# Patient Record
Sex: Male | Born: 1950
Health system: Southern US, Community
[De-identification: ages and names within clinical notes are randomized; demographics above are authoritative.]

## PROBLEM LIST (undated history)

## (undated) DIAGNOSIS — I10 Essential (primary) hypertension: Secondary | ICD-10-CM

## (undated) DIAGNOSIS — I712 Thoracic aortic aneurysm, without rupture, unspecified: Secondary | ICD-10-CM

## (undated) DIAGNOSIS — I38 Endocarditis, valve unspecified: Secondary | ICD-10-CM

## (undated) DIAGNOSIS — R509 Fever, unspecified: Secondary | ICD-10-CM

## (undated) DIAGNOSIS — F32A Depression, unspecified: Secondary | ICD-10-CM

## (undated) DIAGNOSIS — F419 Anxiety disorder, unspecified: Secondary | ICD-10-CM

## (undated) DIAGNOSIS — R0600 Dyspnea, unspecified: Secondary | ICD-10-CM

## (undated) DIAGNOSIS — I219 Acute myocardial infarction, unspecified: Secondary | ICD-10-CM

## (undated) DIAGNOSIS — G4730 Sleep apnea, unspecified: Secondary | ICD-10-CM

## (undated) DIAGNOSIS — I2510 Atherosclerotic heart disease of native coronary artery without angina pectoris: Secondary | ICD-10-CM

## (undated) DIAGNOSIS — N289 Disorder of kidney and ureter, unspecified: Secondary | ICD-10-CM

## (undated) DIAGNOSIS — E119 Type 2 diabetes mellitus without complications: Secondary | ICD-10-CM

## (undated) DIAGNOSIS — M1990 Unspecified osteoarthritis, unspecified site: Secondary | ICD-10-CM

## (undated) DIAGNOSIS — I6529 Occlusion and stenosis of unspecified carotid artery: Secondary | ICD-10-CM

## (undated) HISTORY — DX: Endocarditis, valve unspecified: I38

## (undated) HISTORY — PX: CARDIAC CATHETERIZATION: SHX172

## (undated) HISTORY — PX: NECK SURGERY: SHX720

## (undated) HISTORY — DX: Fever, unspecified: R50.9

## (undated) HISTORY — PX: APPENDECTOMY: SHX54

## (undated) HISTORY — PX: CORONARY ANGIOPLASTY: SHX604

## (undated) HISTORY — PX: ABDOMINAL AORTIC ANEURYSM REPAIR: SUR1152

## (undated) HISTORY — PX: SMALL INTESTINE SURGERY: SHX150

---

## 2002-10-03 ENCOUNTER — Encounter: Payer: Self-pay | Admitting: Neurosurgery

## 2002-10-05 ENCOUNTER — Encounter: Payer: Self-pay | Admitting: Neurosurgery

## 2002-10-05 ENCOUNTER — Inpatient Hospital Stay (HOSPITAL_COMMUNITY): Admission: RE | Admit: 2002-10-05 | Discharge: 2002-10-06 | Payer: Self-pay | Admitting: Neurosurgery

## 2009-06-20 ENCOUNTER — Ambulatory Visit (HOSPITAL_COMMUNITY): Admission: RE | Admit: 2009-06-20 | Discharge: 2009-06-21 | Payer: Self-pay | Admitting: Neurosurgery

## 2011-03-01 LAB — BASIC METABOLIC PANEL
BUN: 41 mg/dL — ABNORMAL HIGH (ref 6–23)
BUN: 42 mg/dL — ABNORMAL HIGH (ref 6–23)
CO2: 26 mEq/L (ref 19–32)
Calcium: 9.5 mg/dL (ref 8.4–10.5)
Chloride: 102 mEq/L (ref 96–112)
Chloride: 106 mEq/L (ref 96–112)
Creatinine, Ser: 2.77 mg/dL — ABNORMAL HIGH (ref 0.4–1.5)
GFR calc Af Amer: 29 mL/min — ABNORMAL LOW (ref >60)
GFR calc non Af Amer: 24 mL/min — ABNORMAL LOW (ref >60)
GFR calc non Af Amer: 24 mL/min — ABNORMAL LOW (ref >60)
Glucose, Bld: 116 mg/dL — ABNORMAL HIGH (ref 70–99)
Glucose, Bld: 309 mg/dL — ABNORMAL HIGH (ref 70–99)
Potassium: 5 mEq/L (ref 3.5–5.1)
Potassium: 5.3 mEq/L — ABNORMAL HIGH (ref 3.5–5.1)
Sodium: 136 mEq/L (ref 135–145)
Sodium: 137 mEq/L (ref 135–145)

## 2011-03-01 LAB — GLUCOSE, CAPILLARY
Glucose-Capillary: 125 mg/dL — ABNORMAL HIGH (ref 70–99)
Glucose-Capillary: 155 mg/dL — ABNORMAL HIGH (ref 70–99)
Glucose-Capillary: 162 mg/dL — ABNORMAL HIGH (ref 70–99)
Glucose-Capillary: 177 mg/dL — ABNORMAL HIGH (ref 70–99)

## 2011-03-01 LAB — CBC
HCT: 42.7 % (ref 39.0–52.0)
Hemoglobin: 14.6 g/dL (ref 13.0–17.0)
MCHC: 34.3 g/dL (ref 30.0–36.0)
MCV: 89.3 fL (ref 78.0–100.0)
Platelets: 202 10*3/uL (ref 150–400)
RBC: 4.78 MIL/uL (ref 4.22–5.81)
RDW: 13 % (ref 11.5–15.5)
WBC: 9.2 10*3/uL (ref 4.0–10.5)

## 2011-03-01 LAB — PROTIME-INR: INR: 0.9 (ref 0.00–1.49)

## 2011-04-07 NOTE — Op Note (Signed)
NAME:  John [name], John [name]             ACCOUNT NO.:  [id number]   MEDICAL RECORD NO.:  [id number]          PATIENT TYPE:  OIB   LOCATION:  3536                         FACILITY:  [facility]   PHYSICIAN:  Otilio Connors, M.D.  DATE OF BIRTH:  [birth year]   DATE OF PROCEDURE:  06/20/2009  DATE OF DISCHARGE:                               OPERATIVE REPORT   PREOPERATIVE DIAGNOSIS:  Herniated nucleus pulposus right 3-4 with large  extruded fragments going cephalad in the canal up under the upper disk.   POSTOPERATIVE DIAGNOSIS:  Herniated nucleus pulposus right 3-4 with  large extruded fragments going cephalad in the canal up under the upper  disk.   PROCEDURES:  Decompressive laminectomy decompressing the L3 and L4 roots  on the right (two levels), diskectomy, microdissection with microscope.   SURGEON:  Otilio Connors, MD   ASSISTANT:  Ophelia Charter, MD   ANESTHESIA:  General endotracheal tube anesthesia.   ESTIMATED BLOOD LOSS:  Minimal.   BLOOD GIVEN:  None.   DRAINS:  None.   COMPLICATIONS:  None.   INDICATIONS FOR PROCEDURE:  The patient is a 60 year old gentleman who  presents with severe back and leg pain, numbness, and weakness with  weakness in the right quadriceps and sensation diminished in the 4  distribution and potentially in the 3, with a lot of pain in 3  distribution.  An MRI was done showing large extruded fragments of disk  herniation from 3-4 levels going cephalad into the foramen and up under  the nerve root all the way up to the pedicle, and the patient brought in  for decompressive laminectomy and diskectomy.   PROCEDURE IN DETAIL:  The patient was brought into the operating room  and general anesthesia was induced.  The patient was placed in prone  position on the Wilson frame with all pressure points padded.  The  patient was prepped and draped in sterile fashion.  Site of incision  injected with 10 mL of 1% lidocaine with epinephrine.  A needle was  placed in the interspace.  X-ray was obtained showing the needle was  pointing at 3-4 interspace and at the 3 spinous process.  An incision  then made centered where the needle was, and incision then taken down  the fascia.  Hemostasis was obtained with Bovie cauterization.  The  fascia was incised and subperiosteal dissection was done over the L3 and  L4 spinous processes and lamina out to the facets.  A self-retaining  retractor was placed and markers were placed in the interspace.  Another  x-ray was obtained ensuring this was at the 3-4 interspace.  I started a  semihemilaminectomy with a high-speed drill and then the Kerrison  punches removing the lamina of 3 and taking it a little higher than the  usual up the lamina.  There was a __________ extending at disk  herniation.  Medial facetectomy was performed and foraminotomy over the  4 roots.  We then explored the dura and the epidural space, found the  disk.  Got hemostasis with bipolar cauterization, Gelfoam, thrombin, and  explored the area.  There was a hole into the annulus out laterally and  we entered that to remove some disk pieces and fragments out laterally  in the disk space, but this was not what we were seeing in the MRI.  We  continued to explore.  I could feel the upper nerve root and it felt a  little tight.  We could put the marker in the interspace.  We took  another x-ray and this did confirm we were at the 3-4 interspace.  We  extended our laminectomy more cephalad which was over the nerve root 3  and as we did this, we could see there was a nerve root was under  compression here.  We were able to feel underneath it with nerve hook  and to pull out a couple of large fragments of disk that were up against  the pedicle and up under the 3 roots causing some significant  compression.  Once we did that, we got significant decompression of 3  nerve roots.  A couple of other fragments that we were able to fish out  and get  the pressure off.  We then continued exploring the foramen and  following the 3 nerve root out and working our way down toward the disk  space removing further fragments.  When we were finished, we had good  decompressive laminectomy decompressing the 3 and 4 roots at the  diskectomy removing the free fragments and the disk at 3-4 area and a  good decompression of 3 and 4 nerve roots on the right side.  Hemostasis  was obtained with Gelfoam, thrombin, and bipolar cauterization.  Gelfoam  was irrigated out.  We had good hemostasis, and after we irrigated with  antibiotic solution, retractors were removed.  Fascia closed with 0  Vicryl interrupted sutures, subcutaneous tissue closed with 0, 2-0, and  3-0 Vicryl interrupted sutures.  Skin closed with benzoin and Steri-  Strips.  A dressing was placed.  The patient was placed back into supine  position, awoken from anesthesia, and transferred to recovery room in  stable condition.           ______________________________  Otilio Connors, M.D.     JRH/MEDQ  D:  06/20/2009  T:  06/21/2009  Job:  [id number]

## 2011-04-10 NOTE — Op Note (Signed)
NAME:  John [name], John [name]                         ACCOUNT NO.:  [id number]   MEDICAL RECORD NO.:  [id number]                   PATIENT TYPE:  INP   LOCATION:  NA                                   FACILITY:  [facility]   PHYSICIAN:  Otilio Connors, M.D.               DATE OF BIRTH:  [birth year]   DATE OF PROCEDURE:  10/05/2002  DATE OF DISCHARGE:                                 OPERATIVE REPORT   PREOPERATIVE DIAGNOSES:  Herniated nucleus pulposus C5-6, left-sided  radiculopathy.   POSTOPERATIVE DIAGNOSES:  Herniated nucleus pulposus C5-6, left-sided  radiculopathy.   OPERATION PERFORMED:  Anterior cervical decompression and diskectomy and  fusion C6-7 with BAK interbody cage and autograft same incision.   SURGEON:  Otilio Connors, M.D.   ASSISTANT:  Ophelia Charter, M.D.   ANESTHESIA:  General endotracheal.   ESTIMATED BLOOD LOSS:  Minimal.   BLOOD REPLACED:  None.   DRAINS:  None.   COMPLICATIONS:  None.   INDICATIONS FOR PROCEDURE:  The patient is a 60 year old gentleman who has  been having neck and left arm pain and numbness.  He can bring these  symptoms easily by tilting his head back and then to the left.  Some of the  severity of the pain has been helped a little bit with steroid dose-pack,  antiinflammatory medications but symptoms persist.  MRI was done showing  large left sided disk herniation at C6-7 with some multiple mild spondylosis  at these levels. He does have some slight weakness in the left triceps and  finger extension which has worsened over some time with decreased sensation  left C7 distribution.  Patient brought in for diskectomy and decompression  and fusion.   DESCRIPTION OF PROCEDURE:  The patient was brought to the operating room and  general anesthesia induced.  Patient was placed in halter traction with 10  pounds and prepped and draped in sterile fashion.  Site of incision was  injected with 10 cc of 1% lidocaine with epinephrine.   Incision was made  from the midline to the anterior border of the sternocleidomastoid muscle on  the left side of the neck.  Incision carried down to the platysma.  Hemostasis obtained with Bovie cauterization.  The platysma was incised with  the Bovie and blunt dissection was taken through the anterior cervical  fascia to the anterior cervical spine.  Disk space was found.  Needle was  placed in the disk space and fluoroscopy was used and it was hard to see  where the  needle was; but it appeared to be going in at the 6-7 level.  We  placed a second needle in the 5-6 level which we could see with fluoroscopic  imaging and coming down the first needle was the 5-6 and the next needle was  one level down and was at 6-7.  We removed the 5-6 needle.  We incised the  6-  7 disk space and then removed that needle and did a partial diskectomy with  pituitary rongeurs.  We then mobilized the longus colli muscle on each side  with the Bovie and placed self-retaining retractor system.  Then placed  distraction pins into the C6 and 7 and distracted the 6-7 interspace.  We  then completed the diskectomy using pituitary rongeurs, curets, and 1 and 2  mm Kerrison punches removing a free fragment of disk that was midline out to  the left foramen and then even fairly far out.  We removed the posterior  longitudinal ligament and did foraminotomies bilaterally and again made sure  pressure was off the bilateral roots especially on the left side and then we  had good decompression.  We used a high speed drill to remove the  cartilaginous end plates.  We then measured the disk space.  It was 9 mm so  we put the 9 mm drill guide down this space for a 12 mm cage and this was  drilled.  We tapped that into place.  We made sure we had good bone on each  side and then we used the reamer and then reamed the interspace checking  depth down to 17 mm.  All the bone from the reaming was saved and placed  into a 12 mm BAK  cage for autograft fusion.  We then tapped the interspace  and made sure we had good grooves in the bones on both sides and good lip on  each side which we did and then we screwed the BAK cage into position.  We  removed the drill guide and then put the cage in its final position about 1  mm countersunk. The wound was irrigated with antibiotic solution, and we  removed the distraction pins and the retractor up to the distraction and the  traction was removed prior to tapping and placing of the cage.  The cage was  firmly in place.  Hemostasis was obtained with Gelfoam thrombin and bipolar  cauterization.  Wound was irrigated with antibiotic solution.  We used  fluoroscopic imaging to confirm that the cage was in the 6-7 level.  It was  hard to see because of the shoulders but in an oblique view we were able  tamp out and then show that.  We had good hemostasis and the platysma was  then closed with 3-0 Vicryl interrupted suture.  The subcutaneous tissues  were closed with the same and skin closed with Benzoin and Steri-Strips.  Dressing was placed. A collar placed over the neck.  The patient was then  awakened from anesthesia and transferred to recovery room in stable  condition.                                               Otilio Connors, M.D.    JRH/MEDQ  D:  10/05/2002  T:  10/05/2002  Job:  [id number]

## 2017-08-19 ENCOUNTER — Other Ambulatory Visit: Payer: Self-pay | Admitting: Neurosurgery

## 2017-08-19 DIAGNOSIS — Z981 Arthrodesis status: Secondary | ICD-10-CM

## 2017-09-06 ENCOUNTER — Encounter (HOSPITAL_COMMUNITY): Payer: Self-pay

## 2017-09-06 ENCOUNTER — Inpatient Hospital Stay (HOSPITAL_COMMUNITY)
Admission: EM | Admit: 2017-09-06 | Discharge: 2017-09-14 | DRG: 289 | Disposition: A | Discharge status: Home Health | Payer: 59 | Attending: Internal Medicine | Admitting: Internal Medicine

## 2017-09-06 ENCOUNTER — Emergency Department (HOSPITAL_COMMUNITY): Payer: 59

## 2017-09-06 DIAGNOSIS — Z79899 Other long term (current) drug therapy: Secondary | ICD-10-CM

## 2017-09-06 DIAGNOSIS — N186 End stage renal disease: Secondary | ICD-10-CM | POA: Diagnosis present

## 2017-09-06 DIAGNOSIS — K5900 Constipation, unspecified: Secondary | ICD-10-CM | POA: Diagnosis present

## 2017-09-06 DIAGNOSIS — R7881 Bacteremia: Secondary | ICD-10-CM | POA: Diagnosis present

## 2017-09-06 DIAGNOSIS — E1129 Type 2 diabetes mellitus with other diabetic kidney complication: Secondary | ICD-10-CM | POA: Diagnosis present

## 2017-09-06 DIAGNOSIS — M5126 Other intervertebral disc displacement, lumbar region: Secondary | ICD-10-CM | POA: Diagnosis present

## 2017-09-06 DIAGNOSIS — M545 Low back pain, unspecified: Secondary | ICD-10-CM | POA: Diagnosis present

## 2017-09-06 DIAGNOSIS — I330 Acute and subacute infective endocarditis: Secondary | ICD-10-CM | POA: Diagnosis not present

## 2017-09-06 DIAGNOSIS — E1122 Type 2 diabetes mellitus with diabetic chronic kidney disease: Secondary | ICD-10-CM | POA: Diagnosis present

## 2017-09-06 DIAGNOSIS — M549 Dorsalgia, unspecified: Secondary | ICD-10-CM | POA: Diagnosis not present

## 2017-09-06 DIAGNOSIS — I5189 Other ill-defined heart diseases: Secondary | ICD-10-CM | POA: Diagnosis present

## 2017-09-06 DIAGNOSIS — E1165 Type 2 diabetes mellitus with hyperglycemia: Secondary | ICD-10-CM | POA: Diagnosis present

## 2017-09-06 DIAGNOSIS — Z23 Encounter for immunization: Secondary | ICD-10-CM

## 2017-09-06 DIAGNOSIS — I714 Abdominal aortic aneurysm, without rupture: Secondary | ICD-10-CM | POA: Diagnosis present

## 2017-09-06 DIAGNOSIS — N179 Acute kidney failure, unspecified: Secondary | ICD-10-CM | POA: Diagnosis present

## 2017-09-06 DIAGNOSIS — M109 Gout, unspecified: Secondary | ICD-10-CM | POA: Diagnosis present

## 2017-09-06 DIAGNOSIS — R000 Tachycardia, unspecified: Secondary | ICD-10-CM | POA: Diagnosis present

## 2017-09-06 DIAGNOSIS — I700 Atherosclerosis of aorta: Secondary | ICD-10-CM | POA: Diagnosis present

## 2017-09-06 DIAGNOSIS — R509 Fever, unspecified: Secondary | ICD-10-CM

## 2017-09-06 DIAGNOSIS — I252 Old myocardial infarction: Secondary | ICD-10-CM

## 2017-09-06 DIAGNOSIS — N184 Chronic kidney disease, stage 4 (severe): Secondary | ICD-10-CM | POA: Diagnosis present

## 2017-09-06 DIAGNOSIS — Z981 Arthrodesis status: Secondary | ICD-10-CM

## 2017-09-06 DIAGNOSIS — E785 Hyperlipidemia, unspecified: Secondary | ICD-10-CM | POA: Diagnosis present

## 2017-09-06 DIAGNOSIS — B9561 Methicillin susceptible Staphylococcus aureus infection as the cause of diseases classified elsewhere: Secondary | ICD-10-CM | POA: Clinically undetermined

## 2017-09-06 DIAGNOSIS — M5442 Lumbago with sciatica, left side: Secondary | ICD-10-CM

## 2017-09-06 DIAGNOSIS — Z880 Allergy status to penicillin: Secondary | ICD-10-CM

## 2017-09-06 DIAGNOSIS — M4690 Unspecified inflammatory spondylopathy, site unspecified: Secondary | ICD-10-CM | POA: Diagnosis present

## 2017-09-06 DIAGNOSIS — M7138 Other bursal cyst, other site: Secondary | ICD-10-CM | POA: Diagnosis present

## 2017-09-06 DIAGNOSIS — I10 Essential (primary) hypertension: Secondary | ICD-10-CM | POA: Diagnosis present

## 2017-09-06 DIAGNOSIS — Z7984 Long term (current) use of oral hypoglycemic drugs: Secondary | ICD-10-CM

## 2017-09-06 DIAGNOSIS — I2510 Atherosclerotic heart disease of native coronary artery without angina pectoris: Secondary | ICD-10-CM | POA: Diagnosis present

## 2017-09-06 DIAGNOSIS — Z87891 Personal history of nicotine dependence: Secondary | ICD-10-CM

## 2017-09-06 DIAGNOSIS — I080 Rheumatic disorders of both mitral and aortic valves: Secondary | ICD-10-CM | POA: Diagnosis present

## 2017-09-06 DIAGNOSIS — Z955 Presence of coronary angioplasty implant and graft: Secondary | ICD-10-CM

## 2017-09-06 DIAGNOSIS — I129 Hypertensive chronic kidney disease with stage 1 through stage 4 chronic kidney disease, or unspecified chronic kidney disease: Secondary | ICD-10-CM | POA: Diagnosis present

## 2017-09-06 DIAGNOSIS — N189 Chronic kidney disease, unspecified: Secondary | ICD-10-CM

## 2017-09-06 HISTORY — DX: Disorder of kidney and ureter, unspecified: N28.9

## 2017-09-06 HISTORY — DX: Type 2 diabetes mellitus without complications: E11.9

## 2017-09-06 HISTORY — DX: Essential (primary) hypertension: I10

## 2017-09-06 HISTORY — DX: Acute myocardial infarction, unspecified: I21.9

## 2017-09-06 LAB — COMPREHENSIVE METABOLIC PANEL
ALBUMIN: 3.5 g/dL (ref 3.5–5.0)
ALT: 20 U/L (ref 17–63)
ANION GAP: 12 (ref 5–15)
AST: 15 U/L (ref 15–41)
Alkaline Phosphatase: 80 U/L (ref 38–126)
BILIRUBIN TOTAL: 0.6 mg/dL (ref 0.3–1.2)
BUN: 48 mg/dL — AB (ref 6–20)
CHLORIDE: 98 mmol/L — AB (ref 101–111)
CO2: 21 mmol/L — ABNORMAL LOW (ref 22–32)
Calcium: 9.2 mg/dL (ref 8.9–10.3)
Creatinine, Ser: 4.02 mg/dL — ABNORMAL HIGH (ref 0.61–1.24)
GFR calc Af Amer: 17 mL/min — ABNORMAL LOW (ref >60)
GFR, EST NON AFRICAN AMERICAN: 15 mL/min — AB (ref >60)
GLUCOSE: 193 mg/dL — AB (ref 65–99)
POTASSIUM: 4.2 mmol/L (ref 3.5–5.1)
Sodium: 131 mmol/L — ABNORMAL LOW (ref 135–145)
TOTAL PROTEIN: 7.3 g/dL (ref 6.5–8.1)

## 2017-09-06 LAB — CBC WITH DIFFERENTIAL/PLATELET
BASOS ABS: 0 10*3/uL (ref 0.0–0.1)
BASOS PCT: 0 %
Eosinophils Absolute: 0 10*3/uL (ref 0.0–0.7)
Eosinophils Relative: 0 %
HEMATOCRIT: 38.7 % — AB (ref 39.0–52.0)
Hemoglobin: 13.2 g/dL (ref 13.0–17.0)
LYMPHS PCT: 10 %
Lymphs Abs: 1.2 10*3/uL (ref 0.7–4.0)
MCH: 30.5 pg (ref 26.0–34.0)
MCHC: 34.1 g/dL (ref 30.0–36.0)
MCV: 89.4 fL (ref 78.0–100.0)
MONO ABS: 1.3 10*3/uL — AB (ref 0.1–1.0)
Monocytes Relative: 12 %
NEUTROS ABS: 8.8 10*3/uL — AB (ref 1.7–7.7)
NEUTROS PCT: 78 %
Platelets: 179 10*3/uL (ref 150–400)
RBC: 4.33 MIL/uL (ref 4.22–5.81)
RDW: 13.3 % (ref 11.5–15.5)
WBC: 11.3 10*3/uL — ABNORMAL HIGH (ref 4.0–10.5)

## 2017-09-06 LAB — URINALYSIS, ROUTINE W REFLEX MICROSCOPIC
Bacteria, UA: NONE SEEN
Bilirubin Urine: NEGATIVE
HGB URINE DIPSTICK: NEGATIVE
KETONES UR: NEGATIVE mg/dL
LEUKOCYTES UA: NEGATIVE
NITRITE: NEGATIVE
PROTEIN: 100 mg/dL — AB
Specific Gravity, Urine: 1.01 (ref 1.005–1.030)
pH: 6 (ref 5.0–8.0)

## 2017-09-06 LAB — C-REACTIVE PROTEIN: CRP: 18.7 mg/dL — ABNORMAL HIGH (ref <1.0)

## 2017-09-06 LAB — PROTIME-INR
INR: 1.02
Prothrombin Time: 13.3 seconds (ref 11.4–15.2)

## 2017-09-06 LAB — I-STAT CG4 LACTIC ACID, ED: LACTIC ACID, VENOUS: 1.02 mmol/L (ref 0.5–1.9)

## 2017-09-06 LAB — SEDIMENTATION RATE: SED RATE: 53 mm/h — AB (ref 0–16)

## 2017-09-06 MED ORDER — HYDROMORPHONE HCL 1 MG/ML IJ SOLN
0.5000 mg | Freq: Once | INTRAMUSCULAR | Status: AC
  Administered 2017-09-06: 0.5 mg via INTRAVENOUS
  Filled 2017-09-06: qty 1

## 2017-09-06 MED ORDER — ACETAMINOPHEN 325 MG PO TABS
650.0000 mg | ORAL_TABLET | Freq: Once | ORAL | Status: AC
  Administered 2017-09-06: 650 mg via ORAL
  Filled 2017-09-06: qty 2

## 2017-09-06 MED ORDER — ACETAMINOPHEN 325 MG PO TABS: 650.0000 mg | ORAL_TABLET | Freq: Once | ORAL | Status: DC | PRN

## 2017-09-06 MED ORDER — ONDANSETRON HCL 4 MG/2ML IJ SOLN
4.0000 mg | Freq: Once | INTRAMUSCULAR | Status: AC
  Administered 2017-09-06: 4 mg via INTRAVENOUS
  Filled 2017-09-06: qty 2

## 2017-09-06 MED ORDER — OXYCODONE-ACETAMINOPHEN 5-325 MG PO TABS
2.0000 | ORAL_TABLET | ORAL | Status: DC | PRN
  Administered 2017-09-06: 2 via ORAL

## 2017-09-06 MED ORDER — OXYCODONE-ACETAMINOPHEN 5-325 MG PO TABS
ORAL_TABLET | ORAL | Status: AC
  Filled 2017-09-06: qty 2

## 2017-09-06 NOTE — ED Triage Notes (Addendum)
Pt reports left sided lower back pain that radiates in left leg, denies any numbness tingling or loss of bowel of bladder. Pt has has surgery on back and upper neck. Lower back surgery was last sept.  Pt also reports renal failure.

## 2017-09-06 NOTE — ED Notes (Signed)
ED Provider at bedside. 

## 2017-09-06 NOTE — ED Notes (Signed)
Pt ambulated to the bathroom with 1x assist
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
## 2017-09-06 NOTE — ED Notes (Signed)
Patient transported to MRI. Pt states he woke up sweating after taking tylenol with fever breaking.

## 2017-09-06 NOTE — ED Notes (Signed)
edp made aware of temp.
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
## 2017-09-07 ENCOUNTER — Encounter (HOSPITAL_COMMUNITY): Payer: Self-pay | Admitting: Internal Medicine

## 2017-09-07 ENCOUNTER — Observation Stay (HOSPITAL_COMMUNITY): Payer: 59

## 2017-09-07 DIAGNOSIS — E1129 Type 2 diabetes mellitus with other diabetic kidney complication: Secondary | ICD-10-CM

## 2017-09-07 DIAGNOSIS — E785 Hyperlipidemia, unspecified: Secondary | ICD-10-CM | POA: Diagnosis present

## 2017-09-07 DIAGNOSIS — R509 Fever, unspecified: Secondary | ICD-10-CM | POA: Diagnosis not present

## 2017-09-07 DIAGNOSIS — E1121 Type 2 diabetes mellitus with diabetic nephropathy: Secondary | ICD-10-CM | POA: Diagnosis not present

## 2017-09-07 DIAGNOSIS — N189 Chronic kidney disease, unspecified: Secondary | ICD-10-CM

## 2017-09-07 DIAGNOSIS — N179 Acute kidney failure, unspecified: Secondary | ICD-10-CM | POA: Diagnosis present

## 2017-09-07 DIAGNOSIS — M549 Dorsalgia, unspecified: Secondary | ICD-10-CM

## 2017-09-07 DIAGNOSIS — N186 End stage renal disease: Secondary | ICD-10-CM | POA: Diagnosis present

## 2017-09-07 DIAGNOSIS — M545 Low back pain, unspecified: Secondary | ICD-10-CM | POA: Diagnosis present

## 2017-09-07 DIAGNOSIS — I10 Essential (primary) hypertension: Secondary | ICD-10-CM

## 2017-09-07 HISTORY — DX: Low back pain, unspecified: M54.50

## 2017-09-07 HISTORY — DX: End stage renal disease: N18.6

## 2017-09-07 HISTORY — DX: Type 2 diabetes mellitus with other diabetic kidney complication: E11.29

## 2017-09-07 HISTORY — DX: Dorsalgia, unspecified: M54.9

## 2017-09-07 HISTORY — DX: Essential (primary) hypertension: I10

## 2017-09-07 HISTORY — DX: Hyperlipidemia, unspecified: E78.5

## 2017-09-07 LAB — HIV ANTIBODY (ROUTINE TESTING W REFLEX): HIV Screen 4th Generation wRfx: NONREACTIVE

## 2017-09-07 LAB — URIC ACID: Uric Acid, Serum: 10.6 mg/dL — ABNORMAL HIGH (ref 4.4–7.6)

## 2017-09-07 LAB — BASIC METABOLIC PANEL
ANION GAP: 11 (ref 5–15)
BUN: 54 mg/dL — ABNORMAL HIGH (ref 6–20)
CO2: 21 mmol/L — AB (ref 22–32)
Calcium: 9 mg/dL (ref 8.9–10.3)
Chloride: 101 mmol/L (ref 101–111)
Creatinine, Ser: 4.15 mg/dL — ABNORMAL HIGH (ref 0.61–1.24)
GFR calc Af Amer: 16 mL/min — ABNORMAL LOW (ref >60)
GFR, EST NON AFRICAN AMERICAN: 14 mL/min — AB (ref >60)
GLUCOSE: 212 mg/dL — AB (ref 65–99)
POTASSIUM: 4.2 mmol/L (ref 3.5–5.1)
Sodium: 133 mmol/L — ABNORMAL LOW (ref 135–145)

## 2017-09-07 LAB — CK: CK TOTAL: 51 U/L (ref 49–397)

## 2017-09-07 LAB — INFLUENZA PANEL BY PCR (TYPE A & B)
Influenza A By PCR: NEGATIVE
Influenza B By PCR: NEGATIVE

## 2017-09-07 LAB — CBC
HEMATOCRIT: 36.6 % — AB (ref 39.0–52.0)
HEMOGLOBIN: 12 g/dL — AB (ref 13.0–17.0)
MCH: 29.3 pg (ref 26.0–34.0)
MCHC: 32.8 g/dL (ref 30.0–36.0)
MCV: 89.3 fL (ref 78.0–100.0)
Platelets: 162 10*3/uL (ref 150–400)
RBC: 4.1 MIL/uL — ABNORMAL LOW (ref 4.22–5.81)
RDW: 13.6 % (ref 11.5–15.5)
WBC: 8.5 10*3/uL (ref 4.0–10.5)

## 2017-09-07 MED ORDER — SODIUM CHLORIDE 0.9 % IV SOLN: INTRAVENOUS | Status: DC

## 2017-09-07 MED ORDER — ACETAMINOPHEN 650 MG RE SUPP: 650.0000 mg | Freq: Four times a day (QID) | RECTAL | Status: DC | PRN

## 2017-09-07 MED ORDER — LINAGLIPTIN 5 MG PO TABS
5.0000 mg | ORAL_TABLET | Freq: Every day | ORAL | Status: DC
  Administered 2017-09-07 – 2017-09-09 (×3): 5 mg via ORAL
  Filled 2017-09-07 (×4): qty 1

## 2017-09-07 MED ORDER — COLCHICINE 0.6 MG PO TABS
0.6000 mg | ORAL_TABLET | Freq: Every day | ORAL | Status: DC
  Administered 2017-09-07 – 2017-09-08 (×2): 0.6 mg via ORAL
  Filled 2017-09-07 (×3): qty 1

## 2017-09-07 MED ORDER — MORPHINE SULFATE (PF) 4 MG/ML IV SOLN
2.0000 mg | INTRAVENOUS | Status: DC | PRN
  Administered 2017-09-07 – 2017-09-12 (×5): 2 mg via INTRAVENOUS
  Filled 2017-09-07 (×5): qty 1

## 2017-09-07 MED ORDER — AMLODIPINE BESYLATE 5 MG PO TABS
5.0000 mg | ORAL_TABLET | Freq: Every day | ORAL | Status: DC
  Administered 2017-09-07 – 2017-09-14 (×7): 5 mg via ORAL
  Filled 2017-09-07 (×7): qty 1

## 2017-09-07 MED ORDER — ACETAMINOPHEN 325 MG PO TABS
650.0000 mg | ORAL_TABLET | Freq: Four times a day (QID) | ORAL | Status: DC | PRN
  Administered 2017-09-07: 650 mg via ORAL
  Filled 2017-09-07 (×2): qty 2

## 2017-09-07 MED ORDER — METOPROLOL SUCCINATE ER 50 MG PO TB24
50.0000 mg | ORAL_TABLET | Freq: Every day | ORAL | Status: DC
  Administered 2017-09-07 – 2017-09-14 (×7): 50 mg via ORAL
  Filled 2017-09-07: qty 1
  Filled 2017-09-07: qty 2
  Filled 2017-09-07 (×5): qty 1

## 2017-09-07 MED ORDER — INFLUENZA VAC SPLIT QUAD 0.5 ML IM SUSY
0.5000 mL | PREFILLED_SYRINGE | INTRAMUSCULAR | Status: AC
  Administered 2017-09-08: 0.5 mL via INTRAMUSCULAR
  Filled 2017-09-07: qty 0.5

## 2017-09-07 MED ORDER — ONDANSETRON HCL 4 MG PO TABS
4.0000 mg | ORAL_TABLET | Freq: Four times a day (QID) | ORAL | Status: DC | PRN
  Administered 2017-09-07: 4 mg via ORAL
  Filled 2017-09-07: qty 1

## 2017-09-07 MED ORDER — DULAGLUTIDE 1.5 MG/0.5ML ~~LOC~~ SOAJ: 1.5000 mg | SUBCUTANEOUS | Status: DC

## 2017-09-07 MED ORDER — TAMSULOSIN HCL 0.4 MG PO CAPS
0.4000 mg | ORAL_CAPSULE | Freq: Every day | ORAL | Status: DC
  Administered 2017-09-07 – 2017-09-13 (×7): 0.4 mg via ORAL
  Filled 2017-09-07 (×8): qty 1

## 2017-09-07 MED ORDER — ONDANSETRON HCL 4 MG/2ML IJ SOLN: 4.0000 mg | Freq: Four times a day (QID) | INTRAMUSCULAR | Status: DC | PRN

## 2017-09-07 MED ORDER — GABAPENTIN 300 MG PO CAPS
300.0000 mg | ORAL_CAPSULE | Freq: Three times a day (TID) | ORAL | Status: DC
  Administered 2017-09-07 – 2017-09-13 (×16): 300 mg via ORAL
  Filled 2017-09-07 (×18): qty 1

## 2017-09-07 MED ORDER — OXYCODONE-ACETAMINOPHEN 5-325 MG PO TABS
1.0000 | ORAL_TABLET | Freq: Four times a day (QID) | ORAL | Status: DC | PRN
  Administered 2017-09-07 – 2017-09-14 (×12): 1 via ORAL
  Filled 2017-09-07 (×13): qty 1

## 2017-09-07 MED ORDER — CALCITRIOL 0.25 MCG PO CAPS
0.5000 ug | ORAL_CAPSULE | Freq: Every day | ORAL | Status: DC
  Administered 2017-09-07 – 2017-09-14 (×7): 0.5 ug via ORAL
  Filled 2017-09-07 (×5): qty 2
  Filled 2017-09-07 (×2): qty 1
  Filled 2017-09-07: qty 2

## 2017-09-07 MED ORDER — ASPIRIN EC 81 MG PO TBEC
81.0000 mg | DELAYED_RELEASE_TABLET | Freq: Every day | ORAL | Status: DC
  Administered 2017-09-07 – 2017-09-14 (×7): 81 mg via ORAL
  Filled 2017-09-07 (×7): qty 1

## 2017-09-07 MED ORDER — SODIUM CHLORIDE 0.9 % IV SOLN
INTRAVENOUS | Status: AC
  Administered 2017-09-07: 14:00:00 via INTRAVENOUS

## 2017-09-07 MED ORDER — SIMVASTATIN 40 MG PO TABS
40.0000 mg | ORAL_TABLET | Freq: Every day | ORAL | Status: DC
  Administered 2017-09-07 – 2017-09-14 (×7): 40 mg via ORAL
  Filled 2017-09-07 (×7): qty 1

## 2017-09-07 MED ORDER — MORPHINE SULFATE (PF) 4 MG/ML IV SOLN
1.0000 mg | INTRAVENOUS | Status: DC | PRN
  Administered 2017-09-07 (×2): 1 mg via INTRAVENOUS
  Filled 2017-09-07 (×2): qty 1

## 2017-09-07 NOTE — ED Notes (Signed)
Pt assisted w/lying down on bed - blankets given as requested.

## 2017-09-07 NOTE — ED Notes (Addendum)
Pt sat up in bed to use urinal. Pt noted to be moaning during any movement. When asked location of pain, states "It's in my hip, I guess." Pt ate few bites of breakfast - states is unable to eat anymore d/t no appetite and food did not taste good. Offered to order another tray for pt, declined. Pt continues to moan and grimace. Pt aware of delay w/med-surg bed placement.

## 2017-09-07 NOTE — ED Notes (Signed)
Attempted to call report. Secretary advised RN will call back. 

## 2017-09-07 NOTE — Consult Note (Signed)
[name]         Reason for Consult: back pain   Referring Physician: Daleen Bo  Principal Problem:   Low back pain Active Problems:   Acute on chronic kidney failure (HCC)   DM (diabetes mellitus), type 2 with renal complications (HCC)   Essential hypertension   HLD (hyperlipidemia)   Back pain    HPI: John [name] is a 66 y.o. male  with history of CAD s/p pci, hypertension, diabetes mellitus type 2, chronic kidney disease cr 4.1-followed by Dr Florene Glen, also hx of cervical spine fusion and lumbar fusion roughly 8 years ago.  admitted on 10/15 with worsening low back pain x 5 days  After attempting to carry heavy glass table and strained his back.he also has started to develop fevers the following morning which has not subsided. And notices pain radiating down left leg. . No recent trauma or falls. No sick contacts. In the ED, his labs showed wbc of 11, and temp of 102F. He underwent mri of lumbar spine which showed disc extrusion at L4-L5, and edema at the paraspinal region. Interestingly he has history of gout, uric acid elevated at 10.6. Due to worsening back pain, he presented to the ED for evaluation.  Past Medical History:  Diagnosis Date  . Diabetes mellitus without complication ([place])   . Hypertension   . MI (myocardial infarction) ([place])   . Renal disorder     Allergies:  Allergies  Allergen Reactions  . Penicillins Rash    Has patient had a PCN reaction causing immediate rash, facial/tongue/throat swelling, SOB or lightheadedness with hypotension: Yes Has patient had a PCN reaction causing severe rash involving mucus membranes or skin necrosis: No Has patient had a PCN reaction that required hospitalization: No Has patient had a PCN reaction occurring within the last 10 years: No If all of the above answers are "NO", then may proceed with Cephalosporin use.    MEDICATIONS: . amLODipine  5 mg Oral Daily  . aspirin EC  81 mg Oral Daily    . calcitRIOL  0.5 mcg Oral Daily  . colchicine  0.6 mg Oral Daily  . [START ON 09/12/2017] Dulaglutide  1.5 mg Subcutaneous Q Sun  . gabapentin  300 mg Oral TID  . linagliptin  5 mg Oral Daily  . metoprolol succinate  50 mg Oral Daily  . simvastatin  40 mg Oral Daily  . tamsulosin  0.4 mg Oral QAC supper    Social History  Substance Use Topics  . Smoking status: Former Research scientist (life sciences)  . Smokeless tobacco: Never Used  . Alcohol use Yes     Comment: Occasionally.    Family History  Problem Relation Age of Onset  . Cancer Maternal Grandmother     Review of Systems  Constitutional: Negative for fever, chills, diaphoresis, activity change, appetite change, fatigue and unexpected weight change.  HENT: Negative for congestion, sore throat, rhinorrhea, sneezing, trouble swallowing and sinus pressure.  Eyes: Negative for photophobia and visual disturbance.  Respiratory: Negative for cough, chest tightness, shortness of breath, wheezing and stridor.  Cardiovascular: Negative for chest pain, palpitations and leg swelling.  Gastrointestinal: Negative for nausea, vomiting, abdominal pain, diarrhea, constipation, blood in stool, abdominal distention and anal bleeding.  Genitourinary: Negative for dysuria, hematuria, flank pain and difficulty urinating.  Musculoskeletal: + low back pain Skin: Negative for color change, pallor, rash and wound.  Neurological: Negative for dizziness, tremors, weakness and light-headedness.  Hematological: Negative for  adenopathy. Does not bruise/bleed easily.  Psychiatric/Behavioral: Negative for behavioral problems, confusion, sleep disturbance, dysphoric mood, decreased concentration and agitation.      OBJECTIVE: Temp:  [98.9 F (37.2 C)-102.6 F (39.2 C)] 98.9 F (37.2 C) (10/16 1106) Pulse Rate:  [80-105] 80 (10/16 1106) Resp:  [12-20] 16 (10/16 1106) BP: (125-162)/(75-97) 134/82 (10/16 1106) SpO2:  [93 %-99 %] 99 % (10/16 1106) Physical Exam   Constitutional: He is oriented to person, place, and time. He appears well-developed and well-nourished. No distress.  HENT:  Mouth/Throat: Oropharynx is clear and moist. No oropharyngeal exudate.  Cardiovascular: Normal rate, regular rhythm and normal heart sounds. Exam reveals no gallop and no friction rub.  No murmur heard.  Pulmonary/Chest: Effort normal and breath sounds normal. No respiratory distress. He has no wheezes.  Abdominal: Soft. Bowel sounds are normal. He exhibits no distension. There is no tenderness.  Back: lumbar pain Lymphadenopathy:  He has no cervical adenopathy.  Neurological: He is alert and oriented to person, place, and time.  Skin: Skin is warm and dry. No rash noted. No erythema.  Psychiatric: He has a normal mood and affect. His behavior is normal.     LABS: Results for orders placed or performed during the hospital encounter of 09/06/17 (from the past 48 hour(s))  Comprehensive metabolic panel     Status: Abnormal   Collection Time: 09/06/17 12:45 PM  Result Value Ref Range   Sodium 131 (L) 135 - 145 mmol/L   Potassium 4.2 3.5 - 5.1 mmol/L   Chloride 98 (L) 101 - 111 mmol/L   CO2 21 (L) 22 - 32 mmol/L   Glucose, Bld 193 (H) 65 - 99 mg/dL   BUN 48 (H) 6 - 20 mg/dL   Creatinine, Ser 4.02 (H) 0.61 - 1.24 mg/dL   Calcium 9.2 8.9 - 10.3 mg/dL   Total Protein 7.3 6.5 - 8.1 g/dL   Albumin 3.5 3.5 - 5.0 g/dL   AST 15 15 - 41 U/L   ALT 20 17 - 63 U/L   Alkaline Phosphatase 80 38 - 126 U/L   Total Bilirubin 0.6 0.3 - 1.2 mg/dL   GFR calc non Af Amer 15 (L) >60 mL/min   GFR calc Af Amer 17 (L) >60 mL/min    Comment: (NOTE) The eGFR has been calculated using the CKD EPI equation. This calculation has not been validated in all clinical situations. eGFR's persistently <60 mL/min signify possible Chronic Kidney Disease.    Anion gap 12 5 - 15  CBC with Differential     Status: Abnormal   Collection Time: 09/06/17 12:45 PM  Result Value Ref Range   WBC  11.3 (H) 4.0 - 10.5 K/uL   RBC 4.33 4.22 - 5.81 MIL/uL   Hemoglobin 13.2 13.0 - 17.0 g/dL   HCT 38.7 (L) 39.0 - 52.0 %   MCV 89.4 78.0 - 100.0 fL   MCH 30.5 26.0 - 34.0 pg   MCHC 34.1 30.0 - 36.0 g/dL   RDW 13.3 11.5 - 15.5 %   Platelets 179 150 - 400 K/uL   Neutrophils Relative % 78 %   Neutro Abs 8.8 (H) 1.7 - 7.7 K/uL   Lymphocytes Relative 10 %   Lymphs Abs 1.2 0.7 - 4.0 K/uL   Monocytes Relative 12 %   Monocytes Absolute 1.3 (H) 0.1 - 1.0 K/uL   Eosinophils Relative 0 %   Eosinophils Absolute 0.0 0.0 - 0.7 K/uL   Basophils Relative 0 %   Basophils  Absolute 0.0 0.0 - 0.1 K/uL  Protime-INR     Status: None   Collection Time: 09/06/17 12:45 PM  Result Value Ref Range   Prothrombin Time 13.3 11.4 - 15.2 seconds   INR 1.02   Urinalysis, Routine w reflex microscopic     Status: Abnormal   Collection Time: 09/06/17 12:46 PM  Result Value Ref Range   Color, Urine STRAW (A) YELLOW   APPearance CLEAR CLEAR   Specific Gravity, Urine 1.010 1.005 - 1.030   pH 6.0 5.0 - 8.0   Glucose, UA >=500 (A) NEGATIVE mg/dL   Hgb urine dipstick NEGATIVE NEGATIVE   Bilirubin Urine NEGATIVE NEGATIVE   Ketones, ur NEGATIVE NEGATIVE mg/dL   Protein, ur 100 (A) NEGATIVE mg/dL   Nitrite NEGATIVE NEGATIVE   Leukocytes, UA NEGATIVE NEGATIVE   RBC / HPF 0-5 0 - 5 RBC/hpf   WBC, UA 0-5 0 - 5 WBC/hpf   Bacteria, UA NONE SEEN NONE SEEN   Squamous Epithelial / LPF 0-5 (A) NONE SEEN  Sedimentation rate     Status: Abnormal   Collection Time: 09/06/17  6:33 PM  Result Value Ref Range   Sed Rate 53 (H) 0 - 16 mm/hr  C-reactive protein     Status: Abnormal   Collection Time: 09/06/17  6:33 PM  Result Value Ref Range   CRP 18.7 (H) <1.0 mg/dL  I-Stat CG4 Lactic Acid, ED     Status: None   Collection Time: 09/06/17  6:47 PM  Result Value Ref Range   Lactic Acid, Venous 1.02 0.5 - 1.9 mmol/L  Influenza panel by PCR (type A & B)     Status: None   Collection Time: 09/07/17 12:18 AM  Result Value Ref  Range   Influenza A By PCR NEGATIVE NEGATIVE   Influenza B By PCR NEGATIVE NEGATIVE    Comment: (NOTE) The Xpert Xpress Flu assay is intended as an aid in the diagnosis of  influenza and should not be used as a sole basis for treatment.  This  assay is FDA approved for nasopharyngeal swab specimens only. Nasal  washings and aspirates are unacceptable for Xpert Xpress Flu testing.   Basic metabolic panel     Status: Abnormal   Collection Time: 09/07/17  6:29 AM  Result Value Ref Range   Sodium 133 (L) 135 - 145 mmol/L   Potassium 4.2 3.5 - 5.1 mmol/L   Chloride 101 101 - 111 mmol/L   CO2 21 (L) 22 - 32 mmol/L   Glucose, Bld 212 (H) 65 - 99 mg/dL   BUN 54 (H) 6 - 20 mg/dL   Creatinine, Ser 4.15 (H) 0.61 - 1.24 mg/dL   Calcium 9.0 8.9 - 10.3 mg/dL   GFR calc non Af Amer 14 (L) >60 mL/min   GFR calc Af Amer 16 (L) >60 mL/min    Comment: (NOTE) The eGFR has been calculated using the CKD EPI equation. This calculation has not been validated in all clinical situations. eGFR's persistently <60 mL/min signify possible Chronic Kidney Disease.    Anion gap 11 5 - 15  CBC     Status: Abnormal   Collection Time: 09/07/17  6:29 AM  Result Value Ref Range   WBC 8.5 4.0 - 10.5 K/uL   RBC 4.10 (L) 4.22 - 5.81 MIL/uL   Hemoglobin 12.0 (L) 13.0 - 17.0 g/dL   HCT 36.6 (L) 39.0 - 52.0 %   MCV 89.3 78.0 - 100.0 fL   MCH 29.3 26.0 -  34.0 pg   MCHC 32.8 30.0 - 36.0 g/dL   RDW 13.6 11.5 - 15.5 %   Platelets 162 150 - 400 K/uL  CK     Status: None   Collection Time: 09/07/17  6:29 AM  Result Value Ref Range   Total CK 51 49 - 397 U/L  Uric acid     Status: Abnormal   Collection Time: 09/07/17  6:29 AM  Result Value Ref Range   Uric Acid, Serum 10.6 (H) 4.4 - 7.6 mg/dL    MICRO: pending IMAGING: Dg Chest 2 View  Result Date: 09/06/2017 CLINICAL DATA:  66 y/o  M; fever and lower back pain. EXAM: CHEST  2 VIEW COMPARISON:  06/18/2009 chest radiograph FINDINGS: Stable normal cardiac  silhouette given projection and technique. Aortic atherosclerosis with calcification. Clear lungs. No pleural effusion or pneumothorax. Multilevel discogenic degenerative changes of the thoracic spine. IMPRESSION: No active cardiopulmonary disease.  Aortic atherosclerosis. Electronically Signed   By: Kristine Garbe M.D.   On: 09/06/2017 20:23   Mr Lumbar Spine Wo Contrast  Addendum Date: 09/06/2017   ADDENDUM REPORT: 09/06/2017 23:50 ADDENDUM: In addition to the initially described findings, there is a tiny left subarticular disc extrusion with superior migration at L4-5 (series 2, image 9 on sagittal sequence, series 5, image 22 on axial sequence). Superior migration extends approximately 8 mm superior to the parent L4-5 interspace. This could also potentially affect the descending left L4 nerve root. Additionally, soft tissue edema noted within the lower posterior paraspinous musculature, slightly worse on the right, most likely reactive in nature due to the bilateral L4-5 facet degeneration. A component of superimposed muscular strain/injury could also be present. Electronically Signed   By: Jeannine Boga M.D.   On: 09/06/2017 23:50   Result Date: 09/06/2017 CLINICAL DATA:  Initial evaluation for left-sided lower back pain radiating into left leg. EXAM: MRI LUMBAR SPINE WITHOUT CONTRAST TECHNIQUE: Multiplanar, multisequence MR imaging of the lumbar spine was performed. No intravenous contrast was administered. COMPARISON:  Prior radiographs from 08/04/2016 as well as previous MRI from 05/29/2009. FINDINGS: Segmentation: Normal segmentation. Lowest well-formed disc labeled the L5-S1 level. Alignment: Trace 3 mm anterolisthesis of L4 on L5. Vertebral bodies otherwise normally aligned with preservation of the normal lumbar lordosis. Vertebrae: Vertebral body heights are maintained. No evidence for acute or chronic fracture. Chronic endplate Schmorl's nodes present at the superior endplates  of L2 and L3. Mild reactive endplate changes about the L3-4 interspace. No discrete or worrisome osseous lesions. Mild reactive edema about the bilateral L4-5 facets due to facet degeneration. Conus medullaris: Extends to the L1 level and appears normal. Paraspinal and other soft tissues: Paraspinous soft tissues demonstrate no acute abnormality. Multiple T2 hyperintense cyst noted within the kidneys bilaterally. Aortic atherosclerosis with associated intra-abdominal aneurysm measuring up to 4.1 cm in size. Disc levels: L1-2: Disc desiccation without significant disc bulge. Small annular fissure noted at the level of the left neural foramen. Mild facet and ligament flavum hypertrophy. No stenosis. L2-3: Diffuse disc bulge with disc desiccation and intervertebral disc space narrowing. Superimposed broad right subarticular disc protrusion indents the right ventral thecal sac (series 5, image 12). Moderate facet and ligamentum flavum hypertrophy. Resultant mild right lateral recess and canal stenosis without neural impingement. No significant foraminal encroachment. L3-4: Diffuse disc bulge with disc desiccation and intervertebral disc space narrowing. Disc bulge a centric to the right with associated right far lateral reactive endplate changes with marginal endplate spurring. Advanced bilateral facet arthrosis, right  worse than left. Patient appears to be status post partial right hemi laminectomy at this level. Residual moderate right lateral recess narrowing, potentially affecting the descending right L4 nerve root (series 5, image 18). Severe right L3 foraminal stenosis (series 4, image 5). Central canal remains widely patent. Relatively mild left foraminal narrowing. L4-5: 3 mm anterolisthesis. Associated mild diffuse disc bulge with disc desiccation. Advanced bilateral facet arthrosis with associated reactive effusions within the bilateral L4-5 facets. There is a prominent 18 mm synovial cyst at the posterior  margin of the left L4-5 facet (series 5, image 27). This does not exert mass effect. Resultant mild canal with moderate bilateral lateral recess narrowing, potentially affecting either of the descending L5 nerve roots. Moderate bilateral foraminal stenosis, left greater than right. L5-S1: Disc desiccation with mild annular disc bulge. Advanced left-sided facet arthrosis. No significant canal or foraminal stenosis. IMPRESSION: 1. 3 mm anterolisthesis of L4 on L5 with associated disc bulge and advanced facet arthropathy, resulting in moderate bilateral foraminal and lateral recess stenosis. Either of the L4 or descending L5 nerve roots could be affected. 2. Right eccentric disc bulge with advanced right-sided facet arthrosis at L3-4, resulting in moderate right lateral recess and severe foraminal stenosis. Either of right L3 or L4 nerve roots could be affected. 3. Reactive marrow edema about the bilateral L4-5 facets due to facet arthritis. Finding could serve as a source for lower back pain. 4. Atherosclerosis with associated intra- abdominal aneurysm measuring up to 4.2 cm. Recommend followup by ultrasound in 1 year. This recommendation follows ACR consensus guidelines: White Paper of the ACR Incidental Findings Committee II on Vascular Findings. J Am Coll Radiol 2013; 10:789-794. Electronically Signed: By: Jeannine Boga M.D. On: 09/06/2017 23:30   Ct Hip Left Wo Contrast  Result Date: 09/07/2017 CLINICAL DATA:  Left hip pain radiating from left lower back following heavy lifting 2 days ago. EXAM: CT OF THE LEFT HIP WITHOUT CONTRAST TECHNIQUE: Multidetector CT imaging of the left hip was performed according to the standard protocol. Multiplanar CT image reconstructions were also generated. COMPARISON:  01/14/2009 CT abdomen and pelvis FINDINGS: Bones/Joint/Cartilage Bony bridging across the left SI joint consistent with osteoarthritis. No acute fracture of the left hip and included pelvis. Degenerative  subchondral cystic changes are noted of the left acetabular roof, more so posteriorly. No suspicious osseous lesions. Mild chondral thinning of the left hip joint. No joint effusion. Ligaments Suboptimally assessed by CT. Muscles and Tendons No intramuscular hemorrhage or atrophy. Soft tissues Included urinary bladder is physiologically distended. Normal size prostate with peripheral zone calcifications. Partially visualized left bi-iliac vascular grafts. IMPRESSION: Osteoarthritis of the left hip and SI joints. No acute nor suspicious osseous abnormality. Electronically Signed   By: Ashley Royalty M.D.   On: 09/07/2017 03:30   Assessment/Plan:  65yoM with history of cervical and lumbar disc disease s/p remote fusion who is admitted for 5 day history of back pain with fevers. Imaging suggests arthritis, no overt fluid collection  - clinically stable. Hold off on abtx. - recommend to see if IR can review imaging to sample tissue to send for aerobic culture and gram stain - may need to empirically treat if no other sample obtained - in the differential could also be gouty arthritis of spine.  Elzie Rings [facility] [phone]

## 2017-09-07 NOTE — ED Notes (Signed)
Admitting was page about temp.

## 2017-09-07 NOTE — ED Notes (Signed)
Blood cultures x 2 drawn by Hughes Better, NT.

## 2017-09-07 NOTE — ED Notes (Signed)
Pt given sandwich and soda, pt expressed no other needs.

## 2017-09-07 NOTE — ED Notes (Signed)
Pt being transported to 5W12 via bed. ALL of pt's belongings - 2 labeled belongings bags - placed on pt's bed. Pt verified he has his cell phone.

## 2017-09-07 NOTE — ED Notes (Signed)
Pt placed on hospital bed for comfort.  Pt ambulated to the bathroom (displayed great pain).  Requests water or food, will ask admitting provider.

## 2017-09-07 NOTE — Progress Notes (Signed)
Patient is seen examined. Please see today's H&P for the details. 66 y/o male with PMH of HTN, DM, CAD , CKD, anterior cervical decompression and diskectomy and fusion C6-7, decompression/laminectomy of L3-L4 (2012) is admitted with fever, back pains. MRI showed 3 mm anterolisthesis of L4 on L5 with associated disc bulge and advanced facet arthropathy, resulting in moderate bilateral foraminal and lateral recess stenosis. Either of the L4 or descending L5 nerve roots could be affected.Right eccentric disc bulge with advanced right-sided facet arthrosis at L3-4, resulting in moderate right lateral recess and severe foraminal stenosis. Either of right L3 or L4 nerve roots could be affected. Reactive marrow edema about the bilateral L4-5 facets due to facet arthritis. Finding could serve as a source for lower back pain. Left subarticular disc extrusion with superior migration at L4-5 (series 2, image 9 on sagittal sequence, series 5, image 22 on axial sequence). Superior migration extends approximately 8 mm superior to the parent L4-5 interspace. This could also potentially affect the descending left L4 nerve root. Additionally, soft tissue edema noted within the lower posterior paraspinous musculature, slightly worse on the right, most likely reactive in nature due to the bilateral L4-5 facet degeneration. A component of superimposed muscular strain/injury could also be present.  -neuro exam is non focal. questionable infectious process with fever. Also, patient has h/o chronic gout. No obvious gouty arthritis on evaluation of his peripheral joints. However he has elevated crp, sed rate. Unclear if infectious vs inflammatory process. I have called and d/w with neurosurgery Dr. Vertell Limber who recommended to try to obtain IR biopsy of l4-l. 5. I have also called and d/w Dr. Baxter Flattery from ID who recommended to hold antibiotics, obtain cultures. . I have called ans asked IR evaluation for biopsy. R/o septic joint vs  crystalline inflammatory arthritis. Cont colchicine, pain control.  Kinnie Feil

## 2017-09-07 NOTE — H&P (Addendum)
History and Physical    John [name] BWG:[id number] DOB: 21-Apr-1951 DOA: 09/06/2017  PCP: Imagene Riches, NP  Patient coming from: home.  Chief Complaint: low back pain and fever.  HPI: John [name] is a 66 y.o. male with history of CAD status post stenting, hypertension, diabetes mellitus type 2, chronic kidney disease presented to the ER because of worsening pain in his low back over the last 4 days. Patient states that 4 days ago he was attempting to carry heavy objects and strained his back following which he had started developing worsening pain with fever and chills. Pain increases when he tries to move his left lower extremity. Denies any trauma or fall. Denies any chest pain shortness of breath and productive cough fever but has been having some dysuria.   ED Course: in the ER patient is febrile with temperatures around 102F. Patient appears nonfocal. Pain increases in the low back on raising his left leg.UA chest x-ray were unremarkable. MRI of the lumbar spine shows disc extrusion at L4-L5 and edema around the paraspinal musculature. No definite evidence for infection. Patient admitted for further observation given the pain fever.  Review of Systems: As per HPI, rest all negative.   Past Medical History:  Diagnosis Date  . Diabetes mellitus without complication ([place])   . Hypertension   . MI (myocardial infarction) ([place])   . Renal disorder     Past Surgical History:  Procedure Laterality Date  . ABDOMINAL AORTIC ANEURYSM REPAIR    . CARDIAC CATHETERIZATION    . CORONARY ANGIOPLASTY    . NECK SURGERY    . SMALL INTESTINE SURGERY       reports that he has quit smoking. He has never used smokeless tobacco. He reports that he drinks alcohol. His drug history is not on file.  Allergies  Allergen Reactions  . Penicillins Rash    Has patient had a PCN reaction causing immediate rash, facial/tongue/throat swelling, SOB or lightheadedness with hypotension:  Yes Has patient had a PCN reaction causing severe rash involving mucus membranes or skin necrosis: No Has patient had a PCN reaction that required hospitalization: No Has patient had a PCN reaction occurring within the last 10 years: No If all of the above answers are "NO", then may proceed with Cephalosporin use.    Family History  Problem Relation Age of Onset  . Cancer Maternal Grandmother     Prior to Admission medications   Medication Sig Start Date End Date Taking? Authorizing Provider  acetaminophen (TYLENOL) 500 MG tablet Take 1,500 mg by mouth 2 (two) times daily as needed (pain).   Yes [provider]  amLODipine (NORVASC) 5 MG tablet Take 5 mg by mouth daily. 08/21/17  Yes [provider]  aspirin EC 81 MG tablet Take 81 mg by mouth daily.   Yes [provider]  calcitRIOL (ROCALTROL) 0.5 MCG capsule Take 0.5 mcg by mouth daily. 08/16/17  Yes [provider]  Colchicine 0.6 MG CAPS Take 0.6 mg by mouth daily. 09/03/17  Yes [provider]  Dulaglutide (TRULICITY) 1.5 VX/7.9TJ SOPN Inject 1.5 mg into the skin every Sunday.   Yes [provider]  gabapentin (NEURONTIN) 300 MG capsule Take 300 mg by mouth 3 (three) times daily. 06/17/17  Yes [provider]  linagliptin (TRADJENTA) 5 MG TABS tablet Take 5 mg by mouth daily.   Yes [provider]  metoprolol succinate (TOPROL-XL) 50 MG 24 hr tablet Take 50 mg by  mouth daily. 08/08/17  Yes [provider]  OVER THE COUNTER MEDICATION Place 1 drop into both eyes 2 (two) times daily as needed (dry eyes). Hyper Tears eye drops   Yes [provider]  simvastatin (ZOCOR) 40 MG tablet Take 40 mg by mouth daily. 09/02/17  Yes [provider]  tamsulosin (FLOMAX) 0.4 MG CAPS capsule Take 0.4 mg by mouth daily before supper. 06/29/17  Yes [provider]    Physical Exam: Vitals:   09/06/17 2100 09/06/17 2130 09/06/17 2200 09/07/17 0106   BP: (!) 143/82 125/80 133/75   Pulse: 98 97 98   Resp: 19 18 19    Temp:    100.3 F (37.9 C)  TempSrc:    Oral  SpO2: 93% 95% 95%       Constitutional: moderately built and nourished. Vitals:   09/06/17 2100 09/06/17 2130 09/06/17 2200 09/07/17 0106  BP: (!) 143/82 125/80 133/75   Pulse: 98 97 98   Resp: 19 18 19    Temp:    100.3 F (37.9 C)  TempSrc:    Oral  SpO2: 93% 95% 95%    Eyes: anicteric no pallor. ENMT: no discharge from the ears eyes nose or mouth. Neck: no mass felt. No neck rigidity. Respiratory: no rhonchi or crepitations. Cardiovascular: S1-S2 heard no murmurs appreciated. Abdomen: soft nontender bowel sounds present. No guarding or rigidity. Musculoskeletal: no edema. No joint effusion. Low back onmowing left lower extremity. Skin: no rash. Neurologic:alert awake oriented to time place and person. Moves all extremities. Psychiatric: appears normal.   Labs on Admission: I have personally reviewed following labs and imaging studies  CBC:  Recent Labs Lab 09/06/17 1245  WBC 11.3*  NEUTROABS 8.8*  HGB 13.2  HCT 38.7*  MCV 89.4  PLT 413   Basic Metabolic Panel:  Recent Labs Lab 09/06/17 1245  NA 131*  K 4.2  CL 98*  CO2 21*  GLUCOSE 193*  BUN 48*  CREATININE 4.02*  CALCIUM 9.2   GFR: CrCl cannot be calculated (Unknown ideal weight.). Liver Function Tests:  Recent Labs Lab 09/06/17 1245  AST 15  ALT 20  ALKPHOS 80  BILITOT 0.6  PROT 7.3  ALBUMIN 3.5   No results for input(s): LIPASE, AMYLASE in the last 168 hours. No results for input(s): AMMONIA in the last 168 hours. Coagulation Profile:  Recent Labs Lab 09/06/17 1245  INR 1.02   Cardiac Enzymes: No results for input(s): CKTOTAL, CKMB, CKMBINDEX, TROPONINI in the last 168 hours. BNP (last 3 results) No results for input(s): PROBNP in the last 8760 hours. HbA1C: No results for input(s): HGBA1C in the last 72 hours. CBG: No results for input(s): GLUCAP in the last  168 hours. Lipid Profile: No results for input(s): CHOL, HDL, LDLCALC, TRIG, CHOLHDL, LDLDIRECT in the last 72 hours. Thyroid Function Tests: No results for input(s): TSH, T4TOTAL, FREET4, T3FREE, THYROIDAB in the last 72 hours. Anemia Panel: No results for input(s): VITAMINB12, FOLATE, FERRITIN, TIBC, IRON, RETICCTPCT in the last 72 hours. Urine analysis:    Component Value Date/Time   COLORURINE STRAW (A) 09/06/2017 [place] 09/06/2017 1246   LABSPEC 1.010 09/06/2017 1246   PHURINE 6.0 09/06/2017 1246   GLUCOSEU >=500 (A) 09/06/2017 1246   HGBUR NEGATIVE 09/06/2017 [place] 09/06/2017 1246   KETONESUR NEGATIVE 09/06/2017 1246   PROTEINUR 100 (A) 09/06/2017 1246   NITRITE NEGATIVE 09/06/2017 1246   LEUKOCYTESUR NEGATIVE 09/06/2017 1246   Sepsis Labs: @LABRCNTIP (procalcitonin:4,lacticidven:4) )  No results found for this or any previous visit (from the past 240 hour(s)).   Radiological Exams on Admission: Dg Chest 2 View  Result Date: 09/06/2017 CLINICAL DATA:  66 y/o  M; fever and lower back pain. EXAM: CHEST  2 VIEW COMPARISON:  06/18/2009 chest radiograph FINDINGS: Stable normal cardiac silhouette given projection and technique. Aortic atherosclerosis with calcification. Clear lungs. No pleural effusion or pneumothorax. Multilevel discogenic degenerative changes of the thoracic spine. IMPRESSION: No active cardiopulmonary disease.  Aortic atherosclerosis. Electronically Signed   By: Kristine Garbe M.D.   On: 09/06/2017 20:23   Mr Lumbar Spine Wo Contrast  Addendum Date: 09/06/2017   ADDENDUM REPORT: 09/06/2017 23:50 ADDENDUM: In addition to the initially described findings, there is a tiny left subarticular disc extrusion with superior migration at L4-5 (series 2, image 9 on sagittal sequence, series 5, image 22 on axial sequence). Superior migration extends approximately 8 mm superior to the parent L4-5 interspace. This could also  potentially affect the descending left L4 nerve root. Additionally, soft tissue edema noted within the lower posterior paraspinous musculature, slightly worse on the right, most likely reactive in nature due to the bilateral L4-5 facet degeneration. A component of superimposed muscular strain/injury could also be present. Electronically Signed   By: Jeannine Boga M.D.   On: 09/06/2017 23:50   Result Date: 09/06/2017 CLINICAL DATA:  Initial evaluation for left-sided lower back pain radiating into left leg. EXAM: MRI LUMBAR SPINE WITHOUT CONTRAST TECHNIQUE: Multiplanar, multisequence MR imaging of the lumbar spine was performed. No intravenous contrast was administered. COMPARISON:  Prior radiographs from 08/04/2016 as well as previous MRI from 05/29/2009. FINDINGS: Segmentation: Normal segmentation. Lowest well-formed disc labeled the L5-S1 level. Alignment: Trace 3 mm anterolisthesis of L4 on L5. Vertebral bodies otherwise normally aligned with preservation of the normal lumbar lordosis. Vertebrae: Vertebral body heights are maintained. No evidence for acute or chronic fracture. Chronic endplate Schmorl's nodes present at the superior endplates of L2 and L3. Mild reactive endplate changes about the L3-4 interspace. No discrete or worrisome osseous lesions. Mild reactive edema about the bilateral L4-5 facets due to facet degeneration. Conus medullaris: Extends to the L1 level and appears normal. Paraspinal and other soft tissues: Paraspinous soft tissues demonstrate no acute abnormality. Multiple T2 hyperintense cyst noted within the kidneys bilaterally. Aortic atherosclerosis with associated intra-abdominal aneurysm measuring up to 4.1 cm in size. Disc levels: L1-2: Disc desiccation without significant disc bulge. Small annular fissure noted at the level of the left neural foramen. Mild facet and ligament flavum hypertrophy. No stenosis. L2-3: Diffuse disc bulge with disc desiccation and intervertebral  disc space narrowing. Superimposed broad right subarticular disc protrusion indents the right ventral thecal sac (series 5, image 12). Moderate facet and ligamentum flavum hypertrophy. Resultant mild right lateral recess and canal stenosis without neural impingement. No significant foraminal encroachment. L3-4: Diffuse disc bulge with disc desiccation and intervertebral disc space narrowing. Disc bulge a centric to the right with associated right far lateral reactive endplate changes with marginal endplate spurring. Advanced bilateral facet arthrosis, right worse than left. Patient appears to be status post partial right hemi laminectomy at this level. Residual moderate right lateral recess narrowing, potentially affecting the descending right L4 nerve root (series 5, image 18). Severe right L3 foraminal stenosis (series 4, image 5). Central canal remains widely patent. Relatively mild left foraminal narrowing. L4-5: 3 mm anterolisthesis. Associated mild diffuse disc bulge with disc desiccation. Advanced bilateral facet arthrosis with associated reactive effusions within the  bilateral L4-5 facets. There is a prominent 18 mm synovial cyst at the posterior margin of the left L4-5 facet (series 5, image 27). This does not exert mass effect. Resultant mild canal with moderate bilateral lateral recess narrowing, potentially affecting either of the descending L5 nerve roots. Moderate bilateral foraminal stenosis, left greater than right. L5-S1: Disc desiccation with mild annular disc bulge. Advanced left-sided facet arthrosis. No significant canal or foraminal stenosis. IMPRESSION: 1. 3 mm anterolisthesis of L4 on L5 with associated disc bulge and advanced facet arthropathy, resulting in moderate bilateral foraminal and lateral recess stenosis. Either of the L4 or descending L5 nerve roots could be affected. 2. Right eccentric disc bulge with advanced right-sided facet arthrosis at L3-4, resulting in moderate right  lateral recess and severe foraminal stenosis. Either of right L3 or L4 nerve roots could be affected. 3. Reactive marrow edema about the bilateral L4-5 facets due to facet arthritis. Finding could serve as a source for lower back pain. 4. Atherosclerosis with associated intra- abdominal aneurysm measuring up to 4.2 cm. Recommend followup by ultrasound in 1 year. This recommendation follows ACR consensus guidelines: White Paper of the ACR Incidental Findings Committee II on Vascular Findings. J Am Coll Radiol 2013; 10:789-794. Electronically Signed: By: Jeannine Boga M.D. On: 09/06/2017 23:30     Assessment/Plan Principal Problem:   Low back pain Active Problems:   Acute on chronic kidney failure (HCC)   DM (diabetes mellitus), type 2 with renal complications ([facility])   Essential hypertension   HLD (hyperlipidemia)   Back pain    1. Low back pain with fever - there is disc extrusion at L4-L5 and also some edema around the paraspinal musculature. Blood cultures will be obtained. Check CK. Patient is not been started on antibiotics yet. May discuss with patient's neurosurgeon Dr. Lucia Gaskins in a.m. For now patient is on pain relief medications. Since patient also has significant pain in the left hip I have ordered CT hip. Will consult IR for possible biopsy of lower back. 2. Diabetes mellitus type 2 - patient is on Trulicity and Tradjenta which will be continued along with sliding scale coverage. 3. Chronic kidney disease stage IV - creatinine has increased from last known in our system. Patient is following with Dr. Adriana Mccallum nephrologist. Follow metabolic panel. Recent baseline metabolic panel not known may discuss with patient's nephrologist. 4. Hypertension on metoprolol and amlodipine. 5. History of CAD status post stenting on metoprolol statins and aspirin. 6. Abdominal attic aneurysm - will need follow-up as outpatient. Pain is mostly in the lower back and not abdominal.   DVT  prophylaxis: SCDs for now. Code Status: full code.  Family Communication: discussed with patient.  Disposition Plan: home.  Consults called: none.  Admission status: observation.    Rise Patience MD [facility] Pager [phone].  If 7PM-7AM, please contact night-coverage [email or web address] Password [email or web address]  09/07/2017, 2:43 AM

## 2017-09-07 NOTE — Progress Notes (Signed)
Spoke with Dr. Estanislado Pandy about possible disc aspiration.  He has reviewed the patient's MRI and sees no evidence of any diskitis or infectious findings on his MRI.  He has a synovial cyst at L3 as well as severe foraminal compromise of the L3-L4 nerve root on the right that is likely contributing to his back pain.  Given the lack of infection seen on his MRI, there is no need for disc aspiration.  This was relayed to Dr. Linus Mako E 2:21 PM 09/07/2017

## 2017-09-07 NOTE — ED Notes (Signed)
Admitting MD in w pt

## 2017-09-07 NOTE — ED Notes (Signed)
Patient transported to CT 

## 2017-09-08 DIAGNOSIS — M5442 Lumbago with sciatica, left side: Secondary | ICD-10-CM

## 2017-09-08 LAB — GLUCOSE, CAPILLARY: GLUCOSE-CAPILLARY: 343 mg/dL — AB (ref 65–99)

## 2017-09-08 MED ORDER — INSULIN ASPART 100 UNIT/ML ~~LOC~~ SOLN
0.0000 [IU] | Freq: Three times a day (TID) | SUBCUTANEOUS | Status: DC
  Administered 2017-09-09: 15 [IU] via SUBCUTANEOUS
  Administered 2017-09-09: 7 [IU] via SUBCUTANEOUS
  Administered 2017-09-09 – 2017-09-10 (×2): 15 [IU] via SUBCUTANEOUS
  Administered 2017-09-10: 3 [IU] via SUBCUTANEOUS
  Administered 2017-09-10: 15 [IU] via SUBCUTANEOUS
  Administered 2017-09-11: 7 [IU] via SUBCUTANEOUS
  Administered 2017-09-11 – 2017-09-12 (×3): 11 [IU] via SUBCUTANEOUS
  Administered 2017-09-12: 7 [IU] via SUBCUTANEOUS
  Administered 2017-09-12: 3 [IU] via SUBCUTANEOUS
  Administered 2017-09-13 (×2): 4 [IU] via SUBCUTANEOUS
  Administered 2017-09-13: 7 [IU] via SUBCUTANEOUS
  Administered 2017-09-14: 11 [IU] via SUBCUTANEOUS
  Administered 2017-09-14: 7 [IU] via SUBCUTANEOUS

## 2017-09-08 MED ORDER — COLCHICINE 0.6 MG PO TABS
1.2000 mg | ORAL_TABLET | ORAL | Status: AC
  Administered 2017-09-08: 1.2 mg via ORAL
  Filled 2017-09-08: qty 2

## 2017-09-08 MED ORDER — INSULIN ASPART 100 UNIT/ML ~~LOC~~ SOLN: 5.0000 [IU] | Freq: Once | SUBCUTANEOUS | Status: DC

## 2017-09-08 MED ORDER — METHYLPREDNISOLONE SODIUM SUCC 125 MG IJ SOLR
80.0000 mg | Freq: Two times a day (BID) | INTRAMUSCULAR | Status: DC
  Administered 2017-09-08 – 2017-09-09 (×2): 80 mg via INTRAVENOUS
  Filled 2017-09-08 (×2): qty 2

## 2017-09-08 MED ORDER — KETOTIFEN FUMARATE 0.025 % OP SOLN
1.0000 [drp] | Freq: Two times a day (BID) | OPHTHALMIC | Status: DC
  Administered 2017-09-08 – 2017-09-14 (×12): 1 [drp] via OPHTHALMIC
  Filled 2017-09-08 (×3): qty 5

## 2017-09-08 NOTE — Progress Notes (Signed)
PROGRESS NOTE    John [name]  MPN:[id number] DOB: Aug 25, 1951 DOA: 09/06/2017 PCP: Imagene Riches, NP    Brief Narrative:  66 y.o. male with history of CAD status post stenting, hypertension, diabetes mellitus type 2, chronic kidney disease presented to the ER because of low back pain and fever.  ID consulted for assistance. IR also consulted to obtain biopsy of back for culture results but they do not see infection on review of MRI so they report that there is no need for disc aspiration   Assessment & Plan:   Principal Problem:   Low back pain - differential is infection vs gouty arthritis - start higher dose steroid regimen and place on SSI while on steroids - ID on board, no antibiotics started due to waiting to see if biopsy or not. May have to treat empirically if enough suspicion for infectious cause as source of problem. - MR of lumbar spine reporting disc bulge and findings suspicious for arthritis please see report for details - continue supportive therapy with pain medication.  Fever of unknown origin - ?whether back is source or pt has viral infection. Pt complaining of new onset left eye itching, burning, red on assessment.  Active Problems:   Acute on chronic kidney failure (HCC) -creatinine stable at 4    DM (diabetes mellitus), type 2 with renal complications ([place]) - place on SSI - continue carb modified diet.    Essential hypertension - Pt on amlodipine and metroprolol    HLD (hyperlipidemia) -stable on statin    DVT prophylaxis: SCD's Code Status: Full Family Communication: None at bedside.  Disposition Plan: pending improvement in condition   Consultants:   Infectious disease   Procedures: none   Antimicrobials:    Subjective: Pt states that his back pain is much improved than when he first came in.  Objective: Vitals:   09/07/17 1628 09/07/17 2158 09/08/17 0609 09/08/17 1405  BP: 122/71 133/81 129/70 127/74  Pulse: 78 81 82  85  Resp: 16 18 17 18   Temp: 99.5 F (37.5 C) 99.6 F (37.6 C) 99.3 F (37.4 C) (!) 100.7 F (38.2 C)  TempSrc: Oral Oral Oral Oral  SpO2: 95% 97% 96% 100%  Weight: 93.5 kg (206 lb 1.6 oz)     Height: 6\' 2"  (1.88 m)       Intake/Output Summary (Last 24 hours) at 09/08/17 1807 Last data filed at 09/08/17 1800  Gross per 24 hour  Intake             1080 ml  Output             2200 ml  Net            -1120 ml   Filed Weights   09/07/17 1628  Weight: 93.5 kg (206 lb 1.6 oz)    Examination:  General exam: Appears calm and comfortable, in nad. Respiratory system: Clear to auscultation. Respiratory effort normal. Cardiovascular system: S1 & S2 heard, RRR. No JVD, murmurs, rubs,  Gastrointestinal system: Abdomen is nondistended, soft and nontender. No organomegaly or masses felt. Normal bowel sounds heard. Central nervous system: Alert and oriented. No facial asymmetry. Moves extremities spontaneously Extremities: warm, + distal pulses Skin: No rashes, lesions or ulcers, on limited exam. Psychiatry:  Mood & affect appropriate.     Data Reviewed: I have personally reviewed following labs and imaging studies  CBC:  Recent Labs Lab 09/06/17 1245 09/07/17 0629  WBC 11.3* 8.5  NEUTROABS 8.8*  --  HGB 13.2 12.0*  HCT 38.7* 36.6*  MCV 89.4 89.3  PLT 179 431   Basic Metabolic Panel:  Recent Labs Lab 09/06/17 1245 09/07/17 0629  NA 131* 133*  K 4.2 4.2  CL 98* 101  CO2 21* 21*  GLUCOSE 193* 212*  BUN 48* 54*  CREATININE 4.02* 4.15*  CALCIUM 9.2 9.0   GFR: Estimated Creatinine Clearance: 21.2 mL/min (A) (by C-G formula based on SCr of 4.15 mg/dL (H)). Liver Function Tests:  Recent Labs Lab 09/06/17 1245  AST 15  ALT 20  ALKPHOS 80  BILITOT 0.6  PROT 7.3  ALBUMIN 3.5   No results for input(s): LIPASE, AMYLASE in the last 168 hours. No results for input(s): AMMONIA in the last 168 hours. Coagulation Profile:  Recent Labs Lab 09/06/17 1245  INR  1.02   Cardiac Enzymes:  Recent Labs Lab 09/07/17 0629  CKTOTAL 51   BNP (last 3 results) No results for input(s): PROBNP in the last 8760 hours. HbA1C: No results for input(s): HGBA1C in the last 72 hours. CBG: No results for input(s): GLUCAP in the last 168 hours. Lipid Profile: No results for input(s): CHOL, HDL, LDLCALC, TRIG, CHOLHDL, LDLDIRECT in the last 72 hours. Thyroid Function Tests: No results for input(s): TSH, T4TOTAL, FREET4, T3FREE, THYROIDAB in the last 72 hours. Anemia Panel: No results for input(s): VITAMINB12, FOLATE, FERRITIN, TIBC, IRON, RETICCTPCT in the last 72 hours. Sepsis Labs:  Recent Labs Lab 09/06/17 1847  LATICACIDVEN 1.02    Recent Results (from the past 240 hour(s))  Culture, blood (routine x 2)     Status: None (Preliminary result)   Collection Time: 09/06/17  6:50 PM  Result Value Ref Range Status   Specimen Description BLOOD RIGHT ANTECUBITAL  Final   Special Requests   Final    BOTTLES DRAWN AEROBIC AND ANAEROBIC Blood Culture results may not be optimal due to an excessive volume of blood received in culture bottles   Culture NO GROWTH 2 DAYS  Final   Report Status PENDING  Incomplete  Culture, blood (routine x 2)     Status: None (Preliminary result)   Collection Time: 09/07/17  2:50 PM  Result Value Ref Range Status   Specimen Description BLOOD RIGHT HAND  Final   Special Requests   Final    BOTTLES DRAWN AEROBIC AND ANAEROBIC Blood Culture adequate volume   Culture NO GROWTH < 24 HOURS  Final   Report Status PENDING  Incomplete  Culture, blood (routine x 2)     Status: None (Preliminary result)   Collection Time: 09/07/17  2:59 PM  Result Value Ref Range Status   Specimen Description BLOOD LEFT ANTECUBITAL  Final   Special Requests   Final    BOTTLES DRAWN AEROBIC AND ANAEROBIC Blood Culture results may not be optimal due to an excessive volume of blood received in culture bottles   Culture NO GROWTH < 24 HOURS  Final    Report Status PENDING  Incomplete         Radiology Studies: Dg Chest 2 View  Result Date: 09/06/2017 CLINICAL DATA:  66 y/o  M; fever and lower back pain. EXAM: CHEST  2 VIEW COMPARISON:  06/18/2009 chest radiograph FINDINGS: Stable normal cardiac silhouette given projection and technique. Aortic atherosclerosis with calcification. Clear lungs. No pleural effusion or pneumothorax. Multilevel discogenic degenerative changes of the thoracic spine. IMPRESSION: No active cardiopulmonary disease.  Aortic atherosclerosis. Electronically Signed   By: Kristine Garbe M.D.   On: 09/06/2017  20:23   Mr Lumbar Spine Wo Contrast  Addendum Date: 09/06/2017   ADDENDUM REPORT: 09/06/2017 23:50 ADDENDUM: In addition to the initially described findings, there is a tiny left subarticular disc extrusion with superior migration at L4-5 (series 2, image 9 on sagittal sequence, series 5, image 22 on axial sequence). Superior migration extends approximately 8 mm superior to the parent L4-5 interspace. This could also potentially affect the descending left L4 nerve root. Additionally, soft tissue edema noted within the lower posterior paraspinous musculature, slightly worse on the right, most likely reactive in nature due to the bilateral L4-5 facet degeneration. A component of superimposed muscular strain/injury could also be present. Electronically Signed   By: Jeannine Boga M.D.   On: 09/06/2017 23:50   Result Date: 09/06/2017 CLINICAL DATA:  Initial evaluation for left-sided lower back pain radiating into left leg. EXAM: MRI LUMBAR SPINE WITHOUT CONTRAST TECHNIQUE: Multiplanar, multisequence MR imaging of the lumbar spine was performed. No intravenous contrast was administered. COMPARISON:  Prior radiographs from 08/04/2016 as well as previous MRI from 05/29/2009. FINDINGS: Segmentation: Normal segmentation. Lowest well-formed disc labeled the L5-S1 level. Alignment: Trace 3 mm anterolisthesis of L4  on L5. Vertebral bodies otherwise normally aligned with preservation of the normal lumbar lordosis. Vertebrae: Vertebral body heights are maintained. No evidence for acute or chronic fracture. Chronic endplate Schmorl's nodes present at the superior endplates of L2 and L3. Mild reactive endplate changes about the L3-4 interspace. No discrete or worrisome osseous lesions. Mild reactive edema about the bilateral L4-5 facets due to facet degeneration. Conus medullaris: Extends to the L1 level and appears normal. Paraspinal and other soft tissues: Paraspinous soft tissues demonstrate no acute abnormality. Multiple T2 hyperintense cyst noted within the kidneys bilaterally. Aortic atherosclerosis with associated intra-abdominal aneurysm measuring up to 4.1 cm in size. Disc levels: L1-2: Disc desiccation without significant disc bulge. Small annular fissure noted at the level of the left neural foramen. Mild facet and ligament flavum hypertrophy. No stenosis. L2-3: Diffuse disc bulge with disc desiccation and intervertebral disc space narrowing. Superimposed broad right subarticular disc protrusion indents the right ventral thecal sac (series 5, image 12). Moderate facet and ligamentum flavum hypertrophy. Resultant mild right lateral recess and canal stenosis without neural impingement. No significant foraminal encroachment. L3-4: Diffuse disc bulge with disc desiccation and intervertebral disc space narrowing. Disc bulge a centric to the right with associated right far lateral reactive endplate changes with marginal endplate spurring. Advanced bilateral facet arthrosis, right worse than left. Patient appears to be status post partial right hemi laminectomy at this level. Residual moderate right lateral recess narrowing, potentially affecting the descending right L4 nerve root (series 5, image 18). Severe right L3 foraminal stenosis (series 4, image 5). Central canal remains widely patent. Relatively mild left foraminal  narrowing. L4-5: 3 mm anterolisthesis. Associated mild diffuse disc bulge with disc desiccation. Advanced bilateral facet arthrosis with associated reactive effusions within the bilateral L4-5 facets. There is a prominent 18 mm synovial cyst at the posterior margin of the left L4-5 facet (series 5, image 27). This does not exert mass effect. Resultant mild canal with moderate bilateral lateral recess narrowing, potentially affecting either of the descending L5 nerve roots. Moderate bilateral foraminal stenosis, left greater than right. L5-S1: Disc desiccation with mild annular disc bulge. Advanced left-sided facet arthrosis. No significant canal or foraminal stenosis. IMPRESSION: 1. 3 mm anterolisthesis of L4 on L5 with associated disc bulge and advanced facet arthropathy, resulting in moderate bilateral foraminal and lateral recess  stenosis. Either of the L4 or descending L5 nerve roots could be affected. 2. Right eccentric disc bulge with advanced right-sided facet arthrosis at L3-4, resulting in moderate right lateral recess and severe foraminal stenosis. Either of right L3 or L4 nerve roots could be affected. 3. Reactive marrow edema about the bilateral L4-5 facets due to facet arthritis. Finding could serve as a source for lower back pain. 4. Atherosclerosis with associated intra- abdominal aneurysm measuring up to 4.2 cm. Recommend followup by ultrasound in 1 year. This recommendation follows ACR consensus guidelines: White Paper of the ACR Incidental Findings Committee II on Vascular Findings. J Am Coll Radiol 2013; 10:789-794. Electronically Signed: By: Jeannine Boga M.D. On: 09/06/2017 23:30   Ct Hip Left Wo Contrast  Result Date: 09/07/2017 CLINICAL DATA:  Left hip pain radiating from left lower back following heavy lifting 2 days ago. EXAM: CT OF THE LEFT HIP WITHOUT CONTRAST TECHNIQUE: Multidetector CT imaging of the left hip was performed according to the standard protocol. Multiplanar CT  image reconstructions were also generated. COMPARISON:  01/14/2009 CT abdomen and pelvis FINDINGS: Bones/Joint/Cartilage Bony bridging across the left SI joint consistent with osteoarthritis. No acute fracture of the left hip and included pelvis. Degenerative subchondral cystic changes are noted of the left acetabular roof, more so posteriorly. No suspicious osseous lesions. Mild chondral thinning of the left hip joint. No joint effusion. Ligaments Suboptimally assessed by CT. Muscles and Tendons No intramuscular hemorrhage or atrophy. Soft tissues Included urinary bladder is physiologically distended. Normal size prostate with peripheral zone calcifications. Partially visualized left bi-iliac vascular grafts. IMPRESSION: Osteoarthritis of the left hip and SI joints. No acute nor suspicious osseous abnormality. Electronically Signed   By: Ashley Royalty M.D.   On: 09/07/2017 03:30        Scheduled Meds: . amLODipine  5 mg Oral Daily  . aspirin EC  81 mg Oral Daily  . calcitRIOL  0.5 mcg Oral Daily  . [START ON 09/12/2017] Dulaglutide  1.5 mg Subcutaneous Q Sun  . gabapentin  300 mg Oral TID  . [START ON 09/09/2017] insulin aspart  0-20 Units Subcutaneous TID WC  . ketotifen  1 drop Left Eye BID  . linagliptin  5 mg Oral Daily  . methylPREDNISolone (SOLU-MEDROL) injection  80 mg Intravenous Q12H  . metoprolol succinate  50 mg Oral Daily  . simvastatin  40 mg Oral Daily  . tamsulosin  0.4 mg Oral QAC supper   Continuous Infusions:   LOS: 0 days    Time spent: > 35 minutes   Velvet Bathe, MD [facility] Pager [phone]  If 7PM-7AM, please contact night-coverage [email or web address] Password [email or web address] 09/08/2017, 6:07 PM

## 2017-09-08 NOTE — Progress Notes (Signed)
Pt CBG 343, no insulin coverage, pt started on IV steroids, currently on Tradjenta 5 mg daily. [place], NP notified.

## 2017-09-08 NOTE — Progress Notes (Signed)
[facility]    Date of Admission:  09/06/2017   Total days of antibiotics 0  ID: John [name] is a 66 y.o. male with back pain and fever, concern for discitis Principal Problem:   Low back pain Active Problems:   Acute on chronic kidney failure (HCC)   DM (diabetes mellitus), type 2 with renal complications (HCC)   Essential hypertension   HLD (hyperlipidemia)   Back pain    Subjective: Still has debilitating back pain.   He has hx of gout in the past mostly extremities, takes daily colchicine  He has limited walking in his room due to excessive back pain  IR reviewed imaging but felt not a candidate for aspiration since it does not appear infectious process on imaging  Medications:  . amLODipine  5 mg Oral Daily  . aspirin EC  81 mg Oral Daily  . calcitRIOL  0.5 mcg Oral Daily  . [START ON 09/12/2017] Dulaglutide  1.5 mg Subcutaneous Q Sun  . gabapentin  300 mg Oral TID  . [START ON 09/09/2017] insulin aspart  0-20 Units Subcutaneous TID WC  . ketotifen  1 drop Left Eye BID  . linagliptin  5 mg Oral Daily  . methylPREDNISolone (SOLU-MEDROL) injection  80 mg Intravenous Q12H  . metoprolol succinate  50 mg Oral Daily  . simvastatin  40 mg Oral Daily  . tamsulosin  0.4 mg Oral QAC supper    Objective: Vital signs in last 24 hours: Temp:  [99.3 F (37.4 C)-100.7 F (38.2 C)] 100.7 F (38.2 C) (10/17 1405) Pulse Rate:  [81-85] 85 (10/17 1405) Resp:  [17-18] 18 (10/17 1405) BP: (127-133)/(70-81) 127/74 (10/17 1405) SpO2:  [96 %-100 %] 100 % (10/17 1405) Physical Exam  Constitutional: He is oriented to person, place, and time. He appears well-developed and well-nourished. No distress.  HENT:  Mouth/Throat: Oropharynx is clear and moist. No oropharyngeal exudate.  Cardiovascular: Normal rate, regular rhythm and normal heart sounds. Exam reveals no gallop and no friction rub.  No murmur heard.  Pulmonary/Chest: Effort normal and  breath sounds normal. No respiratory distress. He has no wheezes.  Abdominal: Soft. Bowel sounds are normal. He exhibits no distension. There is no tenderness.  Lymphadenopathy:  He has no cervical adenopathy.  Neurological: He is alert and oriented to person, place, and time.  Skin: Skin is warm and dry. No rash noted. No erythema.  Psychiatric: He has a normal mood and affect. His behavior is normal.     Lab Results  Recent Labs  09/06/17 1245 09/07/17 0629  WBC 11.3* 8.5  HGB 13.2 12.0*  HCT 38.7* 36.6*  NA 131* 133*  K 4.2 4.2  CL 98* 101  CO2 21* 21*  BUN 48* 54*  CREATININE 4.02* 4.15*   Liver Panel  Recent Labs  09/06/17 1245  PROT 7.3  ALBUMIN 3.5  AST 15  ALT 20  ALKPHOS 80  BILITOT 0.6   Sedimentation Rate  Recent Labs  09/06/17 1833  ESRSEDRATE 53*   C-Reactive Protein  Recent Labs  09/06/17 1833  CRP 18.7*    Microbiology: Blood cx ngtd Studies/Results: Dg Chest 2 View  Result Date: 09/06/2017 CLINICAL DATA:  66 y/o  M; fever and lower back pain. EXAM: CHEST  2 VIEW COMPARISON:  06/18/2009 chest radiograph FINDINGS: Stable normal cardiac silhouette given projection and technique. Aortic atherosclerosis with calcification. Clear lungs. No pleural effusion or pneumothorax. Multilevel discogenic degenerative changes of the thoracic  spine. IMPRESSION: No active cardiopulmonary disease.  Aortic atherosclerosis. Electronically Signed   By: Kristine Garbe M.D.   On: 09/06/2017 20:23   Mr Lumbar Spine Wo Contrast  Addendum Date: 09/06/2017   ADDENDUM REPORT: 09/06/2017 23:50 ADDENDUM: In addition to the initially described findings, there is a tiny left subarticular disc extrusion with superior migration at L4-5 (series 2, image 9 on sagittal sequence, series 5, image 22 on axial sequence). Superior migration extends approximately 8 mm superior to the parent L4-5 interspace. This could also potentially affect the descending left L4 nerve  root. Additionally, soft tissue edema noted within the lower posterior paraspinous musculature, slightly worse on the right, most likely reactive in nature due to the bilateral L4-5 facet degeneration. A component of superimposed muscular strain/injury could also be present. Electronically Signed   By: Jeannine Boga M.D.   On: 09/06/2017 23:50   Result Date: 09/06/2017 CLINICAL DATA:  Initial evaluation for left-sided lower back pain radiating into left leg. EXAM: MRI LUMBAR SPINE WITHOUT CONTRAST TECHNIQUE: Multiplanar, multisequence MR imaging of the lumbar spine was performed. No intravenous contrast was administered. COMPARISON:  Prior radiographs from 08/04/2016 as well as previous MRI from 05/29/2009. FINDINGS: Segmentation: Normal segmentation. Lowest well-formed disc labeled the L5-S1 level. Alignment: Trace 3 mm anterolisthesis of L4 on L5. Vertebral bodies otherwise normally aligned with preservation of the normal lumbar lordosis. Vertebrae: Vertebral body heights are maintained. No evidence for acute or chronic fracture. Chronic endplate Schmorl's nodes present at the superior endplates of L2 and L3. Mild reactive endplate changes about the L3-4 interspace. No discrete or worrisome osseous lesions. Mild reactive edema about the bilateral L4-5 facets due to facet degeneration. Conus medullaris: Extends to the L1 level and appears normal. Paraspinal and other soft tissues: Paraspinous soft tissues demonstrate no acute abnormality. Multiple T2 hyperintense cyst noted within the kidneys bilaterally. Aortic atherosclerosis with associated intra-abdominal aneurysm measuring up to 4.1 cm in size. Disc levels: L1-2: Disc desiccation without significant disc bulge. Small annular fissure noted at the level of the left neural foramen. Mild facet and ligament flavum hypertrophy. No stenosis. L2-3: Diffuse disc bulge with disc desiccation and intervertebral disc space narrowing. Superimposed broad right  subarticular disc protrusion indents the right ventral thecal sac (series 5, image 12). Moderate facet and ligamentum flavum hypertrophy. Resultant mild right lateral recess and canal stenosis without neural impingement. No significant foraminal encroachment. L3-4: Diffuse disc bulge with disc desiccation and intervertebral disc space narrowing. Disc bulge a centric to the right with associated right far lateral reactive endplate changes with marginal endplate spurring. Advanced bilateral facet arthrosis, right worse than left. Patient appears to be status post partial right hemi laminectomy at this level. Residual moderate right lateral recess narrowing, potentially affecting the descending right L4 nerve root (series 5, image 18). Severe right L3 foraminal stenosis (series 4, image 5). Central canal remains widely patent. Relatively mild left foraminal narrowing. L4-5: 3 mm anterolisthesis. Associated mild diffuse disc bulge with disc desiccation. Advanced bilateral facet arthrosis with associated reactive effusions within the bilateral L4-5 facets. There is a prominent 18 mm synovial cyst at the posterior margin of the left L4-5 facet (series 5, image 27). This does not exert mass effect. Resultant mild canal with moderate bilateral lateral recess narrowing, potentially affecting either of the descending L5 nerve roots. Moderate bilateral foraminal stenosis, left greater than right. L5-S1: Disc desiccation with mild annular disc bulge. Advanced left-sided facet arthrosis. No significant canal or foraminal stenosis. IMPRESSION: 1. 3  mm anterolisthesis of L4 on L5 with associated disc bulge and advanced facet arthropathy, resulting in moderate bilateral foraminal and lateral recess stenosis. Either of the L4 or descending L5 nerve roots could be affected. 2. Right eccentric disc bulge with advanced right-sided facet arthrosis at L3-4, resulting in moderate right lateral recess and severe foraminal stenosis. Either  of right L3 or L4 nerve roots could be affected. 3. Reactive marrow edema about the bilateral L4-5 facets due to facet arthritis. Finding could serve as a source for lower back pain. 4. Atherosclerosis with associated intra- abdominal aneurysm measuring up to 4.2 cm. Recommend followup by ultrasound in 1 year. This recommendation follows ACR consensus guidelines: White Paper of the ACR Incidental Findings Committee II on Vascular Findings. J Am Coll Radiol 2013; 10:789-794. Electronically Signed: By: Jeannine Boga M.D. On: 09/06/2017 23:30   Ct Hip Left Wo Contrast  Result Date: 09/07/2017 CLINICAL DATA:  Left hip pain radiating from left lower back following heavy lifting 2 days ago. EXAM: CT OF THE LEFT HIP WITHOUT CONTRAST TECHNIQUE: Multidetector CT imaging of the left hip was performed according to the standard protocol. Multiplanar CT image reconstructions were also generated. COMPARISON:  01/14/2009 CT abdomen and pelvis FINDINGS: Bones/Joint/Cartilage Bony bridging across the left SI joint consistent with osteoarthritis. No acute fracture of the left hip and included pelvis. Degenerative subchondral cystic changes are noted of the left acetabular roof, more so posteriorly. No suspicious osseous lesions. Mild chondral thinning of the left hip joint. No joint effusion. Ligaments Suboptimally assessed by CT. Muscles and Tendons No intramuscular hemorrhage or atrophy. Soft tissues Included urinary bladder is physiologically distended. Normal size prostate with peripheral zone calcifications. Partially visualized left bi-iliac vascular grafts. IMPRESSION: Osteoarthritis of the left hip and SI joints. No acute nor suspicious osseous abnormality. Electronically Signed   By: Ashley Royalty M.D.   On: 09/07/2017 03:30     Assessment/Plan: FUO with back pain   - blood cx pending - possibly could be due to gouty arthritis - consider giving steroid trial to see if it improves his back pain  DM -  patient reports having marked elevation in his BS when placed on steroids. Recommend q 4 BS checks and ISS to assess his insulin needs  Fever curve appears trending down without abtx, continue to monitor off of abtx  Leukocytosis = improving  Baxter Flattery [facility] Cell: [phone] Pager: [phone]  09/08/2017, 8:01 PM

## 2017-09-08 NOTE — Progress Notes (Signed)
Patient with recent back surgery admitted for FUO.  Dr. Graylon Good consulted today.  Currently with c/o new onset left eye itching, burning, red on assessment.  Will try ketotifen ophthalmic to see if this improves the problem.  A viral infection that includes conjunctivitis could also explain his FUO, potentially.  Will follow.  Carlyon Shadow, M.D.

## 2017-09-09 ENCOUNTER — Observation Stay (HOSPITAL_COMMUNITY): Payer: 59

## 2017-09-09 DIAGNOSIS — M5126 Other intervertebral disc displacement, lumbar region: Secondary | ICD-10-CM | POA: Diagnosis present

## 2017-09-09 DIAGNOSIS — E785 Hyperlipidemia, unspecified: Secondary | ICD-10-CM | POA: Diagnosis present

## 2017-09-09 DIAGNOSIS — K5900 Constipation, unspecified: Secondary | ICD-10-CM | POA: Diagnosis present

## 2017-09-09 DIAGNOSIS — E1121 Type 2 diabetes mellitus with diabetic nephropathy: Secondary | ICD-10-CM | POA: Diagnosis not present

## 2017-09-09 DIAGNOSIS — I2510 Atherosclerotic heart disease of native coronary artery without angina pectoris: Secondary | ICD-10-CM | POA: Diagnosis present

## 2017-09-09 DIAGNOSIS — I330 Acute and subacute infective endocarditis: Secondary | ICD-10-CM | POA: Diagnosis present

## 2017-09-09 DIAGNOSIS — Z79899 Other long term (current) drug therapy: Secondary | ICD-10-CM | POA: Diagnosis not present

## 2017-09-09 DIAGNOSIS — N179 Acute kidney failure, unspecified: Secondary | ICD-10-CM | POA: Diagnosis present

## 2017-09-09 DIAGNOSIS — Z23 Encounter for immunization: Secondary | ICD-10-CM | POA: Diagnosis not present

## 2017-09-09 DIAGNOSIS — R000 Tachycardia, unspecified: Secondary | ICD-10-CM | POA: Diagnosis present

## 2017-09-09 DIAGNOSIS — M549 Dorsalgia, unspecified: Secondary | ICD-10-CM | POA: Diagnosis present

## 2017-09-09 DIAGNOSIS — M4690 Unspecified inflammatory spondylopathy, site unspecified: Secondary | ICD-10-CM | POA: Diagnosis present

## 2017-09-09 DIAGNOSIS — I700 Atherosclerosis of aorta: Secondary | ICD-10-CM | POA: Diagnosis present

## 2017-09-09 DIAGNOSIS — N184 Chronic kidney disease, stage 4 (severe): Secondary | ICD-10-CM | POA: Diagnosis present

## 2017-09-09 DIAGNOSIS — M545 Low back pain: Secondary | ICD-10-CM | POA: Diagnosis not present

## 2017-09-09 DIAGNOSIS — M5442 Lumbago with sciatica, left side: Secondary | ICD-10-CM | POA: Diagnosis present

## 2017-09-09 DIAGNOSIS — M7138 Other bursal cyst, other site: Secondary | ICD-10-CM | POA: Diagnosis present

## 2017-09-09 DIAGNOSIS — I10 Essential (primary) hypertension: Secondary | ICD-10-CM | POA: Diagnosis not present

## 2017-09-09 DIAGNOSIS — E1165 Type 2 diabetes mellitus with hyperglycemia: Secondary | ICD-10-CM | POA: Diagnosis present

## 2017-09-09 DIAGNOSIS — I358 Other nonrheumatic aortic valve disorders: Secondary | ICD-10-CM | POA: Diagnosis not present

## 2017-09-09 DIAGNOSIS — B9561 Methicillin susceptible Staphylococcus aureus infection as the cause of diseases classified elsewhere: Secondary | ICD-10-CM | POA: Diagnosis present

## 2017-09-09 DIAGNOSIS — Z880 Allergy status to penicillin: Secondary | ICD-10-CM | POA: Diagnosis not present

## 2017-09-09 DIAGNOSIS — R509 Fever, unspecified: Secondary | ICD-10-CM | POA: Diagnosis not present

## 2017-09-09 DIAGNOSIS — M109 Gout, unspecified: Secondary | ICD-10-CM

## 2017-09-09 DIAGNOSIS — Z981 Arthrodesis status: Secondary | ICD-10-CM | POA: Diagnosis not present

## 2017-09-09 DIAGNOSIS — I714 Abdominal aortic aneurysm, without rupture: Secondary | ICD-10-CM | POA: Diagnosis present

## 2017-09-09 DIAGNOSIS — I129 Hypertensive chronic kidney disease with stage 1 through stage 4 chronic kidney disease, or unspecified chronic kidney disease: Secondary | ICD-10-CM | POA: Diagnosis present

## 2017-09-09 DIAGNOSIS — I5189 Other ill-defined heart diseases: Secondary | ICD-10-CM | POA: Diagnosis present

## 2017-09-09 DIAGNOSIS — R7881 Bacteremia: Secondary | ICD-10-CM | POA: Diagnosis present

## 2017-09-09 DIAGNOSIS — E1122 Type 2 diabetes mellitus with diabetic chronic kidney disease: Secondary | ICD-10-CM | POA: Diagnosis present

## 2017-09-09 DIAGNOSIS — E7849 Other hyperlipidemia: Secondary | ICD-10-CM | POA: Diagnosis not present

## 2017-09-09 DIAGNOSIS — I080 Rheumatic disorders of both mitral and aortic valves: Secondary | ICD-10-CM | POA: Diagnosis present

## 2017-09-09 LAB — BLOOD CULTURE ID PANEL (REFLEXED)
ACINETOBACTER BAUMANNII: NOT DETECTED
CANDIDA GLABRATA: NOT DETECTED
Candida albicans: NOT DETECTED
Candida krusei: NOT DETECTED
Candida parapsilosis: NOT DETECTED
Candida tropicalis: NOT DETECTED
ENTEROBACTER CLOACAE COMPLEX: NOT DETECTED
Enterobacteriaceae species: NOT DETECTED
Enterococcus species: NOT DETECTED
Escherichia coli: NOT DETECTED
Haemophilus influenzae: NOT DETECTED
Klebsiella oxytoca: NOT DETECTED
Klebsiella pneumoniae: NOT DETECTED
Listeria monocytogenes: NOT DETECTED
Methicillin resistance: NOT DETECTED
NEISSERIA MENINGITIDIS: NOT DETECTED
PROTEUS SPECIES: NOT DETECTED
PSEUDOMONAS AERUGINOSA: NOT DETECTED
STAPHYLOCOCCUS AUREUS BCID: DETECTED — AB
STREPTOCOCCUS PNEUMONIAE: NOT DETECTED
STREPTOCOCCUS PYOGENES: NOT DETECTED
Serratia marcescens: NOT DETECTED
Staphylococcus species: DETECTED — AB
Streptococcus agalactiae: NOT DETECTED
Streptococcus species: NOT DETECTED

## 2017-09-09 LAB — ECHOCARDIOGRAM COMPLETE
HEIGHTINCHES: 74 in
Weight: 3297.6 oz

## 2017-09-09 LAB — GLUCOSE, CAPILLARY
GLUCOSE-CAPILLARY: 289 mg/dL — AB (ref 65–99)
GLUCOSE-CAPILLARY: 328 mg/dL — AB (ref 65–99)
Glucose-Capillary: 308 mg/dL — ABNORMAL HIGH (ref 65–99)
Glucose-Capillary: 204 mg/dL — ABNORMAL HIGH (ref 65–99)

## 2017-09-09 MED ORDER — SENNOSIDES-DOCUSATE SODIUM 8.6-50 MG PO TABS
1.0000 | ORAL_TABLET | Freq: Two times a day (BID) | ORAL | Status: DC
  Administered 2017-09-09 (×2): 1 via ORAL
  Filled 2017-09-09: qty 1

## 2017-09-09 MED ORDER — CEFAZOLIN SODIUM-DEXTROSE 1-4 GM/50ML-% IV SOLN
1.0000 g | Freq: Two times a day (BID) | INTRAVENOUS | Status: DC
  Administered 2017-09-09 – 2017-09-14 (×10): 1 g via INTRAVENOUS
  Filled 2017-09-09 (×12): qty 50

## 2017-09-09 MED ORDER — INSULIN ASPART 100 UNIT/ML ~~LOC~~ SOLN
5.0000 [IU] | Freq: Once | SUBCUTANEOUS | Status: AC
  Administered 2017-09-10: 5 [IU] via SUBCUTANEOUS

## 2017-09-09 MED ORDER — METHYLPREDNISOLONE SODIUM SUCC 125 MG IJ SOLR
60.0000 mg | Freq: Two times a day (BID) | INTRAMUSCULAR | Status: DC
  Administered 2017-09-09 – 2017-09-11 (×4): 60 mg via INTRAVENOUS
  Filled 2017-09-09 (×4): qty 2

## 2017-09-09 MED ORDER — BISACODYL 10 MG RE SUPP
10.0000 mg | Freq: Every day | RECTAL | Status: DC | PRN
  Administered 2017-09-11: 10 mg via RECTAL
  Filled 2017-09-09: qty 1

## 2017-09-09 MED ORDER — METOCLOPRAMIDE HCL 5 MG/ML IJ SOLN
5.0000 mg | Freq: Once | INTRAMUSCULAR | Status: AC
  Administered 2017-09-09: 5 mg via INTRAVENOUS
  Filled 2017-09-09: qty 2

## 2017-09-09 MED ORDER — POLYETHYLENE GLYCOL 3350 17 G PO PACK
17.0000 g | PACK | Freq: Every day | ORAL | Status: DC
  Administered 2017-09-09 – 2017-09-14 (×5): 17 g via ORAL
  Filled 2017-09-09 (×6): qty 1

## 2017-09-09 NOTE — Progress Notes (Signed)
ID would like TEE to be done for MSSA bacteremia.  Request sent to cardiology inbox.  Patient made NPO after midnight in case can be done in Am

## 2017-09-09 NOTE — Progress Notes (Signed)
PHARMACY - PHYSICIAN COMMUNICATION CRITICAL VALUE ALERT - BLOOD CULTURE IDENTIFICATION (BCID)  Results for orders placed or performed during the hospital encounter of 09/06/17  Blood Culture ID Panel (Reflexed) (Collected: 09/07/2017  2:59 PM)  Result Value Ref Range   Enterococcus species NOT DETECTED NOT DETECTED   Listeria monocytogenes NOT DETECTED NOT DETECTED   Staphylococcus species DETECTED (A) NOT DETECTED   Staphylococcus aureus DETECTED (A) NOT DETECTED   Methicillin resistance NOT DETECTED NOT DETECTED   Streptococcus species NOT DETECTED NOT DETECTED   Streptococcus agalactiae NOT DETECTED NOT DETECTED   Streptococcus pneumoniae NOT DETECTED NOT DETECTED   Streptococcus pyogenes NOT DETECTED NOT DETECTED   Acinetobacter baumannii NOT DETECTED NOT DETECTED   Enterobacteriaceae species NOT DETECTED NOT DETECTED   Enterobacter cloacae complex NOT DETECTED NOT DETECTED   Escherichia coli NOT DETECTED NOT DETECTED   Klebsiella oxytoca NOT DETECTED NOT DETECTED   Klebsiella pneumoniae NOT DETECTED NOT DETECTED   Proteus species NOT DETECTED NOT DETECTED   Serratia marcescens NOT DETECTED NOT DETECTED   Haemophilus influenzae NOT DETECTED NOT DETECTED   Neisseria meningitidis NOT DETECTED NOT DETECTED   Pseudomonas aeruginosa NOT DETECTED NOT DETECTED   Candida albicans NOT DETECTED NOT DETECTED   Candida glabrata NOT DETECTED NOT DETECTED   Candida krusei NOT DETECTED NOT DETECTED   Candida parapsilosis NOT DETECTED NOT DETECTED   Candida tropicalis NOT DETECTED NOT DETECTED    Name of physician (or Provider) Contacted: Dr. Baxter Flattery   Changes to prescribed antibiotics required: 1/2 BCx with MSSA - discussed with ID Baxter Flattery) who is consulted and will add Cefazolin.   Lawson Radar 09/09/2017  12:37 PM

## 2017-09-09 NOTE — Progress Notes (Addendum)
PROGRESS NOTE    John [name]  OMB:[id number] DOB: Apr 22, 1951 DOA: 09/06/2017 PCP: Imagene Riches, NP    Brief Narrative:  66 y.o. male with history of CAD status post stenting, hypertension, diabetes mellitus type 2, chronic kidney disease presented to the ER because of low back pain and fever.  ID consulted for assistance. IR also consulted to obtain biopsy of back for culture results but they do not see infection on review of MRI so they report that there is no need for disc aspiration.  Gout flare was suspected and patient started on high-dose steroids with improvement in symptoms but culture then came back positive for MSSA bacteremia   Assessment & Plan:     Low back pain- gout vs MSSA bacteremia - poor kidneys so unable to start NSAIDS -wean steroids as tolerated - MR of lumbar spine reporting disc bulge and findings suspicious for arthritis- outpatient follow up - continue supportive therapy with pain medication.  MSSA bacteremia -echo pending -ID consult -repeat blood cultures ordered -IV abx -had been seen by IR: Dr. Estanislado Pandy: He has reviewed the patient's MRI and sees no evidence of any diskitis or infectious findings on his MRI.  He has a synovial cyst at L3 as well as severe foraminal compromise of the L3-L4 nerve root on the right that is likely contributing to his back pain.  Given the lack of infection seen on his MRI, there is no need for disc aspiration.  Constipation -bowel regimen -ambualte  Fever of unknown origin - ?whether back is source or pt has viral infection. Pt complaining of new onset left eye itching, burning, red on assessment.  Active Problems:   Acute on chronic kidney failure (HCC) -creatinine stable at 4    DM (diabetes mellitus), type 2 with renal complications ([place]) - place on SSI- may need meal coverage and lantus - continue carb modified diet.    Essential hypertension - amlodipine and metroprolol    HLD  (hyperlipidemia) -stable on statin  Labs ordered for the AM   DVT prophylaxis: SCD's Code Status: Full Family Communication: None at bedside.  Disposition Plan: after weaning of IV steroids   Consultants:   Infectious disease   Procedures: none   Antimicrobials: IV cefazolin   Subjective: Has not had a BM in several days Pain slowly improving   Objective: Vitals:   09/09/17 0551 09/09/17 0625 09/09/17 0700 09/09/17 1000  BP: (!) 150/83   132/83  Pulse: 71   68  Resp: 17     Temp:  (!) 97.4 F (36.3 C) 97.6 F (36.4 C)   TempSrc:  Oral Oral   SpO2: 98%     Weight:      Height:        Intake/Output Summary (Last 24 hours) at 09/09/17 1228 Last data filed at 09/08/17 1808  Gross per 24 hour  Intake              720 ml  Output             1125 ml  Net             -405 ml   Filed Weights   09/07/17 1628  Weight: 93.5 kg (206 lb 1.6 oz)    Examination:  General exam: in bed, NAD Respiratory system: clear Cardiovascular system: rrr Gastrointestinal system: +BS, soft Central nervous system: alert and oriented Extremities: no rash, moves all 4 ext    Data Reviewed: I have personally reviewed  following labs and imaging studies  CBC:  Recent Labs Lab 09/06/17 1245 09/07/17 0629  WBC 11.3* 8.5  NEUTROABS 8.8*  --   HGB 13.2 12.0*  HCT 38.7* 36.6*  MCV 89.4 89.3  PLT 179 951   Basic Metabolic Panel:  Recent Labs Lab 09/06/17 1245 09/07/17 0629  NA 131* 133*  K 4.2 4.2  CL 98* 101  CO2 21* 21*  GLUCOSE 193* 212*  BUN 48* 54*  CREATININE 4.02* 4.15*  CALCIUM 9.2 9.0   GFR: Estimated Creatinine Clearance: 21.2 mL/min (A) (by C-G formula based on SCr of 4.15 mg/dL (H)). Liver Function Tests:  Recent Labs Lab 09/06/17 1245  AST 15  ALT 20  ALKPHOS 80  BILITOT 0.6  PROT 7.3  ALBUMIN 3.5   No results for input(s): LIPASE, AMYLASE in the last 168 hours. No results for input(s): AMMONIA in the last 168 hours. Coagulation  Profile:  Recent Labs Lab 09/06/17 1245  INR 1.02   Cardiac Enzymes:  Recent Labs Lab 09/07/17 0629  CKTOTAL 51   BNP (last 3 results) No results for input(s): PROBNP in the last 8760 hours. HbA1C: No results for input(s): HGBA1C in the last 72 hours. CBG:  Recent Labs Lab 09/08/17 2327 09/09/17 0748 09/09/17 1016  GLUCAP 343* 289* 328*   Lipid Profile: No results for input(s): CHOL, HDL, LDLCALC, TRIG, CHOLHDL, LDLDIRECT in the last 72 hours. Thyroid Function Tests: No results for input(s): TSH, T4TOTAL, FREET4, T3FREE, THYROIDAB in the last 72 hours. Anemia Panel: No results for input(s): VITAMINB12, FOLATE, FERRITIN, TIBC, IRON, RETICCTPCT in the last 72 hours. Sepsis Labs:  Recent Labs Lab 09/06/17 1847  LATICACIDVEN 1.02    Recent Results (from the past 240 hour(s))  Culture, blood (routine x 2)     Status: None (Preliminary result)   Collection Time: 09/06/17  6:50 PM  Result Value Ref Range Status   Specimen Description BLOOD RIGHT ANTECUBITAL  Final   Special Requests   Final    BOTTLES DRAWN AEROBIC AND ANAEROBIC Blood Culture results may not be optimal due to an excessive volume of blood received in culture bottles   Culture NO GROWTH 2 DAYS  Final   Report Status PENDING  Incomplete  Culture, blood (routine x 2)     Status: None (Preliminary result)   Collection Time: 09/07/17  2:50 PM  Result Value Ref Range Status   Specimen Description BLOOD RIGHT HAND  Final   Special Requests   Final    BOTTLES DRAWN AEROBIC AND ANAEROBIC Blood Culture adequate volume   Culture NO GROWTH < 24 HOURS  Final   Report Status PENDING  Incomplete  Culture, blood (routine x 2)     Status: None (Preliminary result)   Collection Time: 09/07/17  2:59 PM  Result Value Ref Range Status   Specimen Description BLOOD LEFT ANTECUBITAL  Final   Special Requests   Final    BOTTLES DRAWN AEROBIC AND ANAEROBIC Blood Culture results may not be optimal due to an excessive  volume of blood received in culture bottles   Culture  Setup Time   Final    GRAM POSITIVE COCCI IN CLUSTERS AEROBIC BOTTLE ONLY Organism ID to follow    Culture GRAM POSITIVE COCCI  Final   Report Status PENDING  Incomplete         Radiology Studies: No results found.      Scheduled Meds: . amLODipine  5 mg Oral Daily  . aspirin EC  81  mg Oral Daily  . calcitRIOL  0.5 mcg Oral Daily  . [START ON 09/12/2017] Dulaglutide  1.5 mg Subcutaneous Q Sun  . gabapentin  300 mg Oral TID  . insulin aspart  0-20 Units Subcutaneous TID WC  . ketotifen  1 drop Left Eye BID  . linagliptin  5 mg Oral Daily  . methylPREDNISolone (SOLU-MEDROL) injection  60 mg Intravenous Q12H  . metoprolol succinate  50 mg Oral Daily  . polyethylene glycol  17 g Oral Daily  . senna-docusate  1 tablet Oral BID  . simvastatin  40 mg Oral Daily  . tamsulosin  0.4 mg Oral QAC supper   Continuous Infusions:   LOS: 0 days    Time spent: >25 minutes   [place], DO [facility] Pager [phone]  If 7PM-7AM, please contact night-coverage [email or web address] Password [email or web address] 09/09/2017, 12:28 PM

## 2017-09-09 NOTE — Progress Notes (Signed)
Pharmacy Antibiotic Note  John [name] is a 66 y.o. male admitted on 09/06/2017 with worsening back pain and fevers and now found to have 1/2 blood cultures growing MSSA. Pharmacy has been consulted for Cefazolin dosing. Noted PCN allergy listed as rash.  SCr 4.15, CrCl~20 ml/min. Will require renal adjustment. Normal dosing of 2g IV every 8 hours will be adjusted to 1g IV every 12 hours.   Plan: 1. Cefazolin 1g IV every 12 hours 2. Will continue to follow renal function, culture results, LOT, and antibiotic de-escalation plans   Height: 6\' 2"  (188 cm) Weight: 206 lb 1.6 oz (93.5 kg) IBW/kg (Calculated) : 82.2  Temp (24hrs), Avg:98.6 F (37 C), Min:97.4 F (36.3 C), Max:100.7 F (38.2 C)   Recent Labs Lab 09/06/17 1245 09/06/17 1847 09/07/17 0629  WBC 11.3*  --  8.5  CREATININE 4.02*  --  4.15*  LATICACIDVEN  --  1.02  --     Estimated Creatinine Clearance: 21.2 mL/min (A) (by C-G formula based on SCr of 4.15 mg/dL (H)).    Allergies  Allergen Reactions  . Penicillins Rash    Has patient had a PCN reaction causing immediate rash, facial/tongue/throat swelling, SOB or lightheadedness with hypotension: Yes Has patient had a PCN reaction causing severe rash involving mucus membranes or skin necrosis: No Has patient had a PCN reaction that required hospitalization: No Has patient had a PCN reaction occurring within the last 10 years: No If all of the above answers are "NO", then may proceed with Cephalosporin use.    Antimicrobials this admission: Cefazolin 10/18 >>  Dose adjustments this admission: n/a  Microbiology results: 10/15 BCx >> ngtd 10/16 BCx >> 1/2 GPC in clusters (BCID MSSA)  Thank you for allowing pharmacy to be a part of this patient's care.  Alycia Rossetti, PharmD, BCPS Clinical Pharmacist Pager: [phone] Clinical phone for 09/09/2017 from 7a-3:30p: [phone] If after 3:30p, please call main pharmacy at: [phone] 09/09/2017 12:55 PM

## 2017-09-09 NOTE — Progress Notes (Signed)
  Echocardiogram 2D Echocardiogram has been performed.  Merrie Roof F 09/09/2017, 3:30 PM

## 2017-09-09 NOTE — Progress Notes (Signed)
Notified of positive MSSA on blood culture.  - Start Cefazolin.  - Repeat blood cultures late 10/19 or early 10/20.  - TEE to rule out endocarditis.        [facility]icrobial Management Team Staphylococcus aureus bacteremia   Staphylococcus aureus bacteremia (SAB) is associated with a high rate of complications and mortality.  Specific aspects of clinical management are critical to optimizing the outcome of patients with SAB.  Therefore, the [facility] Antimicrobial Management Team [facility]) has initiated an intervention aimed at improving the management of SAB at [facility].  To do so, Infectious Diseases physicians are providing an evidence-based consult for the management of all patients with SAB.     Yes No Comments  Perform follow-up blood cultures (even if the patient is afebrile) to ensure clearance of bacteremia [x]  []  Completed 09/07/17       Perform echocardiography to evaluate for endocarditis (transthoracic ECHO is 40-50% sensitive, TEE is > 90% sensitive) []  []  TEE ordered       Ensure source control []  []  Have all abscesses been drained effectively? Have deep seeded infections (septic joints or osteomyelitis) had appropriate surgical debridement?  Investigate for "metastatic" sites of infection []  []  MRI Lumbar spine 09/06/17 with no evidence of infection  Change antibiotic therapy to __________________ []  []  Start Cefazolin for MSSA  Estimated duration of IV antibiotic therapy:   []  []  Pending TEE results

## 2017-09-10 ENCOUNTER — Encounter (HOSPITAL_COMMUNITY): Payer: Self-pay | Admitting: Cardiovascular Disease

## 2017-09-10 ENCOUNTER — Inpatient Hospital Stay (HOSPITAL_COMMUNITY): Payer: 59

## 2017-09-10 ENCOUNTER — Encounter (HOSPITAL_COMMUNITY)
Admission: EM | Disposition: A | Discharge status: Home Health | Payer: Self-pay | Source: Home / Self Care | Attending: Internal Medicine

## 2017-09-10 DIAGNOSIS — N184 Chronic kidney disease, stage 4 (severe): Secondary | ICD-10-CM

## 2017-09-10 DIAGNOSIS — R7881 Bacteremia: Secondary | ICD-10-CM

## 2017-09-10 DIAGNOSIS — B9561 Methicillin susceptible Staphylococcus aureus infection as the cause of diseases classified elsewhere: Secondary | ICD-10-CM | POA: Clinically undetermined

## 2017-09-10 DIAGNOSIS — E7849 Other hyperlipidemia: Secondary | ICD-10-CM

## 2017-09-10 DIAGNOSIS — N179 Acute kidney failure, unspecified: Secondary | ICD-10-CM

## 2017-09-10 DIAGNOSIS — I10 Essential (primary) hypertension: Secondary | ICD-10-CM

## 2017-09-10 HISTORY — PX: TEE WITHOUT CARDIOVERSION: SHX5443

## 2017-09-10 HISTORY — DX: Bacteremia: R78.81

## 2017-09-10 HISTORY — DX: Methicillin susceptible Staphylococcus aureus infection as the cause of diseases classified elsewhere: B95.61

## 2017-09-10 LAB — BASIC METABOLIC PANEL
ANION GAP: 14 (ref 5–15)
BUN: 78 mg/dL — ABNORMAL HIGH (ref 6–20)
CO2: 21 mmol/L — ABNORMAL LOW (ref 22–32)
Calcium: 9.2 mg/dL (ref 8.9–10.3)
Chloride: 94 mmol/L — ABNORMAL LOW (ref 101–111)
Creatinine, Ser: 3.76 mg/dL — ABNORMAL HIGH (ref 0.61–1.24)
GFR calc Af Amer: 18 mL/min — ABNORMAL LOW (ref >60)
GFR, EST NON AFRICAN AMERICAN: 16 mL/min — AB (ref >60)
GLUCOSE: 323 mg/dL — AB (ref 65–99)
POTASSIUM: 5 mmol/L (ref 3.5–5.1)
Sodium: 129 mmol/L — ABNORMAL LOW (ref 135–145)

## 2017-09-10 LAB — CBC
HEMATOCRIT: 36.1 % — AB (ref 39.0–52.0)
HEMOGLOBIN: 12.4 g/dL — AB (ref 13.0–17.0)
MCH: 30 pg (ref 26.0–34.0)
MCHC: 34.3 g/dL (ref 30.0–36.0)
MCV: 87.2 fL (ref 78.0–100.0)
PLATELETS: 266 10*3/uL (ref 150–400)
RBC: 4.14 MIL/uL — AB (ref 4.22–5.81)
RDW: 13.2 % (ref 11.5–15.5)
WBC: 10.5 10*3/uL (ref 4.0–10.5)

## 2017-09-10 LAB — GLUCOSE, CAPILLARY
GLUCOSE-CAPILLARY: 307 mg/dL — AB (ref 65–99)
GLUCOSE-CAPILLARY: 132 mg/dL — AB (ref 65–99)
GLUCOSE-CAPILLARY: 292 mg/dL — AB (ref 65–99)
Glucose-Capillary: 348 mg/dL — ABNORMAL HIGH (ref 65–99)

## 2017-09-10 SURGERY — ECHOCARDIOGRAM, TRANSESOPHAGEAL
Anesthesia: Moderate Sedation

## 2017-09-10 MED ORDER — MIDAZOLAM HCL 5 MG/ML IJ SOLN
INTRAMUSCULAR | Status: AC
  Filled 2017-09-10: qty 2

## 2017-09-10 MED ORDER — BUTAMBEN-TETRACAINE-BENZOCAINE 2-2-14 % EX AERO
INHALATION_SPRAY | CUTANEOUS | Status: DC | PRN
  Administered 2017-09-10: 2 via TOPICAL

## 2017-09-10 MED ORDER — MIDAZOLAM HCL 10 MG/2ML IJ SOLN
INTRAMUSCULAR | Status: DC | PRN
  Administered 2017-09-10: 2 mg via INTRAVENOUS
  Administered 2017-09-10: 1 mg via INTRAVENOUS
  Administered 2017-09-10: 2 mg via INTRAVENOUS

## 2017-09-10 MED ORDER — FENTANYL CITRATE (PF) 100 MCG/2ML IJ SOLN
INTRAMUSCULAR | Status: DC | PRN
Start: 2017-09-10 — End: 2017-09-10
  Administered 2017-09-10 (×2): 25 ug via INTRAVENOUS

## 2017-09-10 MED ORDER — SENNOSIDES-DOCUSATE SODIUM 8.6-50 MG PO TABS
2.0000 | ORAL_TABLET | Freq: Two times a day (BID) | ORAL | Status: DC
  Administered 2017-09-10 – 2017-09-14 (×8): 2 via ORAL
  Filled 2017-09-10 (×8): qty 2

## 2017-09-10 MED ORDER — HEPARIN SODIUM (PORCINE) 5000 UNIT/ML IJ SOLN
5000.0000 [IU] | Freq: Three times a day (TID) | INTRAMUSCULAR | Status: DC
  Administered 2017-09-10 – 2017-09-14 (×11): 5000 [IU] via SUBCUTANEOUS
  Filled 2017-09-10 (×12): qty 1

## 2017-09-10 MED ORDER — SODIUM CHLORIDE 0.9 % IV SOLN
INTRAVENOUS | Status: DC
  Administered 2017-09-10: 14:00:00 via INTRAVENOUS

## 2017-09-10 MED ORDER — FENTANYL CITRATE (PF) 100 MCG/2ML IJ SOLN
INTRAMUSCULAR | Status: AC
  Filled 2017-09-10: qty 2

## 2017-09-10 NOTE — CV Procedure (Addendum)
Brief TEE Note  LVEF 55-60% No LA/LAA thrombus or mass Tiny, filamentous structure on the mitral and aortic valves.  Cannot rule out early endocarditis.  No large vegetations. Mild aortic valve calcification Trivial AR, PR Mild TR  John Beste [name], MD, [facility]  09/10/2017 2:39 PM   During this procedure the patient is administered a total of Versed 5 mg and Fentanyl 50 mcg to achieve and maintain moderate conscious sedation.  The patient's heart rate, blood pressure, and oxygen saturation are monitored continuously during the procedure. The period of conscious sedation is 15 minutes, of which I was present face-to-face 100% of this time.  For additional details see full report.   Jay Haskew [name], MD, [facility] 09/10/2017 2:40 PM

## 2017-09-10 NOTE — Progress Notes (Signed)
[facility] for Infectious Disease    Date of Admission:  09/06/2017   Total days of antibiotics 2        Day 2 cefazolin           ID: John [name] is a 66 y.o. male with history of OA, gout, CKD, DM who was admitted with 5 day history of fever plus acute back pain. MRI did not show evidence of discitis or infection, but concern for gouty arthritis due to elevated uric acid. He was given iv solumedrol yesterday which he felt improved his symptoms. His repeat blood cx from 10/16 show MSSA in 1 of 4 bottles at 48hrs growth.  Now concern for discitis Principal Problem:   Low back pain Active Problems:   Acute on chronic kidney failure (HCC)   DM (diabetes mellitus), type 2 with renal complications (HCC)   Essential hypertension   HLD (hyperlipidemia)   Back pain   MSSA bacteremia    Subjective:  Afebrile, back pain somewhat improved. underwent TEE which found    LVEF 55-60% No LA/LAA thrombus or mass Tiny, filamentous structure on the mitral and aortic valves.  Cannot rule out early endocarditis.  No large vegetations. Mild aortic valve calcification Trivial AR, PR Mild TR  Medications:  . amLODipine  5 mg Oral Daily  . aspirin EC  81 mg Oral Daily  . calcitRIOL  0.5 mcg Oral Daily  . gabapentin  300 mg Oral TID  . heparin subcutaneous  5,000 Units Subcutaneous Q8H  . insulin aspart  0-20 Units Subcutaneous TID WC  . ketotifen  1 drop Left Eye BID  . methylPREDNISolone (SOLU-MEDROL) injection  60 mg Intravenous Q12H  . metoprolol succinate  50 mg Oral Daily  . polyethylene glycol  17 g Oral Daily  . senna-docusate  2 tablet Oral BID  . simvastatin  40 mg Oral Daily  . tamsulosin  0.4 mg Oral QAC supper    Objective: Vital signs in last 24 hours: Temp:  [97.6 F (36.4 C)-98 F (36.7 C)] 97.7 F (36.5 C) (10/19 1445) Pulse Rate:  [63-76] 68 (10/19 1500) Resp:  [8-20] 12 (10/19 1500) BP: (97-146)/(63-91) 126/69 (10/19 1500) SpO2:  [94 %-100 %] 95 %  (10/19 1500) Weight:  [205 lb (93 kg)] 205 lb (93 kg) (10/19 1333) Physical Exam  Constitutional: He is oriented to person, place, and time. He appears well-developed and well-nourished. No distress.  HENT:  Mouth/Throat: Oropharynx is clear and moist. No oropharyngeal exudate.  Cardiovascular: Normal rate, regular rhythm and normal heart sounds. Exam reveals no gallop and no friction rub.  No murmur heard.  Pulmonary/Chest: Effort normal and breath sounds normal. No respiratory distress. He has no wheezes.  Neurological: He is alert and oriented to person, place, and time.  Skin: Skin is warm and dry. No rash noted. No erythema.  Psychiatric: He has a normal mood and affect. His behavior is normal.    Lab Results  Recent Labs  09/10/17 0505  WBC 10.5  HGB 12.4*  HCT 36.1*  NA 129*  K 5.0  CL 94*  CO2 21*  BUN 78*  CREATININE 3.76*   Microbiology: 10/18 blood cx pending 10/16 blood cx 1 of 2 MSSA 10/15 blood cx ngtd Studies/Results: No results found.   Assessment/Plan: mssa bacteremia with probable early native valve endocarditis  - repeat blood cx on Sunday - plan on continue on renally dosed cefazolin - place picc line once Sunday blood cx are  NGTD at 48hr  Will need 6 wk of IV abtx  Will see back on Monday Dr hatcher available for questions over the weekend  [facility], [facility] for Infectious Diseases Cell: [phone] Pager: [phone]  09/10/2017, 5:17 PM

## 2017-09-10 NOTE — Progress Notes (Signed)
    [facility] has been requested to perform a transesophageal echocardiogram on John [name] for bacteriemia. After careful review of history and examination, the risks and benefits of transesophageal echocardiogram have been explained including risks of esophageal damage, perforation (1:10,000 risk), bleeding, pharyngeal hematoma as well as other potential complications associated with conscious sedation including aspiration, arrhythmia, respiratory failure and death. Alternatives to treatment were discussed, questions were answered. Patient is willing to proceed.   Lyda Jester, PA-C  09/10/2017 11:37 AM

## 2017-09-10 NOTE — Progress Notes (Signed)
PROGRESS NOTE  John [name] GBE:[id number] DOB: 1951/04/20 DOA: 09/06/2017 PCP: Imagene Riches, NP  HPI/Recap of past 24 hours: John [name] is a 66 year old male with medical history significant for gout, osteoarthritis, cervical spine and lumbar fusion (remote history) CKD, type 2 diabetes who was admitted for acute back pain with fever of 102.5 Tachycardia low 100s.  Otherwise he had no other signs of infection,With no leukocytosis on CBC, UA negative for any signs of infection.  Initial concern for discitis; however CT of hip showed only a left hip but no acute abnormalities otherwise.  MRI of the lumbar spine showed "diffuse disc bulge from L2 through L4 and reactive marrow edema likely due to facet arthritis" but no evidence of discitis/osteomyelitis.  Likely differential included gouty arthritis of spine given elevated uric acid and clinical improvement with IV Solu-Medrol.  However patient's hospital course was complicated by 1 of 4 blood cultures growing MSSA.  TTE showed possible vegetation on aortic valve.  So given concern for potential discitis patient now awaiting TEE.  Patient is more he states his back pain has improved with initiation of IV steroids.  He denies any chest pain, shortness of breath, abdominal pain, lower extremity edema.  Also denies any fevers or chills.  Assessment/Plan: Principal Problem:   Low back pain Active Problems:   Acute on chronic kidney failure (HCC)   DM (diabetes mellitus), type 2 with renal complications (HCC)   Essential hypertension   HLD (hyperlipidemia)   Back pain  Acute low back pain and fever With MSSA in blood culture potential vegetation on aortic valve there is high concern for discitis/osteomyelitis.  Fever resolved.  Lumbar spine MR imaging shows no evidence of discitis -Given lack of a infections on MRI, IR sees no need for disc aspiration - Obtain TEE for further imaging (NPO for now) -Continue cefazolin,, will need to  taper IV Solu-Medrol - Infectious disease following   MSSA bacteremia Concern for infective endocarditis given possible mobile vegetation seen on TTE.Otherwise no physical exam stigmata, i.e. no lesions,.  Also no predisposing conditions  (IV drug use, back injection) - Repeat blood cultures this evening, follow blood cultures from 10/18 -Continue cefazolin  Type 2 diabetes, poorly controlled Likely worsening control in the setting of IV steroid use -Hold home oral hypoglycemics, start sliding scale insulin and monitor glucose closely  Acute on chronic kidney failure Last known creatinine baseline in July 2010 2.74.  On admission creatinine of 4.02 BUN 48.  Patient's creatinine to 3.76 currently. Followed by John [name] nephrologist -Monitor intake and output, avoid nephrotoxins (no NSAID use for possible gouty arthritis) - Will discuss with his nephrologist to determine if acute change or consistent with previous baseline  Constipation -Optimize bowel regimen (miralax, increase senna-docusate to BID)  Hypertension, controlled - metoprolol, amlodipine  CAD s/p DES Hypertension - metoprolol, aspirin, statin  Intrabadominal Aneurysm Measures 4.2 cm -Recommend follow up as outpatient for repeat imaging in a year  Code Status: Full Code   Family Communication: No family at bedside   Disposition Plan: Pending TEE and resolution of bacteremia.     Consultants:  Infectious Disease, John [name]    Procedures: 09/09/18 TTE: EF 55-60%. Grade 1 diastolic dysfunction. "- Aortic valve: ECHO dense small mass on aortic valve that could be   calcium or small vegetation. It is nonmobile and is upstream on the valve. Suggest TEE to further evaluate.  Antimicrobials:  Cefazolin 10/18-Current  DVT prophylaxis:  Heparin  Objective: Vitals:   09/09/17 1325 09/09/17 2256 09/10/17 0432 09/10/17 0432  BP: (!) 141/85 137/81 136/74 136/74  Pulse: 64 76 66 66  Resp: 14  18 18 18   Temp:  98 F (36.7 C) 97.6 F (36.4 C) 97.6 F (36.4 C)  TempSrc:  Oral Oral Oral  SpO2: 98% 97% 97% 97%  Weight:      Height:        Intake/Output Summary (Last 24 hours) at 09/10/17 1109 Last data filed at 09/10/17 0300  Gross per 24 hour  Intake              100 ml  Output                0 ml  Net              100 ml   Filed Weights   09/07/17 1628  Weight: 93.5 kg (206 lb 1.6 oz)    Exam:   General: Lying in bed comfortably, pleasant in conversation, in no apparent distress  Cardiovascular: Regular rate and rhythm, no murmurs, no rubs, or gallops, no JVD, no peripheral edema is   Respiratory: lungs clear to auscultation bilaterally no rales or wheezing, room air  Abdomen: Soft, nondistended, nontender, normoactive bowel sounds  Musculoskeletal: Normal range of motion  Skin: Dry, intact  Psychiatry: Normal affect and mood   Data Reviewed: CBC:  Recent Labs Lab 09/06/17 1245 09/07/17 0629 09/10/17 0505  WBC 11.3* 8.5 10.5  NEUTROABS 8.8*  --   --   HGB 13.2 12.0* 12.4*  HCT 38.7* 36.6* 36.1*  MCV 89.4 89.3 87.2  PLT 179 162 665   Basic Metabolic Panel:  Recent Labs Lab 09/06/17 1245 09/07/17 0629 09/10/17 0505  NA 131* 133* 129*  K 4.2 4.2 5.0  CL 98* 101 94*  CO2 21* 21* 21*  GLUCOSE 193* 212* 323*  BUN 48* 54* 78*  CREATININE 4.02* 4.15* 3.76*  CALCIUM 9.2 9.0 9.2   GFR: Estimated Creatinine Clearance: 23.4 mL/min (A) (by C-G formula based on SCr of 3.76 mg/dL (H)). Liver Function Tests:  Recent Labs Lab 09/06/17 1245  AST 15  ALT 20  ALKPHOS 80  BILITOT 0.6  PROT 7.3  ALBUMIN 3.5   No results for input(s): LIPASE, AMYLASE in the last 168 hours. No results for input(s): AMMONIA in the last 168 hours. Coagulation Profile:  Recent Labs Lab 09/06/17 1245  INR 1.02   Cardiac Enzymes:  Recent Labs Lab 09/07/17 0629  CKTOTAL 51   BNP (last 3 results) No results for input(s): PROBNP in the last 8760  hours. HbA1C: No results for input(s): HGBA1C in the last 72 hours. CBG:  Recent Labs Lab 09/09/17 0748 09/09/17 1016 09/09/17 1230 09/09/17 1659 09/10/17 0804  GLUCAP 289* 328* 308* 204* 348*   Lipid Profile: No results for input(s): CHOL, HDL, LDLCALC, TRIG, CHOLHDL, LDLDIRECT in the last 72 hours. Thyroid Function Tests: No results for input(s): TSH, T4TOTAL, FREET4, T3FREE, THYROIDAB in the last 72 hours. Anemia Panel: No results for input(s): VITAMINB12, FOLATE, FERRITIN, TIBC, IRON, RETICCTPCT in the last 72 hours. Urine analysis:    Component Value Date/Time   COLORURINE STRAW (A) 09/06/2017 [place] 09/06/2017 1246   LABSPEC 1.010 09/06/2017 1246   PHURINE 6.0 09/06/2017 1246   GLUCOSEU >=500 (A) 09/06/2017 1246   HGBUR NEGATIVE 09/06/2017 [place] 09/06/2017 1246   KETONESUR NEGATIVE 09/06/2017 1246   PROTEINUR 100 (A) 09/06/2017  [place] 09/06/2017 [place] 09/06/2017 1246   Sepsis Labs: @LABRCNTIP (procalcitonin:4,lacticidven:4)  ) Recent Results (from the past 240 hour(s))  Culture, blood (routine x 2)     Status: None (Preliminary result)   Collection Time: 09/06/17  6:50 PM  Result Value Ref Range Status   Specimen Description BLOOD RIGHT ANTECUBITAL  Final   Special Requests   Final    BOTTLES DRAWN AEROBIC AND ANAEROBIC Blood Culture results may not be optimal due to an excessive volume of blood received in culture bottles   Culture NO GROWTH 3 DAYS  Final   Report Status PENDING  Incomplete  Culture, blood (routine x 2)     Status: None (Preliminary result)   Collection Time: 09/07/17  2:50 PM  Result Value Ref Range Status   Specimen Description BLOOD RIGHT HAND  Final   Special Requests   Final    BOTTLES DRAWN AEROBIC AND ANAEROBIC Blood Culture adequate volume   Culture NO GROWTH 2 DAYS  Final   Report Status PENDING  Incomplete  Culture, blood (routine x 2)     Status:  Abnormal (Preliminary result)   Collection Time: 09/07/17  2:59 PM  Result Value Ref Range Status   Specimen Description BLOOD LEFT ANTECUBITAL  Final   Special Requests   Final    BOTTLES DRAWN AEROBIC AND ANAEROBIC Blood Culture results may not be optimal due to an excessive volume of blood received in culture bottles   Culture  Setup Time   Final    GRAM POSITIVE COCCI IN CLUSTERS AEROBIC BOTTLE ONLY CRITICAL RESULT CALLED TO, READ BACK BY AND VERIFIED WITH: Alvino Chapel.D. 12:35 09/09/17 ([email or web address])    Culture (A)  Final    STAPHYLOCOCCUS AUREUS SUSCEPTIBILITIES TO FOLLOW    Report Status PENDING  Incomplete  Blood Culture ID Panel (Reflexed)     Status: Abnormal   Collection Time: 09/07/17  2:59 PM  Result Value Ref Range Status   Enterococcus species NOT DETECTED NOT DETECTED Final   Listeria monocytogenes NOT DETECTED NOT DETECTED Final   Staphylococcus species DETECTED (A) NOT DETECTED Final    Comment: CRITICAL RESULT CALLED TO, READ BACK BY AND VERIFIED WITH: Alvino Chapel.D. 12:35 09/09/17 ([email or web address])    Staphylococcus aureus DETECTED (A) NOT DETECTED Final    Comment: Methicillin (oxacillin) susceptible Staphylococcus aureus (MSSA). Preferred therapy is anti staphylococcal beta lactam antibiotic (Cefazolin or Nafcillin), unless clinically contraindicated. CRITICAL RESULT CALLED TO, READ BACK BY AND VERIFIED WITH: Alvino Chapel.D. 12:35 09/09/17 ([email or web address])    Methicillin resistance NOT DETECTED NOT DETECTED Final   Streptococcus species NOT DETECTED NOT DETECTED Final   Streptococcus agalactiae NOT DETECTED NOT DETECTED Final   Streptococcus pneumoniae NOT DETECTED NOT DETECTED Final   Streptococcus pyogenes NOT DETECTED NOT DETECTED Final   Acinetobacter baumannii NOT DETECTED NOT DETECTED Final   Enterobacteriaceae species NOT DETECTED NOT DETECTED Final   Enterobacter cloacae complex NOT DETECTED NOT DETECTED Final   Escherichia coli NOT DETECTED NOT DETECTED  Final   Klebsiella oxytoca NOT DETECTED NOT DETECTED Final   Klebsiella pneumoniae NOT DETECTED NOT DETECTED Final   Proteus species NOT DETECTED NOT DETECTED Final   Serratia marcescens NOT DETECTED NOT DETECTED Final   Haemophilus influenzae NOT DETECTED NOT DETECTED Final   Neisseria meningitidis NOT DETECTED NOT DETECTED Final   Pseudomonas aeruginosa NOT DETECTED NOT DETECTED Final   Candida albicans NOT DETECTED NOT DETECTED Final   Candida glabrata NOT DETECTED  NOT DETECTED Final   Candida krusei NOT DETECTED NOT DETECTED Final   Candida parapsilosis NOT DETECTED NOT DETECTED Final   Candida tropicalis NOT DETECTED NOT DETECTED Final      Studies: No results found.  Scheduled Meds: . amLODipine  5 mg Oral Daily  . aspirin EC  81 mg Oral Daily  . calcitRIOL  0.5 mcg Oral Daily  . gabapentin  300 mg Oral TID  . heparin subcutaneous  5,000 Units Subcutaneous Q8H  . insulin aspart  0-20 Units Subcutaneous TID WC  . ketotifen  1 drop Left Eye BID  . methylPREDNISolone (SOLU-MEDROL) injection  60 mg Intravenous Q12H  . metoprolol succinate  50 mg Oral Daily  . polyethylene glycol  17 g Oral Daily  . senna-docusate  1 tablet Oral BID  . simvastatin  40 mg Oral Daily  . tamsulosin  0.4 mg Oral QAC supper    Continuous Infusions: .  ceFAZolin (ANCEF) IV Stopped (09/10/17 0310)     LOS: 1 day     Desiree Hane, MD [facility] Pager [phone]  If 7PM-7AM, please contact night-coverage [email or web address] Password [email or web address] 09/10/2017, 11:09 AM

## 2017-09-10 NOTE — H&P (Signed)
John [name] is a 66 y.o. male who has presented today for surgery, with the diagnosis of fever of unknown origen. The various methods of treatment have been discussed with the patient and family. After consideration of risks, benefits and other options for treatment, the patient has consented to Procedure(s): TRANSESOPHAGEAL ECHOCARDIOGRAM (TEE) (N/A) as a surgical intervention . The patient's history has been reviewed, patient examined, no change in status, stable for surgery. I have reviewed the patient's chart and labs. Questions were answered to the patient's satisfaction.   [name], MD, [facility]  09/10/2017 2:03 PM

## 2017-09-10 NOTE — Progress Notes (Signed)
Results for CAIGE, ALMEDA (MRN [id number]) as of 09/10/2017 12:48  Ref. Range 09/09/2017 10:16 09/09/2017 12:30 09/09/2017 16:59 09/10/2017 08:04 09/10/2017 11:55  Glucose-Capillary Latest Ref Range: 65 - 99 mg/dL 328 (H) 308 (H) 204 (H) 348 (H) 307 (H)  Noted that blood sugars continue to be greater than 180 mg/dl. Recommend adding Lantus 10-15 units daily if blood sugar continue to be elevated. Will continue to monitor while in the hospital.   Harvel Ricks RN BSN CDE Diabetes Coordinator Pager: [phone]  8am-5pm

## 2017-09-11 DIAGNOSIS — I330 Acute and subacute infective endocarditis: Principal | ICD-10-CM

## 2017-09-11 LAB — CULTURE, BLOOD (ROUTINE X 2): CULTURE: NO GROWTH

## 2017-09-11 LAB — BASIC METABOLIC PANEL
ANION GAP: 15 (ref 5–15)
BUN: 92 mg/dL — AB (ref 6–20)
CHLORIDE: 95 mmol/L — AB (ref 101–111)
CO2: 21 mmol/L — AB (ref 22–32)
Calcium: 9.4 mg/dL (ref 8.9–10.3)
Creatinine, Ser: 4.08 mg/dL — ABNORMAL HIGH (ref 0.61–1.24)
GFR calc Af Amer: 17 mL/min — ABNORMAL LOW (ref >60)
GFR, EST NON AFRICAN AMERICAN: 14 mL/min — AB (ref >60)
GLUCOSE: 305 mg/dL — AB (ref 65–99)
POTASSIUM: 4.4 mmol/L (ref 3.5–5.1)
Sodium: 131 mmol/L — ABNORMAL LOW (ref 135–145)

## 2017-09-11 LAB — GLUCOSE, CAPILLARY
GLUCOSE-CAPILLARY: 269 mg/dL — AB (ref 65–99)
Glucose-Capillary: 286 mg/dL — ABNORMAL HIGH (ref 65–99)
Glucose-Capillary: 240 mg/dL — ABNORMAL HIGH (ref 65–99)
Glucose-Capillary: 242 mg/dL — ABNORMAL HIGH (ref 65–99)

## 2017-09-11 MED ORDER — SODIUM CHLORIDE 0.9 % IV SOLN
INTRAVENOUS | Status: AC
  Administered 2017-09-11 – 2017-09-12 (×3): via INTRAVENOUS

## 2017-09-11 MED ORDER — METHYLPREDNISOLONE SODIUM SUCC 40 MG IJ SOLR
20.0000 mg | Freq: Two times a day (BID) | INTRAMUSCULAR | Status: AC
  Administered 2017-09-12 – 2017-09-13 (×3): 20 mg via INTRAVENOUS
  Filled 2017-09-11 (×3): qty 1

## 2017-09-11 MED ORDER — METHYLPREDNISOLONE SODIUM SUCC 40 MG IJ SOLR
40.0000 mg | Freq: Two times a day (BID) | INTRAMUSCULAR | Status: DC
  Administered 2017-09-11: 40 mg via INTRAVENOUS
  Filled 2017-09-11: qty 1

## 2017-09-11 MED ORDER — INSULIN GLARGINE 100 UNIT/ML ~~LOC~~ SOLN
15.0000 [IU] | Freq: Every day | SUBCUTANEOUS | Status: DC
  Administered 2017-09-11 – 2017-09-14 (×5): 15 [IU] via SUBCUTANEOUS
  Filled 2017-09-11 (×5): qty 0.15

## 2017-09-11 NOTE — Progress Notes (Signed)
PROGRESS NOTE  John [name] QJF:[id number] DOB: December 04, 1950 DOA: 09/06/2017 PCP: Imagene Riches, NP  HPI/Recap of past 24 hours: John [name] is a 66 year old male with medical history significant for gout, osteoarthritis, cervical spine and lumbar fusion (remote history) CKD, type 2 diabetes who was admitted for acute back pain with fever of 102.5, tachycardic to low 100s.  Patient presentation was concerning for discitis however MRI of lumbar spine showed no nidus of infection and patient was treated appropriately with IV steroids for possible gouty arthritis of the spine given elevated uric acid and clinical improvement with supportive care.  Hospital course was complicated by growth of MSSA and admission blood culture.  Patient was started on cefazolin and TTE was obtained which showed possible vegetation on aortic valve.  TEE on October 19 concerning for small vegetation of mitral and aortic valve prompting treatment for native valve infective endocarditis.   This am patient upset about hearing beeping on medication machine. Otherwise reports some changes in back pain: moving to lower back.   Assessment/Plan: Principal Problem:   Low back pain Active Problems:   Acute on chronic kidney failure (HCC)   DM (diabetes mellitus), type 2 with renal complications (HCC)   Essential hypertension   HLD (hyperlipidemia)   Back pain   MSSA bacteremia   MSSA bacteremia  Probable early native valve infective endocarditis, mitral and aortic valve TEE showed small vegetation on aortic valve. No other sequela of septic emboli phenomenon on imaging or on physical exam - f/u serial blood cultures if remain negative plan for long term IV access to complete antibiotic therapty -Continue cefazolin - ID following, rec 6 week course of iv antibiotics  Acute low back pain and fever, likely septic sequela of infective endocarditis With MSSA in blood culture,l vegetation on aortic and mitral valve  there is high concern for discitis/osteomyelitis.  MRI lumbar spine showed no discitis.  IR see no nidus of infection on MRI lumbar spine so sees no need for disc aspiration -Continue cefazolin,, continue tapering IV Solu-Medrol ( will end in 2 days) - Infectious disease following  Type 2 diabetes, poorly controlled Likely worsening control in the setting of IV steroid use -Hold home oral hypoglycemics, start lantus 15 U, sliding scale insulin and monitor glucose closely, will decrease IV steroid dosing  Acute on chronic kidney failure,worsening Last known creatinine baseline in July 2010 2.74.  On admission creatinine of 4.02 BUN 48.  Patient's creatinine to 4.08 currently. Likely related to poor po/fluid intake related to NPO status on yesterday 10/19 - Followed by Dr. 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     Consultants:  Infectious Disease, Dr. Carlyle Basques   Procedures: 09/09/18 TTE: EF 55-60%. Grade 1 diastolic dysfunction. "- Aortic valve: ECHO dense small mass on aortic valve that could be   calcium or small vegetation. It is nonmobile and is  upstream on the valve. Suggest TEE to further evaluate.  09/10/18 TEE : EF 55-60%.  "Aortic valve: There was a 1.0 cm (L) x 0.2 cm (W),  strand-like  echodensity intermittently seen in the LVOT. It is unclear if it  is attached to the aortic valve. Mitral Valve Very small 0.6 cm (L) x 0.2 cm (W)  filamentous hypodensity intermittently seen on the atrial side of  the anterior leaflet.  Impression: Very small, strand-like echodensities were noted on the mitral and aortic valves. Cannot rule out early endocarditis. No clear   vegetations were noted  Antimicrobials:  Cefazolin 10/18-Current  DVT prophylaxis:  Heparin   Objective: Vitals:   09/10/17 1455 09/10/17 1500 09/10/17 2137 09/11/17 0505  BP: 106/70 126/69 (!) 159/92 116/75  Pulse: 64 68 71 77  Resp: 13 12 16 16   Temp:   98.1 F (36.7 C) 97.9 F (36.6 C)  TempSrc:   Oral Oral  SpO2: 97% 95% 97% 95%  Weight:      Height:        Intake/Output Summary (Last 24 hours) at 09/11/17 0720 Last data filed at 09/11/17 0400  Gross per 24 hour  Intake               50 ml  Output                0 ml  Net               50 ml   Filed Weights   09/07/17 1628 09/10/17 1333  Weight: 93.5 kg (206 lb 1.6 oz) 93 kg (205 lb)    Exam:   General: sitting in bed comfortably, in no apparent distress  Cardiovascular: Regular rate and rhythm, no murmurs, no rubs, or gallops, no JVD, no peripheral edema   Musculoskeletal: tendernesss to palpation of right lower back  Skin: Dry, intact  Psychiatry: Normal affect and mood   Data Reviewed: CBC:  Recent Labs Lab 09/06/17 1245 09/07/17 0629 09/10/17 0505  WBC 11.3* 8.5 10.5  NEUTROABS 8.8*  --   --   HGB 13.2 12.0* 12.4*  HCT 38.7* 36.6* 36.1*  MCV 89.4 89.3 87.2  PLT 179 162 671   Basic Metabolic Panel:  Recent Labs Lab 09/06/17 1245 09/07/17 0629 09/10/17 0505 09/11/17 0410  NA 131* 133* 129* 131*  K 4.2 4.2 5.0 4.4  CL 98* 101 94* 95*  CO2 21* 21* 21* 21*  GLUCOSE 193* 212* 323* 305*  BUN 48* 54* 78* 92*  CREATININE 4.02* 4.15* 3.76* 4.08*  CALCIUM 9.2 9.0 9.2 9.4   GFR: Estimated Creatinine  Clearance: 21.5 mL/min (A) (by C-G formula based on SCr of 4.08 mg/dL (H)). Liver Function Tests:  Recent Labs Lab 09/06/17 1245  AST 15  ALT 20  ALKPHOS 80  BILITOT 0.6  PROT 7.3  ALBUMIN 3.5   No results for input(s): LIPASE, AMYLASE in the last 168 hours. No results for input(s): AMMONIA in the last 168 hours. Coagulation Profile:  Recent Labs Lab 09/06/17 1245  INR 1.02   Cardiac Enzymes:  Recent Labs Lab 09/07/17 0629  CKTOTAL 51   BNP (last 3 results) No results for input(s): PROBNP in the last 8760 hours. HbA1C: No results for input(s): HGBA1C in the last 72 hours. CBG:  Recent Labs Lab 09/09/17 1659 09/10/17 0804 09/10/17 1155 09/10/17 1624 09/10/17 2116  GLUCAP 204* 348* 307* 132* 292*   Lipid Profile: No results for input(s): CHOL, HDL, LDLCALC, TRIG, CHOLHDL,  LDLDIRECT in the last 72 hours. Thyroid Function Tests: No results for input(s): TSH, T4TOTAL, FREET4, T3FREE, THYROIDAB in the last 72 hours. Anemia Panel: No results for input(s): VITAMINB12, FOLATE, FERRITIN, TIBC, IRON, RETICCTPCT in the last 72 hours. Urine analysis:    Component Value Date/Time   COLORURINE STRAW (A) 09/06/2017 [place] 09/06/2017 1246   LABSPEC 1.010 09/06/2017 1246   PHURINE 6.0 09/06/2017 1246   GLUCOSEU >=500 (A) 09/06/2017 1246   HGBUR NEGATIVE 09/06/2017 [place] 09/06/2017 [place] 09/06/2017 1246   PROTEINUR 100 (A) 09/06/2017 1246   NITRITE NEGATIVE 09/06/2017 1246   LEUKOCYTESUR NEGATIVE 09/06/2017 1246   Sepsis Labs: @LABRCNTIP (procalcitonin:4,lacticidven:4)  ) Recent Results (from the past 240 hour(s))  Culture, blood (routine x 2)     Status: None (Preliminary result)   Collection Time: 09/06/17  6:50 PM  Result Value Ref Range Status   Specimen Description BLOOD RIGHT ANTECUBITAL  Final   Special Requests   Final    BOTTLES DRAWN AEROBIC AND ANAEROBIC Blood Culture results may not be  optimal due to an excessive volume of blood received in culture bottles   Culture NO GROWTH 4 DAYS  Final   Report Status PENDING  Incomplete  Culture, blood (routine x 2)     Status: None (Preliminary result)   Collection Time: 09/07/17  2:50 PM  Result Value Ref Range Status   Specimen Description BLOOD RIGHT HAND  Final   Special Requests   Final    BOTTLES DRAWN AEROBIC AND ANAEROBIC Blood Culture adequate volume   Culture NO GROWTH 3 DAYS  Final   Report Status PENDING  Incomplete  Culture, blood (routine x 2)     Status: Abnormal (Preliminary result)   Collection Time: 09/07/17  2:59 PM  Result Value Ref Range Status   Specimen Description BLOOD LEFT ANTECUBITAL  Final   Special Requests   Final    BOTTLES DRAWN AEROBIC AND ANAEROBIC Blood Culture results may not be optimal due to an excessive volume of blood received in culture bottles   Culture  Setup Time   Final    GRAM POSITIVE COCCI IN CLUSTERS AEROBIC BOTTLE ONLY CRITICAL RESULT CALLED TO, READ BACK BY AND VERIFIED WITH: Alvino Chapel.D. 12:35 09/09/17 ([email or web address])    Culture (A)  Final    STAPHYLOCOCCUS AUREUS SUSCEPTIBILITIES TO FOLLOW    Report Status PENDING  Incomplete  Blood Culture ID Panel (Reflexed)     Status: Abnormal   Collection Time: 09/07/17  2:59 PM  Result Value Ref Range Status   Enterococcus species NOT DETECTED NOT DETECTED Final   Listeria monocytogenes NOT DETECTED NOT DETECTED Final   Staphylococcus species DETECTED (A) NOT DETECTED Final    Comment: CRITICAL RESULT CALLED TO, READ BACK BY AND VERIFIED WITH: Alvino Chapel.D. 12:35 09/09/17 ([email or web address])    Staphylococcus aureus DETECTED (A) NOT DETECTED Final    Comment: Methicillin (oxacillin) susceptible Staphylococcus aureus (MSSA). Preferred therapy is anti staphylococcal beta lactam antibiotic (Cefazolin or Nafcillin), unless clinically contraindicated. CRITICAL RESULT CALLED TO, READ BACK BY AND VERIFIED WITH: Alvino Chapel.D. 12:35  09/09/17 ([email or web address])    Methicillin resistance NOT DETECTED NOT DETECTED Final   Streptococcus species NOT DETECTED NOT DETECTED Final   Streptococcus agalactiae NOT DETECTED NOT DETECTED Final   Streptococcus pneumoniae NOT DETECTED NOT DETECTED Final   Streptococcus pyogenes NOT DETECTED NOT DETECTED Final   Acinetobacter baumannii NOT DETECTED NOT DETECTED Final  Enterobacteriaceae species NOT DETECTED NOT DETECTED Final   Enterobacter cloacae complex NOT DETECTED NOT DETECTED Final   Escherichia coli NOT DETECTED NOT DETECTED Final   Klebsiella oxytoca NOT DETECTED NOT DETECTED Final   Klebsiella pneumoniae NOT DETECTED NOT DETECTED Final   Proteus species NOT DETECTED NOT DETECTED Final   Serratia marcescens NOT DETECTED NOT DETECTED Final   Haemophilus influenzae NOT DETECTED NOT DETECTED Final   Neisseria meningitidis NOT DETECTED NOT DETECTED Final   Pseudomonas aeruginosa NOT DETECTED NOT DETECTED Final   Candida albicans NOT DETECTED NOT DETECTED Final   Candida glabrata NOT DETECTED NOT DETECTED Final   Candida krusei NOT DETECTED NOT DETECTED Final   Candida parapsilosis NOT DETECTED NOT DETECTED Final   Candida tropicalis NOT DETECTED NOT DETECTED Final  Culture, blood (Routine X 2) w Reflex to ID Panel     Status: None (Preliminary result)   Collection Time: 09/09/17  7:18 PM  Result Value Ref Range Status   Specimen Description BLOOD LEFT ANTECUBITAL  Final   Special Requests IN PEDIATRIC BOTTLE Blood Culture adequate volume  Final   Culture NO GROWTH < 24 HOURS  Final   Report Status PENDING  Incomplete  Culture, blood (Routine X 2) w Reflex to ID Panel     Status: None (Preliminary result)   Collection Time: 09/09/17  7:18 PM  Result Value Ref Range Status   Specimen Description BLOOD BLOOD LEFT FOREARM  Final   Special Requests IN PEDIATRIC BOTTLE Blood Culture adequate volume  Final   Culture NO GROWTH < 24 HOURS  Final   Report Status PENDING  Incomplete       Studies: No results found.  Scheduled Meds: . amLODipine  5 mg Oral Daily  . aspirin EC  81 mg Oral Daily  . calcitRIOL  0.5 mcg Oral Daily  . gabapentin  300 mg Oral TID  . heparin subcutaneous  5,000 Units Subcutaneous Q8H  . insulin aspart  0-20 Units Subcutaneous TID WC  . ketotifen  1 drop Left Eye BID  . methylPREDNISolone (SOLU-MEDROL) injection  60 mg Intravenous Q12H  . metoprolol succinate  50 mg Oral Daily  . polyethylene glycol  17 g Oral Daily  . senna-docusate  2 tablet Oral BID  . simvastatin  40 mg Oral Daily  . tamsulosin  0.4 mg Oral QAC supper    Continuous Infusions: .  ceFAZolin (ANCEF) IV Stopped (09/10/17 2048)     LOS: 2 days     Desiree Hane, MD [facility] Pager [phone]  If 7PM-7AM, please contact night-coverage [email or web address] Password [email or web address] 09/11/2017, 7:20 AM

## 2017-09-12 ENCOUNTER — Encounter (HOSPITAL_COMMUNITY): Payer: Self-pay | Admitting: Cardiovascular Disease

## 2017-09-12 DIAGNOSIS — B9561 Methicillin susceptible Staphylococcus aureus infection as the cause of diseases classified elsewhere: Secondary | ICD-10-CM

## 2017-09-12 DIAGNOSIS — Z880 Allergy status to penicillin: Secondary | ICD-10-CM

## 2017-09-12 LAB — CULTURE, BLOOD (ROUTINE X 2)
Culture: NO GROWTH
Special Requests: ADEQUATE

## 2017-09-12 LAB — BASIC METABOLIC PANEL
ANION GAP: 13 (ref 5–15)
BUN: 84 mg/dL — ABNORMAL HIGH (ref 6–20)
CHLORIDE: 106 mmol/L (ref 101–111)
CO2: 17 mmol/L — AB (ref 22–32)
Calcium: 8.9 mg/dL (ref 8.9–10.3)
Creatinine, Ser: 3.55 mg/dL — ABNORMAL HIGH (ref 0.61–1.24)
GFR calc Af Amer: 20 mL/min — ABNORMAL LOW (ref >60)
GFR calc non Af Amer: 17 mL/min — ABNORMAL LOW (ref >60)
GLUCOSE: 201 mg/dL — AB (ref 65–99)
Potassium: 4.6 mmol/L (ref 3.5–5.1)
Sodium: 136 mmol/L (ref 135–145)

## 2017-09-12 LAB — CBC
HEMATOCRIT: 38.5 % — AB (ref 39.0–52.0)
HEMOGLOBIN: 13 g/dL (ref 13.0–17.0)
MCH: 29.9 pg (ref 26.0–34.0)
MCHC: 33.8 g/dL (ref 30.0–36.0)
MCV: 88.5 fL (ref 78.0–100.0)
Platelets: 280 10*3/uL (ref 150–400)
RBC: 4.35 MIL/uL (ref 4.22–5.81)
RDW: 13.2 % (ref 11.5–15.5)
WBC: 12.8 10*3/uL — ABNORMAL HIGH (ref 4.0–10.5)

## 2017-09-12 LAB — GLUCOSE, CAPILLARY
GLUCOSE-CAPILLARY: 145 mg/dL — AB (ref 65–99)
GLUCOSE-CAPILLARY: 250 mg/dL — AB (ref 65–99)
Glucose-Capillary: 297 mg/dL — ABNORMAL HIGH (ref 65–99)
Glucose-Capillary: 224 mg/dL — ABNORMAL HIGH (ref 65–99)

## 2017-09-12 NOTE — Progress Notes (Signed)
INFECTIOUS DISEASE PROGRESS NOTE  ID: John [name] is a 66 y.o. male with  Principal Problem:   Low back pain Active Problems:   Acute on chronic kidney failure (HCC)   DM (diabetes mellitus), type 2 with renal complications (HCC)   Essential hypertension   HLD (hyperlipidemia)   Back pain   MSSA bacteremia  Subjective: No complaints.   Abtx:  Anti-infectives    Start     Dose/Rate Route Frequency Ordered Stop   09/09/17 1400  ceFAZolin (ANCEF) IVPB 1 g/50 mL premix     1 g 100 mL/hr over 30 Minutes Intravenous Every 12 hours 09/09/17 1254        Medications:  Scheduled: . amLODipine  5 mg Oral Daily  . aspirin EC  81 mg Oral Daily  . calcitRIOL  0.5 mcg Oral Daily  . gabapentin  300 mg Oral TID  . heparin subcutaneous  5,000 Units Subcutaneous Q8H  . insulin aspart  0-20 Units Subcutaneous TID WC  . insulin glargine  15 Units Subcutaneous Daily  . ketotifen  1 drop Left Eye BID  . methylPREDNISolone (SOLU-MEDROL) injection  20 mg Intravenous Q12H  . metoprolol succinate  50 mg Oral Daily  . polyethylene glycol  17 g Oral Daily  . senna-docusate  2 tablet Oral BID  . simvastatin  40 mg Oral Daily  . tamsulosin  0.4 mg Oral QAC supper    Objective: Vital signs in last 24 hours: Temp:  [97.7 F (36.5 C)-97.8 F (36.6 C)] 97.7 F (36.5 C) (10/21 0555) Pulse Rate:  [66-70] 66 (10/21 0555) Resp:  [16-20] 16 (10/21 0555) BP: (146-157)/(81-90) 157/90 (10/21 0555) SpO2:  [96 %-98 %] 96 % (10/21 0830)   General appearance: alert, cooperative and no distress Resp: clear to auscultation bilaterally Cardio: regular rate and rhythm GI: normal findings: bowel sounds normal and soft, non-tender  Lab Results  Recent Labs  09/10/17 0505 09/11/17 0410 09/12/17 0923  WBC 10.5  --  12.8*  HGB 12.4*  --  13.0  HCT 36.1*  --  38.5*  NA 129* 131* 136  K 5.0 4.4 4.6  CL 94* 95* 106  CO2 21* 21* 17*  BUN 78* 92* 84*  CREATININE 3.76* 4.08* 3.55*   Liver  Panel No results for input(s): PROT, ALBUMIN, AST, ALT, ALKPHOS, BILITOT, BILIDIR, IBILI in the last 72 hours. Sedimentation Rate No results for input(s): ESRSEDRATE in the last 72 hours. C-Reactive Protein No results for input(s): CRP in the last 72 hours.  Microbiology: Recent Results (from the past 240 hour(s))  Culture, blood (routine x 2)     Status: None   Collection Time: 09/06/17  6:50 PM  Result Value Ref Range Status   Specimen Description BLOOD RIGHT ANTECUBITAL  Final   Special Requests   Final    BOTTLES DRAWN AEROBIC AND ANAEROBIC Blood Culture results may not be optimal due to an excessive volume of blood received in culture bottles   Culture NO GROWTH 5 DAYS  Final   Report Status 09/11/2017 FINAL  Final  Culture, blood (routine x 2)     Status: None (Preliminary result)   Collection Time: 09/07/17  2:50 PM  Result Value Ref Range Status   Specimen Description BLOOD RIGHT HAND  Final   Special Requests   Final    BOTTLES DRAWN AEROBIC AND ANAEROBIC Blood Culture adequate volume   Culture NO GROWTH 4 DAYS  Final   Report Status PENDING  Incomplete  Culture, blood (routine x 2)     Status: Abnormal   Collection Time: 09/07/17  2:59 PM  Result Value Ref Range Status   Specimen Description BLOOD LEFT ANTECUBITAL  Final   Special Requests   Final    BOTTLES DRAWN AEROBIC AND ANAEROBIC Blood Culture results may not be optimal due to an excessive volume of blood received in culture bottles   Culture  Setup Time   Final    GRAM POSITIVE COCCI IN CLUSTERS AEROBIC BOTTLE ONLY CRITICAL RESULT CALLED TO, READ BACK BY AND VERIFIED WITH: Alvino Chapel.D. 12:35 09/09/17 ([email or web address])    Culture STAPHYLOCOCCUS AUREUS (A)  Final   Report Status 09/11/2017 FINAL  Final   Organism ID, Bacteria STAPHYLOCOCCUS AUREUS  Final      Susceptibility   Staphylococcus aureus - MIC*    CIPROFLOXACIN <=0.5 SENSITIVE Sensitive     ERYTHROMYCIN <=0.25 SENSITIVE Sensitive     GENTAMICIN <=0.5  SENSITIVE Sensitive     OXACILLIN 0.5 SENSITIVE Sensitive     TETRACYCLINE <=1 SENSITIVE Sensitive     VANCOMYCIN <=0.5 SENSITIVE Sensitive     TRIMETH/SULFA <=10 SENSITIVE Sensitive     CLINDAMYCIN <=0.25 SENSITIVE Sensitive     RIFAMPIN <=0.5 SENSITIVE Sensitive     Inducible Clindamycin NEGATIVE Sensitive     * STAPHYLOCOCCUS AUREUS  Blood Culture ID Panel (Reflexed)     Status: Abnormal   Collection Time: 09/07/17  2:59 PM  Result Value Ref Range Status   Enterococcus species NOT DETECTED NOT DETECTED Final   Listeria monocytogenes NOT DETECTED NOT DETECTED Final   Staphylococcus species DETECTED (A) NOT DETECTED Final    Comment: CRITICAL RESULT CALLED TO, READ BACK BY AND VERIFIED WITH: Alvino Chapel.D. 12:35 09/09/17 ([email or web address])    Staphylococcus aureus DETECTED (A) NOT DETECTED Final    Comment: Methicillin (oxacillin) susceptible Staphylococcus aureus (MSSA). Preferred therapy is anti staphylococcal beta lactam antibiotic (Cefazolin or Nafcillin), unless clinically contraindicated. CRITICAL RESULT CALLED TO, READ BACK BY AND VERIFIED WITH: Alvino Chapel.D. 12:35 09/09/17 ([email or web address])    Methicillin resistance NOT DETECTED NOT DETECTED Final   Streptococcus species NOT DETECTED NOT DETECTED Final   Streptococcus agalactiae NOT DETECTED NOT DETECTED Final   Streptococcus pneumoniae NOT DETECTED NOT DETECTED Final   Streptococcus pyogenes NOT DETECTED NOT DETECTED Final   Acinetobacter baumannii NOT DETECTED NOT DETECTED Final   Enterobacteriaceae species NOT DETECTED NOT DETECTED Final   Enterobacter cloacae complex NOT DETECTED NOT DETECTED Final   Escherichia coli NOT DETECTED NOT DETECTED Final   Klebsiella oxytoca NOT DETECTED NOT DETECTED Final   Klebsiella pneumoniae NOT DETECTED NOT DETECTED Final   Proteus species NOT DETECTED NOT DETECTED Final   Serratia marcescens NOT DETECTED NOT DETECTED Final   Haemophilus influenzae NOT DETECTED NOT DETECTED Final   Neisseria  meningitidis NOT DETECTED NOT DETECTED Final   Pseudomonas aeruginosa NOT DETECTED NOT DETECTED Final   Candida albicans NOT DETECTED NOT DETECTED Final   Candida glabrata NOT DETECTED NOT DETECTED Final   Candida krusei NOT DETECTED NOT DETECTED Final   Candida parapsilosis NOT DETECTED NOT DETECTED Final   Candida tropicalis NOT DETECTED NOT DETECTED Final  Culture, blood (Routine X 2) w Reflex to ID Panel     Status: None (Preliminary result)   Collection Time: 09/09/17  7:18 PM  Result Value Ref Range Status   Specimen Description BLOOD LEFT ANTECUBITAL  Final   Special Requests IN PEDIATRIC BOTTLE Blood Culture adequate volume  Final  Culture NO GROWTH 2 DAYS  Final   Report Status PENDING  Incomplete  Culture, blood (Routine X 2) w Reflex to ID Panel     Status: None (Preliminary result)   Collection Time: 09/09/17  7:18 PM  Result Value Ref Range Status   Specimen Description BLOOD BLOOD LEFT FOREARM  Final   Special Requests IN PEDIATRIC BOTTLE Blood Culture adequate volume  Final   Culture NO GROWTH 2 DAYS  Final   Report Status PENDING  Incomplete  Culture, blood (Routine X 2) w Reflex to ID Panel     Status: None (Preliminary result)   Collection Time: 09/10/17  6:50 PM  Result Value Ref Range Status   Specimen Description BLOOD LEFT HAND  Final   Special Requests   Final    BOTTLES DRAWN AEROBIC ONLY Blood Culture adequate volume   Culture NO GROWTH < 24 HOURS  Final   Report Status PENDING  Incomplete  Culture, blood (Routine X 2) w Reflex to ID Panel     Status: None (Preliminary result)   Collection Time: 09/10/17  6:50 PM  Result Value Ref Range Status   Specimen Description BLOOD RIGHT HAND  Final   Special Requests   Final    BOTTLES DRAWN AEROBIC ONLY Blood Culture adequate volume   Culture NO GROWTH < 24 HOURS  Final   Report Status PENDING  Incomplete    Studies/Results: No results found.   Assessment/Plan: MSSA bacteremia MV and Ao  endocarditis CKD4  Total days of antibiotics: 3 ancef  Would send him home with 6 weeks of ancef Needs central line as allowed by his CrCl.  His repeat BCx are negative.  Can f/u in ID clinic if needed, otherwise f/u with PCP.  opat consult Available as needed    Allergies  Allergen Reactions  . Penicillins Rash    Has patient had a PCN reaction causing immediate rash, facial/tongue/throat swelling, SOB or lightheadedness with hypotension: Yes Has patient had a PCN reaction causing severe rash involving mucus membranes or skin necrosis: No Has patient had a PCN reaction that required hospitalization: No Has patient had a PCN reaction occurring within the last 10 years: No If all of the above answers are "NO", then may proceed with Cephalosporin use.    OPAT Orders Discharge antibiotics: Per pharmacy protocol ancef  Duration: 42 days End Date: 10-21-17  [facility] Care Per Protocol:  Labs weekly while on IV antibiotics: x__ CBC with differential __ BMP _x_ CMP __ CRP __ ESR __ Vancomycin trough  _x_ Please pull IV at completion of IV antibiotics   Fax weekly labs to [phone]  Clinic Follow Up Appt: PCP         Bobby Rumpf MD, FACP Infectious Diseases (pager) [phone] [email or web address] 09/12/2017, 12:37 PM  LOS: 3 days

## 2017-09-12 NOTE — Progress Notes (Signed)
Pharmacy Antibiotic Note  John [name] is a 66 y.o. male admitted on 09/06/2017 with MSSA bacteremia and probable early native valve infective endocarditis, mitral and aortic valve. Pharmacy consulted for Cefazolin dosing. Noted PCN allergy listed as rash.  SCr 3.55, CrCl~20-25 ml/min. Normal dosing of 2g IV every 8 hours was adjusted to 1g IV every 12 hours.   Plan: Continue Cefazolin 1g IV every 12 hours Will continue to follow renal function, culture results, LOT, and antibiotic de-escalation plans   Height: 6\' 2"  (188 cm) Weight: 205 lb (93 kg) IBW/kg (Calculated) : 82.2  Temp (24hrs), Avg:97.7 F (36.5 C), Min:97.7 F (36.5 C), Max:97.8 F (36.6 C)   Recent Labs Lab 09/06/17 1245 09/06/17 1847 09/07/17 0629 09/10/17 0505 09/11/17 0410 09/12/17 0923  WBC 11.3*  --  8.5 10.5  --  12.8*  CREATININE 4.02*  --  4.15* 3.76* 4.08* 3.55*  LATICACIDVEN  --  1.02  --   --   --   --     Estimated Creatinine Clearance: 24.8 mL/min (A) (by C-G formula based on SCr of 3.55 mg/dL (H)).    Allergies  Allergen Reactions  . Penicillins Rash    Has patient had a PCN reaction causing immediate rash, facial/tongue/throat swelling, SOB or lightheadedness with hypotension: Yes Has patient had a PCN reaction causing severe rash involving mucus membranes or skin necrosis: No Has patient had a PCN reaction that required hospitalization: No Has patient had a PCN reaction occurring within the last 10 years: No If all of the above answers are "NO", then may proceed with Cephalosporin use.    Antimicrobials this admission: Cefazolin 10/18 >>  Dose adjustments this admission: n/a  Microbiology results: 10/15 BCx >>ngf 10/16 BCx >> 1/2 GPC in clusters (BCID MSSA) 10/18 BCx >>ngtd 10/19 BCx >>ngtd 10/21 BCx >>sent   Thank you for allowing [place] to participate in this patients care.  Jens Som, PharmD Clinical phone for 09/12/2017 from 7a-3:30p: x [phone] If after 3:30p,  please call main pharmacy at: [phone] 09/12/2017 11:39 AM

## 2017-09-12 NOTE — Progress Notes (Signed)
PHARMACY CONSULT NOTE FOR:  OUTPATIENT  PARENTERAL ANTIBIOTIC THERAPY (OPAT)  Indication: Endocarditis Regimen: Cefazolin 1 gram q 12 hours End date: 10/21/17  IV antibiotic discharge orders are pended. To discharging provider:  please sign these orders via discharge navigator,  Select New Orders & click on the button choice - Manage This Unsigned Work.     Thank you for allowing [place] to participate in this patients care.  Jens Som, PharmD Clinical phone for 09/12/2017 from 7a-3:30p: [phone] If after 3:30p, please call main pharmacy at: [phone] 09/12/2017 1:08 PM

## 2017-09-12 NOTE — Progress Notes (Signed)
PROGRESS NOTE  John [name] MHD:[id number] DOB: October 05, 1951 DOA: 09/06/2017 PCP: John Riches, NP  HPI/Recap of past 24 hours: John [name] is a 66 year old male with medical history significant for gout, osteoarthritis, cervical spine and lumbar fusion (remote history) CKD, type 2 diabetes who was admitted for acute back pain with fever of 102.5, tachycardic to low 100s.  Patient presentation was concerning for discitis however MRI of lumbar spine showed no nidus of infection and patient was treated appropriately with IV steroids for possible gouty arthritis of the spine given elevated uric acid and clinical improvement with supportive care.  Hospital course was complicated by growth of MSSA and admission blood culture.  Patient was started on cefazolin and TTE was obtained which showed possible vegetation on aortic valve.  TEE on October 19 concerning for small vegetation of mitral and aortic valve prompting treatment for native valve infective endocarditis.   No acute events overnight.  Patient was worried about IV line as it seems to be moving his arm patient the patient's nurse secured it.  Explained to patient the necessity awaiting for blood cultures to continue negative growth before obtaining long-term IV access  Assessment/Plan: Principal Problem:   Low back pain Active Problems:   Acute on chronic kidney failure (HCC)   DM (diabetes mellitus), type 2 with renal complications (HCC)   Essential hypertension   HLD (hyperlipidemia)   Back pain   MSSA bacteremia   MSSA bacteremia  Probable early native valve infective endocarditis, mitral and aortic valve  Remains hemodynamically stable and repeat blood cultures negative - f/u serial blood cultures if remain negative plan for long term IV access to complete antibiotic therapy -- Likely PICC on Tuesday -Continue cefazolin - ID following, rec 6 week course of iv antibiotics (OPAT)  Acute low back pain and fever, likely  septic sequela of infective endocarditis With MSSA in blood culture,l vegetation on aortic and mitral valve there is high concern for discitis/osteomyelitis.  MRI lumbar spine showed no discitis.  IR see no nidus of infection on MRI lumbar spine so sees no need for disc aspiration -Continue cefazolin, --tapering IV Solu-Medrol, end 10/22 - Infectious disease following, appreciate recs  Type 2 diabetes, poorly controlled, improving IV steroids are being tapered and will help with better control -Hold home oral hypoglycemics --Lantus 15 U, sliding scale insulin and monitor glucose closely --IV steroids taper  Acute on chronic kidney failure,improving Last known creatinine baseline in July 2010 2.74.  On admission creatinine of 4.02 BUN 48.  Patient's creatinine to 3.55 currently. Likely related to poor po/fluid intake related to NPO status on yesterday 10/19 - Followed by Dr. 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     Consultants:  Infectious Disease, Dr. Carlyle [name]   Procedures: 09/09/18 TTE: EF 55-60%. Grade 1 diastolic dysfunction. "- Aortic valve: ECHO dense small mass on aortic valve that could be   calcium  or small vegetation. It is nonmobile and is upstream on the valve. Suggest TEE to further  evaluate.  09/10/18 TEE : EF 55-60%.  "Aortic valve: There was a 1.0 cm (L) x 0.2 cm (W), strand-like  echodensity intermittently seen in the LVOT. It is unclear if it  is attached to the aortic valve. Mitral Valve Very small 0.6 cm (L) x 0.2 cm (W)  filamentous hypodensity intermittently seen on the atrial side of  the anterior leaflet.  Impression: Very small, strand-like echodensities were noted on the mitral and aortic valves. Cannot rule out early endocarditis. No clear   vegetations were noted  Antimicrobials:  Cefazolin 10/18-Current  DVT prophylaxis:  Heparin   Objective: Vitals:   09/11/17 2120 09/12/17 0555 09/12/17 0830 09/12/17 1358  BP: (!) 146/83 (!) 157/90  (!) 159/83  Pulse: 69 66  73  Resp: 16 16  16   Temp: 97.8 F (36.6 C) 97.7 F (36.5 C)  98 F (36.7 C)  TempSrc: Oral Oral  Oral  SpO2: 98% 97% 96% 100%  Weight:      Height:        Intake/Output Summary (Last 24 hours) at 09/12/17 1755 Last data filed at 09/12/17 4166  Gross per 24 hour  Intake           861.66 ml  Output                0 ml  Net           861.66 ml   Filed Weights   09/07/17 1628 09/10/17 1333  Weight: 93.5 kg (206 lb 1.6 oz) 93 kg (205 lb)    Exam:   General: lying in bed comfortably, in no apparent distress, pleasant in conversation  Skin: Dry, intact, IV line in place in right antecubital fossa with no surrounding erythema or tenderness  Psychiatry: Normal affect and mood   Data Reviewed: CBC:  Recent Labs Lab 09/06/17 1245 09/07/17 0629 09/10/17 0505 09/12/17 0923  WBC 11.3* 8.5 10.5 12.8*  NEUTROABS 8.8*  --   --   --   HGB 13.2 12.0* 12.4* 13.0  HCT 38.7* 36.6* 36.1* 38.5*  MCV 89.4 89.3 87.2 88.5  PLT 179 162 266 063   Basic Metabolic Panel:  Recent Labs Lab 09/06/17 1245 09/07/17 0629 09/10/17 0505 09/11/17 0410 09/12/17 0923  NA 131* 133* 129* 131* 136  K 4.2 4.2 5.0 4.4 4.6  CL 98* 101 94* 95* 106  CO2 21* 21* 21* 21* 17*  GLUCOSE 193* 212*  323* 305* 201*  BUN 48* 54* 78* 92* 84*  CREATININE 4.02* 4.15* 3.76* 4.08* 3.55*  CALCIUM 9.2 9.0 9.2 9.4 8.9   GFR: Estimated Creatinine Clearance: 24.8 mL/min (A) (by C-G formula based on SCr of 3.55 mg/dL (H)). Liver Function Tests:  Recent Labs Lab 09/06/17 1245  AST 15  ALT 20  ALKPHOS 80  BILITOT 0.6  PROT 7.3  ALBUMIN 3.5   No results for input(s): LIPASE, AMYLASE in the last 168 hours. No results for input(s): AMMONIA in the last 168 hours. Coagulation Profile:  Recent Labs Lab 09/06/17 1245  INR 1.02   Cardiac Enzymes:  Recent Labs Lab 09/07/17 0629  CKTOTAL 51   BNP (last 3 results) No results for input(s): PROBNP in the last 8760 hours. HbA1C: No results for input(s): HGBA1C in the last 72 hours. CBG:  Recent Labs Lab 09/11/17 1700 09/11/17 2118 09/12/17 0747 09/12/17 1132 09/12/17 1655  GLUCAP 240* 242* 145*  297* 224*   Lipid Profile: No results for input(s): CHOL, HDL, LDLCALC, TRIG, CHOLHDL, LDLDIRECT in the last 72 hours. Thyroid Function Tests: No results for input(s): TSH, T4TOTAL, FREET4, T3FREE, THYROIDAB in the last 72 hours. Anemia Panel: No results for input(s): VITAMINB12, FOLATE, FERRITIN, TIBC, IRON, RETICCTPCT in the last 72 hours. Urine analysis:    Component Value Date/Time   COLORURINE STRAW (A) 09/06/2017 [place] 09/06/2017 1246   LABSPEC 1.010 09/06/2017 1246   PHURINE 6.0 09/06/2017 1246   GLUCOSEU >=500 (A) 09/06/2017 1246   HGBUR NEGATIVE 09/06/2017 [place] 09/06/2017 [place] 09/06/2017 1246   PROTEINUR 100 (A) 09/06/2017 1246   NITRITE NEGATIVE 09/06/2017 1246   LEUKOCYTESUR NEGATIVE 09/06/2017 1246   Sepsis Labs: @LABRCNTIP (procalcitonin:4,lacticidven:4)  ) Recent Results (from the past 240 hour(s))  Culture, blood (routine x 2)     Status: None   Collection Time: 09/06/17  6:50 PM  Result Value Ref Range Status   Specimen Description BLOOD RIGHT  ANTECUBITAL  Final   Special Requests   Final    BOTTLES DRAWN AEROBIC AND ANAEROBIC Blood Culture results may not be optimal due to an excessive volume of blood received in culture bottles   Culture NO GROWTH 5 DAYS  Final   Report Status 09/11/2017 FINAL  Final  Culture, blood (routine x 2)     Status: None   Collection Time: 09/07/17  2:50 PM  Result Value Ref Range Status   Specimen Description BLOOD RIGHT HAND  Final   Special Requests   Final    BOTTLES DRAWN AEROBIC AND ANAEROBIC Blood Culture adequate volume   Culture NO GROWTH 5 DAYS  Final   Report Status 09/12/2017 FINAL  Final  Culture, blood (routine x 2)     Status: Abnormal   Collection Time: 09/07/17  2:59 PM  Result Value Ref Range Status   Specimen Description BLOOD LEFT ANTECUBITAL  Final   Special Requests   Final    BOTTLES DRAWN AEROBIC AND ANAEROBIC Blood Culture results may not be optimal due to an excessive volume of blood received in culture bottles   Culture  Setup Time   Final    GRAM POSITIVE COCCI IN CLUSTERS AEROBIC BOTTLE ONLY CRITICAL RESULT CALLED TO, READ BACK BY AND VERIFIED WITH: Alvino Chapel.D. 12:35 09/09/17 ([email or web address])    Culture STAPHYLOCOCCUS AUREUS (A)  Final   Report Status 09/11/2017 FINAL  Final   Organism ID, Bacteria STAPHYLOCOCCUS AUREUS  Final      Susceptibility   Staphylococcus aureus - MIC*    CIPROFLOXACIN <=0.5 SENSITIVE Sensitive     ERYTHROMYCIN <=0.25 SENSITIVE Sensitive     GENTAMICIN <=0.5 SENSITIVE Sensitive     OXACILLIN 0.5 SENSITIVE Sensitive     TETRACYCLINE <=1 SENSITIVE Sensitive     VANCOMYCIN <=0.5 SENSITIVE Sensitive     TRIMETH/SULFA <=10 SENSITIVE Sensitive     CLINDAMYCIN <=0.25 SENSITIVE Sensitive     RIFAMPIN <=0.5 SENSITIVE Sensitive     Inducible Clindamycin NEGATIVE Sensitive     * STAPHYLOCOCCUS AUREUS  Blood Culture ID Panel (Reflexed)     Status: Abnormal   Collection Time: 09/07/17  2:59 PM  Result Value Ref Range Status   Enterococcus  species NOT DETECTED NOT DETECTED Final   Listeria monocytogenes NOT DETECTED NOT DETECTED Final   Staphylococcus species DETECTED (A) NOT DETECTED Final    Comment: CRITICAL RESULT CALLED TO, READ BACK BY AND VERIFIED WITH: E.  Daisy Lazar.D. 12:35 09/09/17 ([email or web address])    Staphylococcus aureus DETECTED (A) NOT DETECTED Final    Comment: Methicillin (oxacillin) susceptible Staphylococcus aureus (MSSA). Preferred therapy is anti staphylococcal beta lactam antibiotic (Cefazolin or Nafcillin), unless clinically contraindicated. CRITICAL RESULT CALLED TO, READ BACK BY AND VERIFIED WITH: Alvino Chapel.D. 12:35 09/09/17 ([email or web address])    Methicillin resistance NOT DETECTED NOT DETECTED Final   Streptococcus species NOT DETECTED NOT DETECTED Final   Streptococcus agalactiae NOT DETECTED NOT DETECTED Final   Streptococcus pneumoniae NOT DETECTED NOT DETECTED Final   Streptococcus pyogenes NOT DETECTED NOT DETECTED Final   Acinetobacter baumannii NOT DETECTED NOT DETECTED Final   Enterobacteriaceae species NOT DETECTED NOT DETECTED Final   Enterobacter cloacae complex NOT DETECTED NOT DETECTED Final   Escherichia coli NOT DETECTED NOT DETECTED Final   Klebsiella oxytoca NOT DETECTED NOT DETECTED Final   Klebsiella pneumoniae NOT DETECTED NOT DETECTED Final   Proteus species NOT DETECTED NOT DETECTED Final   Serratia marcescens NOT DETECTED NOT DETECTED Final   Haemophilus influenzae NOT DETECTED NOT DETECTED Final   Neisseria meningitidis NOT DETECTED NOT DETECTED Final   Pseudomonas aeruginosa NOT DETECTED NOT DETECTED Final   Candida albicans NOT DETECTED NOT DETECTED Final   Candida glabrata NOT DETECTED NOT DETECTED Final   Candida krusei NOT DETECTED NOT DETECTED Final   Candida parapsilosis NOT DETECTED NOT DETECTED Final   Candida tropicalis NOT DETECTED NOT DETECTED Final  Culture, blood (Routine X 2) w Reflex to ID Panel     Status: None (Preliminary result)   Collection Time: 09/09/17   7:18 PM  Result Value Ref Range Status   Specimen Description BLOOD LEFT ANTECUBITAL  Final   Special Requests IN PEDIATRIC BOTTLE Blood Culture adequate volume  Final   Culture NO GROWTH 3 DAYS  Final   Report Status PENDING  Incomplete  Culture, blood (Routine X 2) w Reflex to ID Panel     Status: None (Preliminary result)   Collection Time: 09/09/17  7:18 PM  Result Value Ref Range Status   Specimen Description BLOOD BLOOD LEFT FOREARM  Final   Special Requests IN PEDIATRIC BOTTLE Blood Culture adequate volume  Final   Culture NO GROWTH 3 DAYS  Final   Report Status PENDING  Incomplete  Culture, blood (Routine X 2) w Reflex to ID Panel     Status: None (Preliminary result)   Collection Time: 09/10/17  6:50 PM  Result Value Ref Range Status   Specimen Description BLOOD LEFT HAND  Final   Special Requests   Final    BOTTLES DRAWN AEROBIC ONLY Blood Culture adequate volume   Culture NO GROWTH 2 DAYS  Final   Report Status PENDING  Incomplete  Culture, blood (Routine X 2) w Reflex to ID Panel     Status: None (Preliminary result)   Collection Time: 09/10/17  6:50 PM  Result Value Ref Range Status   Specimen Description BLOOD RIGHT HAND  Final   Special Requests   Final    BOTTLES DRAWN AEROBIC ONLY Blood Culture adequate volume   Culture NO GROWTH 2 DAYS  Final   Report Status PENDING  Incomplete      Studies: No results found.  Scheduled Meds: . amLODipine  5 mg Oral Daily  . aspirin EC  81 mg Oral Daily  . calcitRIOL  0.5 mcg Oral Daily  . gabapentin  300 mg Oral TID  . heparin subcutaneous  5,000 Units Subcutaneous Q8H  . insulin aspart  0-20 Units Subcutaneous TID WC  . insulin glargine  15 Units Subcutaneous Daily  . ketotifen  1 drop Left Eye BID  . methylPREDNISolone (SOLU-MEDROL) injection  20 mg Intravenous Q12H  . metoprolol succinate  50 mg Oral Daily  . polyethylene glycol  17 g Oral Daily  . senna-docusate  2 tablet Oral BID  . simvastatin  40 mg Oral Daily   . tamsulosin  0.4 mg Oral QAC supper    Continuous Infusions: . sodium chloride 100 mL/hr at 09/12/17 1646  .  ceFAZolin (ANCEF) IV Stopped (09/12/17 1022)     LOS: 3 days     Desiree Hane, MD [facility] Pager [phone]  If 7PM-7AM, please contact night-coverage [email or web address] Password [email or web address] 09/12/2017, 5:55 PM

## 2017-09-13 ENCOUNTER — Inpatient Hospital Stay (HOSPITAL_COMMUNITY): Payer: 59

## 2017-09-13 ENCOUNTER — Encounter (HOSPITAL_COMMUNITY): Payer: Self-pay | Admitting: Interventional Radiology

## 2017-09-13 DIAGNOSIS — I358 Other nonrheumatic aortic valve disorders: Secondary | ICD-10-CM

## 2017-09-13 HISTORY — PX: IR US GUIDE VASC ACCESS RIGHT: IMG2390

## 2017-09-13 HISTORY — PX: IR FLUORO GUIDE CV LINE RIGHT: IMG2283

## 2017-09-13 LAB — BASIC METABOLIC PANEL
ANION GAP: 9 (ref 5–15)
BUN: 73 mg/dL — ABNORMAL HIGH (ref 6–20)
CHLORIDE: 109 mmol/L (ref 101–111)
CO2: 20 mmol/L — AB (ref 22–32)
CREATININE: 3.28 mg/dL — AB (ref 0.61–1.24)
Calcium: 8.9 mg/dL (ref 8.9–10.3)
GFR calc non Af Amer: 19 mL/min — ABNORMAL LOW (ref >60)
GFR, EST AFRICAN AMERICAN: 22 mL/min — AB (ref >60)
Glucose, Bld: 199 mg/dL — ABNORMAL HIGH (ref 65–99)
Potassium: 5.3 mmol/L — ABNORMAL HIGH (ref 3.5–5.1)
Sodium: 138 mmol/L (ref 135–145)

## 2017-09-13 LAB — CBC WITH DIFFERENTIAL/PLATELET
Basophils Absolute: 0 10*3/uL (ref 0.0–0.1)
Basophils Relative: 0 %
Eosinophils Absolute: 0 10*3/uL (ref 0.0–0.7)
Eosinophils Relative: 0 %
HEMATOCRIT: 37.1 % — AB (ref 39.0–52.0)
HEMOGLOBIN: 12.3 g/dL — AB (ref 13.0–17.0)
LYMPHS ABS: 0.9 10*3/uL (ref 0.7–4.0)
Lymphocytes Relative: 8 %
MCH: 29.6 pg (ref 26.0–34.0)
MCHC: 33.2 g/dL (ref 30.0–36.0)
MCV: 89.4 fL (ref 78.0–100.0)
MONOS PCT: 9 %
Monocytes Absolute: 1.1 10*3/uL — ABNORMAL HIGH (ref 0.1–1.0)
NEUTROS ABS: 10 10*3/uL — AB (ref 1.7–7.7)
NEUTROS PCT: 83 %
Platelets: 289 10*3/uL (ref 150–400)
RBC: 4.15 MIL/uL — ABNORMAL LOW (ref 4.22–5.81)
RDW: 13.3 % (ref 11.5–15.5)
WBC: 12 10*3/uL — ABNORMAL HIGH (ref 4.0–10.5)

## 2017-09-13 LAB — GLUCOSE, CAPILLARY
Glucose-Capillary: 184 mg/dL — ABNORMAL HIGH (ref 65–99)
Glucose-Capillary: 244 mg/dL — ABNORMAL HIGH (ref 65–99)
Glucose-Capillary: 184 mg/dL — ABNORMAL HIGH (ref 65–99)
Glucose-Capillary: 238 mg/dL — ABNORMAL HIGH (ref 65–99)

## 2017-09-13 MED ORDER — GABAPENTIN 100 MG PO CAPS
200.0000 mg | ORAL_CAPSULE | Freq: Three times a day (TID) | ORAL | Status: DC
  Administered 2017-09-13 – 2017-09-14 (×3): 200 mg via ORAL
  Filled 2017-09-13 (×3): qty 2

## 2017-09-13 MED ORDER — LIDOCAINE HCL 1 % IJ SOLN
INTRAMUSCULAR | Status: DC | PRN
  Administered 2017-09-13: 10 mL

## 2017-09-13 MED ORDER — LIDOCAINE HCL 1 % IJ SOLN
INTRAMUSCULAR | Status: AC
  Filled 2017-09-13: qty 20

## 2017-09-13 MED ORDER — INSULIN ASPART 100 UNIT/ML ~~LOC~~ SOLN
4.0000 [IU] | Freq: Three times a day (TID) | SUBCUTANEOUS | Status: DC
  Administered 2017-09-13 – 2017-09-14 (×3): 4 [IU] via SUBCUTANEOUS

## 2017-09-13 NOTE — Procedures (Signed)
Interventional Radiology Procedure Note  Procedure: Right IJ tunneled CVC placement  Complications: None  Estimated Blood Loss: < 10 mL  6 Fr DL Power line cut to 24 cm placed via right IJ vein.  Tip at SVC/RA junction.  OK to use.  John [name]. Kathlene Cote, M.D Pager:  [phone]

## 2017-09-13 NOTE — Progress Notes (Signed)
PROGRESS NOTE  John [name] NIO:[id number] DOB: July 05, 1951 DOA: 09/06/2017 PCP: Imagene Riches, NP  HPI/Recap of past 24 hours: John [name] is a 66 year old male with medical history significant for gout, osteoarthritis, cervical spine and lumbar fusion (remote history) CKD, type 2 diabetes who was admitted for acute back pain with fever of 102.5, tachycardic to low 100s.  Patient presentation was concerning for discitis however MRI of lumbar spine showed no nidus of infection and patient was treated appropriately with IV steroids for possible gouty arthritis of the spine given elevated uric acid and clinical improvement with supportive care.  Hospital course was complicated by growth of MSSA and admission blood culture.  Patient was started on cefazolin and TTE was obtained which showed possible vegetation on aortic valve.  TEE on October 19 concerning for small vegetation of mitral and aortic valve prompting treatment for native valve infective endocarditis.   No acute events overnight.  Patient continues to deny any chest pain, nausea, vomiting, fevers or chills. Does continue to endorse lower back pain.  Assessment/Plan: Principal Problem:   Low back pain Active Problems:   Acute on chronic kidney failure (HCC)   DM (diabetes mellitus), type 2 with renal complications (HCC)   Essential hypertension   HLD (hyperlipidemia)   Back pain   MSSA bacteremia   MSSA bacteremia  Early native valve infective endocarditis, mitral and aortic valve Repeat blood cultures have remained negative Given patient's kidney disease stage will opt for tunneled central line placement for long-term IV use to preserve peripheral veins -Continue cefazolin -Obtain right IJ tunneled central line-on Tuesday -Continue cefazolin -ID following, recs 6 week course of iv antibiotics (OPAT)  Acute low back pain and fever, likely septic sequela of infective endocarditis With MSSA in blood culture,l  vegetation on aortic and mitral valve there is high concern for discitis/osteomyelitis however, MRI lumbar spine showed no discitis.  IR see no nidus of infection on MRI lumbar spine so sees no need for disc aspiration -Continue cefazolin, - Infectious disease following, appreciate recs  Type 2 diabetes, poorly controlled, improving IV steroids are being tapered and will help with better control -Hold home oral hypoglycemics --Lantus 15 U, scheduled novolog with mealtime ( hold parameters if patient eats less than  50% of food),sliding scale insulin and monitor glucose closely   Acute on chronic kidney failure,improving Last known creatinine baseline in July 2010 2.74.  On admission creatinine of 4.02 BUN 48.  Patient's creatinine to 3.28 currently. Likely related to poor po/fluid intake related to NPO status on 10/19 - Followed by Dr. Adriana Mccallum nephrologist - encourage increased hydration -Monitor intake and output, avoid nephrotoxins (no NSAID use for possible gouty arthritis) - avoiding PICC to preserve peripheral veins  Constipation - bowel regimen (miralax, increase senna-docusate to BID)  Hypertension, controlled - metoprolol, amlodipine  CAD s/p DES Hypertension - metoprolol, aspirin, statin  Intrabadominal Aneurysm Measures 4.2 cm -Recommend follow up as outpatient for repeat imaging in a year  Code Status: Full Code   Family Communication: Updated wife at bedside   Disposition Plan: d/c on 10/23 after RIJ central line placed.     Consultants:  Infectious Disease, Dr. Carlyle Basques   Procedures: 09/09/18 TTE: EF 55-60%. Grade 1 diastolic dysfunction. "- Aortic valve: ECHO dense small mass on aortic valve that could be   calcium or small vegetation. It is nonmobile and is upstream on the valve. Suggest TEE to further evaluate.  09/10/18 TEE : EF 55-60%.  "  Aortic valve: There was a 1.0 cm (L) x 0.2 cm (W), strand-like  echodensity intermittently seen in the  LVOT. It is unclear if it  is attached to the aortic valve. Mitral Valve Very small 0.6 cm (L) x 0.2 cm (W)  filamentous hypodensity intermittently seen on the atrial side of  the anterior leaflet.  Impression: Very small, strand-like echodensities were noted on the mitral and aortic valves. Cannot rule out early endocarditis. No clear   vegetations were noted  Antimicrobials:  Cefazolin 10/18-Current  DVT prophylaxis:  Heparin   Objective: Vitals:   09/12/17 2147 09/12/17 2154 09/13/17 0519 09/13/17 0927  BP: (!) 144/80 (!) 172/92 (!) 168/87 140/77  Pulse: 75 100 71   Resp: 20 18 18    Temp: 98.5 F (36.9 C) 99 F (37.2 C) 98.9 F (37.2 C)   TempSrc: Oral Oral Oral   SpO2: 97% 98% 96%   Weight:      Height:        Intake/Output Summary (Last 24 hours) at 09/13/17 1824 Last data filed at 09/13/17 1245  Gross per 24 hour  Intake              590 ml  Output                0 ml  Net              590 ml   Filed Weights   09/07/17 1628 09/10/17 1333  Weight: 93.5 kg (206 lb 1.6 oz) 93 kg (205 lb)    Exam:   General: lying in bed comfortably, in no apparent distress, pleasant in conversation  Skin: Dry, intact, IV line in place in right antecubital fossa with no surrounding erythema or tenderness  Psychiatry: Normal affect and mood   Data Reviewed: CBC:  Recent Labs Lab 09/07/17 0629 09/10/17 0505 09/12/17 0923 09/13/17 0744  WBC 8.5 10.5 12.8* 12.0*  NEUTROABS  --   --   --  10.0*  HGB 12.0* 12.4* 13.0 12.3*  HCT 36.6* 36.1* 38.5* 37.1*  MCV 89.3 87.2 88.5 89.4  PLT 162 266 280 809   Basic Metabolic Panel:  Recent Labs Lab 09/07/17 0629 09/10/17 0505 09/11/17 0410 09/12/17 0923 09/13/17 0744  NA 133* 129* 131* 136 138  K 4.2 5.0 4.4 4.6 5.3*  CL 101 94* 95* 106 109  CO2 21* 21* 21* 17* 20*  GLUCOSE 212* 323* 305* 201* 199*  BUN 54* 78* 92* 84* 73*  CREATININE 4.15* 3.76* 4.08* 3.55* 3.28*  CALCIUM 9.0 9.2 9.4 8.9 8.9   GFR: Estimated  Creatinine Clearance: 26.8 mL/min (A) (by C-G formula based on SCr of 3.28 mg/dL (H)). Liver Function Tests: No results for input(s): AST, ALT, ALKPHOS, BILITOT, PROT, ALBUMIN in the last 168 hours. No results for input(s): LIPASE, AMYLASE in the last 168 hours. No results for input(s): AMMONIA in the last 168 hours. Coagulation Profile: No results for input(s): INR, PROTIME in the last 168 hours. Cardiac Enzymes:  Recent Labs Lab 09/07/17 0629  CKTOTAL 51   BNP (last 3 results) No results for input(s): PROBNP in the last 8760 hours. HbA1C: No results for input(s): HGBA1C in the last 72 hours. CBG:  Recent Labs Lab 09/12/17 1655 09/12/17 2146 09/13/17 0802 09/13/17 1205 09/13/17 1703  GLUCAP 224* 250* 184* 244* 184*   Lipid Profile: No results for input(s): CHOL, HDL, LDLCALC, TRIG, CHOLHDL, LDLDIRECT in the last 72 hours. Thyroid Function Tests: No results for input(s): TSH,  T4TOTAL, FREET4, T3FREE, THYROIDAB in the last 72 hours. Anemia Panel: No results for input(s): VITAMINB12, FOLATE, FERRITIN, TIBC, IRON, RETICCTPCT in the last 72 hours. Urine analysis:    Component Value Date/Time   COLORURINE STRAW (A) 09/06/2017 [place] 09/06/2017 1246   LABSPEC 1.010 09/06/2017 1246   PHURINE 6.0 09/06/2017 1246   GLUCOSEU >=500 (A) 09/06/2017 1246   HGBUR NEGATIVE 09/06/2017 [place] 09/06/2017 [place] 09/06/2017 1246   PROTEINUR 100 (A) 09/06/2017 1246   NITRITE NEGATIVE 09/06/2017 1246   LEUKOCYTESUR NEGATIVE 09/06/2017 1246   Sepsis Labs: @LABRCNTIP (procalcitonin:4,lacticidven:4)  ) Recent Results (from the past 240 hour(s))  Culture, blood (routine x 2)     Status: None   Collection Time: 09/06/17  6:50 PM  Result Value Ref Range Status   Specimen Description BLOOD RIGHT ANTECUBITAL  Final   Special Requests   Final    BOTTLES DRAWN AEROBIC AND ANAEROBIC Blood Culture results may not be optimal due to an  excessive volume of blood received in culture bottles   Culture NO GROWTH 5 DAYS  Final   Report Status 09/11/2017 FINAL  Final  Culture, blood (routine x 2)     Status: None   Collection Time: 09/07/17  2:50 PM  Result Value Ref Range Status   Specimen Description BLOOD RIGHT HAND  Final   Special Requests   Final    BOTTLES DRAWN AEROBIC AND ANAEROBIC Blood Culture adequate volume   Culture NO GROWTH 5 DAYS  Final   Report Status 09/12/2017 FINAL  Final  Culture, blood (routine x 2)     Status: Abnormal   Collection Time: 09/07/17  2:59 PM  Result Value Ref Range Status   Specimen Description BLOOD LEFT ANTECUBITAL  Final   Special Requests   Final    BOTTLES DRAWN AEROBIC AND ANAEROBIC Blood Culture results may not be optimal due to an excessive volume of blood received in culture bottles   Culture  Setup Time   Final    GRAM POSITIVE COCCI IN CLUSTERS AEROBIC BOTTLE ONLY CRITICAL RESULT CALLED TO, READ BACK BY AND VERIFIED WITH: Alvino Chapel.D. 12:35 09/09/17 ([email or web address])    Culture STAPHYLOCOCCUS AUREUS (A)  Final   Report Status 09/11/2017 FINAL  Final   Organism ID, Bacteria STAPHYLOCOCCUS AUREUS  Final      Susceptibility   Staphylococcus aureus - MIC*    CIPROFLOXACIN <=0.5 SENSITIVE Sensitive     ERYTHROMYCIN <=0.25 SENSITIVE Sensitive     GENTAMICIN <=0.5 SENSITIVE Sensitive     OXACILLIN 0.5 SENSITIVE Sensitive     TETRACYCLINE <=1 SENSITIVE Sensitive     VANCOMYCIN <=0.5 SENSITIVE Sensitive     TRIMETH/SULFA <=10 SENSITIVE Sensitive     CLINDAMYCIN <=0.25 SENSITIVE Sensitive     RIFAMPIN <=0.5 SENSITIVE Sensitive     Inducible Clindamycin NEGATIVE Sensitive     * STAPHYLOCOCCUS AUREUS  Blood Culture ID Panel (Reflexed)     Status: Abnormal   Collection Time: 09/07/17  2:59 PM  Result Value Ref Range Status   Enterococcus species NOT DETECTED NOT DETECTED Final   Listeria monocytogenes NOT DETECTED NOT DETECTED Final   Staphylococcus species DETECTED (A) NOT  DETECTED Final    Comment: CRITICAL RESULT CALLED TO, READ BACK BY AND VERIFIED WITH: Alvino Chapel.D. 12:35 09/09/17 ([email or web address])    Staphylococcus aureus DETECTED (A) NOT DETECTED Final    Comment: Methicillin (oxacillin) susceptible Staphylococcus aureus (MSSA). Preferred therapy is anti  staphylococcal beta lactam antibiotic (Cefazolin or Nafcillin), unless clinically contraindicated. CRITICAL RESULT CALLED TO, READ BACK BY AND VERIFIED WITH: Alvino Chapel.D. 12:35 09/09/17 ([email or web address])    Methicillin resistance NOT DETECTED NOT DETECTED Final   Streptococcus species NOT DETECTED NOT DETECTED Final   Streptococcus agalactiae NOT DETECTED NOT DETECTED Final   Streptococcus pneumoniae NOT DETECTED NOT DETECTED Final   Streptococcus pyogenes NOT DETECTED NOT DETECTED Final   Acinetobacter baumannii NOT DETECTED NOT DETECTED Final   Enterobacteriaceae species NOT DETECTED NOT DETECTED Final   Enterobacter cloacae complex NOT DETECTED NOT DETECTED Final   Escherichia coli NOT DETECTED NOT DETECTED Final   Klebsiella oxytoca NOT DETECTED NOT DETECTED Final   Klebsiella pneumoniae NOT DETECTED NOT DETECTED Final   Proteus species NOT DETECTED NOT DETECTED Final   Serratia marcescens NOT DETECTED NOT DETECTED Final   Haemophilus influenzae NOT DETECTED NOT DETECTED Final   Neisseria meningitidis NOT DETECTED NOT DETECTED Final   Pseudomonas aeruginosa NOT DETECTED NOT DETECTED Final   Candida albicans NOT DETECTED NOT DETECTED Final   Candida glabrata NOT DETECTED NOT DETECTED Final   Candida krusei NOT DETECTED NOT DETECTED Final   Candida parapsilosis NOT DETECTED NOT DETECTED Final   Candida tropicalis NOT DETECTED NOT DETECTED Final  Culture, blood (Routine X 2) w Reflex to ID Panel     Status: None (Preliminary result)   Collection Time: 09/09/17  7:18 PM  Result Value Ref Range Status   Specimen Description BLOOD LEFT ANTECUBITAL  Final   Special Requests IN PEDIATRIC BOTTLE Blood  Culture adequate volume  Final   Culture NO GROWTH 4 DAYS  Final   Report Status PENDING  Incomplete  Culture, blood (Routine X 2) w Reflex to ID Panel     Status: None (Preliminary result)   Collection Time: 09/09/17  7:18 PM  Result Value Ref Range Status   Specimen Description BLOOD BLOOD LEFT FOREARM  Final   Special Requests IN PEDIATRIC BOTTLE Blood Culture adequate volume  Final   Culture NO GROWTH 4 DAYS  Final   Report Status PENDING  Incomplete  Culture, blood (Routine X 2) w Reflex to ID Panel     Status: None (Preliminary result)   Collection Time: 09/10/17  6:50 PM  Result Value Ref Range Status   Specimen Description BLOOD LEFT HAND  Final   Special Requests   Final    BOTTLES DRAWN AEROBIC ONLY Blood Culture adequate volume   Culture NO GROWTH 3 DAYS  Final   Report Status PENDING  Incomplete  Culture, blood (Routine X 2) w Reflex to ID Panel     Status: None (Preliminary result)   Collection Time: 09/10/17  6:50 PM  Result Value Ref Range Status   Specimen Description BLOOD RIGHT HAND  Final   Special Requests   Final    BOTTLES DRAWN AEROBIC ONLY Blood Culture adequate volume   Culture NO GROWTH 3 DAYS  Final   Report Status PENDING  Incomplete  Culture, blood (Routine X 2) w Reflex to ID Panel     Status: None (Preliminary result)   Collection Time: 09/12/17  9:23 AM  Result Value Ref Range Status   Specimen Description BLOOD LEFT WRIST  Final   Special Requests IN PEDIATRIC BOTTLE Blood Culture adequate volume  Final   Culture NO GROWTH < 24 HOURS  Final   Report Status PENDING  Incomplete  Culture, blood (Routine X 2) w Reflex to ID Panel  Status: None (Preliminary result)   Collection Time: 09/12/17  9:30 AM  Result Value Ref Range Status   Specimen Description BLOOD LEFT WRIST  Final   Special Requests IN PEDIATRIC BOTTLE Blood Culture adequate volume  Final   Culture NO GROWTH < 24 HOURS  Final   Report Status PENDING  Incomplete      Studies: Ir  Fluoro Guide Cv Line Right  Result Date: 09/13/2017 CLINICAL DATA:  MSSA bacteremia and echocardiographic evidence of possible early endocarditis of mitral and aortic valves. Request has been made to place a tunneled central venous catheter for long-term IV antibiotics. Upper extremity PICC line cannot be placed due to history of significant chronic kidney disease. EXAM: TUNNELED CENTRAL VENOUS CATHETER PLACEMENT WITH ULTRASOUND AND FLUOROSCOPIC GUIDANCE ANESTHESIA/SEDATION: None MEDICATIONS: None FLUOROSCOPY TIME:  12 seconds.  8.9 mGy. PROCEDURE: The procedure, risks, benefits, and alternatives were explained to the patient. Questions regarding the procedure were encouraged and answered. The patient understands and consents to the procedure. A timeout was performed prior to initiating the procedure. Ultrasound was used to confirm patency of the right internal jugular vein. The right neck and chest were prepped with chlorhexidine in a sterile fashion, and a sterile drape was applied covering the operative field. Maximum barrier sterile technique with sterile gowns and gloves were used for the procedure. Local anesthesia was provided with 1% lidocaine. After creating a small venotomy incision, a 21 gauge needle was advanced into the right internal jugular vein under direct, real-time ultrasound guidance. Ultrasound image documentation was performed. After securing guidewire access, a 6 Fr peel-away sheath was placed. A wire was kinked to measure appropriate catheter length. A 6 [place] dual-lumen Power Line was chosen for placement. The catheter was cut to 24 cm then tunneled in a retrograde fashion from the chest wall to the venotomy incision. The catheter was then placed through the sheath and the sheath removed. Final catheter positioning was confirmed and documented with a fluoroscopic spot image. The catheter was aspirated and flushed with saline. The venotomy incision was closed with subcutaneous  subcuticular 4-0 Vicryl. Dermabond was applied to the incision. The catheter exit site was secured with Prolene retention sutures. COMPLICATIONS: None.  No pneumothorax. FINDINGS: After catheter placement, the tip lies at the SVC/ RA junction. The catheter aspirates normally and is ready for immediate use. IMPRESSION: Placement of tunneled central venous catheter via the right internal jugular vein. The catheter tip lies at the SVC/ RA junction. The catheter is ready for immediate use. Electronically Signed   By: Aletta Edouard M.D.   On: 09/13/2017 16:17   Ir US Guide Vasc Access Right  Result Date: 09/13/2017 CLINICAL DATA:  MSSA bacteremia and echocardiographic evidence of possible early endocarditis of mitral and aortic valves. Request has been made to place a tunneled central venous catheter for long-term IV antibiotics. Upper extremity PICC line cannot be placed due to history of significant chronic kidney disease. EXAM: TUNNELED CENTRAL VENOUS CATHETER PLACEMENT WITH ULTRASOUND AND FLUOROSCOPIC GUIDANCE ANESTHESIA/SEDATION: None MEDICATIONS: None FLUOROSCOPY TIME:  12 seconds.  8.9 mGy. PROCEDURE: The procedure, risks, benefits, and alternatives were explained to the patient. Questions regarding the procedure were encouraged and answered. The patient understands and consents to the procedure. A timeout was performed prior to initiating the procedure. Ultrasound was used to confirm patency of the right internal jugular vein. The right neck and chest were prepped with chlorhexidine in a sterile fashion, and a sterile drape was applied covering the operative field. Maximum  barrier sterile technique with sterile gowns and gloves were used for the procedure. Local anesthesia was provided with 1% lidocaine. After creating a small venotomy incision, a 21 gauge needle was advanced into the right internal jugular vein under direct, real-time ultrasound guidance. Ultrasound image documentation was performed.  After securing guidewire access, a 6 Fr peel-away sheath was placed. A wire was kinked to measure appropriate catheter length. A 6 [place] dual-lumen Power Line was chosen for placement. The catheter was cut to 24 cm then tunneled in a retrograde fashion from the chest wall to the venotomy incision. The catheter was then placed through the sheath and the sheath removed. Final catheter positioning was confirmed and documented with a fluoroscopic spot image. The catheter was aspirated and flushed with saline. The venotomy incision was closed with subcutaneous subcuticular 4-0 Vicryl. Dermabond was applied to the incision. The catheter exit site was secured with Prolene retention sutures. COMPLICATIONS: None.  No pneumothorax. FINDINGS: After catheter placement, the tip lies at the SVC/ RA junction. The catheter aspirates normally and is ready for immediate use. IMPRESSION: Placement of tunneled central venous catheter via the right internal jugular vein. The catheter tip lies at the SVC/ RA junction. The catheter is ready for immediate use. Electronically Signed   By: Aletta Edouard M.D.   On: 09/13/2017 16:17    Scheduled Meds: . amLODipine  5 mg Oral Daily  . aspirin EC  81 mg Oral Daily  . calcitRIOL  0.5 mcg Oral Daily  . gabapentin  200 mg Oral TID  . heparin subcutaneous  5,000 Units Subcutaneous Q8H  . insulin aspart  0-20 Units Subcutaneous TID WC  . insulin aspart  4 Units Subcutaneous TID WC  . insulin glargine  15 Units Subcutaneous Daily  . ketotifen  1 drop Left Eye BID  . lidocaine      . metoprolol succinate  50 mg Oral Daily  . polyethylene glycol  17 g Oral Daily  . senna-docusate  2 tablet Oral BID  . simvastatin  40 mg Oral Daily  . tamsulosin  0.4 mg Oral QAC supper    Continuous Infusions: .  ceFAZolin (ANCEF) IV Stopped (09/13/17 0954)     LOS: 4 days     Desiree Hane, MD [facility] Pager [phone]  If 7PM-7AM, please contact  night-coverage [email or web address] Password [email or web address] 09/13/2017, 6:24 PM

## 2017-09-13 NOTE — Progress Notes (Signed)
Inpatient Diabetes Program Recommendations  AACE/ADA: New Consensus Statement on Inpatient Glycemic Control (2015)  Target Ranges:  Prepandial:   less than 140 mg/dL      Peak postprandial:   less than 180 mg/dL (1-2 hours)      Critically ill patients:  140 - 180 mg/dL   Lab Results  Component Value Date   GLUCAP 244 (H) 09/13/2017    Review of Glycemic ControlResults for JAISEN, WILTROUT (MRN [id number]) as of 09/13/2017 14:05  Ref. Range 09/12/2017 11:32 09/12/2017 16:55 09/12/2017 21:46 09/13/2017 08:02 09/13/2017 12:05  Glucose-Capillary Latest Ref Range: 65 - 99 mg/dL 297 (H) 224 (H) 250 (H) 184 (H) 244 (H)   Diabetes history: Type 2 DM Outpatient Diabetes medications: Trulicity 1.5 mg weekly, Tradjenta 5 mg daily  Current orders for Inpatient glycemic control:  Novolog resistant tid with meals, Lantus 15 units daily, Solumedrol 20 mg IV q 12 hours  Inpatient Diabetes Program Recommendations:   Please consider adding Novolog meal coverage 4 units tid with meals (hold if patient eats less than 50%).   Thanks, Adah Perl, RN, BC-ADM Inpatient Diabetes Coordinator Pager [phone] (8a-5p)

## 2017-09-14 LAB — CULTURE, BLOOD (ROUTINE X 2)
CULTURE: NO GROWTH
Culture: NO GROWTH
SPECIAL REQUESTS: ADEQUATE
SPECIAL REQUESTS: ADEQUATE

## 2017-09-14 LAB — GLUCOSE, CAPILLARY
Glucose-Capillary: 207 mg/dL — ABNORMAL HIGH (ref 65–99)
Glucose-Capillary: 273 mg/dL — ABNORMAL HIGH (ref 65–99)

## 2017-09-14 MED ORDER — GABAPENTIN 100 MG PO CAPS: 200.0000 mg | ORAL_CAPSULE | Freq: Three times a day (TID) | ORAL | 2 refills | Status: DC

## 2017-09-14 MED ORDER — POLYETHYLENE GLYCOL 3350 17 G PO PACK: 17.0000 g | PACK | Freq: Every day | ORAL | 0 refills | Status: DC

## 2017-09-14 MED ORDER — HEPARIN SOD (PORK) LOCK FLUSH 100 UNIT/ML IV SOLN
250.0000 [IU] | INTRAVENOUS | Status: AC | PRN
  Administered 2017-09-14: 250 [IU]

## 2017-09-14 MED ORDER — SENNOSIDES-DOCUSATE SODIUM 8.6-50 MG PO TABS: 2.0000 | ORAL_TABLET | Freq: Every evening | ORAL | 1 refills | Status: AC | PRN

## 2017-09-14 MED ORDER — OXYCODONE-ACETAMINOPHEN 5-325 MG PO TABS: 1.0000 | ORAL_TABLET | Freq: Four times a day (QID) | ORAL | 0 refills | Status: AC | PRN

## 2017-09-14 MED ORDER — CEFAZOLIN IV (FOR PTA / DISCHARGE USE ONLY): 1.0000 g | Freq: Two times a day (BID) | INTRAVENOUS | 0 refills | Status: AC

## 2017-09-14 NOTE — Care Management Note (Signed)
Case Management Note  Patient Details  Name: John [name] MRN: [id number] Date of Birth: 1951/05/15  Subjective/Objective:    Pt with history of OA, gout, CKD, DM who was admitted with 5 day history of fever plus acute back pain /MSSA bacteremia. From home with wife.  IHK:VQQVZD [place]  Action/Plan: Plan is to d/c to home today. [facility] to provide IV ABX home infusion.  Expected Discharge Date:  09/14/17               Expected Discharge Plan:  [place]  In-House Referral:     Discharge planning Services  CM Consult  Post Acute Care Choice:    Choice offered to:  Patient  DME Arranged:  IV pump/equipment DME Agency:  [place]:  RN [facility] Agency:  [place]  Status of Service:  Completed, signed off  If discussed at [place] of Stay Meetings, dates discussed:    Additional Comments:  Sharin Mons, RN 09/14/2017, 1:19 PM

## 2017-09-14 NOTE — Progress Notes (Signed)
Early Chars [place] to be D/C'd to home with home health per MD order.  Discussed with the patient and all questions fully answered.  VSS, Skin clean, dry and intact without evidence of skin break down, no evidence of skin tears noted. PIV catheter discontinued intact. Site without signs and symptoms of complications. Dressing and pressure applied. PICC line flushed and capped for home by IV team.  An After Visit Summary was printed and given to the patient. Patient received prescriptions.  D/c education completed with patient/family including follow up instructions, medication list, d/c activities limitations if indicated, with other d/c instructions as indicated by MD - patient able to verbalize understanding, all questions fully answered.   Patient instructed to return to ED, call 911, or call MD for any changes in condition.   Patient escorted via [place], and D/C home via private auto.  Morley Kos Price 09/14/2017 4:32 PM

## 2017-09-14 NOTE — Discharge Summary (Signed)
Discharge Summary  John [name] IOE:[id number] DOB: 06-18-1951  PCP: Imagene Riches, NP  Admit date: 09/06/2017 Discharge date: 09/14/2017  Time spent: 25 minutes  Recommendations for Outpatient Follow-up:  1.  Outpatient antibiotic therapy.  IV cefazolin X 6 end date 10/21/17. 2. Once weekly CBC and BMP, every other week ESR and CRP 3.Repeat ultrasound in 1 year for intra-abdominal aneurysm, measuring 4.6 cm    Discharge Diagnoses:  Active Hospital Problems   Diagnosis Date Noted  . Low back pain 09/07/2017  . MSSA bacteremia 09/10/2017  . Acute on chronic kidney failure ([place]) 09/07/2017  . DM (diabetes mellitus), type 2 with renal complications ([place]) 18/29/9371  . Essential hypertension 09/07/2017  . HLD (hyperlipidemia) 09/07/2017  . Back pain 09/07/2017    Resolved Hospital Problems   Diagnosis Date Noted Date Resolved  No resolved problems to display.    Discharge Condition: Fair   Diet recommendation: Diabetic diet  Vitals:   09/14/17 0539 09/14/17 0902  BP: 124/71 118/80  Pulse: 86   Resp: 14   Temp: 98.6 F (37 C)   SpO2: 98%     History of present illness:  Mr. Hilbert is a 66 year old male with medical history significant for type 2 diabetes, gout, osteoarthritis, cervical spine and lumbar fusion, CKD stage III, type 2 diabetes who  presented on 09/06/17 with low back pain and fever and was found to have infective endocarditis of aortic and mitral valve secondary to MSSA.  Hospital Course:  Principal Problem:   Low back pain Active Problems:   Acute on chronic kidney failure (HCC)   DM (diabetes mellitus), type 2 with renal complications (HCC)   Essential hypertension   HLD (hyperlipidemia)   Back pain   MSSA bacteremia  Infective endocarditis, aortic and mitral valve, secondary to MSSA Patient initially presented with acute back pain with fever of 102.5 and tachycardia to the low 100s.  Given initial concern for infection/discitis  patient underwent MRI of lumbar spine.  However no nidus of infection was found.  Patient was initially treated with high IV dose steroids for possible gouty arthritis of the spine given elevated uric acid and normal MRI lumbar spine.  Patient's clinical status improved with supportive care mentioned above: However blood cultures obtained on admission grew methicillin sensitive staph aureus.  TTE was obtained which showed concern for vegetation or aortic valve.  TEE confirmed small vegetation of both mitral and aortic valves.  All remaining blood cultures during hospital course remained negative after initiation of antibiotics.  Infectious disease was consulted.  Patient was started on cefazolin IV therapy right IJ tunneled catheter was placed for long-term antibiotic therapy on 69/67 without complications. Patient was discharged with OPAT therapy for IV cefazolin which he will continue for 6 weeks, end date 10/21/17.  Acute on chronic kidney failure, stage IV Likely prerenal in setting of acute infection.Last known creatinine prior to admission baseline of 2.74 in July 2010 on admission creatinine peaked at 4.02 with BUN of 48.  Patient's creatinine continued to trend down with fluid resuscitation and increase p.o. intake.  On discharge creatinine of 3.28 and BUN of 73.  Back pain Most likely secondary to possible sequelae of infective endocarditis although MRI remains negative for any signs of discitis in that area.  Patient's exam did improve with IV steroids however we were limited with prolonged use given acute infection.  Furthermore patient unable to use NSAIDs in the setting of his chronic kidney disease.  Discussed with patient  the importance of using warm/cold compresses, Tylenol.  Did provide patient with short prescription for oxycodone-acetaminophen (12 tabs) for severe breakthrough pain.  Type 2 diabetes Patient uses Tradjenta and Trulicity as an outpatient.  Hospital course completed by  hyperglycemia in the setting of IV steroid use.  Improved control with Lantus, sinus, insulin, mealtime insulin.  Patient will resume home medications on discharge.  Hypertension hyperlipidemia Controlled during hospital stay.  Was continued on home medications during admission and will do such on discharge.  Procedures: 09/09/18 TTE: EF 55-60%. Grade 1 diastolic dysfunction. "- Aortic valve: ECHO dense small mass on aortic valve that could be calcium or small vegetation. It is nonmobile and is upstream onthe valve. Suggest TEE to further evaluate.  09/10/18 TEE : EF 55-60%.  "Aortic valve: There was a 1.0 cm (L) x 0.2 cm (W), strand-like echodensity intermittently seen in the LVOT. It is unclear if it is attached to the aortic valve. Mitral Valve Very small 0.6 cm (L) x 0.2 cm (W)  filamentous hypodensity intermittently seen on the atrial side of the anterior leaflet.  Impression: Very small, strand-like echodensities were noted on the mitral and aortic valves. Cannot rule out early endocarditis. No clear vegetations were noted   09/13/17 right IJ tunneled CVC placement   Consultations:  Infectious disease   Discharge Exam: BP 118/80 (BP Location: Left Arm)   Pulse 86   Temp 98.6 F (37 C) (Oral)   Resp 14   Ht _0  (1.88 m)   Wt 93 kg (205 lb)   SpO2 98%   BMI 26.32 kg/m   General: Sitting up at bedside chai,r in no apparent distress, resting comfortably, pleasant in conversation  Cardiovascular: Regular rate, regular rhythm, no murmurs rubs or gallops, no appreciable JVD Respiratory: Lungs clear to auscultation bilaterally, no rales, no wheezes, no rhonchi Skin: Right IJ tunnel catheter in place with no surrounding erythema tenderness or induration  Discharge Instructions You were cared for by a hospitalist during your hospital stay. If you have any questions about your discharge medications or the care you received while you were in the hospital after you are  discharged, you can call the unit and asked to speak with the hospitalist on call if the hospitalist that took care of you is not available. Once you are discharged, your primary care physician will handle any further medical issues. Please note that NO REFILLS for any discharge medications will be authorized once you are discharged, as it is imperative that you return to your primary care physician (or establish a relationship with a primary care physician if you do not have one) for your aftercare needs so that they can reassess your need for medications and monitor your lab values.  Discharge Instructions    Diet - low sodium heart healthy    Complete by:  As directed    Home infusion instructions [facility] May follow [place] Dosing Protocol; May administer Cathflo as needed to maintain patency of vascular access device.; Flushing of vascular access device: per [facility] Protocol: 0.9% NaCl pre/post medica...    Complete by:  As directed    Instructions:  May follow [place] Dosing Protocol   Instructions:  May administer Cathflo as needed to maintain patency of vascular access device.   Instructions:  Flushing of vascular access device: per [facility] Protocol: 0.9% NaCl pre/post medication administration and prn patency; Heparin 100 u/ml, 52m for implanted ports and Heparin 10u/ml, 570mfor all other central venous  catheters.   Instructions:  May follow [facility] Anaphylaxis Protocol for First Dose Administration in the home: 0.9% NaCl at 25-50 ml/hr to maintain IV access for protocol meds. Epinephrine 0.3 ml IV/IM PRN and Benadryl 25-50 IV/IM PRN s/s of anaphylaxis.   Instructions:  [place] Infusion Coordinator (RN) to assist per patient IV care needs in the home PRN.   Increase activity slowly    Complete by:  As directed      Allergies as of 09/14/2017      Reactions   Penicillins Rash   Has patient had a PCN reaction causing immediate rash, facial/tongue/throat swelling, SOB or  lightheadedness with hypotension: Yes Has patient had a PCN reaction causing severe rash involving mucus membranes or skin necrosis: No Has patient had a PCN reaction that required hospitalization: No Has patient had a PCN reaction occurring within the last 10 years: No If all of the above answers are "NO", then may proceed with Cephalosporin use.      Medication List    STOP taking these medications   Colchicine 0.6 MG Caps     TAKE these medications   acetaminophen 500 MG tablet Commonly known as:  TYLENOL Take 1,500 mg by mouth 2 (two) times daily as needed (pain).   amLODipine 5 MG tablet Commonly known as:  NORVASC Take 5 mg by mouth daily.   aspirin EC 81 MG tablet Take 81 mg by mouth daily.   calcitRIOL 0.5 MCG capsule Commonly known as:  ROCALTROL Take 0.5 mcg by mouth daily.   ceFAZolin IVPB Commonly known as:  ANCEF Inject 1 g into the vein every 12 (twelve) hours. Indication:  endocarditis Last Day of Therapy: 10-21-17 Labs - Once weekly:  CBC/D and BMP, Labs - Every other week:  ESR and CRP   gabapentin 100 MG capsule Commonly known as:  NEURONTIN Take 2 capsules (200 mg total) by mouth 3 (three) times daily. What changed:  medication strength  how much to take   linagliptin 5 MG Tabs tablet Commonly known as:  TRADJENTA Take 5 mg by mouth daily.   metoprolol succinate 50 MG 24 hr tablet Commonly known as:  TOPROL-XL Take 50 mg by mouth daily.   OVER THE COUNTER MEDICATION Place 1 drop into both eyes 2 (two) times daily as needed (dry eyes). Hyper Tears eye drops   oxyCODONE-acetaminophen 5-325 MG tablet Commonly known as:  PERCOCET/ROXICET Take 1 tablet by mouth every 6 (six) hours as needed for severe pain.   polyethylene glycol packet Commonly known as:  MIRALAX / GLYCOLAX Take 17 g by mouth daily.   senna-docusate 8.6-50 MG tablet Commonly known as:  Senokot-S Take 2 tablets by mouth at bedtime as needed for mild constipation.     simvastatin 40 MG tablet Commonly known as:  ZOCOR Take 40 mg by mouth daily.   tamsulosin 0.4 MG Caps capsule Commonly known as:  FLOMAX Take 0.4 mg by mouth daily before supper.   TRULICITY 1.5 MB/8.4YK Sopn Generic drug:  Dulaglutide Inject 1.5 mg into the skin every Sunday.            Home Infusion Instuctions        Start     Ordered   09/14/17 0000  Home infusion instructions [facility] May follow [place] Dosing Protocol; May administer Cathflo as needed to maintain patency of vascular access device.; Flushing of vascular access device: per [facility] Protocol: 0.9% NaCl pre/post medica...    Question Answer  Comment  Instructions May follow [place] Dosing Protocol   Instructions May administer Cathflo as needed to maintain patency of vascular access device.   Instructions Flushing of vascular access device: per [facility] Protocol: 0.9% NaCl pre/post medication administration and prn patency; Heparin 100 u/ml, 73m for implanted ports and Heparin 10u/ml, 530mfor all other central venous catheters.   Instructions May follow [facility] Anaphylaxis Protocol for First Dose Administration in the home: 0.9% NaCl at 25-50 ml/hr to maintain IV access for protocol meds. Epinephrine 0.3 ml IV/IM PRN and Benadryl 25-50 IV/IM PRN s/s of anaphylaxis.   Instructions [facility] Infusion Coordinator (RN) to assist per patient IV care needs in the home PRN.      09/14/17 1037     Allergies  Allergen Reactions  . Penicillins Rash    Has patient had a PCN reaction causing immediate rash, facial/tongue/throat swelling, SOB or lightheadedness with hypotension: Yes Has patient had a PCN reaction causing severe rash involving mucus membranes or skin necrosis: No Has patient had a PCN reaction that required hospitalization: No Has patient had a PCN reaction occurring within the last 10 years: No If all of the above answers are "NO", then may proceed with Cephalosporin use.      The  results of significant diagnostics from this hospitalization (including imaging, microbiology, ancillary and laboratory) are listed below for reference.    Significant Diagnostic Studies: Dg Chest 2 View  Result Date: 09/06/2017 CLINICAL DATA:  [age]/o  M; fever and lower back pain. EXAM: CHEST  2 VIEW COMPARISON:  06/18/2009 chest radiograph FINDINGS: Stable normal cardiac silhouette given projection and technique. Aortic atherosclerosis with calcification. Clear lungs. No pleural effusion or pneumothorax. Multilevel discogenic degenerative changes of the thoracic spine. IMPRESSION: No active cardiopulmonary disease.  Aortic atherosclerosis. Electronically Signed   By: LaKristine Garbe.D.   On: 09/06/2017 20:23   Mr Lumbar Spine Wo Contrast  Addendum Date: 09/06/2017   ADDENDUM REPORT: 09/06/2017 23:50 ADDENDUM: In addition to the initially described findings, there is a tiny left subarticular disc extrusion with superior migration at L4-5 (series 2, image 9 on sagittal sequence, series 5, image 22 on axial sequence). Superior migration extends approximately 8 mm superior to the parent L4-5 interspace. This could also potentially affect the descending left L4 nerve root. Additionally, soft tissue edema noted within the lower posterior paraspinous musculature, slightly worse on the right, most likely reactive in nature due to the bilateral L4-5 facet degeneration. A component of superimposed muscular strain/injury could also be present. Electronically Signed   By: BeJeannine Boga.D.   On: 09/06/2017 23:50   Result Date: 09/06/2017 CLINICAL DATA:  Initial evaluation for left-sided lower back pain radiating into left leg. EXAM: MRI LUMBAR SPINE WITHOUT CONTRAST TECHNIQUE: Multiplanar, multisequence MR imaging of the lumbar spine was performed. No intravenous contrast was administered. COMPARISON:  Prior radiographs from 08/04/2016 as well as previous MRI from 05/29/2009. FINDINGS:  Segmentation: Normal segmentation. Lowest well-formed disc labeled the L5-S1 level. Alignment: Trace 3 mm anterolisthesis of L4 on L5. Vertebral bodies otherwise normally aligned with preservation of the normal lumbar lordosis. Vertebrae: Vertebral body heights are maintained. No evidence for acute or chronic fracture. Chronic endplate Schmorl's nodes present at the superior endplates of L2 and L3. Mild reactive endplate changes about the L3-4 interspace. No discrete or worrisome osseous lesions. Mild reactive edema about the bilateral L4-5 facets due to facet degeneration. Conus medullaris: Extends to the L1 level and appears normal. Paraspinal  and other soft tissues: Paraspinous soft tissues demonstrate no acute abnormality. Multiple T2 hyperintense cyst noted within the kidneys bilaterally. Aortic atherosclerosis with associated intra-abdominal aneurysm measuring up to 4.1 cm in size. Disc levels: L1-2: Disc desiccation without significant disc bulge. Small annular fissure noted at the level of the left neural foramen. Mild facet and ligament flavum hypertrophy. No stenosis. L2-3: Diffuse disc bulge with disc desiccation and intervertebral disc space narrowing. Superimposed broad right subarticular disc protrusion indents the right ventral thecal sac (series 5, image 12). Moderate facet and ligamentum flavum hypertrophy. Resultant mild right lateral recess and canal stenosis without neural impingement. No significant foraminal encroachment. L3-4: Diffuse disc bulge with disc desiccation and intervertebral disc space narrowing. Disc bulge a centric to the right with associated right far lateral reactive endplate changes with marginal endplate spurring. Advanced bilateral facet arthrosis, right worse than left. Patient appears to be status post partial right hemi laminectomy at this level. Residual moderate right lateral recess narrowing, potentially affecting the descending right L4 nerve root (series 5, image  18). Severe right L3 foraminal stenosis (series 4, image 5). Central canal remains widely patent. Relatively mild left foraminal narrowing. L4-5: 3 mm anterolisthesis. Associated mild diffuse disc bulge with disc desiccation. Advanced bilateral facet arthrosis with associated reactive effusions within the bilateral L4-5 facets. There is a prominent 18 mm synovial cyst at the posterior margin of the left L4-5 facet (series 5, image 27). This does not exert mass effect. Resultant mild canal with moderate bilateral lateral recess narrowing, potentially affecting either of the descending L5 nerve roots. Moderate bilateral foraminal stenosis, left greater than right. L5-S1: Disc desiccation with mild annular disc bulge. Advanced left-sided facet arthrosis. No significant canal or foraminal stenosis. IMPRESSION: 1. 3 mm anterolisthesis of L4 on L5 with associated disc bulge and advanced facet arthropathy, resulting in moderate bilateral foraminal and lateral recess stenosis. Either of the L4 or descending L5 nerve roots could be affected. 2. Right eccentric disc bulge with advanced right-sided facet arthrosis at L3-4, resulting in moderate right lateral recess and severe foraminal stenosis. Either of right L3 or L4 nerve roots could be affected. 3. Reactive marrow edema about the bilateral L4-5 facets due to facet arthritis. Finding could serve as a source for lower back pain. 4. Atherosclerosis with associated intra- abdominal aneurysm measuring up to 4.2 cm. Recommend followup by ultrasound in 1 year. This recommendation follows ACR consensus guidelines: White Paper of the ACR Incidental Findings Committee II on Vascular Findings. J Am Coll Radiol 2013; 10:789-794. Electronically Signed: By: Jeannine Boga M.D. On: 09/06/2017 23:30   Ct Hip Left Wo Contrast  Result Date: 09/07/2017 CLINICAL DATA:  Left hip pain radiating from left lower back following heavy lifting 2 days ago. EXAM: CT OF THE LEFT HIP WITHOUT  CONTRAST TECHNIQUE: Multidetector CT imaging of the left hip was performed according to the standard protocol. Multiplanar CT image reconstructions were also generated. COMPARISON:  01/14/2009 CT abdomen and pelvis FINDINGS: Bones/Joint/Cartilage Bony bridging across the left SI joint consistent with osteoarthritis. No acute fracture of the left hip and included pelvis. Degenerative subchondral cystic changes are noted of the left acetabular roof, more so posteriorly. No suspicious osseous lesions. Mild chondral thinning of the left hip joint. No joint effusion. Ligaments Suboptimally assessed by CT. Muscles and Tendons No intramuscular hemorrhage or atrophy. Soft tissues Included urinary bladder is physiologically distended. Normal size prostate with peripheral zone calcifications. Partially visualized left bi-iliac vascular grafts. IMPRESSION: Osteoarthritis of the left  hip and SI joints. No acute nor suspicious osseous abnormality. Electronically Signed   By: Ashley Royalty M.D.   On: 09/07/2017 03:30   Ir Fluoro Guide Cv Line Right  Result Date: 09/13/2017 CLINICAL DATA:  MSSA bacteremia and echocardiographic evidence of possible early endocarditis of mitral and aortic valves. Request has been made to place a tunneled central venous catheter for long-term IV antibiotics. Upper extremity PICC line cannot be placed due to history of significant chronic kidney disease. EXAM: TUNNELED CENTRAL VENOUS CATHETER PLACEMENT WITH ULTRASOUND AND FLUOROSCOPIC GUIDANCE ANESTHESIA/SEDATION: None MEDICATIONS: None FLUOROSCOPY TIME:  12 seconds.  8.9 mGy. PROCEDURE: The procedure, risks, benefits, and alternatives were explained to the patient. Questions regarding the procedure were encouraged and answered. The patient understands and consents to the procedure. A timeout was performed prior to initiating the procedure. Ultrasound was used to confirm patency of the right internal jugular vein. The right neck and chest were  prepped with chlorhexidine in a sterile fashion, and a sterile drape was applied covering the operative field. Maximum barrier sterile technique with sterile gowns and gloves were used for the procedure. Local anesthesia was provided with 1% lidocaine. After creating a small venotomy incision, a 21 gauge needle was advanced into the right internal jugular vein under direct, real-time ultrasound guidance. Ultrasound image documentation was performed. After securing guidewire access, a 6 Fr peel-away sheath was placed. A wire was kinked to measure appropriate catheter length. A 6 [place] dual-lumen Power Line was chosen for placement. The catheter was cut to 24 cm then tunneled in a retrograde fashion from the chest wall to the venotomy incision. The catheter was then placed through the sheath and the sheath removed. Final catheter positioning was confirmed and documented with a fluoroscopic spot image. The catheter was aspirated and flushed with saline. The venotomy incision was closed with subcutaneous subcuticular 4-0 Vicryl. Dermabond was applied to the incision. The catheter exit site was secured with Prolene retention sutures. COMPLICATIONS: None.  No pneumothorax. FINDINGS: After catheter placement, the tip lies at the SVC/ RA junction. The catheter aspirates normally and is ready for immediate use. IMPRESSION: Placement of tunneled central venous catheter via the right internal jugular vein. The catheter tip lies at the SVC/ RA junction. The catheter is ready for immediate use. Electronically Signed   By: Aletta Edouard M.D.   On: 09/13/2017 16:17   Ir US Guide Vasc Access Right  Result Date: 09/13/2017 CLINICAL DATA:  MSSA bacteremia and echocardiographic evidence of possible early endocarditis of mitral and aortic valves. Request has been made to place a tunneled central venous catheter for long-term IV antibiotics. Upper extremity PICC line cannot be placed due to history of significant chronic kidney  disease. EXAM: TUNNELED CENTRAL VENOUS CATHETER PLACEMENT WITH ULTRASOUND AND FLUOROSCOPIC GUIDANCE ANESTHESIA/SEDATION: None MEDICATIONS: None FLUOROSCOPY TIME:  12 seconds.  8.9 mGy. PROCEDURE: The procedure, risks, benefits, and alternatives were explained to the patient. Questions regarding the procedure were encouraged and answered. The patient understands and consents to the procedure. A timeout was performed prior to initiating the procedure. Ultrasound was used to confirm patency of the right internal jugular vein. The right neck and chest were prepped with chlorhexidine in a sterile fashion, and a sterile drape was applied covering the operative field. Maximum barrier sterile technique with sterile gowns and gloves were used for the procedure. Local anesthesia was provided with 1% lidocaine. After creating a small venotomy incision, a 21 gauge needle was advanced into the right internal jugular  vein under direct, real-time ultrasound guidance. Ultrasound image documentation was performed. After securing guidewire access, a 6 Fr peel-away sheath was placed. A wire was kinked to measure appropriate catheter length. A 6 [place] dual-lumen Power Line was chosen for placement. The catheter was cut to 24 cm then tunneled in a retrograde fashion from the chest wall to the venotomy incision. The catheter was then placed through the sheath and the sheath removed. Final catheter positioning was confirmed and documented with a fluoroscopic spot image. The catheter was aspirated and flushed with saline. The venotomy incision was closed with subcutaneous subcuticular 4-0 Vicryl. Dermabond was applied to the incision. The catheter exit site was secured with Prolene retention sutures. COMPLICATIONS: None.  No pneumothorax. FINDINGS: After catheter placement, the tip lies at the SVC/ RA junction. The catheter aspirates normally and is ready for immediate use. IMPRESSION: Placement of tunneled central venous catheter via the  right internal jugular vein. The catheter tip lies at the SVC/ RA junction. The catheter is ready for immediate use. Electronically Signed   By: Aletta Edouard M.D.   On: 09/13/2017 16:17    Microbiology: Recent Results (from the past 240 hour(s))  Culture, blood (routine x 2)     Status: None   Collection Time: 09/06/17  6:50 PM  Result Value Ref Range Status   Specimen Description BLOOD RIGHT ANTECUBITAL  Final   Special Requests   Final    BOTTLES DRAWN AEROBIC AND ANAEROBIC Blood Culture results may not be optimal due to an excessive volume of blood received in culture bottles   Culture NO GROWTH 5 DAYS  Final   Report Status 09/11/2017 FINAL  Final  Culture, blood (routine x 2)     Status: None   Collection Time: 09/07/17  2:50 PM  Result Value Ref Range Status   Specimen Description BLOOD RIGHT HAND  Final   Special Requests   Final    BOTTLES DRAWN AEROBIC AND ANAEROBIC Blood Culture adequate volume   Culture NO GROWTH 5 DAYS  Final   Report Status 09/12/2017 FINAL  Final  Culture, blood (routine x 2)     Status: Abnormal   Collection Time: 09/07/17  2:59 PM  Result Value Ref Range Status   Specimen Description BLOOD LEFT ANTECUBITAL  Final   Special Requests   Final    BOTTLES DRAWN AEROBIC AND ANAEROBIC Blood Culture results may not be optimal due to an excessive volume of blood received in culture bottles   Culture  Setup Time   Final    GRAM POSITIVE COCCI IN CLUSTERS AEROBIC BOTTLE ONLY CRITICAL RESULT CALLED TO, READ BACK BY AND VERIFIED WITH: Alvino Chapel.D. 12:35 09/09/17 ([email or web address])    Culture STAPHYLOCOCCUS AUREUS (A)  Final   Report Status 09/11/2017 FINAL  Final   Organism ID, Bacteria STAPHYLOCOCCUS AUREUS  Final      Susceptibility   Staphylococcus aureus - MIC*    CIPROFLOXACIN <=0.5 SENSITIVE Sensitive     ERYTHROMYCIN <=0.25 SENSITIVE Sensitive     GENTAMICIN <=0.5 SENSITIVE Sensitive     OXACILLIN 0.5 SENSITIVE Sensitive     TETRACYCLINE <=1  SENSITIVE Sensitive     VANCOMYCIN <=0.5 SENSITIVE Sensitive     TRIMETH/SULFA <=10 SENSITIVE Sensitive     CLINDAMYCIN <=0.25 SENSITIVE Sensitive     RIFAMPIN <=0.5 SENSITIVE Sensitive     Inducible Clindamycin NEGATIVE Sensitive     * STAPHYLOCOCCUS AUREUS  Blood Culture ID Panel (Reflexed)     Status: Abnormal  Collection Time: 09/07/17  2:59 PM  Result Value Ref Range Status   Enterococcus species NOT DETECTED NOT DETECTED Final   Listeria monocytogenes NOT DETECTED NOT DETECTED Final   Staphylococcus species DETECTED (A) NOT DETECTED Final    Comment: CRITICAL RESULT CALLED TO, READ BACK BY AND VERIFIED WITH: Alvino Chapel.D. 12:35 09/09/17 ([email or web address])    Staphylococcus aureus DETECTED (A) NOT DETECTED Final    Comment: Methicillin (oxacillin) susceptible Staphylococcus aureus (MSSA). Preferred therapy is anti staphylococcal beta lactam antibiotic (Cefazolin or Nafcillin), unless clinically contraindicated. CRITICAL RESULT CALLED TO, READ BACK BY AND VERIFIED WITH: Alvino Chapel.D. 12:35 09/09/17 ([email or web address])    Methicillin resistance NOT DETECTED NOT DETECTED Final   Streptococcus species NOT DETECTED NOT DETECTED Final   Streptococcus agalactiae NOT DETECTED NOT DETECTED Final   Streptococcus pneumoniae NOT DETECTED NOT DETECTED Final   Streptococcus pyogenes NOT DETECTED NOT DETECTED Final   Acinetobacter baumannii NOT DETECTED NOT DETECTED Final   Enterobacteriaceae species NOT DETECTED NOT DETECTED Final   Enterobacter cloacae complex NOT DETECTED NOT DETECTED Final   Escherichia coli NOT DETECTED NOT DETECTED Final   Klebsiella oxytoca NOT DETECTED NOT DETECTED Final   Klebsiella pneumoniae NOT DETECTED NOT DETECTED Final   Proteus species NOT DETECTED NOT DETECTED Final   Serratia marcescens NOT DETECTED NOT DETECTED Final   Haemophilus influenzae NOT DETECTED NOT DETECTED Final   Neisseria meningitidis NOT DETECTED NOT DETECTED Final   Pseudomonas aeruginosa NOT  DETECTED NOT DETECTED Final   Candida albicans NOT DETECTED NOT DETECTED Final   Candida glabrata NOT DETECTED NOT DETECTED Final   Candida krusei NOT DETECTED NOT DETECTED Final   Candida parapsilosis NOT DETECTED NOT DETECTED Final   Candida tropicalis NOT DETECTED NOT DETECTED Final  Culture, blood (Routine X 2) w Reflex to ID Panel     Status: None (Preliminary result)   Collection Time: 09/09/17  7:18 PM  Result Value Ref Range Status   Specimen Description BLOOD LEFT ANTECUBITAL  Final   Special Requests IN PEDIATRIC BOTTLE Blood Culture adequate volume  Final   Culture NO GROWTH 4 DAYS  Final   Report Status PENDING  Incomplete  Culture, blood (Routine X 2) w Reflex to ID Panel     Status: None (Preliminary result)   Collection Time: 09/09/17  7:18 PM  Result Value Ref Range Status   Specimen Description BLOOD BLOOD LEFT FOREARM  Final   Special Requests IN PEDIATRIC BOTTLE Blood Culture adequate volume  Final   Culture NO GROWTH 4 DAYS  Final   Report Status PENDING  Incomplete  Culture, blood (Routine X 2) w Reflex to ID Panel     Status: None (Preliminary result)   Collection Time: 09/10/17  6:50 PM  Result Value Ref Range Status   Specimen Description BLOOD LEFT HAND  Final   Special Requests   Final    BOTTLES DRAWN AEROBIC ONLY Blood Culture adequate volume   Culture NO GROWTH 3 DAYS  Final   Report Status PENDING  Incomplete  Culture, blood (Routine X 2) w Reflex to ID Panel     Status: None (Preliminary result)   Collection Time: 09/10/17  6:50 PM  Result Value Ref Range Status   Specimen Description BLOOD RIGHT HAND  Final   Special Requests   Final    BOTTLES DRAWN AEROBIC ONLY Blood Culture adequate volume   Culture NO GROWTH 3 DAYS  Final   Report Status PENDING  Incomplete  Culture, blood (Routine  X 2) w Reflex to ID Panel     Status: None (Preliminary result)   Collection Time: 09/12/17  9:23 AM  Result Value Ref Range Status   Specimen Description BLOOD  LEFT WRIST  Final   Special Requests IN PEDIATRIC BOTTLE Blood Culture adequate volume  Final   Culture NO GROWTH < 24 HOURS  Final   Report Status PENDING  Incomplete  Culture, blood (Routine X 2) w Reflex to ID Panel     Status: None (Preliminary result)   Collection Time: 09/12/17  9:30 AM  Result Value Ref Range Status   Specimen Description BLOOD LEFT WRIST  Final   Special Requests IN PEDIATRIC BOTTLE Blood Culture adequate volume  Final   Culture NO GROWTH < 24 HOURS  Final   Report Status PENDING  Incomplete     Labs: Basic Metabolic Panel:  Recent Labs Lab 09/10/17 0505 09/11/17 0410 09/12/17 0923 09/13/17 0744  NA 129* 131* 136 138  K 5.0 4.4 4.6 5.3*  CL 94* 95* 106 109  CO2 21* 21* 17* 20*  GLUCOSE 323* 305* 201* 199*  BUN 78* 92* 84* 73*  CREATININE 3.76* 4.08* 3.55* 3.28*  CALCIUM 9.2 9.4 8.9 8.9   Liver Function Tests: No results for input(s): AST, ALT, ALKPHOS, BILITOT, PROT, ALBUMIN in the last 168 hours. No results for input(s): LIPASE, AMYLASE in the last 168 hours. No results for input(s): AMMONIA in the last 168 hours. CBC:  Recent Labs Lab 09/10/17 0505 09/12/17 0923 09/13/17 0744  WBC 10.5 12.8* 12.0*  NEUTROABS  --   --  10.0*  HGB 12.4* 13.0 12.3*  HCT 36.1* 38.5* 37.1*  MCV 87.2 88.5 89.4  PLT 266 280 289   Cardiac Enzymes: No results for input(s): CKTOTAL, CKMB, CKMBINDEX, TROPONINI in the last 168 hours. BNP: BNP (last 3 results) No results for input(s): BNP in the last 8760 hours.  ProBNP (last 3 results) No results for input(s): PROBNP in the last 8760 hours.  CBG:  Recent Labs Lab 09/13/17 0802 09/13/17 1205 09/13/17 1703 09/13/17 2122 09/14/17 0807  GLUCAP 184* 244* 184* 238* 207*       Signed:  Desiree Hane, MD [facility] 09/14/2017, 12:17 PM

## 2017-09-15 LAB — CULTURE, BLOOD (ROUTINE X 2)
Culture: NO GROWTH
Culture: NO GROWTH
SPECIAL REQUESTS: ADEQUATE
Special Requests: ADEQUATE

## 2017-09-17 LAB — CULTURE, BLOOD (ROUTINE X 2)
CULTURE: NO GROWTH
Culture: NO GROWTH
SPECIAL REQUESTS: ADEQUATE
SPECIAL REQUESTS: ADEQUATE

## 2017-10-01 ENCOUNTER — Other Ambulatory Visit: Payer: Self-pay | Admitting: Pharmacist

## 2017-10-06 ENCOUNTER — Telehealth: Payer: Self-pay

## 2017-10-06 NOTE — Telephone Encounter (Signed)
Voicemail:  10-05-17 Morey Hummingbird, RN calling to report patient was not able to use purple port on yesterday. RN was able to aspirate blood and it flushed  with  Resistance with saline and heparin.  Patien has a dual prot and is now using the red port for now.  She wanted to notify the physician of possible fibrin sheath developing on the purple lumen.  Dewight Catino K Yaron Grasse,R

## 2017-10-22 ENCOUNTER — Encounter (HOSPITAL_COMMUNITY): Payer: Self-pay | Admitting: Radiology

## 2017-10-22 ENCOUNTER — Ambulatory Visit (HOSPITAL_COMMUNITY)
Admission: RE | Admit: 2017-10-22 | Discharge: 2017-10-22 | Disposition: A | Discharge status: Home / Self Care | Payer: 59 | Source: Ambulatory Visit | Attending: Family | Admitting: Family

## 2017-10-22 ENCOUNTER — Other Ambulatory Visit (HOSPITAL_COMMUNITY): Payer: Self-pay | Admitting: Family

## 2017-10-22 DIAGNOSIS — Z452 Encounter for adjustment and management of vascular access device: Secondary | ICD-10-CM | POA: Diagnosis present

## 2017-10-22 HISTORY — PX: IR REMOVAL TUN CV CATH W/O FL: IMG2289

## 2017-10-22 MED ORDER — LIDOCAINE HCL 1 % IJ SOLN
INTRAMUSCULAR | Status: AC
  Filled 2017-10-22: qty 20

## 2017-10-22 MED ORDER — CHLORHEXIDINE GLUCONATE 4 % EX LIQD
CUTANEOUS | Status: AC
  Filled 2017-10-22: qty 15

## 2017-10-22 NOTE — Procedures (Signed)
Successful removal of tunneled (R)IJ CVC No complications  Ascencion Dike PA-C Interventional Radiology 10/22/2017 1:04 PM

## 2017-11-26 ENCOUNTER — Other Ambulatory Visit: Payer: Self-pay | Admitting: Neurosurgery

## 2017-11-26 DIAGNOSIS — Z981 Arthrodesis status: Secondary | ICD-10-CM

## 2017-11-30 ENCOUNTER — Ambulatory Visit
Admission: RE | Admit: 2017-11-30 | Discharge: 2017-11-30 | Disposition: A | Discharge status: Home / Self Care | Payer: 59 | Source: Ambulatory Visit | Attending: Neurosurgery | Admitting: Neurosurgery

## 2017-11-30 ENCOUNTER — Other Ambulatory Visit: Payer: 59

## 2017-11-30 DIAGNOSIS — Z981 Arthrodesis status: Secondary | ICD-10-CM

## 2018-08-26 ENCOUNTER — Other Ambulatory Visit: Payer: Self-pay

## 2018-08-26 DIAGNOSIS — N185 Chronic kidney disease, stage 5: Secondary | ICD-10-CM

## 2018-10-05 ENCOUNTER — Encounter: Payer: Self-pay | Admitting: *Deleted

## 2018-10-05 ENCOUNTER — Other Ambulatory Visit: Payer: Self-pay

## 2018-10-05 ENCOUNTER — Ambulatory Visit (HOSPITAL_COMMUNITY)
Admission: RE | Admit: 2018-10-05 | Discharge: 2018-10-05 | Disposition: A | Discharge status: Home / Self Care | Payer: 59 | Source: Ambulatory Visit | Attending: Vascular Surgery | Admitting: Vascular Surgery

## 2018-10-05 ENCOUNTER — Ambulatory Visit (INDEPENDENT_AMBULATORY_CARE_PROVIDER_SITE_OTHER)
Admission: RE | Admit: 2018-10-05 | Discharge: 2018-10-05 | Disposition: A | Discharge status: Home / Self Care | Payer: 59 | Source: Ambulatory Visit | Attending: Vascular Surgery | Admitting: Vascular Surgery

## 2018-10-05 ENCOUNTER — Encounter: Payer: Self-pay | Admitting: Vascular Surgery

## 2018-10-05 ENCOUNTER — Ambulatory Visit (INDEPENDENT_AMBULATORY_CARE_PROVIDER_SITE_OTHER): Payer: 59 | Admitting: Vascular Surgery

## 2018-10-05 ENCOUNTER — Other Ambulatory Visit: Payer: Self-pay | Admitting: *Deleted

## 2018-10-05 VITALS — BP 161/91 | HR 82 | Temp 97.1°F | Resp 16 | Ht 72.0 in | Wt 207.0 lb

## 2018-10-05 DIAGNOSIS — N185 Chronic kidney disease, stage 5: Secondary | ICD-10-CM | POA: Diagnosis not present

## 2018-10-05 NOTE — H&P (View-Only) (Signed)
REASON FOR CONSULT:    To evaluate for hemodialysis access.  The consult is requested by Dr. Erling Cruz.  HPI:   John [name] is a pleasant 67 y.o. male, who was referred for evaluation of hemodialysis access.  I have reviewed the records from the referring office.  The patient has stage IV chronic kidney disease based on the note from 10-19.  It is felt that this is likely related to diabetic nephropathy and hypertensive nephropathy.  He has a known renal artery stenosis by MRI.  His GFR on 10-19 was 14.  Creatinine was 4.25.  The referring notes requested a fistula but did not comment on whether or not we should wait to place an AV graft if the fistula was not possible.  The patient denies any recent uremic symptoms.  Specifically he denies nausea, vomiting, fatigue, anorexia, or palpitations.  He is right-handed.  He has had previous PTCA in December 2001 after a myocardial infarction.  He said no recent cardiac issues.  Past Medical History:  Diagnosis Date  . Diabetes mellitus without complication ([place])   . Hypertension   . MI (myocardial infarction) ([place])   . Renal disorder     Family History  Problem Relation Age of Onset  . Cancer Maternal Grandmother     SOCIAL HISTORY: Social History   Socioeconomic History  . Marital status: Married    Spouse name: Not on file  . Number of children: Not on file  . Years of education: Not on file  . Highest education level: Not on file  Occupational History  . Not on file  Social Needs  . Financial resource strain: Not on file  . Food insecurity:    Worry: Not on file    Inability: Not on file  . Transportation needs:    Medical: Not on file    Non-medical: Not on file  Tobacco Use  . Smoking status: Former Research scientist (life sciences)  . Smokeless tobacco: Never Used  Substance and Sexual Activity  . Alcohol use: Yes    Comment: Occasionally.  . Drug use: Not on file  . Sexual activity: Not on file  Lifestyle  . Physical  activity:    Days per week: Not on file    Minutes per session: Not on file  . Stress: Not on file  Relationships  . Social connections:    Talks on phone: Not on file    Gets together: Not on file    Attends religious service: Not on file    Active member of club or organization: Not on file    Attends meetings of clubs or organizations: Not on file    Relationship status: Not on file  . Intimate partner violence:    Fear of current or ex partner: Not on file    Emotionally abused: Not on file    Physically abused: Not on file    Forced sexual activity: Not on file  Other Topics Concern  . Not on file  Social History Narrative  . Not on file    Allergies  Allergen Reactions  . Penicillins Rash    Has patient had a PCN reaction causing immediate rash, facial/tongue/throat swelling, SOB or lightheadedness with hypotension: Yes Has patient had a PCN reaction causing severe rash involving mucus membranes or skin necrosis: No Has patient had a PCN reaction that required hospitalization: No Has patient had a PCN reaction occurring within the last 10 years: No If all of the above answers  are "NO", then may proceed with Cephalosporin use.    Current Outpatient Medications  Medication Sig Dispense Refill  . acetaminophen (TYLENOL) 500 MG tablet Take 1,500 mg by mouth 2 (two) times daily as needed (pain).    Marland Kitchen amLODipine (NORVASC) 5 MG tablet Take 5 mg by mouth daily.  12  . aspirin EC 81 MG tablet Take 81 mg by mouth daily.    . calcitRIOL (ROCALTROL) 0.5 MCG capsule Take 0.5 mcg by mouth daily.  3  . Dulaglutide (TRULICITY) 1.5 PN/3.6RW SOPN Inject 1.5 mg into the skin every Sunday.    . gabapentin (NEURONTIN) 100 MG capsule Take 2 capsules (200 mg total) by mouth 3 (three) times daily. 90 capsule 2  . linagliptin (TRADJENTA) 5 MG TABS tablet Take 5 mg by mouth daily.    . metoprolol succinate (TOPROL-XL) 50 MG 24 hr tablet Take 50 mg by mouth daily.  0  . OVER THE COUNTER  MEDICATION Place 1 drop into both eyes 2 (two) times daily as needed (dry eyes). Hyper Tears eye drops    . polyethylene glycol (MIRALAX / GLYCOLAX) packet Take 17 g by mouth daily. 14 each 0  . simvastatin (ZOCOR) 40 MG tablet Take 40 mg by mouth daily.    . tamsulosin (FLOMAX) 0.4 MG CAPS capsule Take 0.4 mg by mouth daily before supper.  2   No current facility-administered medications for this visit.     REVIEW OF SYSTEMS:  [X]  denotes positive finding, [ ]  denotes negative finding Cardiac  Comments:  Chest pain or chest pressure:    Shortness of breath upon exertion:    Short of breath when lying flat:    Irregular heart rhythm:        Vascular    Pain in calf, thigh, or hip brought on by ambulation: x   Pain in feet at night that wakes you up from your sleep:     Blood clot in your veins:    Leg swelling:         Pulmonary    Oxygen at home:    Productive cough:     Wheezing:         Neurologic    Sudden weakness in arms or legs:     Sudden numbness in arms or legs:     Sudden onset of difficulty speaking or slurred speech:    Temporary loss of vision in one eye:     Problems with dizziness:         Gastrointestinal    Blood in stool:     Vomited blood:         Genitourinary    Burning when urinating:     Blood in urine:        Psychiatric    Major depression:         Hematologic    Bleeding problems:    Problems with blood clotting too easily:        Skin    Rashes or ulcers:        Constitutional    Fever or chills:     PHYSICAL EXAM:   Vitals:   10/05/18 1323  BP: (!) 161/91  Pulse: 82  Resp: 16  Temp: (!) 97.1 F (36.2 C)  TempSrc: Oral  SpO2: 99%  Weight: 207 lb (93.9 kg)  Height: 6' (1.829 m)    GENERAL: The patient is a well-nourished male, in no acute distress. The vital signs are documented above. CARDIAC:  There is a regular rate and rhythm.  VASCULAR: I do not detect carotid bruits. He has palpable brachial and radial pulses  bilaterally. He has no significant lower extremity swelling. PULMONARY: There is good air exchange bilaterally without wheezing or rales. ABDOMEN: Soft and non-tender with normal pitched bowel sounds.  MUSCULOSKELETAL: There are no major deformities or cyanosis. NEUROLOGIC: No focal weakness or paresthesias are detected. SKIN: There are no ulcers or rashes noted. PSYCHIATRIC: The patient has a normal affect.  DATA:    VEIN MAP: I have independently interpreted his vein map today.  On the right side the forearm cephalic vein is marginal in size.  The upper arm cephalic vein is potentially usable.  The basilic vein is reasonable in size although it empties into the brachial system early.  On the left side the forearm and upper arm cephalic vein did not appear to be adequate.  The basilic vein is marginal in size and empties into the brachial system early.   ASSESSMENT & PLAN:   STAGE IV CHRONIC KIDNEY DISEASE: Based on his vein map I think his best option for an AV fistula would be a right brachiocephalic AV fistula.  The second option would be a basilic vein transposition.  Given that he has stage IV chronic kidney disease I would be reluctant to place an AV graft at this time.  I have explained that if we do a basilic vein transposition this could potentially be done in 2 stages or one stage depending on the quality of the vein.  I have discussed the indications for the procedure and the potential complications including but not limited to failure of the fistula to mature, the need for subsequent revisions or angioplasty, and steal syndrome.  All of his questions were answered and he is agreeable to proceed.  His surgery has been scheduled for 10/18/2018.  Deitra Mayo [facility] [phone]

## 2018-10-05 NOTE — Progress Notes (Signed)
REASON FOR CONSULT:    To evaluate for hemodialysis access.  The consult is requested by Dr. Erling Cruz.  HPI:   John [name] is a pleasant 67 y.o. male, who was referred for evaluation of hemodialysis access.  I have reviewed the records from the referring office.  The patient has stage IV chronic kidney disease based on the note from 10-19.  It is felt that this is likely related to diabetic nephropathy and hypertensive nephropathy.  He has a known renal artery stenosis by MRI.  His GFR on 10-19 was 14.  Creatinine was 4.25.  The referring notes requested a fistula but did not comment on whether or not we should wait to place an AV graft if the fistula was not possible.  The patient denies any recent uremic symptoms.  Specifically he denies nausea, vomiting, fatigue, anorexia, or palpitations.  He is right-handed.  He has had previous PTCA in December 2001 after a myocardial infarction.  He said no recent cardiac issues.  Past Medical History:  Diagnosis Date  . Diabetes mellitus without complication ([place])   . Hypertension   . MI (myocardial infarction) ([place])   . Renal disorder     Family History  Problem Relation Age of Onset  . Cancer Maternal Grandmother     SOCIAL HISTORY: Social History   Socioeconomic History  . Marital status: Married    Spouse name: Not on file  . Number of children: Not on file  . Years of education: Not on file  . Highest education level: Not on file  Occupational History  . Not on file  Social Needs  . Financial resource strain: Not on file  . Food insecurity:    Worry: Not on file    Inability: Not on file  . Transportation needs:    Medical: Not on file    Non-medical: Not on file  Tobacco Use  . Smoking status: Former Research scientist (life sciences)  . Smokeless tobacco: Never Used  Substance and Sexual Activity  . Alcohol use: Yes    Comment: Occasionally.  . Drug use: Not on file  . Sexual activity: Not on file  Lifestyle  . Physical  activity:    Days per week: Not on file    Minutes per session: Not on file  . Stress: Not on file  Relationships  . Social connections:    Talks on phone: Not on file    Gets together: Not on file    Attends religious service: Not on file    Active member of club or organization: Not on file    Attends meetings of clubs or organizations: Not on file    Relationship status: Not on file  . Intimate partner violence:    Fear of current or ex partner: Not on file    Emotionally abused: Not on file    Physically abused: Not on file    Forced sexual activity: Not on file  Other Topics Concern  . Not on file  Social History Narrative  . Not on file    Allergies  Allergen Reactions  . Penicillins Rash    Has patient had a PCN reaction causing immediate rash, facial/tongue/throat swelling, SOB or lightheadedness with hypotension: Yes Has patient had a PCN reaction causing severe rash involving mucus membranes or skin necrosis: No Has patient had a PCN reaction that required hospitalization: No Has patient had a PCN reaction occurring within the last 10 years: No If all of the above answers  are "NO", then may proceed with Cephalosporin use.    Current Outpatient Medications  Medication Sig Dispense Refill  . acetaminophen (TYLENOL) 500 MG tablet Take 1,500 mg by mouth 2 (two) times daily as needed (pain).    Marland Kitchen amLODipine (NORVASC) 5 MG tablet Take 5 mg by mouth daily.  12  . aspirin EC 81 MG tablet Take 81 mg by mouth daily.    . calcitRIOL (ROCALTROL) 0.5 MCG capsule Take 0.5 mcg by mouth daily.  3  . Dulaglutide (TRULICITY) 1.5 GY/6.9SW SOPN Inject 1.5 mg into the skin every Sunday.    . gabapentin (NEURONTIN) 100 MG capsule Take 2 capsules (200 mg total) by mouth 3 (three) times daily. 90 capsule 2  . linagliptin (TRADJENTA) 5 MG TABS tablet Take 5 mg by mouth daily.    . metoprolol succinate (TOPROL-XL) 50 MG 24 hr tablet Take 50 mg by mouth daily.  0  . OVER THE COUNTER  MEDICATION Place 1 drop into both eyes 2 (two) times daily as needed (dry eyes). Hyper Tears eye drops    . polyethylene glycol (MIRALAX / GLYCOLAX) packet Take 17 g by mouth daily. 14 each 0  . simvastatin (ZOCOR) 40 MG tablet Take 40 mg by mouth daily.    . tamsulosin (FLOMAX) 0.4 MG CAPS capsule Take 0.4 mg by mouth daily before supper.  2   No current facility-administered medications for this visit.     REVIEW OF SYSTEMS:  [X]  denotes positive finding, [ ]  denotes negative finding Cardiac  Comments:  Chest pain or chest pressure:    Shortness of breath upon exertion:    Short of breath when lying flat:    Irregular heart rhythm:        Vascular    Pain in calf, thigh, or hip brought on by ambulation: x   Pain in feet at night that wakes you up from your sleep:     Blood clot in your veins:    Leg swelling:         Pulmonary    Oxygen at home:    Productive cough:     Wheezing:         Neurologic    Sudden weakness in arms or legs:     Sudden numbness in arms or legs:     Sudden onset of difficulty speaking or slurred speech:    Temporary loss of vision in one eye:     Problems with dizziness:         Gastrointestinal    Blood in stool:     Vomited blood:         Genitourinary    Burning when urinating:     Blood in urine:        Psychiatric    Major depression:         Hematologic    Bleeding problems:    Problems with blood clotting too easily:        Skin    Rashes or ulcers:        Constitutional    Fever or chills:     PHYSICAL EXAM:   Vitals:   10/05/18 1323  BP: (!) 161/91  Pulse: 82  Resp: 16  Temp: (!) 97.1 F (36.2 C)  TempSrc: Oral  SpO2: 99%  Weight: 207 lb (93.9 kg)  Height: 6' (1.829 m)    GENERAL: The patient is a well-nourished male, in no acute distress. The vital signs are documented above. CARDIAC:  There is a regular rate and rhythm.  VASCULAR: I do not detect carotid bruits. He has palpable brachial and radial pulses  bilaterally. He has no significant lower extremity swelling. PULMONARY: There is good air exchange bilaterally without wheezing or rales. ABDOMEN: Soft and non-tender with normal pitched bowel sounds.  MUSCULOSKELETAL: There are no major deformities or cyanosis. NEUROLOGIC: No focal weakness or paresthesias are detected. SKIN: There are no ulcers or rashes noted. PSYCHIATRIC: The patient has a normal affect.  DATA:    VEIN MAP: I have independently interpreted his vein map today.  On the right side the forearm cephalic vein is marginal in size.  The upper arm cephalic vein is potentially usable.  The basilic vein is reasonable in size although it empties into the brachial system early.  On the left side the forearm and upper arm cephalic vein did not appear to be adequate.  The basilic vein is marginal in size and empties into the brachial system early.   ASSESSMENT & PLAN:   STAGE IV CHRONIC KIDNEY DISEASE: Based on his vein map I think his best option for an AV fistula would be a right brachiocephalic AV fistula.  The second option would be a basilic vein transposition.  Given that he has stage IV chronic kidney disease I would be reluctant to place an AV graft at this time.  I have explained that if we do a basilic vein transposition this could potentially be done in 2 stages or one stage depending on the quality of the vein.  I have discussed the indications for the procedure and the potential complications including but not limited to failure of the fistula to mature, the need for subsequent revisions or angioplasty, and steal syndrome.  All of his questions were answered and he is agreeable to proceed.  His surgery has been scheduled for 10/18/2018.  Deitra Mayo [facility] [phone]

## 2018-10-17 ENCOUNTER — Encounter (HOSPITAL_COMMUNITY): Payer: Self-pay | Admitting: Surgery

## 2018-10-17 ENCOUNTER — Other Ambulatory Visit: Payer: Self-pay

## 2018-10-17 NOTE — Progress Notes (Signed)
PCP - Dr. Heide Scales Cardiologist - Dr. Nehemiah Massed  Chest x-ray - 8/19  EKG - denies Stress Test - denies ECHO - denies Cardiac Cath - with stenting, > 46yrs ago  Checks Blood Sugar 2 times a day  Aspirin Instructions: continue  Patient denies shortness of breath, fever, cough and chest pain.  Patient verbalized understanding of instructions: NPO after midnight, arrive at 0530, check sugar in am, if <70 drink 4 oz of clear juice and recheck.

## 2018-10-18 ENCOUNTER — Ambulatory Visit (HOSPITAL_COMMUNITY)
Admission: RE | Admit: 2018-10-18 | Discharge: 2018-10-18 | Disposition: A | Discharge status: Home / Self Care | Payer: 59 | Source: Ambulatory Visit | Attending: Vascular Surgery | Admitting: Vascular Surgery

## 2018-10-18 ENCOUNTER — Other Ambulatory Visit: Payer: Self-pay

## 2018-10-18 ENCOUNTER — Encounter (HOSPITAL_COMMUNITY): Payer: Self-pay | Admitting: *Deleted

## 2018-10-18 ENCOUNTER — Encounter (HOSPITAL_COMMUNITY)
Admission: RE | Disposition: A | Discharge status: Home / Self Care | Payer: Self-pay | Source: Ambulatory Visit | Attending: Vascular Surgery

## 2018-10-18 ENCOUNTER — Ambulatory Visit (HOSPITAL_COMMUNITY): Payer: 59 | Admitting: Certified Registered Nurse Anesthetist

## 2018-10-18 DIAGNOSIS — N184 Chronic kidney disease, stage 4 (severe): Secondary | ICD-10-CM | POA: Insufficient documentation

## 2018-10-18 DIAGNOSIS — I701 Atherosclerosis of renal artery: Secondary | ICD-10-CM | POA: Diagnosis not present

## 2018-10-18 DIAGNOSIS — E1122 Type 2 diabetes mellitus with diabetic chronic kidney disease: Secondary | ICD-10-CM | POA: Insufficient documentation

## 2018-10-18 DIAGNOSIS — I2510 Atherosclerotic heart disease of native coronary artery without angina pectoris: Secondary | ICD-10-CM | POA: Insufficient documentation

## 2018-10-18 DIAGNOSIS — Z794 Long term (current) use of insulin: Secondary | ICD-10-CM | POA: Insufficient documentation

## 2018-10-18 DIAGNOSIS — I252 Old myocardial infarction: Secondary | ICD-10-CM | POA: Diagnosis not present

## 2018-10-18 DIAGNOSIS — Z87891 Personal history of nicotine dependence: Secondary | ICD-10-CM | POA: Insufficient documentation

## 2018-10-18 DIAGNOSIS — I129 Hypertensive chronic kidney disease with stage 1 through stage 4 chronic kidney disease, or unspecified chronic kidney disease: Secondary | ICD-10-CM | POA: Insufficient documentation

## 2018-10-18 DIAGNOSIS — Z955 Presence of coronary angioplasty implant and graft: Secondary | ICD-10-CM | POA: Insufficient documentation

## 2018-10-18 DIAGNOSIS — Z809 Family history of malignant neoplasm, unspecified: Secondary | ICD-10-CM | POA: Diagnosis not present

## 2018-10-18 DIAGNOSIS — Z79899 Other long term (current) drug therapy: Secondary | ICD-10-CM | POA: Insufficient documentation

## 2018-10-18 DIAGNOSIS — Z7982 Long term (current) use of aspirin: Secondary | ICD-10-CM | POA: Diagnosis not present

## 2018-10-18 HISTORY — PX: AV FISTULA PLACEMENT: SHX1204

## 2018-10-18 LAB — POCT I-STAT 4, (NA,K, GLUC, HGB,HCT)
Glucose, Bld: 167 mg/dL — ABNORMAL HIGH (ref 70–99)
HCT: 35 % — ABNORMAL LOW (ref 39.0–52.0)
Hemoglobin: 11.9 g/dL — ABNORMAL LOW (ref 13.0–17.0)
Potassium: 5 mmol/L (ref 3.5–5.1)
Sodium: 140 mmol/L (ref 135–145)

## 2018-10-18 LAB — GLUCOSE, CAPILLARY
Glucose-Capillary: 159 mg/dL — ABNORMAL HIGH (ref 70–99)
Glucose-Capillary: 184 mg/dL — ABNORMAL HIGH (ref 70–99)

## 2018-10-18 SURGERY — ARTERIOVENOUS (AV) FISTULA CREATION
Anesthesia: Monitor Anesthesia Care | Site: Arm Upper | Laterality: Right

## 2018-10-18 MED ORDER — SODIUM CHLORIDE 0.9 % IV SOLN
INTRAVENOUS | Status: DC | PRN
  Administered 2018-10-18: 20 ug/min via INTRAVENOUS

## 2018-10-18 MED ORDER — FENTANYL CITRATE (PF) 250 MCG/5ML IJ SOLN
INTRAMUSCULAR | Status: DC | PRN
  Administered 2018-10-18: 25 ug via INTRAVENOUS

## 2018-10-18 MED ORDER — HYDROMORPHONE HCL 1 MG/ML IJ SOLN: 0.2500 mg | INTRAMUSCULAR | Status: DC | PRN

## 2018-10-18 MED ORDER — SODIUM CHLORIDE 0.9 % IV SOLN
INTRAVENOUS | Status: AC
  Filled 2018-10-18: qty 1.2

## 2018-10-18 MED ORDER — PROPOFOL 500 MG/50ML IV EMUL
INTRAVENOUS | Status: DC | PRN
  Administered 2018-10-18: 100 ug/kg/min via INTRAVENOUS

## 2018-10-18 MED ORDER — ONDANSETRON HCL 4 MG/2ML IJ SOLN
INTRAMUSCULAR | Status: DC | PRN
  Administered 2018-10-18: 4 mg via INTRAVENOUS

## 2018-10-18 MED ORDER — LIDOCAINE HCL (PF) 1 % IJ SOLN
INTRAMUSCULAR | Status: AC
  Filled 2018-10-18: qty 30

## 2018-10-18 MED ORDER — LIDOCAINE-EPINEPHRINE (PF) 1 %-1:200000 IJ SOLN
INTRAMUSCULAR | Status: AC
  Filled 2018-10-18: qty 30

## 2018-10-18 MED ORDER — PROTAMINE SULFATE 10 MG/ML IV SOLN
INTRAVENOUS | Status: AC
  Filled 2018-10-18: qty 10

## 2018-10-18 MED ORDER — MIDAZOLAM HCL 5 MG/5ML IJ SOLN
INTRAMUSCULAR | Status: DC | PRN
  Administered 2018-10-18: 2 mg via INTRAVENOUS

## 2018-10-18 MED ORDER — HEPARIN SODIUM (PORCINE) 1000 UNIT/ML IJ SOLN
INTRAMUSCULAR | Status: AC
  Filled 2018-10-18: qty 1

## 2018-10-18 MED ORDER — PHENYLEPHRINE 40 MCG/ML (10ML) SYRINGE FOR IV PUSH (FOR BLOOD PRESSURE SUPPORT)
PREFILLED_SYRINGE | INTRAVENOUS | Status: AC
  Filled 2018-10-18: qty 10

## 2018-10-18 MED ORDER — SODIUM CHLORIDE 0.9 % IV SOLN
INTRAVENOUS | Status: DC | PRN
  Administered 2018-10-18: 07:00:00

## 2018-10-18 MED ORDER — ONDANSETRON HCL 4 MG/2ML IJ SOLN
INTRAMUSCULAR | Status: AC
  Filled 2018-10-18: qty 4

## 2018-10-18 MED ORDER — PROTAMINE SULFATE 10 MG/ML IV SOLN
INTRAVENOUS | Status: DC | PRN
  Administered 2018-10-18: 40 mg via INTRAVENOUS
  Administered 2018-10-18: 20 mg via INTRAVENOUS

## 2018-10-18 MED ORDER — PHENYLEPHRINE HCL 10 MG/ML IJ SOLN
INTRAMUSCULAR | Status: DC | PRN
  Administered 2018-10-18: 40 ug via INTRAVENOUS

## 2018-10-18 MED ORDER — LIDOCAINE HCL (PF) 1 % IJ SOLN
INTRAMUSCULAR | Status: DC | PRN
  Administered 2018-10-18: 20 mL

## 2018-10-18 MED ORDER — FENTANYL CITRATE (PF) 250 MCG/5ML IJ SOLN
INTRAMUSCULAR | Status: AC
  Filled 2018-10-18: qty 5

## 2018-10-18 MED ORDER — 0.9 % SODIUM CHLORIDE (POUR BTL) OPTIME
TOPICAL | Status: DC | PRN
  Administered 2018-10-18: 1000 mL

## 2018-10-18 MED ORDER — VANCOMYCIN HCL IN DEXTROSE 1-5 GM/200ML-% IV SOLN
1000.0000 mg | INTRAVENOUS | Status: AC
  Administered 2018-10-18: 1000 mg via INTRAVENOUS
  Filled 2018-10-18: qty 200

## 2018-10-18 MED ORDER — OXYCODONE HCL 5 MG PO TABS
ORAL_TABLET | ORAL | Status: AC
  Filled 2018-10-18: qty 1

## 2018-10-18 MED ORDER — SODIUM CHLORIDE 0.9 % IV SOLN
INTRAVENOUS | Status: DC
  Administered 2018-10-18: 07:00:00 via INTRAVENOUS

## 2018-10-18 MED ORDER — OXYCODONE HCL 5 MG PO TABS: 5.0000 mg | ORAL_TABLET | Freq: Four times a day (QID) | ORAL | 0 refills | Status: DC | PRN

## 2018-10-18 MED ORDER — LIDOCAINE 2% (20 MG/ML) 5 ML SYRINGE
INTRAMUSCULAR | Status: AC
  Filled 2018-10-18: qty 10

## 2018-10-18 MED ORDER — LIDOCAINE HCL (CARDIAC) PF 100 MG/5ML IV SOSY
PREFILLED_SYRINGE | INTRAVENOUS | Status: DC | PRN
  Administered 2018-10-18: 60 mg via INTRAVENOUS

## 2018-10-18 MED ORDER — MIDAZOLAM HCL 2 MG/2ML IJ SOLN
INTRAMUSCULAR | Status: AC
  Filled 2018-10-18: qty 2

## 2018-10-18 MED ORDER — OXYCODONE HCL 5 MG PO TABS
5.0000 mg | ORAL_TABLET | Freq: Once | ORAL | Status: AC
  Administered 2018-10-18: 5 mg via ORAL

## 2018-10-18 MED ORDER — HEPARIN SODIUM (PORCINE) 1000 UNIT/ML IJ SOLN
INTRAMUSCULAR | Status: DC | PRN
  Administered 2018-10-18: 9000 [IU] via INTRAVENOUS

## 2018-10-18 MED ORDER — PROPOFOL 10 MG/ML IV BOLUS
INTRAVENOUS | Status: AC
  Filled 2018-10-18: qty 20

## 2018-10-18 SURGICAL SUPPLY — 30 items
ARMBAND PINK RESTRICT EXTREMIT (MISCELLANEOUS) ×4 IMPLANT
CANISTER SUCT 3000ML PPV (MISCELLANEOUS) ×2 IMPLANT
CANNULA VESSEL 3MM 2 BLNT TIP (CANNULA) ×2 IMPLANT
CLIP VESOCCLUDE MED 6/CT (CLIP) ×2 IMPLANT
CLIP VESOCCLUDE SM WIDE 6/CT (CLIP) ×2 IMPLANT
COVER PROBE W GEL 5X96 (DRAPES) IMPLANT
COVER WAND RF STERILE (DRAPES) ×2 IMPLANT
DECANTER SPIKE VIAL GLASS SM (MISCELLANEOUS) ×2 IMPLANT
DERMABOND ADVANCED (GAUZE/BANDAGES/DRESSINGS) ×1
DERMABOND ADVANCED .7 DNX12 (GAUZE/BANDAGES/DRESSINGS) ×1 IMPLANT
ELECT REM PT RETURN 9FT ADLT (ELECTROSURGICAL) ×2
ELECTRODE REM PT RTRN 9FT ADLT (ELECTROSURGICAL) ×1 IMPLANT
GLOVE BIO SURGEON STRL SZ7.5 (GLOVE) ×2 IMPLANT
GLOVE BIOGEL PI IND STRL 8 (GLOVE) ×1 IMPLANT
GLOVE BIOGEL PI INDICATOR 8 (GLOVE) ×1
GOWN STRL REUS W/ TWL LRG LVL3 (GOWN DISPOSABLE) ×3 IMPLANT
GOWN STRL REUS W/TWL LRG LVL3 (GOWN DISPOSABLE) ×3
KIT BASIN OR (CUSTOM PROCEDURE TRAY) ×2 IMPLANT
KIT TURNOVER KIT B (KITS) ×2 IMPLANT
NS IRRIG 1000ML POUR BTL (IV SOLUTION) ×2 IMPLANT
PACK CV ACCESS (CUSTOM PROCEDURE TRAY) ×2 IMPLANT
PAD ARMBOARD 7.5X6 YLW CONV (MISCELLANEOUS) ×4 IMPLANT
SPONGE SURGIFOAM ABS GEL 100 (HEMOSTASIS) IMPLANT
SUT PROLENE 6 0 BV (SUTURE) ×4 IMPLANT
SUT VIC AB 3-0 SH 27 (SUTURE) ×2
SUT VIC AB 3-0 SH 27X BRD (SUTURE) ×2 IMPLANT
SUT VICRYL 4-0 PS2 18IN ABS (SUTURE) ×4 IMPLANT
TOWEL GREEN STERILE (TOWEL DISPOSABLE) ×2 IMPLANT
UNDERPAD 30X30 (UNDERPADS AND DIAPERS) ×2 IMPLANT
WATER STERILE IRR 1000ML POUR (IV SOLUTION) ×2 IMPLANT

## 2018-10-18 NOTE — Anesthesia Procedure Notes (Signed)
Procedure Name: MAC Date/Time: 10/18/2018 7:35 AM Performed by: Glynda Jaeger, CRNA Pre-anesthesia Checklist: Patient identified, Emergency Drugs available, Suction available, Timeout performed and Patient being monitored Patient Re-evaluated:Patient Re-evaluated prior to induction Oxygen Delivery Method: Simple face mask Placement Confirmation: positive ETCO2

## 2018-10-18 NOTE — Op Note (Signed)
    NAME: APOLLOS TENBRINK    MRN: [id number] DOB: Aug 28, 1951    DATE OF OPERATION: 10/18/2018  PREOP DIAGNOSIS:    Stage IV chronic kidney disease  POSTOP DIAGNOSIS:    Same  PROCEDURE:    Right brachiocephalic AV fistula  SURGEON: Judeth Cornfield. Scot Dock, MD, FACS  ASSIST: Leontine Locket, PA  ANESTHESIA: Local with sedation  EBL: Minimal  INDICATIONS:    John [name] is a 67 y.o. male who presents for new access.  He is not yet on dialysis.  FINDINGS:   4 mm upper arm cephalic vein  TECHNIQUE:   The patient was taken to the operating room and sedated by anesthesia.  The right upper extremity was prepped and draped in usual sterile fashion.  The vein was lateral with respect to the artery and therefore I elected to make separate incisions over the artery and over the vein.  After the skin was anesthetized a longitudinal incision was made over the brachial artery.  The brachial artery was dissected free beneath the fascia.  This was a good sized artery with no significant plaque.  Separate longitudinal incision was made of the cephalic vein after the skin was anesthetized.  The vein was mobilized with branches divided between clips and 3-0 silk ties.  In order to provide adequate length for mobilization over to the brachial artery, I made a separate small incision below the antecubital space in order to get more vein.  The vein was ligated distally.  It was gently distended with heparinized saline and the patient was heparinized.  A tunnel was created over for anastomosis to the brachial artery.  The brachial artery was clamped proximally and distally and a longitudinal arteriotomy was made.  The vein was sewn into side to the artery using continuous 6-0 Prolene suture.  At the completion there was an excellent thrill in the fistula.  There was radial and ulnar signal with the Doppler.  Hemostasis was obtained the wounds.  The heparin was partially reversed with protamine.   The wounds were closed with a deep layer of 3-0 Vicryl the skin closed with 4-0 Vicryl.  Dermabond was applied.  Patient tolerated the procedure well was transferred to the recovery room in stable condition.  All needle and sponge counts were correct.  Deitra Mayo, MD, FACS [facility]  DATE OF DICTATION:   10/18/2018

## 2018-10-18 NOTE — Interval H&P Note (Signed)
History and Physical Interval Note:  10/18/2018 7:09 AM  John [name]  has presented today for surgery, with the diagnosis of chronic kidney disease  The various methods of treatment have been discussed with the patient and family. After consideration of risks, benefits and other options for treatment, the patient has consented to  Procedure(s): ARTERIOVENOUS (AV) FISTULA CREATION VERSUS BASILIC VEIN TRANSPOSITION (Right) as a surgical intervention .  The patient's history has been reviewed, patient examined, no change in status, stable for surgery.  I have reviewed the patient's chart and labs.  Questions were answered to the patient's satisfaction.     Deitra Mayo

## 2018-10-18 NOTE — Transfer of Care (Signed)
Immediate Anesthesia Transfer of Care Note  Patient: John [name]  Procedure(s) Performed: BRACHIO-CEPHALIC ARTERIOVENOUS (AV) FISTULA CREATION (Right Arm Upper)  Patient Location: PACU  Anesthesia Type:MAC  Level of Consciousness: awake, alert , oriented, patient cooperative and responds to stimulation  Airway & Oxygen Therapy: Patient Spontanous Breathing and Patient connected to face mask oxygen  Post-op Assessment: Report given to RN, Post -op Vital signs reviewed and stable and Patient moving all extremities X 4  Post vital signs: Reviewed and stable  Last Vitals:  Vitals Value Taken Time  BP    Temp    Pulse    Resp    SpO2      Last Pain:  Vitals:   10/18/18 0613  TempSrc: Oral  PainSc:          Complications: No apparent anesthesia complications

## 2018-10-18 NOTE — Anesthesia Postprocedure Evaluation (Signed)
Anesthesia Post Note  Patient: John [name]  Procedure(s) Performed: BRACHIO-CEPHALIC ARTERIOVENOUS (AV) FISTULA CREATION (Right Arm Upper)     Patient location during evaluation: PACU Anesthesia Type: MAC Level of consciousness: awake Pain management: pain level controlled Vital Signs Assessment: post-procedure vital signs reviewed and stable Respiratory status: spontaneous breathing Cardiovascular status: stable Postop Assessment: no apparent nausea or vomiting Anesthetic complications: no    Last Vitals:  Vitals:   10/18/18 0924 10/18/18 0947  BP: 126/78 134/77  Pulse: 71 69  Resp: 12 18  Temp: (!) 36.4 C   SpO2: 98% 98%    Last Pain:  Vitals:   10/18/18 0947  TempSrc:   PainSc: 2                  Birdell Frasier

## 2018-10-18 NOTE — Anesthesia Preprocedure Evaluation (Signed)
Anesthesia Evaluation  Patient identified by MRN, date of birth, ID band Patient awake    Reviewed: Allergy & Precautions, H&P , NPO status , Patient's Chart, lab work & pertinent test results, reviewed documented beta blocker date and time   Airway Mallampati: II  TM Distance: >3 FB Neck ROM: Full    Dental no notable dental hx. (+) Teeth Intact, Dental Advisory Given   Pulmonary neg pulmonary ROS, former smoker,    Pulmonary exam normal breath sounds clear to auscultation       Cardiovascular hypertension, Pt. on medications and Pt. on home beta blockers + CAD and + Cardiac Stents   Rhythm:Regular Rate:Normal     Neuro/Psych negative neurological ROS  negative psych ROS   GI/Hepatic negative GI ROS, Neg liver ROS,   Endo/Other  diabetes, Insulin Dependent, Oral Hypoglycemic Agents  Renal/GU CRFRenal disease  negative genitourinary   Musculoskeletal   Abdominal   Peds  Hematology negative hematology ROS (+)   Anesthesia Other Findings   Reproductive/Obstetrics negative OB ROS                             Anesthesia Physical Anesthesia Plan  ASA: III  Anesthesia Plan: MAC   Post-op Pain Management:    Induction: Intravenous  PONV Risk Score and Plan: 2 and Propofol infusion and Ondansetron  Airway Management Planned: Simple Face Mask  Additional Equipment:   Intra-op Plan:   Post-operative Plan:   Informed Consent: I have reviewed the patients History and Physical, chart, labs and discussed the procedure including the risks, benefits and alternatives for the proposed anesthesia with the patient or authorized representative who has indicated his/her understanding and acceptance.   Dental advisory given  Plan Discussed with: CRNA  Anesthesia Plan Comments:         Anesthesia Quick Evaluation

## 2018-10-18 NOTE — Discharge Instructions (Signed)
° °  [facility]  Discharge Instructions  AV Fistula or Graft Surgery for Dialysis Access  Please refer to the following instructions for your post-procedure care. Your surgeon or physician assistant will discuss any changes with you.  Activity  You may drive the day following your surgery, if you are comfortable and no longer taking prescription pain medication. Resume full activity as the soreness in your incision resolves.  Bathing/Showering  You may shower after you go home. Keep your incision dry for 48 hours. Do not soak in a bathtub, hot tub, or swim until the incision heals completely. You may not shower if you have a hemodialysis catheter.  Incision Care  Clean your incision with mild soap and water after 48 hours. Pat the area dry with a clean towel. You do not need a bandage unless otherwise instructed. Do not apply any ointments or creams to your incision. You may have skin glue on your incision. Do not peel it off. It will come off on its own in about one week. Your arm may swell a bit after surgery. To reduce swelling use pillows to elevate your arm so it is above your heart. Your doctor will tell you if you need to lightly wrap your arm with an ACE bandage.  Diet  Resume your normal diet. There are not special food restrictions following this procedure. In order to heal from your surgery, it is CRITICAL to get adequate nutrition. Your body requires vitamins, minerals, and protein. Vegetables are the best source of vitamins and minerals. Vegetables also provide the perfect balance of protein. Processed food has little nutritional value, so try to avoid this.  Medications  Resume taking all of your medications. If your incision is causing pain, you may take over-the counter pain relievers such as acetaminophen (Tylenol). If you were prescribed a stronger pain medication, please be aware these medications can cause nausea and constipation. Prevent  nausea by taking the medication with a snack or meal. Avoid constipation by drinking plenty of fluids and eating foods with high amount of fiber, such as fruits, vegetables, and grains.  Do not take Tylenol if you are taking prescription pain medications.  Follow up Your surgeon may want to see you in the office following your access surgery. If so, this will be arranged at the time of your surgery.  Please call us immediately for any of the following conditions:  Increased pain, redness, drainage (pus) from your incision site Fever of 101 degrees or higher Severe or worsening pain at your incision site Hand pain or numbness.  Reduce your risk of vascular disease:  Stop smoking. If you would like help, call [facility] at 1-800-QUIT-NOW 980 595 1413) or [place] at [place] your cholesterol Maintain a desired weight Control your diabetes Keep your blood pressure down  Dialysis  It will take several weeks to several months for your new dialysis access to be ready for use. Your surgeon will determine when it is okay to use it. Your nephrologist will continue to direct your dialysis. You can continue to use your Permcath until your new access is ready for use.   10/18/2018 John [name] [id number] [birth year]  Surgeon(s): Angelia Mould, MD  Procedure(s): RIGHT BRACHIO-CEPHALIC ARTERIOVENOUS (AV) FISTULA CREATION  x Do not stick fistula for 12 weeks    If you have any questions, please call the office at [phone].

## 2018-10-19 ENCOUNTER — Telehealth: Payer: Self-pay | Admitting: Vascular Surgery

## 2018-10-19 ENCOUNTER — Encounter (HOSPITAL_COMMUNITY): Payer: Self-pay | Admitting: Vascular Surgery

## 2018-10-19 NOTE — Telephone Encounter (Signed)
-----   Message from Mena Goes, RN sent at 10/18/2018 10:47 AM EST ----- Regarding: 4-6 weeks with duplex   ----- Message ----- From: Gabriel Earing, PA-C Sent: 10/18/2018   8:52 AM EST To: [facility] Admin Pool, [facility] Charge Pool  S/p right BC AVF 10/18/18.  F/u in [place] clinic on Dr. Nicole Cella clinic day with duplex in 4-6 weeks Thanks

## 2018-10-19 NOTE — Telephone Encounter (Signed)
sch appt spk to pt mld ltr 12/01/2018 1pm Dialysis duplex 2pm p/o PA

## 2018-11-10 ENCOUNTER — Other Ambulatory Visit: Payer: Self-pay

## 2018-11-10 DIAGNOSIS — N185 Chronic kidney disease, stage 5: Secondary | ICD-10-CM

## 2018-12-01 ENCOUNTER — Ambulatory Visit (INDEPENDENT_AMBULATORY_CARE_PROVIDER_SITE_OTHER): Payer: Self-pay | Admitting: Physician Assistant

## 2018-12-01 ENCOUNTER — Ambulatory Visit (HOSPITAL_COMMUNITY)
Admission: RE | Admit: 2018-12-01 | Discharge: 2018-12-01 | Disposition: A | Discharge status: Home / Self Care | Payer: Medicare Other | Source: Ambulatory Visit | Attending: Family | Admitting: Family

## 2018-12-01 ENCOUNTER — Other Ambulatory Visit: Payer: Self-pay

## 2018-12-01 DIAGNOSIS — N184 Chronic kidney disease, stage 4 (severe): Secondary | ICD-10-CM

## 2018-12-01 DIAGNOSIS — N185 Chronic kidney disease, stage 5: Secondary | ICD-10-CM

## 2018-12-01 HISTORY — DX: Chronic kidney disease, stage 4 (severe): N18.4

## 2018-12-01 NOTE — Progress Notes (Signed)
Postoperative Access Visit   History of Present Illness   John [name] is a 67 y.o. year old male who presents for postoperative follow-up for: right brachiocephalic arteriovenous fistula by Dr. Dickson (Date: 10/18/18).  The patient's wounds are  healed.  The patient denies steal symptoms.  The patient is able to complete their activities of daily living.  He is not yet on dialysis.  He has met with the [facility] [place] transplant service and is being worked up for kidney transplantation.    Physical Examination   Vitals:   12/01/18 1341  BP: (!) 151/77  Pulse: 79  Resp: 18  Temp: 98.1 F (36.7 C)  TempSrc: Oral  SpO2: 97%  Weight: 205 lb (93 kg)  Height: 6' 2" (1.88 m)   Body mass index is 26.32 kg/m.  right arm Incisions are healed, hand grip is 5/5, sensation in digits is intact, palpable thrill, bruit can be auscultated; palpable L radial pulse    Medical Decision Making   John [name] is a 67 y.o. year old male who presents s/p left brachiocephalic arteriovenous fistula   Patent fistula with palpable thrill; no signs or symptoms of steal syndrome  The fistula will be ready for use 01/10/19  He may follow up as needed   Matthew Eveland PA-C [facility] [place] Office: [phone]  

## 2019-03-24 ENCOUNTER — Inpatient Hospital Stay (HOSPITAL_COMMUNITY)
Admission: AD | Admit: 2019-03-24 | Discharge: 2019-04-04 | DRG: 234 | Disposition: A | Discharge status: Home / Self Care | Payer: Medicare Other | Source: Other Acute Inpatient Hospital | Attending: Thoracic Surgery (Cardiothoracic Vascular Surgery) | Admitting: Thoracic Surgery (Cardiothoracic Vascular Surgery)

## 2019-03-24 DIAGNOSIS — Z880 Allergy status to penicillin: Secondary | ICD-10-CM

## 2019-03-24 DIAGNOSIS — N2581 Secondary hyperparathyroidism of renal origin: Secondary | ICD-10-CM | POA: Diagnosis present

## 2019-03-24 DIAGNOSIS — Z01811 Encounter for preprocedural respiratory examination: Secondary | ICD-10-CM

## 2019-03-24 DIAGNOSIS — D6959 Other secondary thrombocytopenia: Secondary | ICD-10-CM | POA: Diagnosis present

## 2019-03-24 DIAGNOSIS — E785 Hyperlipidemia, unspecified: Secondary | ICD-10-CM | POA: Diagnosis present

## 2019-03-24 DIAGNOSIS — R079 Chest pain, unspecified: Secondary | ICD-10-CM

## 2019-03-24 DIAGNOSIS — I120 Hypertensive chronic kidney disease with stage 5 chronic kidney disease or end stage renal disease: Secondary | ICD-10-CM | POA: Diagnosis present

## 2019-03-24 DIAGNOSIS — N185 Chronic kidney disease, stage 5: Secondary | ICD-10-CM | POA: Diagnosis present

## 2019-03-24 DIAGNOSIS — Z01810 Encounter for preprocedural cardiovascular examination: Secondary | ICD-10-CM | POA: Diagnosis not present

## 2019-03-24 DIAGNOSIS — E1151 Type 2 diabetes mellitus with diabetic peripheral angiopathy without gangrene: Secondary | ICD-10-CM | POA: Diagnosis present

## 2019-03-24 DIAGNOSIS — J9811 Atelectasis: Secondary | ICD-10-CM | POA: Diagnosis not present

## 2019-03-24 DIAGNOSIS — I739 Peripheral vascular disease, unspecified: Secondary | ICD-10-CM | POA: Diagnosis not present

## 2019-03-24 DIAGNOSIS — Y831 Surgical operation with implant of artificial internal device as the cause of abnormal reaction of the patient, or of later complication, without mention of misadventure at the time of the procedure: Secondary | ICD-10-CM | POA: Diagnosis present

## 2019-03-24 DIAGNOSIS — E875 Hyperkalemia: Secondary | ICD-10-CM | POA: Diagnosis present

## 2019-03-24 DIAGNOSIS — E1121 Type 2 diabetes mellitus with diabetic nephropathy: Secondary | ICD-10-CM | POA: Diagnosis not present

## 2019-03-24 DIAGNOSIS — Z87891 Personal history of nicotine dependence: Secondary | ICD-10-CM

## 2019-03-24 DIAGNOSIS — I25110 Atherosclerotic heart disease of native coronary artery with unstable angina pectoris: Secondary | ICD-10-CM | POA: Diagnosis not present

## 2019-03-24 DIAGNOSIS — D62 Acute posthemorrhagic anemia: Secondary | ICD-10-CM | POA: Diagnosis present

## 2019-03-24 DIAGNOSIS — I25119 Atherosclerotic heart disease of native coronary artery with unspecified angina pectoris: Secondary | ICD-10-CM | POA: Diagnosis present

## 2019-03-24 DIAGNOSIS — N184 Chronic kidney disease, stage 4 (severe): Secondary | ICD-10-CM | POA: Diagnosis not present

## 2019-03-24 DIAGNOSIS — Z8619 Personal history of other infectious and parasitic diseases: Secondary | ICD-10-CM | POA: Diagnosis not present

## 2019-03-24 DIAGNOSIS — I10 Essential (primary) hypertension: Secondary | ICD-10-CM | POA: Diagnosis present

## 2019-03-24 DIAGNOSIS — I2510 Atherosclerotic heart disease of native coronary artery without angina pectoris: Secondary | ICD-10-CM | POA: Diagnosis not present

## 2019-03-24 DIAGNOSIS — E7800 Pure hypercholesterolemia, unspecified: Secondary | ICD-10-CM | POA: Diagnosis not present

## 2019-03-24 DIAGNOSIS — E1122 Type 2 diabetes mellitus with diabetic chronic kidney disease: Secondary | ICD-10-CM | POA: Diagnosis present

## 2019-03-24 DIAGNOSIS — I214 Non-ST elevation (NSTEMI) myocardial infarction: Secondary | ICD-10-CM

## 2019-03-24 DIAGNOSIS — T82855A Stenosis of coronary artery stent, initial encounter: Secondary | ICD-10-CM | POA: Diagnosis present

## 2019-03-24 DIAGNOSIS — Z7984 Long term (current) use of oral hypoglycemic drugs: Secondary | ICD-10-CM | POA: Diagnosis not present

## 2019-03-24 DIAGNOSIS — I200 Unstable angina: Secondary | ICD-10-CM | POA: Diagnosis not present

## 2019-03-24 DIAGNOSIS — I252 Old myocardial infarction: Secondary | ICD-10-CM

## 2019-03-24 DIAGNOSIS — E872 Acidosis: Secondary | ICD-10-CM | POA: Diagnosis present

## 2019-03-24 DIAGNOSIS — Z7982 Long term (current) use of aspirin: Secondary | ICD-10-CM

## 2019-03-24 DIAGNOSIS — E782 Mixed hyperlipidemia: Secondary | ICD-10-CM | POA: Diagnosis not present

## 2019-03-24 DIAGNOSIS — Z951 Presence of aortocoronary bypass graft: Secondary | ICD-10-CM

## 2019-03-24 DIAGNOSIS — E1129 Type 2 diabetes mellitus with other diabetic kidney complication: Secondary | ICD-10-CM | POA: Diagnosis present

## 2019-03-24 HISTORY — DX: Non-ST elevation (NSTEMI) myocardial infarction: I21.4

## 2019-03-24 HISTORY — DX: Chest pain, unspecified: R07.9

## 2019-03-24 HISTORY — DX: Chronic kidney disease, stage 5: N18.5

## 2019-03-24 LAB — CBC
HCT: 33.9 % — ABNORMAL LOW (ref 39.0–52.0)
Hemoglobin: 10.9 g/dL — ABNORMAL LOW (ref 13.0–17.0)
MCH: 30 pg (ref 26.0–34.0)
MCHC: 32.2 g/dL (ref 30.0–36.0)
MCV: 93.4 fL (ref 80.0–100.0)
Platelets: 142 10*3/uL — ABNORMAL LOW (ref 150–400)
RBC: 3.63 MIL/uL — ABNORMAL LOW (ref 4.22–5.81)
RDW: 13.2 % (ref 11.5–15.5)
WBC: 6.9 10*3/uL (ref 4.0–10.5)
nRBC: 0 % (ref 0.0–0.2)

## 2019-03-24 LAB — GLUCOSE, CAPILLARY
Glucose-Capillary: 150 mg/dL — ABNORMAL HIGH (ref 70–99)
Glucose-Capillary: 212 mg/dL — ABNORMAL HIGH (ref 70–99)
Glucose-Capillary: 114 mg/dL — ABNORMAL HIGH (ref 70–99)

## 2019-03-24 LAB — BASIC METABOLIC PANEL
Anion gap: 11 (ref 5–15)
Anion gap: 14 (ref 5–15)
BUN: 58 mg/dL — ABNORMAL HIGH (ref 8–23)
BUN: 63 mg/dL — ABNORMAL HIGH (ref 8–23)
CO2: 16 mmol/L — ABNORMAL LOW (ref 22–32)
CO2: 20 mmol/L — ABNORMAL LOW (ref 22–32)
Calcium: 9.3 mg/dL (ref 8.9–10.3)
Calcium: 9.6 mg/dL (ref 8.9–10.3)
Chloride: 113 mmol/L — ABNORMAL HIGH (ref 98–111)
Chloride: 108 mmol/L (ref 98–111)
Creatinine, Ser: 4.57 mg/dL — ABNORMAL HIGH (ref 0.61–1.24)
Creatinine, Ser: 4.71 mg/dL — ABNORMAL HIGH (ref 0.61–1.24)
GFR calc Af Amer: 15 mL/min — ABNORMAL LOW (ref >60)
GFR calc Af Amer: 14 mL/min — ABNORMAL LOW (ref >60)
GFR calc non Af Amer: 13 mL/min — ABNORMAL LOW (ref >60)
GFR calc non Af Amer: 12 mL/min — ABNORMAL LOW (ref >60)
Glucose, Bld: 199 mg/dL — ABNORMAL HIGH (ref 70–99)
Glucose, Bld: 134 mg/dL — ABNORMAL HIGH (ref 70–99)
Potassium: 5.7 mmol/L — ABNORMAL HIGH (ref 3.5–5.1)
Potassium: 4.8 mmol/L (ref 3.5–5.1)
Sodium: 140 mmol/L (ref 135–145)
Sodium: 142 mmol/L (ref 135–145)

## 2019-03-24 LAB — HEPARIN LEVEL (UNFRACTIONATED)
Heparin Unfractionated: <0.1 IU/mL — ABNORMAL LOW (ref 0.30–0.70)
Heparin Unfractionated: 0.22 IU/mL — ABNORMAL LOW (ref 0.30–0.70)

## 2019-03-24 LAB — TROPONIN I
Troponin I: 3.78 ng/mL (ref <0.03)
Troponin I: 4.51 ng/mL (ref <0.03)
Troponin I: 4.16 ng/mL (ref <0.03)

## 2019-03-24 MED ORDER — TAMSULOSIN HCL 0.4 MG PO CAPS
0.4000 mg | ORAL_CAPSULE | Freq: Every day | ORAL | Status: DC
  Administered 2019-03-24 – 2019-04-03 (×10): 0.4 mg via ORAL
  Filled 2019-03-24 (×11): qty 1

## 2019-03-24 MED ORDER — ASPIRIN EC 81 MG PO TBEC
81.0000 mg | DELAYED_RELEASE_TABLET | Freq: Every day | ORAL | Status: DC
  Administered 2019-03-24 – 2019-03-28 (×4): 81 mg via ORAL
  Filled 2019-03-24 (×4): qty 1

## 2019-03-24 MED ORDER — PATIROMER SORBITEX CALCIUM 8.4 G PO PACK
16.8000 g | PACK | Freq: Every day | ORAL | Status: DC
  Administered 2019-03-24: 18:00:00 16.8 g via ORAL
  Filled 2019-03-24 (×2): qty 2

## 2019-03-24 MED ORDER — ALUM & MAG HYDROXIDE-SIMETH 200-200-20 MG/5ML PO SUSP: 30.0000 mL | Freq: Four times a day (QID) | ORAL | Status: DC | PRN

## 2019-03-24 MED ORDER — SODIUM BICARBONATE 8.4 % IV SOLN
INTRAVENOUS | Status: DC
  Administered 2019-03-24 – 2019-03-27 (×4): via INTRAVENOUS
  Filled 2019-03-24 (×5): qty 150

## 2019-03-24 MED ORDER — MORPHINE SULFATE (PF) 2 MG/ML IV SOLN: 2.0000 mg | INTRAVENOUS | Status: DC | PRN

## 2019-03-24 MED ORDER — GABAPENTIN 100 MG PO CAPS
200.0000 mg | ORAL_CAPSULE | Freq: Every day | ORAL | Status: DC
  Administered 2019-03-24 – 2019-04-03 (×11): 200 mg via ORAL
  Filled 2019-03-24 (×11): qty 2

## 2019-03-24 MED ORDER — ACETAMINOPHEN 325 MG PO TABS: 650.0000 mg | ORAL_TABLET | ORAL | Status: DC | PRN

## 2019-03-24 MED ORDER — CALCITRIOL 0.25 MCG PO CAPS
0.5000 ug | ORAL_CAPSULE | Freq: Every day | ORAL | Status: DC
  Administered 2019-03-24 – 2019-04-04 (×11): 0.5 ug via ORAL
  Filled 2019-03-24 (×2): qty 1
  Filled 2019-03-24 (×2): qty 2
  Filled 2019-03-24: qty 1
  Filled 2019-03-24 (×4): qty 2
  Filled 2019-03-24 (×2): qty 1

## 2019-03-24 MED ORDER — ISOSORBIDE MONONITRATE ER 30 MG PO TB24
30.0000 mg | ORAL_TABLET | Freq: Every day | ORAL | Status: DC
  Administered 2019-03-24 – 2019-03-28 (×5): 30 mg via ORAL
  Filled 2019-03-24 (×5): qty 1

## 2019-03-24 MED ORDER — METOPROLOL SUCCINATE ER 50 MG PO TB24
50.0000 mg | ORAL_TABLET | Freq: Every day | ORAL | Status: DC
  Administered 2019-03-24 – 2019-03-28 (×5): 50 mg via ORAL
  Filled 2019-03-24 (×5): qty 1

## 2019-03-24 MED ORDER — ONDANSETRON HCL 4 MG/2ML IJ SOLN: 4.0000 mg | Freq: Four times a day (QID) | INTRAMUSCULAR | Status: DC | PRN

## 2019-03-24 MED ORDER — INSULIN ASPART 100 UNIT/ML ~~LOC~~ SOLN
0.0000 [IU] | Freq: Three times a day (TID) | SUBCUTANEOUS | Status: DC
  Administered 2019-03-25 – 2019-03-29 (×11): 1 [IU] via SUBCUTANEOUS

## 2019-03-24 MED ORDER — ZOLPIDEM TARTRATE 5 MG PO TABS
5.0000 mg | ORAL_TABLET | Freq: Every day | ORAL | Status: DC
  Administered 2019-03-24 – 2019-03-28 (×5): 5 mg via ORAL
  Filled 2019-03-24 (×5): qty 1

## 2019-03-24 MED ORDER — HEPARIN BOLUS VIA INFUSION
1000.0000 [IU] | Freq: Once | INTRAVENOUS | Status: AC
  Administered 2019-03-24: 21:00:00 1000 [IU] via INTRAVENOUS
  Filled 2019-03-24: qty 1000

## 2019-03-24 MED ORDER — HEPARIN (PORCINE) 25000 UT/250ML-% IV SOLN
1400.0000 [IU]/h | INTRAVENOUS | Status: DC
  Administered 2019-03-24: 09:00:00 600 [IU]/h via INTRAVENOUS
  Administered 2019-03-25 – 2019-03-26 (×3): 1400 [IU]/h via INTRAVENOUS
  Filled 2019-03-24 (×3): qty 250

## 2019-03-24 MED ORDER — ATORVASTATIN CALCIUM 40 MG PO TABS
40.0000 mg | ORAL_TABLET | Freq: Every day | ORAL | Status: DC
  Administered 2019-03-24 – 2019-04-03 (×10): 40 mg via ORAL
  Filled 2019-03-24 (×10): qty 1

## 2019-03-24 MED ORDER — POLYVINYL ALCOHOL 1.4 % OP SOLN
1.0000 [drp] | OPHTHALMIC | Status: DC | PRN
  Administered 2019-03-25 (×2): 1 [drp] via OPHTHALMIC
  Filled 2019-03-24: qty 15

## 2019-03-24 NOTE — H&P (Signed)
History and Physical    John [name]:[id number] DOB: [birth year] DOA: 03/24/2019  Referring MD/NP/PA: Ivor Costa, MD PCP: John Riches, NP  Patient coming from: [facility] transfer  Chief Complaint: Chest pain  I have personally briefly reviewed patient's old medical records in [place]   HPI: John [name] is a 68 y.o. male with medical history significant of HTN, HLD, DM type II, CAD s/p BM stent to RCA, MI, CKD stage IV(s/p AV fistula, but not started on dialysis), and PVD s/p aorto bifemoral bypass,; who presented with complaints of chest pain starting last night.  Symptoms actually have occurred off and on over the last 4 to 5 months, but were very severe last night.  Describes substernal chest pain that did not radiate and caused him to have associated shortness of breath.  Denies having any nausea, vomiting, diaphoresis, fever, cough, or recent sick contacts.  He reports that he is trying to get on the transplant list, and reports undergoing a cardiology evaluation sometime at the end of January.  Care everywhere records notes stress testing last possibly performed in 10/2018 that was noted to be negative with low sensitivity.  At home patient had taken 4 aspirin prior to going to the hospital.  Found to have positive troponin 0.05-->0.39.  No acute EKG change per ED physician.  IV heparin started.  COVID-19 negative.  Hemoglobin stable.  Hemodynamically stable.  Has worsening renal function, creatinine is up from 3.29 on 09/13/17-->4.6, BUN 56.  Potassium normal.  Given to sublingual nitroglycerin and subsequently nitroglycerin patch placed with resolution of chest pain symptoms.  Patient is accepted to telemetry bed as inpatient.  Will need to consult cardiology after patient arrived.  ED Course: As seen above.  Review of Systems  Constitutional: Negative for diaphoresis and fever.  HENT: Negative for congestion and nosebleeds.   Eyes: Negative for double  vision and photophobia.  Respiratory: Positive for shortness of breath. Negative for cough.   Cardiovascular: Positive for chest pain.  Gastrointestinal: Negative for abdominal pain, nausea and vomiting.  Genitourinary: Negative for dysuria and hematuria.  Musculoskeletal: Negative for falls and myalgias.  Neurological: Negative for focal weakness and loss of consciousness.  Psychiatric/Behavioral: Negative for memory loss and substance abuse.    Past Medical History:  Diagnosis Date  . Diabetes mellitus without complication ([place])   . Hypertension   . MI (myocardial infarction) ([place])   . Renal disorder     Past Surgical History:  Procedure Laterality Date  . ABDOMINAL AORTIC ANEURYSM REPAIR    . APPENDECTOMY    . AV FISTULA PLACEMENT Right 10/18/2018   Procedure: BRACHIO-CEPHALIC ARTERIOVENOUS (AV) FISTULA CREATION;  Surgeon: Angelia Mould, MD;  Location: [place];  Service: Vascular;  Laterality: Right;  . CARDIAC CATHETERIZATION    . CORONARY ANGIOPLASTY    . IR FLUORO GUIDE CV LINE RIGHT  09/13/2017  . IR REMOVAL TUN CV CATH W/O FL  10/22/2017  . IR US GUIDE VASC ACCESS RIGHT  09/13/2017  . NECK SURGERY    . SMALL INTESTINE SURGERY    . TEE WITHOUT CARDIOVERSION N/A 09/10/2017   Procedure: TRANSESOPHAGEAL ECHOCARDIOGRAM (TEE);  Surgeon: Skeet Latch, MD;  Location: [place];  Service: Cardiovascular;  Laterality: N/A;     reports that he has quit smoking. He has never used smokeless tobacco. He reports current alcohol use. He reports that he does not use drugs.  Allergies  Allergen Reactions  . Penicillins Rash and  Other (See Comments)    Has patient had a PCN reaction causing immediate rash, facial/tongue/throat swelling, SOB or lightheadedness with hypotension: Yes Has patient had a PCN reaction causing severe rash involving mucus membranes or skin necrosis: No Has patient had a PCN reaction that required hospitalization: No Has patient had a PCN  reaction occurring within the last 10 years: No If all of the above answers are "NO", then may proceed with Cephalosporin use.    Family History  Problem Relation Age of Onset  . Cancer Maternal Grandmother     Prior to Admission medications   Medication Sig Start Date End Date Taking? Authorizing Provider  acetaminophen (TYLENOL) 500 MG tablet Take 1,500 mg by mouth 2 (two) times daily as needed (pain).    [provider]  amLODipine (NORVASC) 5 MG tablet Take 5 mg by mouth daily. 08/21/17   [provider]  aspirin EC 81 MG tablet Take 81 mg by mouth daily.    [provider]  atorvastatin (LIPITOR) 40 MG tablet Take 40 mg by mouth daily.    [provider]  calcitRIOL (ROCALTROL) 0.5 MCG capsule Take 0.5 mcg by mouth daily. 08/16/17   [provider]  Dulaglutide (TRULICITY) 1.5 EH/2.0NO SOPN Inject 1.5 mg into the skin every Sunday.    [provider]  Febuxostat (ULORIC) 80 MG TABS Take 80 mg by mouth daily.    [provider]  gabapentin (NEURONTIN) 100 MG capsule Take 2 capsules (200 mg total) by mouth 3 (three) times daily. Patient taking differently: Take 200 mg by mouth at bedtime.  09/14/17   Oretha Milch D, MD  glipiZIDE (GLUCOTROL XL) 2.5 MG 24 hr tablet Take 2.5 mg by mouth 2 (two) times daily.    [provider]  linagliptin (TRADJENTA) 5 MG TABS tablet Take 5 mg by mouth daily.    [provider]  metoprolol succinate (TOPROL-XL) 50 MG 24 hr tablet Take 50 mg by mouth daily. 08/08/17   [provider]  OVER THE COUNTER MEDICATION Place 1 drop into both eyes 2 (two) times daily as needed (dry eyes). Hyper Tears eye drops    [provider]  oxyCODONE (ROXICODONE) 5 MG immediate release tablet Take 1 tablet (5 mg total) by mouth every 6 (six) hours as needed. 10/18/18   Rhyne, Hulen Shouts, PA-C  tamsulosin (FLOMAX) 0.4 MG CAPS capsule Take 0.4 mg by mouth daily before supper. 06/29/17    [provider]  zolpidem (AMBIEN) 5 MG tablet Take 10 mg by mouth at bedtime.    [provider]    Physical Exam:  Constitutional: Elderly appearing male in NAD, calm, comfortable Vitals:   03/24/19 0654  BP: 131/78  Pulse: 86  Resp: 18  Temp: (!) 97.5 F (36.4 C)  TempSrc: Oral  SpO2: 98%  Weight: 98.2 kg  Height: 6\' 2"  (1.88 m)   Eyes: PERRL, lids and conjunctivae normal ENMT: Mucous membranes are moist. Posterior pharynx clear of any exudate or lesions.  Neck: normal, supple, no masses, no thyromegaly Respiratory: clear to auscultation bilaterally, no wheezing, no crackles. Normal respiratory effort. No accessory muscle use.  Cardiovascular: Regular rate and rhythm, no murmurs / rubs / gallops. No extremity edema. 2+ pedal pulses. No carotid bruits.  Right upper extremity fistula present Abdomen: no tenderness, no masses palpated. No hepatosplenomegaly. Bowel sounds positive.  Musculoskeletal: no clubbing / cyanosis. No joint deformity upper and lower extremities. Good ROM, no contractures. Normal muscle tone.  Skin: no rashes, lesions, ulcers. No induration Neurologic: CN 2-12 grossly intact. Sensation intact, DTR normal. Strength 5/5 in all 4.  Psychiatric: Normal judgment and insight. Alert and oriented x 3. Normal mood.     Labs on Admission: I have personally reviewed following labs and imaging studies  CBC: No results for input(s): WBC, NEUTROABS, HGB, HCT, MCV, PLT in the last 168 hours. Basic Metabolic Panel: No results for input(s): NA, K, CL, CO2, GLUCOSE, BUN, CREATININE, CALCIUM, MG, PHOS in the last 168 hours. GFR: CrCl cannot be calculated (Patient's most recent lab result is older than the maximum 21 days allowed.). Liver Function Tests: No results for input(s): AST, ALT, ALKPHOS, BILITOT, PROT, ALBUMIN in the last 168 hours. No results for input(s): LIPASE, AMYLASE in the last 168 hours. No results for input(s): AMMONIA in the last  168 hours. Coagulation Profile: No results for input(s): INR, PROTIME in the last 168 hours. Cardiac Enzymes: No results for input(s): CKTOTAL, CKMB, CKMBINDEX, TROPONINI in the last 168 hours. BNP (last 3 results) No results for input(s): PROBNP in the last 8760 hours. HbA1C: No results for input(s): HGBA1C in the last 72 hours. CBG: No results for input(s): GLUCAP in the last 168 hours. Lipid Profile: No results for input(s): CHOL, HDL, LDLCALC, TRIG, CHOLHDL, LDLDIRECT in the last 72 hours. Thyroid Function Tests: No results for input(s): TSH, T4TOTAL, FREET4, T3FREE, THYROIDAB in the last 72 hours. Anemia Panel: No results for input(s): VITAMINB12, FOLATE, FERRITIN, TIBC, IRON, RETICCTPCT in the last 72 hours. Urine analysis:    Component Value Date/Time   COLORURINE STRAW (A) 09/06/2017 [place] 09/06/2017 1246   LABSPEC 1.010 09/06/2017 1246   PHURINE 6.0 09/06/2017 1246   GLUCOSEU >=500 (A) 09/06/2017 1246   HGBUR NEGATIVE 09/06/2017 [place] 09/06/2017 1246   KETONESUR NEGATIVE 09/06/2017 1246   PROTEINUR 100 (A) 09/06/2017 1246   NITRITE NEGATIVE 09/06/2017 1246   LEUKOCYTESUR NEGATIVE 09/06/2017 1246   Sepsis Labs: No results found for this or any previous visit (from the past 240 hour(s)).   Radiological Exams on Admission: No results found.  EKG: Independently reviewed.  Normal sinus rhythm   Assessment/Plan NSTEMI with chest pain, CAD: Patient with previous history of coronary artery disease status post RCA stent in 2004, and had a low risk nuclear stress test performed at [facility] in 10/2018.  Troponins trended up 0.05->0.39->3.78.  Patient on aspirin, statin, beta-blocker. -Admit to a telemetry bed  -Heart healthy diet -Trend cardiac troponins -Continue aspirin, beta-blocker, and statin -Continue heparin per pharmacy  -Check echocardiogram -Cardiology consulted, will follow-up for further  recommendation -Imdur 30 mg daily added per cardiology recommendation  Chronic kidney disease stage 5: Patient with end-stage renal disease creatinine noted to be 4.65 with BUN 56.  Creatinine previously documented around 3.28 in 08/2017.  Patient with AV fistula in place that is able to be used and still reports being able to make urine. -Continue Calcitrol -Check I&O's and daily weight -Nephrology consulted discussed with Dr. Joelyn Oms, will follow-up for further recommendation   Essential hypertension: Blood pressures elevated up to 153/95. -Continue metoprolol and amlodipine  Diabetes mellitus type 2: Last hemoglobin A1c 7.3 in 06/2018. -Hypoglycemic protocol -Check hemoglobin A1c in a.m. -Hold Trulicity, glipizide, and Tradjenta -CBGs q. before meals and at bedtime with sensitive SSI    Normocytic normochromic anemia: Hemoglobin 10.9.  Patient denies any reports of bleeding. -Continue to monitor  Hyperlipidemia -Check lipid panel in  a.m. -Continue atorvastatin  Peripheral vascular disease: Patient with history of aortobifemoral bypass graft in -Continue aspirin and statin  DVT prophylaxis: Heparin Code Status: Full Family Communication: No family present at bedside Disposition Plan: Possible discharge home in 2 to 3 days Consults called: Cardiology Dr.  Admission status: Observation  Norval Morton MD [facility] Pager [phone]   If 7PM-7AM, please contact night-coverage [email or web address] Password [email or web address]  03/24/2019, 7:59 AM

## 2019-03-24 NOTE — Progress Notes (Signed)
[place] for chest pain Indication: chest pain/ACS  Allergies  Allergen Reactions  . Penicillins Rash and Other (See Comments)    Has patient had a PCN reaction causing immediate rash, facial/tongue/throat swelling, SOB or lightheadedness with hypotension: Yes Has patient had a PCN reaction causing severe rash involving mucus membranes or skin necrosis: No Has patient had a PCN reaction that required hospitalization: No Has patient had a PCN reaction occurring within the last 10 years: No If all of the above answers are "NO", then may proceed with Cephalosporin use.    Patient Measurements: Height: 6\' 2"  (188 cm) Weight: 216 lb 6.4 oz (98.2 kg) IBW/kg (Calculated) : 82.2 Heparin Dosing Weight: 98.2 kg  Vital Signs: Temp: 98.7 F (37.1 C) (05/01 1118) Temp Source: Oral (05/01 1118) BP: 153/95 (05/01 1118) Pulse Rate: 90 (05/01 1118)  Labs: Recent Labs    03/24/19 0945  HGB 10.9*  HCT 33.9*  PLT 142*  HEPARINUNFRC <0.10*  CREATININE 4.57*  TROPONINI 3.78*    Estimated Creatinine Clearance: 18.7 mL/min (A) (by C-G formula based on SCr of 4.57 mg/dL (H)).   Medical History: Past Medical History:  Diagnosis Date  . Diabetes mellitus without complication ([place])   . Hypertension   . MI (myocardial infarction) ([place])   . Renal disorder     Medications:  Infusions:  . heparin 600 Units/hr (03/24/19 0850)    Assessment: 68 yo male transferred from [facility] with r/o ACS.  Heparin drip started there around 5 am at 600 units/hr.  No overt bleeding or complications noted.  Heparin level undetectable on current rate.  Troponin rising.  No overt bleeding or complications noted.  Goal of Therapy:  Heparin level 0.3-0.7 units/ml Monitor platelets by anticoagulation protocol: Yes   Plan:  Increase IV heparin to 1200 units/hr. Recheck heparin level in 8 hrs. Daily heparin level and CBC. F/u plans for cardiac  workup.  Marguerite Olea, [facility] Clinical Pharmacist Phone [phone]  03/24/2019 12:23 PM

## 2019-03-24 NOTE — Consult Note (Signed)
John [name] Admit Date: 03/24/2019 03/24/2019 John [name] Requesting Physician:  John Forest MD  Reason for Consult:  CKD5, Hyperkalemia, NSTEMI HPI:  68 year old male transferred to [facility] after presenting overnight to [facility] emergency room with a chief complaint of chest pain.  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 Johnney [name] at [facility].  He has a right brachiocephalic AV fistula.  Etiology of CKD is likely multifactorial and predominantly diabetic and hypertensive.  Patient was last seen in January of this year when his creatinine was 4.1, potassium 5.1, serum bicarbonate 20.  Patient's troponin was positive at [facility] and then upon arrival here is further elevated to 3.8.  EKG without ischemic changes.  Labs here are notable for potassium of 5.7.  Bicarbonate 16.  BUN is 58 and serum creatinine is 4.6.  Hemoglobin 10.9.  Patient currently chest pain-free.  No dyspnea.  No peripheral edema.  Is on a heparin drip.   Creatinine, Ser (mg/dL)  Date Value  03/24/2019 4.57 (H)  09/13/2017 3.28 (H)  09/12/2017 3.55 (H)  09/11/2017 4.08 (H)  09/10/2017 3.76 (H)  09/07/2017 4.15 (H)  09/06/2017 4.02 (H)  06/20/2009 2.74 (H)  06/18/2009 2.77 (H)  ] I/Os: I/O last 3 completed shifts: In: 0  Out: 500 [Urine:500]   ROS NSAIDS: Denies use IV Contrast no exposure TMP/SMX no exposure Hypotension not present Balance of 12 systems is negative w/ exceptions as above  PMH  Past Medical History:  Diagnosis Date  . Diabetes mellitus without complication ([place])   . Hypertension   .  MI (myocardial infarction) ([place])   . Renal disorder    PSH  Past Surgical History:  Procedure Laterality Date  . ABDOMINAL AORTIC ANEURYSM REPAIR    . APPENDECTOMY    . AV FISTULA PLACEMENT Right 10/18/2018   Procedure: BRACHIO-CEPHALIC ARTERIOVENOUS (AV) FISTULA CREATION;  Surgeon: Angelia Mould, MD;  Location: [facility];  Service: Vascular;  Laterality: Right;  . CARDIAC CATHETERIZATION    . CORONARY ANGIOPLASTY    . IR FLUORO GUIDE CV LINE RIGHT  09/13/2017  . IR REMOVAL TUN CV CATH W/O FL  10/22/2017  . IR US GUIDE VASC ACCESS RIGHT  09/13/2017  . NECK SURGERY    . SMALL INTESTINE SURGERY    . TEE WITHOUT CARDIOVERSION N/A 09/10/2017   Procedure: TRANSESOPHAGEAL ECHOCARDIOGRAM (TEE);  Surgeon: Skeet Latch, MD;  Location: [facility] ENDOSCOPY;  Service: Cardiovascular;  Laterality: N/A;   FH  Family History  Problem Relation Age of Onset  . Cancer Maternal Grandmother    SH  reports that he has quit smoking. He has never used smokeless tobacco. He reports current alcohol use. He reports that he does not use drugs. Allergies  Allergies  Allergen Reactions  . Penicillins Rash and Other (See Comments)    Has patient had a PCN reaction causing immediate rash, facial/tongue/throat swelling, SOB or lightheadedness with hypotension: Yes Has patient had a PCN reaction causing severe rash involving mucus membranes or skin necrosis: No Has patient had a PCN reaction that required hospitalization: No Has patient had a PCN reaction occurring within  the last 10 years: No If all of the above answers are "NO", then may proceed with Cephalosporin use.   Home medications Prior to Admission medications   Medication Sig Start Date End Date Taking? Authorizing Provider  acetaminophen (TYLENOL) 500 MG tablet Take 1,500 mg by mouth 2 (two) times daily as needed (pain).   Yes [provider]  amLODipine (NORVASC) 5 MG tablet Take 5 mg by mouth daily. 08/21/17  Yes [provider]  aspirin EC 81 MG tablet Take 81 mg by mouth daily.   Yes [provider]  atorvastatin (LIPITOR) 40 MG tablet Take 40 mg by mouth daily.   Yes [provider]  calcitRIOL (ROCALTROL) 0.5 MCG capsule Take 0.5 mcg by mouth daily. 08/16/17  Yes [provider]  Dulaglutide (TRULICITY) 1.5 NF/6.2ZH SOPN Inject 1.5 mg into the skin every Sunday.   Yes [provider]  gabapentin (NEURONTIN) 100 MG capsule Take 2 capsules (200 mg total) by mouth 3 (three) times daily. Patient taking differently: Take 200 mg by mouth at bedtime.  09/14/17  Yes Oretha Milch D, MD  glipiZIDE (GLUCOTROL XL) 2.5 MG 24 hr tablet Take 2.5 mg by mouth 2 (two) times daily.   Yes [provider]  linagliptin (TRADJENTA) 5 MG TABS tablet Take 5 mg by mouth daily.   Yes [provider]  metoprolol succinate (TOPROL-XL) 50 MG 24 hr tablet Take 50 mg by mouth daily. 08/08/17  Yes [provider]  OVER THE COUNTER MEDICATION Place 1 drop into both eyes 2 (two) times daily as needed (dry eyes). Hyper Tears eye drops   Yes [provider]  oxyCODONE (ROXICODONE) 5 MG immediate release tablet Take 1 tablet (5 mg total) by mouth every 6 (six) hours as needed. 10/18/18  Yes Rhyne, Hulen Shouts, PA-C  tamsulosin (FLOMAX) 0.4 MG CAPS capsule Take 0.4 mg by mouth daily before supper. 06/29/17  Yes [provider]  zolpidem (AMBIEN) 5 MG tablet Take 10 mg by mouth at bedtime.   Yes [provider]    Current Medications Scheduled Meds: . aspirin EC  81 mg Oral Daily  . atorvastatin  40 mg Oral q1800  . calcitRIOL  0.5 mcg Oral Daily  . gabapentin  200 mg Oral QHS  . insulin aspart  0-9 Units Subcutaneous TID WC  . metoprolol succinate  50 mg Oral Daily  . tamsulosin  0.4 mg Oral QAC supper  . zolpidem  5 mg Oral QHS   Continuous Infusions: . heparin 1,200 Units/hr (03/24/19 1229)   PRN Meds:.acetaminophen, alum & mag hydroxide-simeth, morphine  injection, ondansetron (ZOFRAN) IV, polyvinyl alcohol  CBC Recent Labs  Lab 03/24/19 0945  WBC 6.9  HGB 10.9*  HCT 33.9*  MCV 93.4  PLT 086*   Basic Metabolic Panel Recent Labs  Lab 03/24/19 0945  NA 140  K 5.7*  CL 113*  CO2 16*  GLUCOSE 199*  BUN 58*  CREATININE 4.57*  CALCIUM 9.3    Physical Exam  Blood pressure (!) 153/95, pulse 90, temperature 98.7 F (37.1 C), temperature source Oral, resp. rate 18, height 6\' 2"  (1.88 m), weight 98.2 kg, SpO2 98 %. GEN: Well-appearing, no distress, lying flat in the bed ENT: NCAT EYES: EOMI CV: Regular, normal S1 and S2, no murmur or rub PULM: Clear bilaterally, normal work of breathing ABD: Soft, nontender SKIN: No rashes or lesions EXT: No peripheral edema  Assessment 2M with CKD5, Hyperkalemia, presenting with NSTEMI  1. CKD5  2. Hyperkalemia 3. NSTEMI 4. NAG metabolic acidosis 5. PVD hx/o RAS and Ao-BiFem Bypass 6. 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 Mature RUE BC AVF 8. Mild anemia 9. DM2 10. HTN  Plan  Difficult situation, patient clearly having significant end STEMI with rising troponin but with CKD 5 and hyperkalemia.  Patient well aware of potential risk of acute or long-term dialysis dependence from contrast nephropathy.  16.8 g of [place] now for hyperkalemia  D5 150 mEq sodium bicarbonate drip at 50 mils an hour for hydration, treatment of #2 and #4; also, to mitigate potential contrast nephropathy  1900 BMP  AVF ready for use  Will follow closely  Daily weights, Daily Renal Panel, Strict I/Os, Avoid nephrotoxins (NSAIDs, judicious IV Contrast)      Pearson Grippe MD 03/24/2019, 2:15 PM

## 2019-03-24 NOTE — Progress Notes (Signed)
[place] for chest pain Indication: chest pain/ACS  Allergies  Allergen Reactions  . Penicillins Rash and Other (See Comments)    Has patient had a PCN reaction causing immediate rash, facial/tongue/throat swelling, SOB or lightheadedness with hypotension: Yes Has patient had a PCN reaction causing severe rash involving mucus membranes or skin necrosis: No Has patient had a PCN reaction that required hospitalization: No Has patient had a PCN reaction occurring within the last 10 years: No If all of the above answers are "NO", then may proceed with Cephalosporin use.    Patient Measurements: Height: 6\' 2"  (188 cm) Weight: 216 lb 6.4 oz (98.2 kg) IBW/kg (Calculated) : 82.2 Heparin Dosing Weight: 98.2 kg  Vital Signs: Temp: 98.7 F (37.1 C) (05/01 1118) Temp Source: Oral (05/01 1118) BP: 153/95 (05/01 1118) Pulse Rate: 90 (05/01 1118)  Labs: Recent Labs    03/24/19 0945 03/24/19 1600 03/24/19 1924  HGB 10.9*  --   --   HCT 33.9*  --   --   PLT 142*  --   --   HEPARINUNFRC <0.10*  --  0.22*  CREATININE 4.57*  --   --   TROPONINI 3.78* 4.51*  --     Estimated Creatinine Clearance: 18.7 mL/min (A) (by C-G formula based on SCr of 4.57 mg/dL (H)).   Medical History: Past Medical History:  Diagnosis Date  . Diabetes mellitus without complication ([place])   . Hypertension   . MI (myocardial infarction) ([place])   . Renal disorder     Medications:  Infusions:  . heparin 1,200 Units/hr (03/24/19 1229)  .  sodium bicarbonate  infusion 1000 mL 50 mL/hr at 03/24/19 1719    Assessment: 68 yo male transferred from [facility] with r/o ACS.  Heparin was increased to 1200 units/hr -heparin level= 0.22   Goal of Therapy:  Heparin level 0.3-0.7 units/ml Monitor platelets by anticoagulation protocol: Yes   Plan:   -heparin 1000 unit bolus then increase to 1400 units/hr -Heparin level in 6 hours and daily wth CBC daily  Hildred Laser, PharmD Clinical Pharmacist **Pharmacist phone directory can now be found on [email or web address] ([email or web address]).  Listed under [place].

## 2019-03-24 NOTE — Progress Notes (Signed)
Dr Tamala Julian notified of critical trop 3.78. awaiting any new orders.

## 2019-03-24 NOTE — Care Management (Signed)
This is a no charge note  Pending admission per Dr.   Nicki Guadalajara from [place] to [facility] tele per Dr. Lexine Baton, Male, 68 y.o., June 06, 1951, MRN: [id number]  68 year old man with past medical history of hypertension, hyperlipidemia, diabetes mellitus, CAD, myocardial infarction, CKD- 4 (had an AV fistula placed, not started dialysis yet), who presents with chest pain intermittently for several weeks.  Found to have positive troponin 0.05-->0.39.  No acute EKG change per ED physician.  IV heparin started.  COVID-19 negative.  Hemoglobin stable.  Hemodynamically stable.  Has worsening renal function, creatinine is up from 3.29 on 09/13/17-->4.6, BUN 56.  Potassium normal.  Patient is accepted to telemetry bed as inpatient.  Will need to consult cardiology after patient arrived.   Please call manager of [facility] at [phone] when pt arrives to floor   Ivor Costa, MD  [facility]   If 7PM-7AM, please contact night-coverage [email or web address] Password [email or web address] 03/24/2019, 4:25 AM

## 2019-03-24 NOTE — Progress Notes (Signed)
ANTICOAGULATION CONSULT NOTE - Initial Consult  Pharmacy Consult for chest pain Indication: chest pain/ACS  Allergies  Allergen Reactions  . Penicillins Rash and Other (See Comments)    Has patient had a PCN reaction causing immediate rash, facial/tongue/throat swelling, SOB or lightheadedness with hypotension: Yes Has patient had a PCN reaction causing severe rash involving mucus membranes or skin necrosis: No Has patient had a PCN reaction that required hospitalization: No Has patient had a PCN reaction occurring within the last 10 years: No If all of the above answers are "NO", then may proceed with Cephalosporin use.    Patient Measurements: Height: 6\' 2"  (188 cm) Weight: 216 lb 6.4 oz (98.2 kg) IBW/kg (Calculated) : 82.2 Heparin Dosing Weight: 98.2 kg  Vital Signs: Temp: 98 F (36.7 C) (05/01 0823) Temp Source: Oral (05/01 0823) BP: 147/86 (05/01 0823) Pulse Rate: 89 (05/01 0823)  Labs: No results for input(s): HGB, HCT, PLT, APTT, LABPROT, INR, HEPARINUNFRC, HEPRLOWMOCWT, CREATININE, CKTOTAL, CKMB, TROPONINI in the last 72 hours.  CrCl cannot be calculated (Patient's most recent lab result is older than the maximum 21 days allowed.).   Medical History: Past Medical History:  Diagnosis Date  . Diabetes mellitus without complication ([place])   . Hypertension   . MI (myocardial infarction) ([place])   . Renal disorder     Medications:  Infusions:  . heparin      Assessment: 68 yo male transferred from [facility] with r/o ACS.  Heparin drip started there around 5 am at 600 units/hr.  No overt bleeding or complications noted.  Goal of Therapy:  Heparin level 0.3-0.7 units/ml Monitor platelets by anticoagulation protocol: Yes   Plan:  Continue heparin at current rate. Check heparin level at 10 am and will adjust as needed. Daily heparin level and CBC. F/u plans for cardiac workup.  Marguerite Olea, [facility] Clinical Pharmacist Phone [phone]  03/24/2019 8:45 AM

## 2019-03-24 NOTE — Consult Note (Signed)
Cardiology Consultation:   Patient ID: John [name] MRN: [id number]; DOB: Apr 11, 1951  Admit date: 03/24/2019 Date of Consult: 03/24/2019  Primary Care Provider: Imagene Riches, NP Primary Cardiologist: [facility] Primary Electrophysiologist:  None    Patient Profile:   John [name] is a 68 y.o. male with a hx of CAD, PVD, IDDM Stage IV CKD who is being seen today for the evaluation of CP and elevated troponin at the request of Dr. Tamala [name], Internal Medicine.   History of Present Illness:   John [name]  is a 68 year old male with CAD, peripheral vascular disease, stage IV CKD, and diabetes, being seen today for further evaluation of chest pain with elevated troponin.  Patient has been followed by [facility] cardiology.  Per records obtained from [place], he had a heart attack in Dec 2004 and had cardiac cath. His right coronary artery was occluded and was treated with a single bare-metal stent. In December 2005, the patient had a repeat heart catheterization for symptoms and the stent was fine and there was no other significant coronary artery disease.  He had a nuclear stress test last fall at [facility] which was normal. He last saw Dr. Bishop [name] 12/21/18 for cardiac evaluation. At that time, he was fairly stable from a cardiac standpoint but did note occasional chest pain during emotional stress.  He denied any exertional chest pain.   He also has peripheral vascular disease. He has a history of an aortobifemoral bypass graft which was done about 10 years ago by Dr. Benjamine [name].  He has stage IV CKD and is being considered for renal transplant. He is not yet on HD, but he does have a fistula done by Dr. Scot [name]. He is followed by [facility].   He was in his usual state of health until yesterday evening.  He reports developing severe substernal chest discomfort around 7 PM yesterday.  He had eaten dinner around 5 PM which consisted of fried chicken with rice  and gravy.  He went to bed around 630 and was laying down when he developed the chest discomfort radiating up into his neck.  Felt like someone was sitting on his chest.  He was also short of breath.  It was hard to breathe.  He denies any associated diaphoresis, nausea or vomiting.  He tried taking Tylenol and aspirin at home as well as 1 of his wife's anxiety medications however the pain failed to improve, prompting him to go to [facility] for evaluation.  There he was given 2 sublingual nitroglycerin tablets which helped to alleviate the pain.  He denies any recurrence since taking nitro.  At [facility], he was found to have an elevated troponin. 0.05>>0.39.  EKG was nonischemic.  He was started on IV heparin.  He was reported to be COVID-19 negative.  Creatinine was noted to be elevated, up from previous level of 3.29 to now 4.6, BUN 56.  Potassium was normal.  Patient was transferred to [facility] for further evaluation.  He is currently chest pain-free.  Troponin here at [facility] was 3.78.    Past Medical History:  Diagnosis Date   Diabetes mellitus without complication (John [name])    Hypertension    MI (myocardial infarction) ([place])    Renal disorder     Past Surgical History:  Procedure Laterality Date   ABDOMINAL AORTIC ANEURYSM REPAIR     APPENDECTOMY     AV FISTULA PLACEMENT Right 10/18/2018  Procedure: BRACHIO-CEPHALIC ARTERIOVENOUS (AV) FISTULA CREATION;  Surgeon: John Mould, MD;  Location: [facility] OR;  Service: Vascular;  Laterality: Right;   CARDIAC CATHETERIZATION     CORONARY ANGIOPLASTY     IR FLUORO GUIDE CV LINE RIGHT  09/13/2017   IR REMOVAL TUN CV CATH W/O FL  10/22/2017   IR US GUIDE VASC ACCESS RIGHT  09/13/2017   NECK SURGERY     SMALL INTESTINE SURGERY     TEE WITHOUT CARDIOVERSION N/A 09/10/2017   Procedure: TRANSESOPHAGEAL ECHOCARDIOGRAM (TEE);  Surgeon: John Latch, MD;  Location: [place];  Service:  Cardiovascular;  Laterality: N/A;     Home Medications:  Prior to Admission medications   Medication Sig Start Date End Date Taking? Authorizing Provider  acetaminophen (TYLENOL) 500 MG tablet Take 1,500 mg by mouth 2 (two) times daily as needed (pain).   Yes [provider]  amLODipine (NORVASC) 5 MG tablet Take 5 mg by mouth daily. 08/21/17  Yes [provider]  aspirin EC 81 MG tablet Take 81 mg by mouth daily.   Yes [provider]  atorvastatin (LIPITOR) 40 MG tablet Take 40 mg by mouth daily.   Yes [provider]  calcitRIOL (ROCALTROL) 0.5 MCG capsule Take 0.5 mcg by mouth daily. 08/16/17  Yes [provider]  Dulaglutide (TRULICITY) 1.5 JT/7.0VX SOPN Inject 1.5 mg into the skin every Sunday.   Yes [provider]  gabapentin (NEURONTIN) 100 MG capsule Take 2 capsules (200 mg total) by mouth 3 (three) times daily. Patient taking differently: Take 200 mg by mouth at bedtime.  09/14/17  Yes John Milch D, MD  glipiZIDE (GLUCOTROL XL) 2.5 MG 24 hr tablet Take 2.5 mg by mouth 2 (two) times daily.   Yes [provider]  linagliptin (TRADJENTA) 5 MG TABS tablet Take 5 mg by mouth daily.   Yes [provider]  metoprolol succinate (TOPROL-XL) 50 MG 24 hr tablet Take 50 mg by mouth daily. 08/08/17  Yes [provider]  OVER THE COUNTER MEDICATION Place 1 drop into both eyes 2 (two) times daily as needed (dry eyes). Hyper Tears eye drops   Yes [provider]  oxyCODONE (ROXICODONE) 5 MG immediate release tablet Take 1 tablet (5 mg total) by mouth every 6 (six) hours as needed. 10/18/18  Yes Rhyne, Hulen Shouts, PA-C  tamsulosin (FLOMAX) 0.4 MG CAPS capsule Take 0.4 mg by mouth daily before supper. 06/29/17  Yes [provider]  zolpidem (AMBIEN) 5 MG tablet Take 10 mg by mouth at bedtime.   Yes [provider]    Inpatient Medications: Scheduled Meds:  aspirin EC  81 mg Oral Daily    atorvastatin  40 mg Oral q1800   calcitRIOL  0.5 mcg Oral Daily   gabapentin  200 mg Oral QHS   insulin aspart  0-9 Units Subcutaneous TID WC   metoprolol succinate  50 mg Oral Daily   tamsulosin  0.4 mg Oral QAC supper   zolpidem  10 mg Oral QHS   Continuous Infusions:  heparin 600 Units/hr (03/24/19 0850)   PRN Meds: acetaminophen, alum & mag hydroxide-simeth, morphine injection, ondansetron (ZOFRAN) IV, polyvinyl alcohol  Allergies:    Allergies  Allergen Reactions   Penicillins Rash and Other (See Comments)    Has patient had a PCN reaction causing immediate rash, facial/tongue/throat swelling, SOB or lightheadedness with hypotension: Yes Has patient had a PCN reaction causing severe rash involving mucus membranes or skin necrosis: No Has patient  had a PCN reaction that required hospitalization: No Has patient had a PCN reaction occurring within the last 10 years: No If all of the above answers are "NO", then may proceed with Cephalosporin use.    Social History:   Social History   Socioeconomic History   Marital status: Married    Spouse name: Not on file   Number of children: Not on file   Years of education: Not on file   Highest education level: Not on file  Occupational History   Not on file  Social Needs   Financial resource strain: Not on file   Food insecurity:    Worry: Not on file    Inability: Not on file   Transportation needs:    Medical: Not on file    Non-medical: Not on file  Tobacco Use   Smoking status: Former Smoker   Smokeless tobacco: Never Used  Substance and Sexual Activity   Alcohol use: Yes    Comment: Occasionally.   Drug use: Never   Sexual activity: Not on file  Lifestyle   Physical activity:    Days per week: Not on file    Minutes per session: Not on file   Stress: Not on file  Relationships   Social connections:    Talks on phone: Not on file    Gets together: Not on file    Attends religious  service: Not on file    Active member of club or organization: Not on file    Attends meetings of clubs or organizations: Not on file    Relationship status: Not on file   Intimate partner violence:    Fear of current or ex partner: Not on file    Emotionally abused: Not on file    Physically abused: Not on file    Forced sexual activity: Not on file  Other Topics Concern   Not on file  Social History Narrative   Not on file    Family History:    Family History  Problem Relation Age of Onset   Cancer Maternal Grandmother      ROS:  Please see the history of present illness.   All other ROS reviewed and negative.     Physical Exam/Data:   Vitals:   03/24/19 0654 03/24/19 0823  BP: 131/78 (!) 147/86  Pulse: 86 89  Resp: 18 17  Temp: (!) 97.5 F (36.4 C) 98 F (36.7 C)  TempSrc: Oral Oral  SpO2: 98% 98%  Weight: 98.2 kg   Height: 6\' 2"  (1.88 m)     Intake/Output Summary (Last 24 hours) at 03/24/2019 1036 Last data filed at 03/24/2019 0658 Gross per 24 hour  Intake 0 ml  Output 500 ml  Net -500 ml   Last 3 Weights 03/24/2019 12/01/2018 10/18/2018  Weight (lbs) 216 lb 6.4 oz 205 lb 205 lb 15 oz  Weight (kg) 98.158 kg 92.987 kg 93.413 kg     Body mass index is 27.78 kg/m.  General:  Well nourished, well developed, in no acute distress HEENT: normal Lymph: no adenopathy Neck: no JVD Endocrine:  No thryomegaly Vascular: No carotid bruits; FA pulses 2+ bilaterally. Right arm AV fistula noted. Cardiac:  normal S1, S2; RRR; no murmur  Lungs:  clear to auscultation bilaterally, no wheezing, rhonchi or rales  Abd: soft, nontender, no hepatomegaly, midline abdominal scar Ext: no edema Musculoskeletal:  No deformities, BUE and BLE strength normal and equal Skin: warm and dry  Neuro:  CNs 2-12  intact, no focal abnormalities noted Psych:  Normal affect   EKG:  The EKG was personally reviewed and demonstrates:  NSR 87 bpm, within normal limits  Telemetry:  Telemetry  was personally reviewed and demonstrates:  Normal sinus rhythm  Relevant CV Studies: 2D Echo 2018 Study Conclusions  - Left ventricle: Systolic function was normal. The estimated   ejection fraction was in the range of 55% to 60%. Wall motion was   normal; there were no regional wall motion abnormalities. - Aortic valve: There was a 1.0 cm (L) x 0.2 cm (W), strand-like   echodensity intermittently seen in the LVOT. It is unclear if it   is attached to the aortic valve. - Mitral valve: There was a very small 0.6 cm (L) x 0.2 cm (W)   filamentous hypodensity intermittently seen on the atrial side of   the anterior leaflet. - Left atrium: No evidence of thrombus in the atrial cavity or   appendage. No evidence of thrombus in the atrial cavity or   appendage. - Right atrium: No evidence of thrombus in the atrial cavity or   appendage. - Atrial septum: No defect or patent foramen ovale was identified   by color flow Doppler.  Impressions:  - Very small, strand-like echodensities were noted on the mitral   and aortic valves. Cannot rule out early endocarditis. No clear   vegetations were noted.  Laboratory Data:  ChemistryNo results for input(s): NA, K, CL, CO2, GLUCOSE, BUN, CREATININE, CALCIUM, GFRNONAA, GFRAA, ANIONGAP in the last 168 hours.  No results for input(s): PROT, ALBUMIN, AST, ALT, ALKPHOS, BILITOT in the last 168 hours. HematologyNo results for input(s): WBC, RBC, HGB, HCT, MCV, MCH, MCHC, RDW, PLT in the last 168 hours. Cardiac EnzymesNo results for input(s): TROPONINI in the last 168 hours. No results for input(s): TROPIPOC in the last 168 hours.  BNPNo results for input(s): BNP, PROBNP in the last 168 hours.  DDimer No results for input(s): DDIMER in the last 168 hours.  Radiology/Studies:  No results found.  Assessment and Plan:   1.  NSTEMI/CAD: Troponin 0.05>>0.39>>3.78.  He is currently chest pain-free.  Continue IV heparin.  Per records, he had a  myocardial infarction in 2004 with RCA PCI and had a low risk stress test in the fall 2019.  Difficult situation given the patient's concomitant stage IV CKD.  He reports he is being considered for possible renal transplant.  He is not yet on hemodialysis but has a functioning fistula.  Cardiac catheterization would most definitely worsen renal function, necessitating HD.  MD to discuss options with patient regarding catheterization versus medical management. He is currently on ASA, statin and  blocker. There is room in BP to add LA Nitrate. Check 2D echo.   2.  Stage IV CKD: Not yet on hemodialysis.  Per patient, he is being considered for possible renal transplant.  Followed by [facility].  Creatinine in the 4.57. 3.28 in 2008.  Potassium 5.7. Nephrology consulted, per H&P.   3.  Diabetes: Management per internal medicine.  4.  Peripheral vascular disease: He has a history of an aortobifemoral bypass graft which was done about 10 years ago by Dr. Benjamine [name]. Continue ASA and statin.   For questions or updates, please contact [place] Please consult [email or web address] for contact info under   Signed, Lyda Jester, PA-C  03/24/2019 10:36 AM   Patient seen, examined. Available data reviewed. Agree with findings, assessment, and plan as outlined by Lyda Jester, PA-C. The  exam findings above reflect my personal findings from today's exam. The patient is now chest pain free on IV heparin after receiving sublingual NTG. All available data is reviewed. He has had increasing exertional angina, now with resting angina yesterday and elevated troponin, classic presentation of NSTEMI-ACS. The patient has advanced kidney disease with a functioning right arm AV fistula. I have discussed his case with Dr Joelyn Oms and have reviewed the relative risks of cardiac cath and possible PTCA/stenting with the patient and his wife (telephone conversation with wife). Cath/PCI is clearly indicated and the patient  understands the high risk of requiring hemodialysis. I have reviewed the risks, indications, and alternatives to cardiac catheterization, possible angioplasty, and stenting with the patient. Risks include but are not limited to bleeding, infection, vascular injury, stroke, myocardial infection, arrhythmia, kidney injury, radiation-related injury in the case of prolonged fluoroscopy use, emergency cardiac surgery, and death. The patient understands the risks of serious complication is 1-2 in 5834 with diagnostic cardiac cath and 1-2% or less with angioplasty/stenting. Again, he understands the risk of kidney failure is very high in the setting of his advanced kidney disease and he is prepared to go onto dialysis if needed. Unfortunately he just finished eating a large meal so catheterization cannot be done this afternoon. Will post for Monday morning. If he develops refractory symptoms over the weekend he will be done emergently, but at present he is chest pain free and comfortable. Cath should be done via femoral access to preserve vascular access in the arms. Will continue beta blocker, high intensity statin, ASA, heparin. Add low dose imdur.   Sherren Mocha, M.[name]. 03/24/2019 4:14 PM

## 2019-03-25 ENCOUNTER — Inpatient Hospital Stay (HOSPITAL_COMMUNITY): Payer: Medicare Other

## 2019-03-25 DIAGNOSIS — I25110 Atherosclerotic heart disease of native coronary artery with unstable angina pectoris: Secondary | ICD-10-CM

## 2019-03-25 DIAGNOSIS — R079 Chest pain, unspecified: Secondary | ICD-10-CM

## 2019-03-25 DIAGNOSIS — N185 Chronic kidney disease, stage 5: Secondary | ICD-10-CM

## 2019-03-25 DIAGNOSIS — E7800 Pure hypercholesterolemia, unspecified: Secondary | ICD-10-CM

## 2019-03-25 DIAGNOSIS — I10 Essential (primary) hypertension: Secondary | ICD-10-CM

## 2019-03-25 LAB — CBC
HCT: 31.2 % — ABNORMAL LOW (ref 39.0–52.0)
Hemoglobin: 10.1 g/dL — ABNORMAL LOW (ref 13.0–17.0)
MCH: 29.8 pg (ref 26.0–34.0)
MCHC: 32.4 g/dL (ref 30.0–36.0)
MCV: 92 fL (ref 80.0–100.0)
Platelets: 157 10*3/uL (ref 150–400)
RBC: 3.39 MIL/uL — ABNORMAL LOW (ref 4.22–5.81)
RDW: 13.2 % (ref 11.5–15.5)
WBC: 7.8 10*3/uL (ref 4.0–10.5)
nRBC: 0 % (ref 0.0–0.2)

## 2019-03-25 LAB — GLUCOSE, CAPILLARY
Glucose-Capillary: 140 mg/dL — ABNORMAL HIGH (ref 70–99)
Glucose-Capillary: 138 mg/dL — ABNORMAL HIGH (ref 70–99)
Glucose-Capillary: 141 mg/dL — ABNORMAL HIGH (ref 70–99)
Glucose-Capillary: 97 mg/dL (ref 70–99)

## 2019-03-25 LAB — LIPID PANEL
Cholesterol: 99 mg/dL (ref 0–200)
HDL: 39 mg/dL — ABNORMAL LOW (ref >40)
LDL Cholesterol: 44 mg/dL (ref 0–99)
Total CHOL/HDL Ratio: 2.5 RATIO
Triglycerides: 80 mg/dL (ref <150)
VLDL: 16 mg/dL (ref 0–40)

## 2019-03-25 LAB — BASIC METABOLIC PANEL
Anion gap: 9 (ref 5–15)
BUN: 63 mg/dL — ABNORMAL HIGH (ref 8–23)
CO2: 22 mmol/L (ref 22–32)
Calcium: 9.2 mg/dL (ref 8.9–10.3)
Chloride: 110 mmol/L (ref 98–111)
Creatinine, Ser: 4.33 mg/dL — ABNORMAL HIGH (ref 0.61–1.24)
GFR calc Af Amer: 15 mL/min — ABNORMAL LOW (ref >60)
GFR calc non Af Amer: 13 mL/min — ABNORMAL LOW (ref >60)
Glucose, Bld: 161 mg/dL — ABNORMAL HIGH (ref 70–99)
Potassium: 4.6 mmol/L (ref 3.5–5.1)
Sodium: 141 mmol/L (ref 135–145)

## 2019-03-25 LAB — HEMOGLOBIN A1C
Hgb A1c MFr Bld: 7.5 % — ABNORMAL HIGH (ref 4.8–5.6)
Mean Plasma Glucose: 168.55 mg/dL

## 2019-03-25 LAB — HEPARIN LEVEL (UNFRACTIONATED): Heparin Unfractionated: 0.33 IU/mL (ref 0.30–0.70)

## 2019-03-25 LAB — ECHOCARDIOGRAM COMPLETE
Height: 74 in
Weight: 3454.4 oz

## 2019-03-25 MED ORDER — SALINE SPRAY 0.65 % NA SOLN
1.0000 | NASAL | Status: DC | PRN
  Filled 2019-03-25: qty 44

## 2019-03-25 NOTE — Progress Notes (Signed)
Progress Note  Patient Name: John [name] Date of Encounter: 03/25/2019  Primary Cardiologist: [facility]  Subjective   No acute events overnight. Chest pain free. Spoke to him (and his wife, via phone) at length this AM> Reviewed Dr. Antionette Char conversation re: risk of contrast induced nephropathy with cath.   Inpatient Medications    Scheduled Meds: . aspirin EC  81 mg Oral Daily  . atorvastatin  40 mg Oral q1800  . calcitRIOL  0.5 mcg Oral Daily  . gabapentin  200 mg Oral QHS  . insulin aspart  0-9 Units Subcutaneous TID WC  . isosorbide mononitrate  30 mg Oral Daily  . metoprolol succinate  50 mg Oral Daily  . tamsulosin  0.4 mg Oral QAC supper  . zolpidem  5 mg Oral QHS   Continuous Infusions: . heparin 1,400 Units/hr (03/25/19 0214)  .  sodium bicarbonate  infusion 1000 mL 50 mL/hr at 03/24/19 1719   PRN Meds: acetaminophen, alum & mag hydroxide-simeth, morphine injection, ondansetron (ZOFRAN) IV, polyvinyl alcohol   Vital Signs    Vitals:   03/24/19 2115 03/25/19 0440 03/25/19 0910 03/25/19 1134  BP: 140/84 125/77 129/78 125/77  Pulse: 79 77 75 74  Resp: 18 18 20 20   Temp: 97.7 F (36.5 C) 98.1 F (36.7 C) 97.8 F (36.6 C) 98 F (36.7 C)  TempSrc: Oral Oral Oral Oral  SpO2: 94% 93% 98% 97%  Weight:  97.9 kg    Height:        Intake/Output Summary (Last 24 hours) at 03/25/2019 1147 Last data filed at 03/25/2019 0700 Gross per 24 hour  Intake 480 ml  Output 3000 ml  Net -2520 ml   Last 3 Weights 03/25/2019 03/24/2019 12/01/2018  Weight (lbs) 215 lb 14.4 oz 216 lb 6.4 oz 205 lb  Weight (kg) 97.932 kg 98.158 kg 92.987 kg      Telemetry    NSR - Personally Reviewed  ECG    NSR - Personally Reviewed  Physical Exam   GEN: No acute distress.   Neck: No JVD Cardiac: RRR, no murmurs, rubs, or gallops.  Respiratory: Clear to auscultation bilaterally. GI: Soft, nontender, non-distended  MS: No edema; No deformity. Neuro:  Nonfocal  Psych: Normal affect    Labs    Chemistry Recent Labs  Lab 03/24/19 0945 03/24/19 2149  NA 140 142  K 5.7* 4.8  CL 113* 108  CO2 16* 20*  GLUCOSE 199* 134*  BUN 58* 63*  CREATININE 4.57* 4.71*  CALCIUM 9.3 9.6  GFRNONAA 13* 12*  GFRAA 15* 14*  ANIONGAP 11 14     Hematology Recent Labs  Lab 03/24/19 0945 03/25/19 0454  WBC 6.9 7.8  RBC 3.63* 3.39*  HGB 10.9* 10.1*  HCT 33.9* 31.2*  MCV 93.4 92.0  MCH 30.0 29.8  MCHC 32.2 32.4  RDW 13.2 13.2  PLT 142* 157    Cardiac Enzymes Recent Labs  Lab 03/24/19 0945 03/24/19 1600 03/24/19 2149  TROPONINI 3.78* 4.51* 4.16*   No results for input(s): TROPIPOC in the last 168 hours.   BNPNo results for input(s): BNP, PROBNP in the last 168 hours.   DDimer No results for input(s): DDIMER in the last 168 hours.   Radiology    No results found.  Cardiac Studies   Echo pending today  Patient Profile     68 y.o. male with PMH CAD, PVD, type II diabetes on insulin, stage 5 CKD with AV fistula and undergoing transplant evaluation transferred  from [place] for chest pain and NSTEMI.   Assessment & Plan    NSTEMI with known CAD/PVD -remains chest pain free, reviewed that he needs to let us know if the chest pain returns -on IV heparin. Has mild blood with blowing his nose, which he notes he had at home. Will give saline nasal spray. -continue aspirin 81 mg daily, atorvastatin 40 mg daily, metoprolol succinate 50 mg daily -imdur added yesterday, tolerating well -LDL 44, well controlled on current dose of atorvastatin -stable anemia  Stage 5 CKD, with fistula but not yet on dialysis -appreciate nephrology assistance, tolerating bicarb drip -K down to 4.8 today -Cr 4.71  Type II diabetes on insulin: management per primary team. A1c 7.5  TIME SPENT WITH PATIENT: 25 minutes of direct patient care. More than 50% of that time was spent on coordination of care and counseling regarding symptoms and plan for management.  Buford Dresser,  MD, PhD [facility]   For questions or updates, please contact [place] Please consult [email or web address] for contact info under     Signed, Buford Dresser, MD  03/25/2019, 11:47 AM

## 2019-03-25 NOTE — Progress Notes (Signed)
  Echocardiogram 2D Echocardiogram has been performed.  John [name] 03/25/2019, 8:49 AM

## 2019-03-25 NOTE — Progress Notes (Signed)
Admit: 03/24/2019 LOS: 1  80M with CKD5, Hyperkalemia, presenting with NSTEMI  Subjective:  . No interval events . For [facility] 5/4 . Good UOP . K improved  . L eye irritated  05/01 0701 - 05/02 0700 In: 480 [P.O.:480] Out: 3600 [Urine:3600]  Filed Weights   03/24/19 0654 03/25/19 0440  Weight: 98.2 kg 97.9 kg    Scheduled Meds: . aspirin EC  81 mg Oral Daily  . atorvastatin  40 mg Oral q1800  . calcitRIOL  0.5 mcg Oral Daily  . gabapentin  200 mg Oral QHS  . insulin aspart  0-9 Units Subcutaneous TID WC  . isosorbide mononitrate  30 mg Oral Daily  . metoprolol succinate  50 mg Oral Daily  . patiromer  16.8 g Oral Daily  . tamsulosin  0.4 mg Oral QAC supper  . zolpidem  5 mg Oral QHS   Continuous Infusions: . heparin 1,400 Units/hr (03/25/19 0214)  .  sodium bicarbonate  infusion 1000 mL 50 mL/hr at 03/24/19 1719   PRN Meds:.acetaminophen, alum & mag hydroxide-simeth, morphine injection, ondansetron (ZOFRAN) IV, polyvinyl alcohol  Current Labs: reviewed    Physical Exam:  Blood pressure 129/78, pulse 75, temperature 97.8 F (36.6 C), temperature source Oral, resp. rate 20, height 6\' 2"  (1.88 m), weight 97.9 kg, SpO2 98 %. GEN: Well-appearing, no distress, lying flat in the bed ENT: NCAT EYES: EOMI CV: Regular, normal S1 and S2, no murmur or rub PULM: Clear bilaterally, normal work of breathing ABD: Soft, nontender SKIN: No rashes or lesions EXT: No peripheral edema  A 1. CKD5 without uremia 2. Hyperkalemia, resovld with veltassa + bicarbonate 3. NSTEMI for LHC 5/4 4. NAG metabolic acidosis 5. PVD hx/o RAS and Ao-BiFem Bypass 6. 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 Mature RUE BC AVF 8. Mild anemia 9. DM2 10. HTN  P . See consult note . Cont NaHCO3 gtt over weekend, fluid status permitting . Follow K off Veltassa . Will follow along, pt aware of renal risk of LHC . Medication Issues; o Preferred narcotic agents for pain control are hydromorphone, fentanyl, and methadone.  Morphine should not be used.  o Baclofen should be avoided o Avoid oral sodium phosphate and magnesium citrate based laxatives / bowel preps    Pearson Grippe MD 03/25/2019, 9:56 AM  Recent Labs  Lab 03/24/19 0945 03/24/19 2149  NA 140 142  K 5.7* 4.8  CL 113* 108  CO2 16* 20*  GLUCOSE 199* 134*  BUN 58* 63*  CREATININE 4.57* 4.71*  CALCIUM 9.3 9.6   Recent Labs  Lab 03/24/19 0945 03/25/19 0454  WBC 6.9 7.8  HGB 10.9* 10.1*  HCT 33.9* 31.2*  MCV 93.4 92.0  PLT 142* 157

## 2019-03-25 NOTE — Progress Notes (Signed)
PROGRESS NOTE    John [name]  WUJ:[id number] DOB: 09-29-1951 DOA: 03/24/2019 PCP: Imagene Riches, NP    Brief Narrative:  68 year old male who presented with chest pain.  He does have significant past medical history for hypertension, dyslipidemia, type 2 diabetes mellitus, coronary artery disease status post RCA stenting, chronic kidney disease stage V status post AV fistula, and peripheral vascular disease status post aortobifemoral bypass.  He reported acute onset of chest pain, substernal, associated with dyspnea, no radiation. Worsening precordial chest pressure, initially improved with rest, then only with sublingual nitroglycerin. On his initial physical examination his blood pressure was 131/78, heart rate 86, respiratory rate 18, temperature 97.5, oxygen saturation 98%, he had mucous membranes, his lungs were clear to auscultation bilaterally, heart S1-S2 present and rhythmic, abdomen was soft nontender, no lower extremity edema.  Right upper extremity fistula.  Sodium 142, potassium 4.8, chloride 108, bicarb 20, glucose 134, BUN 63, creatinine 4.71, troponin 4.16, white count 7.8, hemoglobin 10.1, hematocrit 31.2, platelets 157.  His EKG had 87 bpm, normal axis normal intervals, sinus rhythm with normal conduction, J-point elevation V1 to V3, no T wave changes.  Patient was admitted to the hospital working diagnosis of atypical chest pain, complicated by troponin elevation, rule out acute coronary syndrome.   Assessment & Plan:   Active Problems:   DM (diabetes mellitus), type 2 with renal complications (HCC)   Essential hypertension   HLD (hyperlipidemia)   Chest pain   CKD (chronic kidney disease), stage V (HCC)   NSTEMI (non-ST elevated myocardial infarction) ([place])  1. CAD with NSTEMI. Patient with no further chest pain, troponin I is trending down. Follow on echocardiography. Continue medical therapy with IV heparin. Continue asa, isosorbide, statin and B blockade. No RASS  inhibition due to reduced GFR. Plan for coronary angiography on 05/04.   2. CKD stage V. No signs of volume overload, serum K has been trending down to 4,8 and serum bicarbonate at 20 from 16. Patient has been on serum bicarbonate infusion. Will check today's BMP. Metabolic bone disease, continue calcitriol.   3. HTN. Today's blood pressure 125/77. Continue blood pressure control with isosorbide and metoprolol, for blood pressure control.   4. T2DM. Capillary glucose have been well controlled, 212, 114, 140, 138. Continue insulin sliding scale for glucose cover and monitoring. At home on linagliptin, dulaglutide, and glipizide.   5. Dyslipidemia. Continue atorvastatin. LDL is 44 with HDL 39 and total cholesterol 99.    DVT prophylaxis: heparin IV   Code Status:  full Family Communication: no family at the bedside  Disposition Plan/ discharge barriers: pending cardiac cath on 05/04  Body mass index is 27.72 kg/m. Malnutrition Type:      Malnutrition Characteristics:      Nutrition Interventions:     RN Pressure Injury Documentation:     Consultants:   Cardiology   Nephrology   Procedures:     Antimicrobials:       Subjective: This am with no chest pain, no nausea or vomiting, no dyspnea or lower extremity edema. Had worsening angina type chest pain at home, for the last few months. Worse on the day of admission.   Objective: Vitals:   03/24/19 1118 03/24/19 2115 03/25/19 0440 03/25/19 0910  BP: (!) 153/95 140/84 125/77 129/78  Pulse: 90 79 77 75  Resp: 18 18 18 20   Temp: 98.7 F (37.1 C) 97.7 F (36.5 C) 98.1 F (36.7 C) 97.8 F (36.6 C)  TempSrc: Oral Oral  Oral Oral  SpO2: 98% 94% 93% 98%  Weight:   97.9 kg   Height:        Intake/Output Summary (Last 24 hours) at 03/25/2019 1000 Last data filed at 03/25/2019 0700 Gross per 24 hour  Intake 480 ml  Output 3600 ml  Net -3120 ml   Filed Weights   03/24/19 0654 03/25/19 0440  Weight: 98.2 kg  97.9 kg    Examination:   General: Not in pain or dyspnea, Neurology: Awake and alert, non focal  E ENT: no pallor, no icterus, oral mucosa moist Cardiovascular: No JVD. S1-S2 present, rhythmic, no gallops, rubs, or murmurs. No lower extremity edema. Pulmonary: vesicular breath sounds bilaterally, adequate air movement, no wheezing, rhonchi or rales. Gastrointestinal. Abdomen with no organomegaly, non tender, no rebound or guarding Skin. No rashes Musculoskeletal: no joint deformities     Data Reviewed: I have personally reviewed following labs and imaging studies  CBC: Recent Labs  Lab 03/24/19 0945 03/25/19 0454  WBC 6.9 7.8  HGB 10.9* 10.1*  HCT 33.9* 31.2*  MCV 93.4 92.0  PLT 142* 893   Basic Metabolic Panel: Recent Labs  Lab 03/24/19 0945 03/24/19 2149  NA 140 142  K 5.7* 4.8  CL 113* 108  CO2 16* 20*  GLUCOSE 199* 134*  BUN 58* 63*  CREATININE 4.57* 4.71*  CALCIUM 9.3 9.6   GFR: Estimated Creatinine Clearance: 18.2 mL/min (A) (by C-G formula based on SCr of 4.71 mg/dL (H)). Liver Function Tests: No results for input(s): AST, ALT, ALKPHOS, BILITOT, PROT, ALBUMIN in the last 168 hours. No results for input(s): LIPASE, AMYLASE in the last 168 hours. No results for input(s): AMMONIA in the last 168 hours. Coagulation Profile: No results for input(s): INR, PROTIME in the last 168 hours. Cardiac Enzymes: Recent Labs  Lab 03/24/19 0945 03/24/19 1600 03/24/19 2149  TROPONINI 3.78* 4.51* 4.16*   BNP (last 3 results) No results for input(s): PROBNP in the last 8760 hours. HbA1C: Recent Labs    03/25/19 0454  HGBA1C 7.5*   CBG: Recent Labs  Lab 03/24/19 1114 03/24/19 1638 03/24/19 2120 03/25/19 0631  GLUCAP 150* 212* 114* 140*   Lipid Profile: Recent Labs    03/25/19 0454  CHOL 99  HDL 39*  LDLCALC 44  TRIG 80  CHOLHDL 2.5   Thyroid Function Tests: No results for input(s): TSH, T4TOTAL, FREET4, T3FREE, THYROIDAB in the last 72 hours.  Anemia Panel: No results for input(s): VITAMINB12, FOLATE, FERRITIN, TIBC, IRON, RETICCTPCT in the last 72 hours.    Radiology Studies: I have reviewed all of the imaging during this hospital visit personally     Scheduled Meds: . aspirin EC  81 mg Oral Daily  . atorvastatin  40 mg Oral q1800  . calcitRIOL  0.5 mcg Oral Daily  . gabapentin  200 mg Oral QHS  . insulin aspart  0-9 Units Subcutaneous TID WC  . isosorbide mononitrate  30 mg Oral Daily  . metoprolol succinate  50 mg Oral Daily  . tamsulosin  0.4 mg Oral QAC supper  . zolpidem  5 mg Oral QHS   Continuous Infusions: . heparin 1,400 Units/hr (03/25/19 0214)  .  sodium bicarbonate  infusion 1000 mL 50 mL/hr at 03/24/19 1719     LOS: 1 day        Caeli Linehan Gerome Apley, MD

## 2019-03-25 NOTE — Progress Notes (Signed)
ANTICOAGULATION CONSULT NOTE - Follow Up Consult  Pharmacy Consult for Heparin Indication: NSTEMI  Allergies  Allergen Reactions  . Penicillins Rash and Other (See Comments)    Has patient had a PCN reaction causing immediate rash, facial/tongue/throat swelling, SOB or lightheadedness with hypotension: Yes Has patient had a PCN reaction causing severe rash involving mucus membranes or skin necrosis: No Has patient had a PCN reaction that required hospitalization: No Has patient had a PCN reaction occurring within the last 10 years: No If all of the above answers are "NO", then may proceed with Cephalosporin use.    Patient Measurements: Height: 6\' 2"  (188 cm) Weight: 215 lb 14.4 oz (97.9 kg) IBW/kg (Calculated) : 82.2 Heparin Dosing Weight: 98.2 kg  Vital Signs: Temp: 98.1 F (36.7 C) (05/02 0440) Temp Source: Oral (05/02 0440) BP: 125/77 (05/02 0440) Pulse Rate: 77 (05/02 0440)  Labs: Recent Labs    03/24/19 0945 03/24/19 1600 03/24/19 1924 03/24/19 2149 03/25/19 0454  HGB 10.9*  --   --   --  10.1*  HCT 33.9*  --   --   --  31.2*  PLT 142*  --   --   --  157  HEPARINUNFRC <0.10*  --  0.22*  --  0.33  CREATININE 4.57*  --   --  4.71*  --   TROPONINI 3.78* 4.51*  --  4.16*  --     Estimated Creatinine Clearance: 18.2 mL/min (A) (by C-G formula based on SCr of 4.71 mg/dL (H)).  Assessment: Anticoag: Heparin running at 600 units/hr from [place]. HL 0.22>0.33 now in goal. Hgb 10.1 low. Plts 157.Cardiac catheterization would most definitely worsen renal function, necessitating HD.  Goal of Therapy:  Heparin level 0.3-0.7 units/ml Monitor platelets by anticoagulation protocol: Yes   Plan:  Cath vs medical management. IV heparin 1400 units/hr Daily HL and CBC  Ranger Petrich S. Alford Highland, PharmD, BCPS Clinical Staff Pharmacist Eilene Ghazi Stillinger 03/25/2019,8:36 AM

## 2019-03-26 ENCOUNTER — Encounter (HOSPITAL_COMMUNITY): Payer: Self-pay

## 2019-03-26 ENCOUNTER — Other Ambulatory Visit: Payer: Self-pay

## 2019-03-26 LAB — GLUCOSE, CAPILLARY
Glucose-Capillary: 139 mg/dL — ABNORMAL HIGH (ref 70–99)
Glucose-Capillary: 142 mg/dL — ABNORMAL HIGH (ref 70–99)
Glucose-Capillary: 112 mg/dL — ABNORMAL HIGH (ref 70–99)
Glucose-Capillary: 162 mg/dL — ABNORMAL HIGH (ref 70–99)

## 2019-03-26 LAB — CBC
HCT: 33.6 % — ABNORMAL LOW (ref 39.0–52.0)
Hemoglobin: 10.9 g/dL — ABNORMAL LOW (ref 13.0–17.0)
MCH: 29.9 pg (ref 26.0–34.0)
MCHC: 32.4 g/dL (ref 30.0–36.0)
MCV: 92.1 fL (ref 80.0–100.0)
Platelets: 159 10*3/uL (ref 150–400)
RBC: 3.65 MIL/uL — ABNORMAL LOW (ref 4.22–5.81)
RDW: 13.2 % (ref 11.5–15.5)
WBC: 7.6 10*3/uL (ref 4.0–10.5)
nRBC: 0 % (ref 0.0–0.2)

## 2019-03-26 LAB — BASIC METABOLIC PANEL
Anion gap: 9 (ref 5–15)
BUN: 61 mg/dL — ABNORMAL HIGH (ref 8–23)
CO2: 24 mmol/L (ref 22–32)
Calcium: 9.4 mg/dL (ref 8.9–10.3)
Chloride: 109 mmol/L (ref 98–111)
Creatinine, Ser: 4.23 mg/dL — ABNORMAL HIGH (ref 0.61–1.24)
GFR calc Af Amer: 16 mL/min — ABNORMAL LOW (ref >60)
GFR calc non Af Amer: 14 mL/min — ABNORMAL LOW (ref >60)
Glucose, Bld: 136 mg/dL — ABNORMAL HIGH (ref 70–99)
Potassium: 4.7 mmol/L (ref 3.5–5.1)
Sodium: 142 mmol/L (ref 135–145)

## 2019-03-26 LAB — HEPARIN LEVEL (UNFRACTIONATED): Heparin Unfractionated: 0.39 IU/mL (ref 0.30–0.70)

## 2019-03-26 MED ORDER — SODIUM CHLORIDE 0.9% FLUSH
3.0000 mL | Freq: Two times a day (BID) | INTRAVENOUS | Status: DC
  Administered 2019-03-26: 3 mL via INTRAVENOUS

## 2019-03-26 MED ORDER — SODIUM CHLORIDE 0.9 % IV SOLN
INTRAVENOUS | Status: DC
  Administered 2019-03-26: via INTRAVENOUS

## 2019-03-26 MED ORDER — SODIUM CHLORIDE 0.9% FLUSH: 3.0000 mL | INTRAVENOUS | Status: DC | PRN

## 2019-03-26 MED ORDER — SODIUM CHLORIDE 0.9 % IV SOLN: 250.0000 mL | INTRAVENOUS | Status: DC | PRN

## 2019-03-26 MED ORDER — ASPIRIN 81 MG PO CHEW
81.0000 mg | CHEWABLE_TABLET | ORAL | Status: AC
  Administered 2019-03-27: 81 mg via ORAL
  Filled 2019-03-26: qty 1

## 2019-03-26 NOTE — Progress Notes (Signed)
PROGRESS NOTE    John [name]  UMP:[id number] DOB: 1951-09-11 DOA: 03/24/2019 PCP: Imagene Riches, NP    Brief Narrative:  68 year old male who presented with chest pain.  He does have significant past medical history for hypertension, dyslipidemia, type 2 diabetes mellitus, coronary artery disease status post RCA stenting, chronic kidney disease stage V status post AV fistula, and peripheral vascular disease status post aortobifemoral bypass.  He reported acute onset of chest pain, substernal, associated with dyspnea, no radiation. Worsening precordial chest pressure, initially improved with rest, then only with sublingual nitroglycerin. On his initial physical examination his blood pressure was 131/78, heart rate 86, respiratory rate 18, temperature 97.5, oxygen saturation 98%, he had mucous membranes, his lungs were clear to auscultation bilaterally, heart S1-S2 present and rhythmic, abdomen was soft nontender, no lower extremity edema.  Right upper extremity fistula.  Sodium 142, potassium 4.8, chloride 108, bicarb 20, glucose 134, BUN 63, creatinine 4.71, troponin 4.16, white count 7.8, hemoglobin 10.1, hematocrit 31.2, platelets 157.  His EKG had 87 bpm, normal axis normal intervals, sinus rhythm with normal conduction, J-point elevation V1 to V3, no T wave changes.  Patient was admitted to the hospital working diagnosis of atypical chest pain, complicated by troponin elevation, rule out acute coronary syndrome.    Assessment & Plan:   Active Problems:   DM (diabetes mellitus), type 2 with renal complications (HCC)   Essential hypertension   HLD (hyperlipidemia)   Chest pain   CKD (chronic kidney disease), stage V (HCC)   NSTEMI (non-ST elevated myocardial infarction) ([place])  1. CAD with NSTEMI. Patient continue to be chest pain free, echocardiography with preserved LV systolic function and no wall motion abnormalities.  On IV heparin, asa, isosorbide, atorvastatin and metoprolol.  Continue to hold on  RASS inhibition risk for worsening renal function. Pending coronary angiography in am.  2. CKD stage V. Patient has been tolerating well bicarb infusion. No signs of volume overload or hypernatremia. His serum bicarbonate is 24 with K at 4,7 and serum creatinine at 4.23. Will continue bicarb infusion, for prophylaxis for contrast induced nephropathy per nephrology recommendations. Follow renal panel in am. Avoid hypotension and nephrotoxic medications. Continue calcitriol for metabolic bone disease.  3. HTN. Continue isosorbide and metoprolol, for blood pressure control, blood pressure 124/71 mmHg.    4. T2DM. Fasting glucose this am at 136. On insulin sliding scale for glucose cover and monitoring. Continue to hold on linagliptin, dulaglutide, and glipizide.   5. Dyslipidemia. On atorvastatin. LDL on target at 44.   DVT prophylaxis: heparin IV   Code Status:  full Family Communication: no family at the bedside  Disposition Plan/ discharge barriers: pending cardiac cath on 05/04  Body mass index is 27.28 kg/m. Malnutrition Type:      Malnutrition Characteristics:      Nutrition Interventions:     RN Pressure Injury Documentation:     Consultants:   Cardiology   Procedures:     Antimicrobials:       Subjective: Patient is feeling well. No chest pain or dyspnea, continue to be on heparin drip. No nausea or vomiting.   Objective: Vitals:   03/25/19 2101 03/26/19 0450 03/26/19 0454 03/26/19 0700  BP: (!) 146/90  (!) 144/81   Pulse: 79  82   Resp: 18  18   Temp: 98.2 F (36.8 C)  98.5 F (36.9 C)   TempSrc: Oral  Oral   SpO2: 98%  (!) 85% 96%  Weight:  96.4 kg    Height:        Intake/Output Summary (Last 24 hours) at 03/26/2019 1147 Last data filed at 03/26/2019 1000 Gross per 24 hour  Intake 2727.76 ml  Output 3676 ml  Net -948.24 ml   Filed Weights   03/24/19 0654 03/25/19 0440 03/26/19 0450  Weight: 98.2 kg 97.9 kg 96.4  kg    Examination:   General: Not in pain or dyspnea  Neurology: Awake and alert, non focal  E ENT: no pallor, no icterus, oral mucosa moist Cardiovascular: No JVD. S1-S2 present, rhythmic, no gallops, rubs, or murmurs. No lower extremity edema. Pulmonary: vesicular breath sounds bilaterally, adequate air movement, no wheezing, rhonchi or rales. Gastrointestinal. Abdomen with no organomegaly, non tender, no rebound or guarding Skin. No rashes Musculoskeletal: no joint deformities     Data Reviewed: I have personally reviewed following labs and imaging studies  CBC: Recent Labs  Lab 03/24/19 0945 03/25/19 0454 03/26/19 0504  WBC 6.9 7.8 7.6  HGB 10.9* 10.1* 10.9*  HCT 33.9* 31.2* 33.6*  MCV 93.4 92.0 92.1  PLT 142* 157 856   Basic Metabolic Panel: Recent Labs  Lab 03/24/19 0945 03/24/19 2149 03/25/19 0454 03/26/19 0504  NA 140 142 141 142  K 5.7* 4.8 4.6 4.7  CL 113* 108 110 109  CO2 16* 20* 22 24  GLUCOSE 199* 134* 161* 136*  BUN 58* 63* 63* 61*  CREATININE 4.57* 4.71* 4.33* 4.23*  CALCIUM 9.3 9.6 9.2 9.4   GFR: Estimated Creatinine Clearance: 20.2 mL/min (A) (by C-G formula based on SCr of 4.23 mg/dL (H)). Liver Function Tests: No results for input(s): AST, ALT, ALKPHOS, BILITOT, PROT, ALBUMIN in the last 168 hours. No results for input(s): LIPASE, AMYLASE in the last 168 hours. No results for input(s): AMMONIA in the last 168 hours. Coagulation Profile: No results for input(s): INR, PROTIME in the last 168 hours. Cardiac Enzymes: Recent Labs  Lab 03/24/19 0945 03/24/19 1600 03/24/19 2149  TROPONINI 3.78* 4.51* 4.16*   BNP (last 3 results) No results for input(s): PROBNP in the last 8760 hours. HbA1C: Recent Labs    03/25/19 0454  HGBA1C 7.5*   CBG: Recent Labs  Lab 03/25/19 1132 03/25/19 1619 03/25/19 2054 03/26/19 0652 03/26/19 1113  GLUCAP 138* 141* 97 139* 142*   Lipid Profile: Recent Labs    03/25/19 0454  CHOL 99  HDL 39*   LDLCALC 44  TRIG 80  CHOLHDL 2.5   Thyroid Function Tests: No results for input(s): TSH, T4TOTAL, FREET4, T3FREE, THYROIDAB in the last 72 hours. Anemia Panel: No results for input(s): VITAMINB12, FOLATE, FERRITIN, TIBC, IRON, RETICCTPCT in the last 72 hours.    Radiology Studies: I have reviewed all of the imaging during this hospital visit personally     Scheduled Meds: . aspirin EC  81 mg Oral Daily  . atorvastatin  40 mg Oral q1800  . calcitRIOL  0.5 mcg Oral Daily  . gabapentin  200 mg Oral QHS  . insulin aspart  0-9 Units Subcutaneous TID WC  . isosorbide mononitrate  30 mg Oral Daily  . metoprolol succinate  50 mg Oral Daily  . tamsulosin  0.4 mg Oral QAC supper  . zolpidem  5 mg Oral QHS   Continuous Infusions: . heparin 1,400 Units/hr (03/25/19 2015)  .  sodium bicarbonate  infusion 1000 mL 50 mL/hr at 03/26/19 0940     LOS: 2 days         Gerome Apley, MD

## 2019-03-26 NOTE — Progress Notes (Signed)
Progress Note  Patient Name: John [name] Date of Encounter: 03/26/2019  Primary Cardiologist: [facility]  Subjective   No acute events overnight. Chest pain free. Planned for cath tomorrow. Doing well overall.  Inpatient Medications    Scheduled Meds: . [START ON 03/27/2019] aspirin  81 mg Oral Pre-Cath  . aspirin EC  81 mg Oral Daily  . atorvastatin  40 mg Oral q1800  . calcitRIOL  0.5 mcg Oral Daily  . gabapentin  200 mg Oral QHS  . insulin aspart  0-9 Units Subcutaneous TID WC  . isosorbide mononitrate  30 mg Oral Daily  . metoprolol succinate  50 mg Oral Daily  . sodium chloride flush  3 mL Intravenous Q12H  . tamsulosin  0.4 mg Oral QAC supper  . zolpidem  5 mg Oral QHS   Continuous Infusions: . sodium chloride    . [START ON 03/27/2019] sodium chloride    . heparin 1,400 Units/hr (03/25/19 2015)  .  sodium bicarbonate  infusion 1000 mL 50 mL/hr at 03/26/19 0940   PRN Meds: sodium chloride, acetaminophen, alum & mag hydroxide-simeth, morphine injection, ondansetron (ZOFRAN) IV, polyvinyl alcohol, sodium chloride, sodium chloride flush   Vital Signs    Vitals:   03/26/19 0450 03/26/19 0454 03/26/19 0700 03/26/19 1116  BP:  (!) 144/81  124/71  Pulse:  82  75  Resp:  18  20  Temp:  98.5 F (36.9 C)  98.7 F (37.1 C)  TempSrc:  Oral  Oral  SpO2:  (!) 85% 96% 97%  Weight: 96.4 kg     Height:        Intake/Output Summary (Last 24 hours) at 03/26/2019 1306 Last data filed at 03/26/2019 1100 Gross per 24 hour  Intake 2443.93 ml  Output 4276 ml  Net -1832.07 ml   Last 3 Weights 03/26/2019 03/25/2019 03/24/2019  Weight (lbs) 212 lb 8 oz 215 lb 14.4 oz 216 lb 6.4 oz  Weight (kg) 96.389 kg 97.932 kg 98.158 kg      Telemetry    NSR - Personally Reviewed  ECG    NSR - Personally Reviewed  Physical Exam   GEN: No acute distress.   Neck: No JVD Cardiac: RRR, no murmurs, rubs, or gallops.  Respiratory: Clear to auscultation bilaterally. GI: Soft, nontender,  non-distended  MS: No edema; No deformity. Neuro:  Nonfocal  Psych: Normal affect   Labs    Chemistry Recent Labs  Lab 03/24/19 2149 03/25/19 0454 03/26/19 0504  NA 142 141 142  K 4.8 4.6 4.7  CL 108 110 109  CO2 20* 22 24  GLUCOSE 134* 161* 136*  BUN 63* 63* 61*  CREATININE 4.71* 4.33* 4.23*  CALCIUM 9.6 9.2 9.4  GFRNONAA 12* 13* 14*  GFRAA 14* 15* 16*  ANIONGAP 14 9 9      Hematology Recent Labs  Lab 03/24/19 0945 03/25/19 0454 03/26/19 0504  WBC 6.9 7.8 7.6  RBC 3.63* 3.39* 3.65*  HGB 10.9* 10.1* 10.9*  HCT 33.9* 31.2* 33.6*  MCV 93.4 92.0 92.1  MCH 30.0 29.8 29.9  MCHC 32.2 32.4 32.4  RDW 13.2 13.2 13.2  PLT 142* 157 159    Cardiac Enzymes Recent Labs  Lab 03/24/19 0945 03/24/19 1600 03/24/19 2149  TROPONINI 3.78* 4.51* 4.16*   No results for input(s): TROPIPOC in the last 168 hours.   BNPNo results for input(s): BNP, PROBNP in the last 168 hours.   DDimer No results for input(s): DDIMER in the last 168 hours.  Radiology    No results found.  Cardiac Studies   Echo 03/25/19  1. The left ventricle has low normal systolic function, with an ejection fraction of 50-55%. The cavity size was mild to moderately dilated. Left ventricular diastolic Doppler parameters are consistent with impaired relaxation.  2. The right ventricle has normal systolic function. The cavity was normal. There is no increase in right ventricular wall thickness.  3. Left atrial size was moderately dilated.  4. The aortic valve is tricuspid. Mild calcification of the aortic valve.  5. The aortic root is normal in size and structure.  6. The inferior vena cava was dilated in size with >50% respiratory variability.  Patient Profile     68 y.o. male with PMH CAD, PVD, type II diabetes on insulin, stage 5 CKD with AV fistula and undergoing transplant evaluation transferred from [place] [facility] for chest pain and NSTEMI.   Assessment & Plan    NSTEMI with known CAD/PVD -remains  chest pain free -on IV heparin. No more nose bleeding today. -continue aspirin 81 mg daily, atorvastatin 40 mg daily, metoprolol succinate 50 mg daily -imdur added this admission, tolerating well -LDL 44, well controlled on current dose of atorvastatin -stable anemia -planned for cath tomorrow, NPO at midnight, orders in.  Stage 5 CKD, with fistula but not yet on dialysis -appreciate nephrology assistance, tolerating bicarb drip -K down to 4.7 today -Cr decreasing to 4.23 today  Type II diabetes on insulin: management per primary team. A1c 7.5  TIME SPENT WITH PATIENT: 15 minutes of direct patient care. More than 50% of that time was spent on coordination of care and counseling regarding symptoms and plan for management.  Buford Dresser, MD, PhD [facility]   For questions or updates, please contact [place] Please consult [email or web address] for contact info under     Signed, Buford Dresser, MD  03/26/2019, 1:06 PM

## 2019-03-26 NOTE — Progress Notes (Signed)
Patient stated during report that he had been in on plavix in the past for current stent x1, stated that he could not afford it, plans for have a cath to reevaluate with poss intervention watching kidneys with IV fluids infusing.

## 2019-03-26 NOTE — H&P (View-Only) (Signed)
Progress Note  Patient Name: John [name] Date of Encounter: 03/26/2019  Primary Cardiologist: [facility]  Subjective   No acute events overnight. Chest pain free. Planned for cath tomorrow. Doing well overall.  Inpatient Medications    Scheduled Meds: . [START ON 03/27/2019] aspirin  81 mg Oral Pre-Cath  . aspirin EC  81 mg Oral Daily  . atorvastatin  40 mg Oral q1800  . calcitRIOL  0.5 mcg Oral Daily  . gabapentin  200 mg Oral QHS  . insulin aspart  0-9 Units Subcutaneous TID WC  . isosorbide mononitrate  30 mg Oral Daily  . metoprolol succinate  50 mg Oral Daily  . sodium chloride flush  3 mL Intravenous Q12H  . tamsulosin  0.4 mg Oral QAC supper  . zolpidem  5 mg Oral QHS   Continuous Infusions: . sodium chloride    . [START ON 03/27/2019] sodium chloride    . heparin 1,400 Units/hr (03/25/19 2015)  .  sodium bicarbonate  infusion 1000 mL 50 mL/hr at 03/26/19 0940   PRN Meds: sodium chloride, acetaminophen, alum & mag hydroxide-simeth, morphine injection, ondansetron (ZOFRAN) IV, polyvinyl alcohol, sodium chloride, sodium chloride flush   Vital Signs    Vitals:   03/26/19 0450 03/26/19 0454 03/26/19 0700 03/26/19 1116  BP:  (!) 144/81  124/71  Pulse:  82  75  Resp:  18  20  Temp:  98.5 F (36.9 C)  98.7 F (37.1 C)  TempSrc:  Oral  Oral  SpO2:  (!) 85% 96% 97%  Weight: 96.4 kg     Height:        Intake/Output Summary (Last 24 hours) at 03/26/2019 1306 Last data filed at 03/26/2019 1100 Gross per 24 hour  Intake 2443.93 ml  Output 4276 ml  Net -1832.07 ml   Last 3 Weights 03/26/2019 03/25/2019 03/24/2019  Weight (lbs) 212 lb 8 oz 215 lb 14.4 oz 216 lb 6.4 oz  Weight (kg) 96.389 kg 97.932 kg 98.158 kg      Telemetry    NSR - Personally Reviewed  ECG    NSR - Personally Reviewed  Physical Exam   GEN: No acute distress.   Neck: No JVD Cardiac: RRR, no murmurs, rubs, or gallops.  Respiratory: Clear to auscultation bilaterally. GI: Soft, nontender,  non-distended  MS: No edema; No deformity. Neuro:  Nonfocal  Psych: Normal affect   Labs    Chemistry Recent Labs  Lab 03/24/19 2149 03/25/19 0454 03/26/19 0504  NA 142 141 142  K 4.8 4.6 4.7  CL 108 110 109  CO2 20* 22 24  GLUCOSE 134* 161* 136*  BUN 63* 63* 61*  CREATININE 4.71* 4.33* 4.23*  CALCIUM 9.6 9.2 9.4  GFRNONAA 12* 13* 14*  GFRAA 14* 15* 16*  ANIONGAP 14 9 9      Hematology Recent Labs  Lab 03/24/19 0945 03/25/19 0454 03/26/19 0504  WBC 6.9 7.8 7.6  RBC 3.63* 3.39* 3.65*  HGB 10.9* 10.1* 10.9*  HCT 33.9* 31.2* 33.6*  MCV 93.4 92.0 92.1  MCH 30.0 29.8 29.9  MCHC 32.2 32.4 32.4  RDW 13.2 13.2 13.2  PLT 142* 157 159    Cardiac Enzymes Recent Labs  Lab 03/24/19 0945 03/24/19 1600 03/24/19 2149  TROPONINI 3.78* 4.51* 4.16*   No results for input(s): TROPIPOC in the last 168 hours.   BNPNo results for input(s): BNP, PROBNP in the last 168 hours.   DDimer No results for input(s): DDIMER in the last 168 hours.  Radiology    No results found.  Cardiac Studies   Echo 03/25/19  1. The left ventricle has low normal systolic function, with an ejection fraction of 50-55%. The cavity size was mild to moderately dilated. Left ventricular diastolic Doppler parameters are consistent with impaired relaxation.  2. The right ventricle has normal systolic function. The cavity was normal. There is no increase in right ventricular wall thickness.  3. Left atrial size was moderately dilated.  4. The aortic valve is tricuspid. Mild calcification of the aortic valve.  5. The aortic root is normal in size and structure.  6. The inferior vena cava was dilated in size with >50% respiratory variability.  Patient Profile     68 y.o. male with PMH CAD, PVD, type II diabetes on insulin, stage 5 CKD with AV fistula and undergoing transplant evaluation transferred from [facility] for chest pain and NSTEMI.   Assessment & Plan    NSTEMI with known CAD/PVD -remains  chest pain free -on IV heparin. No more nose bleeding today. -continue aspirin 81 mg daily, atorvastatin 40 mg daily, metoprolol succinate 50 mg daily -imdur added this admission, tolerating well -LDL 44, well controlled on current dose of atorvastatin -stable anemia -planned for cath tomorrow, NPO at midnight, orders in.  Stage 5 CKD, with fistula but not yet on dialysis -appreciate nephrology assistance, tolerating bicarb drip -K down to 4.7 today -Cr decreasing to 4.23 today  Type II diabetes on insulin: management per primary team. A1c 7.5  TIME SPENT WITH PATIENT: 15 minutes of direct patient care. More than 50% of that time was spent on coordination of care and counseling regarding symptoms and plan for management.  Buford Dresser, MD, PhD [facility]   For questions or updates, please contact [place] Please consult [email or web address] for contact info under     Signed, Buford Dresser, MD  03/26/2019, 1:06 PM

## 2019-03-26 NOTE — Progress Notes (Signed)
ANTICOAGULATION CONSULT NOTE - Follow Up Consult  Pharmacy Consult for Heparin Indication: NSTEMI  Allergies  Allergen Reactions  . Penicillins Rash and Other (See Comments)    Has patient had a PCN reaction causing immediate rash, facial/tongue/throat swelling, SOB or lightheadedness with hypotension: Yes Has patient had a PCN reaction causing severe rash involving mucus membranes or skin necrosis: No Has patient had a PCN reaction that required hospitalization: No Has patient had a PCN reaction occurring within the last 10 years: No If all of the above answers are "NO", then may proceed with Cephalosporin use.    Patient Measurements: Height: 6\' 2"  (188 cm) Weight: 212 lb 8 oz (96.4 kg) IBW/kg (Calculated) : 82.2 Heparin Dosing Weight: 98.2 kg  Vital Signs: Temp: 98.5 F (36.9 C) (05/03 0454) Temp Source: Oral (05/03 0454) BP: 144/81 (05/03 0454) Pulse Rate: 82 (05/03 0454)  Labs: Recent Labs    03/24/19 0945 03/24/19 1600 03/24/19 1924 03/24/19 2149 03/25/19 0454 03/26/19 0504  HGB 10.9*  --   --   --  10.1* 10.9*  HCT 33.9*  --   --   --  31.2* 33.6*  PLT 142*  --   --   --  157 159  HEPARINUNFRC <0.10*  --  0.22*  --  0.33 0.39  CREATININE 4.57*  --   --  4.71* 4.33* 4.23*  TROPONINI 3.78* 4.51*  --  4.16*  --   --     Estimated Creatinine Clearance: 20.2 mL/min (A) (by C-G formula based on SCr of 4.23 mg/dL (H)).  Assessment: Anticoag: Heparin running at 600 units/hr from [place]. HL 0.39 in goal. Hgb 10.9 low. Plts 159.Cardiac catheterization would most definitely worsen renal function, necessitating HD. Has mild blood with blowing his nose, which he notes he had at home.  Goal of Therapy:  Heparin level 0.3-0.7 units/ml Monitor platelets by anticoagulation protocol: Yes   Plan:  Cath 5/4 vs medical management. IV heparin 1400 units/hr Daily HL and CBC  Kamil Hanigan S. Alford Highland, PharmD, BCPS Clinical Staff Pharmacist Eilene Ghazi  Stillinger 03/26/2019,8:10 AM

## 2019-03-26 NOTE — Progress Notes (Signed)
Admit: 03/24/2019 LOS: 2  59M with CKD5, Hyperkalemia, presenting with NSTEMI  Subjective:  . No interval events . For [facility] 5/4 . Good UOP . K stable . He again understands and accepts renal risks including RRT   05/02 0701 - 05/03 0700 In: 3396.8 [P.O.:1160; I.V.:2236.8] Out: 3776 [Urine:3775; Stool:1]  Filed Weights   03/24/19 0654 03/25/19 0440 03/26/19 0450  Weight: 98.2 kg 97.9 kg 96.4 kg    Scheduled Meds: . aspirin EC  81 mg Oral Daily  . atorvastatin  40 mg Oral q1800  . calcitRIOL  0.5 mcg Oral Daily  . gabapentin  200 mg Oral QHS  . insulin aspart  0-9 Units Subcutaneous TID WC  . isosorbide mononitrate  30 mg Oral Daily  . metoprolol succinate  50 mg Oral Daily  . tamsulosin  0.4 mg Oral QAC supper  . zolpidem  5 mg Oral QHS   Continuous Infusions: . heparin 1,400 Units/hr (03/25/19 2015)  .  sodium bicarbonate  infusion 1000 mL 50 mL/hr at 03/26/19 0940   PRN Meds:.acetaminophen, alum & mag hydroxide-simeth, morphine injection, ondansetron (ZOFRAN) IV, polyvinyl alcohol, sodium chloride  Current Labs: reviewed    Physical Exam:  Blood pressure (!) 144/81, pulse 82, temperature 98.5 F (36.9 C), temperature source Oral, resp. rate 18, height 6\' 2"  (1.88 m), weight 96.4 kg, SpO2 96 %. GEN: Well-appearing, no distress ENT: NCAT EYES: EOMI CV: Regular, normal S1 and S2, no murmur or rub PULM: Clear bilaterally, normal work of breathing ABD: Soft, nontender SKIN: No rashes or lesions EXT: No peripheral edema  A 1. CKD5 without uremia 2. Hyperkalemia, resovld with veltassa + bicarbonate 3. NSTEMI for LHC 5/4 4. NAG metabolic acidosis 5. PVD hx/o RAS and Ao-BiFem Bypass 6. 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 Mature RUE BC AVF 8. Mild anemia 9. DM2 10. HTN  P . See original consult note regarding risk of CIN with LHC, potential need for RRT . Cont NaHCO3 gtt over weekend, fluid status permitting . Follow K off Veltassa . Medication Issues; o Preferred narcotic agents  for pain control are hydromorphone, fentanyl, and methadone. Morphine should not be used.  o Baclofen should be avoided o Avoid oral sodium phosphate and magnesium citrate based laxatives / bowel preps    Pearson Grippe MD 03/26/2019, 10:49 AM  Recent Labs  Lab 03/24/19 2149 03/25/19 0454 03/26/19 0504  NA 142 141 142  K 4.8 4.6 4.7  CL 108 110 109  CO2 20* 22 24  GLUCOSE 134* 161* 136*  BUN 63* 63* 61*  CREATININE 4.71* 4.33* 4.23*  CALCIUM 9.6 9.2 9.4   Recent Labs  Lab 03/24/19 0945 03/25/19 0454 03/26/19 0504  WBC 6.9 7.8 7.6  HGB 10.9* 10.1* 10.9*  HCT 33.9* 31.2* 33.6*  MCV 93.4 92.0 92.1  PLT 142* 157 159

## 2019-03-27 ENCOUNTER — Inpatient Hospital Stay (HOSPITAL_COMMUNITY)
Admission: AD | Disposition: A | Discharge status: Home / Self Care | Payer: Self-pay | Source: Other Acute Inpatient Hospital | Attending: Thoracic Surgery (Cardiothoracic Vascular Surgery)

## 2019-03-27 ENCOUNTER — Encounter (HOSPITAL_COMMUNITY): Payer: Self-pay | Admitting: Interventional Cardiology

## 2019-03-27 ENCOUNTER — Inpatient Hospital Stay (HOSPITAL_COMMUNITY): Payer: Medicare Other

## 2019-03-27 DIAGNOSIS — N184 Chronic kidney disease, stage 4 (severe): Secondary | ICD-10-CM

## 2019-03-27 DIAGNOSIS — I2510 Atherosclerotic heart disease of native coronary artery without angina pectoris: Secondary | ICD-10-CM

## 2019-03-27 DIAGNOSIS — E1121 Type 2 diabetes mellitus with diabetic nephropathy: Secondary | ICD-10-CM

## 2019-03-27 DIAGNOSIS — I25110 Atherosclerotic heart disease of native coronary artery with unstable angina pectoris: Secondary | ICD-10-CM

## 2019-03-27 DIAGNOSIS — E782 Mixed hyperlipidemia: Secondary | ICD-10-CM

## 2019-03-27 DIAGNOSIS — Z01810 Encounter for preprocedural cardiovascular examination: Secondary | ICD-10-CM

## 2019-03-27 DIAGNOSIS — I214 Non-ST elevation (NSTEMI) myocardial infarction: Secondary | ICD-10-CM

## 2019-03-27 DIAGNOSIS — I200 Unstable angina: Secondary | ICD-10-CM

## 2019-03-27 HISTORY — PX: LEFT HEART CATH AND CORONARY ANGIOGRAPHY: CATH118249

## 2019-03-27 HISTORY — PX: CORONARY PRESSURE WIRE/FFR WITH 3D MAPPING: CATH118309

## 2019-03-27 LAB — BASIC METABOLIC PANEL
Anion gap: 11 (ref 5–15)
BUN: 59 mg/dL — ABNORMAL HIGH (ref 8–23)
CO2: 26 mmol/L (ref 22–32)
Calcium: 9.5 mg/dL (ref 8.9–10.3)
Chloride: 105 mmol/L (ref 98–111)
Creatinine, Ser: 4.46 mg/dL — ABNORMAL HIGH (ref 0.61–1.24)
GFR calc Af Amer: 15 mL/min — ABNORMAL LOW (ref >60)
GFR calc non Af Amer: 13 mL/min — ABNORMAL LOW (ref >60)
Glucose, Bld: 150 mg/dL — ABNORMAL HIGH (ref 70–99)
Potassium: 4.1 mmol/L (ref 3.5–5.1)
Sodium: 142 mmol/L (ref 135–145)

## 2019-03-27 LAB — GLUCOSE, CAPILLARY
Glucose-Capillary: 143 mg/dL — ABNORMAL HIGH (ref 70–99)
Glucose-Capillary: 138 mg/dL — ABNORMAL HIGH (ref 70–99)
Glucose-Capillary: 140 mg/dL — ABNORMAL HIGH (ref 70–99)
Glucose-Capillary: 144 mg/dL — ABNORMAL HIGH (ref 70–99)
Glucose-Capillary: 169 mg/dL — ABNORMAL HIGH (ref 70–99)

## 2019-03-27 LAB — POCT ACTIVATED CLOTTING TIME
Activated Clotting Time: 202 seconds
Activated Clotting Time: 164 seconds

## 2019-03-27 LAB — CBC
HCT: 33.2 % — ABNORMAL LOW (ref 39.0–52.0)
Hemoglobin: 10.7 g/dL — ABNORMAL LOW (ref 13.0–17.0)
MCH: 29.6 pg (ref 26.0–34.0)
MCHC: 32.2 g/dL (ref 30.0–36.0)
MCV: 92 fL (ref 80.0–100.0)
Platelets: 156 10*3/uL (ref 150–400)
RBC: 3.61 MIL/uL — ABNORMAL LOW (ref 4.22–5.81)
RDW: 13.1 % (ref 11.5–15.5)
WBC: 6 10*3/uL (ref 4.0–10.5)
nRBC: 0 % (ref 0.0–0.2)

## 2019-03-27 LAB — HEPARIN LEVEL (UNFRACTIONATED)
Heparin Unfractionated: 0.29 [IU]/mL — ABNORMAL LOW (ref 0.30–0.70)
Heparin Unfractionated: 0.18 IU/mL — ABNORMAL LOW (ref 0.30–0.70)

## 2019-03-27 SURGERY — LEFT HEART CATH AND CORONARY ANGIOGRAPHY
Anesthesia: LOCAL

## 2019-03-27 MED ORDER — ACETAMINOPHEN 325 MG PO TABS: 650.0000 mg | ORAL_TABLET | ORAL | Status: DC | PRN

## 2019-03-27 MED ORDER — MIDAZOLAM HCL 2 MG/2ML IJ SOLN
INTRAMUSCULAR | Status: AC
  Filled 2019-03-27: qty 2

## 2019-03-27 MED ORDER — MIDAZOLAM HCL 2 MG/2ML IJ SOLN
INTRAMUSCULAR | Status: DC | PRN
  Administered 2019-03-27: 1 mg via INTRAVENOUS
  Administered 2019-03-27: 2 mg via INTRAVENOUS

## 2019-03-27 MED ORDER — HEPARIN (PORCINE) 25000 UT/250ML-% IV SOLN
1500.0000 [IU]/h | INTRAVENOUS | Status: DC
  Administered 2019-03-27: 1400 [IU]/h via INTRAVENOUS
  Administered 2019-03-28 – 2019-03-29 (×2): 1500 [IU]/h via INTRAVENOUS
  Filled 2019-03-27 (×3): qty 250

## 2019-03-27 MED ORDER — SODIUM BICARBONATE 8.4 % IV SOLN: INTRAVENOUS | Status: AC

## 2019-03-27 MED ORDER — HEPARIN SODIUM (PORCINE) 1000 UNIT/ML IJ SOLN
INTRAMUSCULAR | Status: AC
  Filled 2019-03-27: qty 1

## 2019-03-27 MED ORDER — HYDRALAZINE HCL 20 MG/ML IJ SOLN: 10.0000 mg | INTRAMUSCULAR | Status: AC | PRN

## 2019-03-27 MED ORDER — HEPARIN (PORCINE) IN NACL 1000-0.9 UT/500ML-% IV SOLN
INTRAVENOUS | Status: AC
  Filled 2019-03-27: qty 500

## 2019-03-27 MED ORDER — SODIUM BICARBONATE 8.4 % IV SOLN: INTRAVENOUS | Status: DC

## 2019-03-27 MED ORDER — SODIUM CHLORIDE 0.9 % IV SOLN: 250.0000 mL | INTRAVENOUS | Status: DC | PRN

## 2019-03-27 MED ORDER — IOHEXOL 350 MG/ML SOLN
INTRAVENOUS | Status: DC | PRN
  Administered 2019-03-27: 50 mL via INTRA_ARTERIAL

## 2019-03-27 MED ORDER — HEPARIN (PORCINE) IN NACL 1000-0.9 UT/500ML-% IV SOLN
INTRAVENOUS | Status: AC
  Filled 2019-03-27: qty 1000

## 2019-03-27 MED ORDER — HEPARIN SODIUM (PORCINE) 1000 UNIT/ML IJ SOLN
INTRAMUSCULAR | Status: DC | PRN
  Administered 2019-03-27: 9000 [IU] via INTRAVENOUS

## 2019-03-27 MED ORDER — SODIUM CHLORIDE 0.9% FLUSH: 3.0000 mL | INTRAVENOUS | Status: DC | PRN

## 2019-03-27 MED ORDER — FENTANYL CITRATE (PF) 100 MCG/2ML IJ SOLN
INTRAMUSCULAR | Status: DC | PRN
  Administered 2019-03-27 (×2): 25 ug via INTRAVENOUS

## 2019-03-27 MED ORDER — SODIUM CHLORIDE 0.9% FLUSH
3.0000 mL | Freq: Two times a day (BID) | INTRAVENOUS | Status: DC
  Administered 2019-03-27 – 2019-03-28 (×3): 3 mL via INTRAVENOUS

## 2019-03-27 MED ORDER — HEPARIN (PORCINE) IN NACL 1000-0.9 UT/500ML-% IV SOLN
INTRAVENOUS | Status: DC | PRN
  Administered 2019-03-27 (×2): 500 mL

## 2019-03-27 MED ORDER — LIDOCAINE HCL (PF) 1 % IJ SOLN
INTRAMUSCULAR | Status: AC
  Filled 2019-03-27: qty 30

## 2019-03-27 MED ORDER — LIDOCAINE HCL (PF) 1 % IJ SOLN
INTRAMUSCULAR | Status: DC | PRN
  Administered 2019-03-27: 20 mL via INTRADERMAL

## 2019-03-27 MED ORDER — LABETALOL HCL 5 MG/ML IV SOLN: 10.0000 mg | INTRAVENOUS | Status: AC | PRN

## 2019-03-27 MED ORDER — ONDANSETRON HCL 4 MG/2ML IJ SOLN: 4.0000 mg | Freq: Four times a day (QID) | INTRAMUSCULAR | Status: DC | PRN

## 2019-03-27 MED ORDER — FENTANYL CITRATE (PF) 100 MCG/2ML IJ SOLN
INTRAMUSCULAR | Status: AC
  Filled 2019-03-27: qty 2

## 2019-03-27 SURGICAL SUPPLY — 15 items
CATH INFINITI MULTIPACK ST 5F (CATHETERS) ×2 IMPLANT
CATH LAUNCHER 6FR EBU 3.75 (CATHETERS) ×2 IMPLANT
CATH LAUNCHER 6FR EBU3.5 (CATHETERS) ×2 IMPLANT
GUIDEWIRE PRESSURE COMET II (WIRE) ×2 IMPLANT
KIT ENCORE 26 ADVANTAGE (KITS) ×2 IMPLANT
KIT HEART LEFT (KITS) ×2 IMPLANT
KIT HEMO VALVE WATCHDOG (MISCELLANEOUS) ×2 IMPLANT
PACK CARDIAC CATHETERIZATION (CUSTOM PROCEDURE TRAY) ×2 IMPLANT
SHEATH PINNACLE 5F 10CM (SHEATH) ×2 IMPLANT
SHEATH PINNACLE 6F 10CM (SHEATH) ×2 IMPLANT
SHEATH PROBE COVER 6X72 (BAG) ×2 IMPLANT
TRANSDUCER W/STOPCOCK (MISCELLANEOUS) ×2 IMPLANT
TUBING CIL FLEX 10 FLL-RA (TUBING) ×2 IMPLANT
WIRE EMERALD 3MM-J .035X150CM (WIRE) ×2 IMPLANT
WIRE HI TORQ VERSACORE-J 145CM (WIRE) ×2 IMPLANT

## 2019-03-27 NOTE — Plan of Care (Signed)
  Problem: Activity: Goal: Ability to return to baseline activity level will improve Outcome: Completed/Met

## 2019-03-27 NOTE — Progress Notes (Signed)
Site area: Right groin a 6 french arterial sheath was removed  Site Prior to Removal:  Level 1- bruise  Pressure Applied For 20 MINUTES    Bedrest Beginning at 1115am  Manual:   Yes.    Patient Status During Pull:  stable  Post Pull Groin Site:  Level 1- bruise  Post Pull Instructions Given:  Yes.    Post Pull Pulses Present:  Yes.    Dressing Applied:  Yes.    Comments:

## 2019-03-27 NOTE — Progress Notes (Signed)
ANTICOAGULATION CONSULT NOTE - Follow Up Consult  Pharmacy Consult for Heparin Indication: NSTEMI  Allergies  Allergen Reactions  . Penicillins Rash and Other (See Comments)    Has patient had a PCN reaction causing immediate rash, facial/tongue/throat swelling, SOB or lightheadedness with hypotension: Yes Has patient had a PCN reaction causing severe rash involving mucus membranes or skin necrosis: No Has patient had a PCN reaction that required hospitalization: No Has patient had a PCN reaction occurring within the last 10 years: No If all of the above answers are "NO", then may proceed with Cephalosporin use.    Patient Measurements: Height: 6\' 2"  (188 cm) Weight: 212 lb 11.2 oz (96.5 kg) IBW/kg (Calculated) : 82.2 Heparin Dosing Weight: 98.2 kg  Vital Signs: Temp: 98 F (36.7 C) (05/04 1135) Temp Source: Oral (05/04 1135) BP: 143/86 (05/04 1257) Pulse Rate: 81 (05/04 1257)  Labs: Recent Labs    03/24/19 1600  03/24/19 2149  03/25/19 0454 03/26/19 0504 03/27/19 0538  HGB  --   --   --    < > 10.1* 10.9* 10.7*  HCT  --   --   --   --  31.2* 33.6* 33.2*  PLT  --   --   --   --  157 159 156  HEPARINUNFRC  --    < >  --   --  0.33 0.39 0.29*  CREATININE  --    < > 4.71*  --  4.33* 4.23* 4.46*  TROPONINI 4.51*  --  4.16*  --   --   --   --    < > = values in this interval not displayed.    Estimated Creatinine Clearance: 19.2 mL/min (A) (by C-G formula based on SCr of 4.46 mg/dL (H)).  Assessment:  Heparin level was 0.29 on heparin drip 1400 units/hr.  Now s/p cardiac cath today 03/27/19 which revealed 3vCAD.  TCTS consulted.  Pharmacy consulted to restart heparin 4 hr post sheath removal per Dr. Kennon Holter orders.  Sheath pulled at 11:15, RN said small amt blood at site post sheath pull, but it is not worse and is soft, no hematoma.    Goal of Therapy:  Heparin level 0.3-0.7 units/ml Monitor platelets by anticoagulation protocol: Yes   Plan: Restart hep 4h post  sheath pull, pulled at 11:15 Heparin drip restart today at 15:15 at previous rate 1400 units/hr.   Check 6 hr HL Daily HL and CBC F/u TCTS consult/plan.  Nicole Cella, RPh Clinical Pharmacist Pager: [phone] Please check AMION for all [place] phone numbers After 10:00 PM, call [place] [phone] 03/27/2019,1:07 PM

## 2019-03-27 NOTE — Progress Notes (Signed)
ANTICOAGULATION CONSULT NOTE - Follow Up Consult  Pharmacy Consult for heparin Indication: CAD awaiting CABG  Labs: Recent Labs    03/25/19 0454 03/26/19 0504 03/27/19 0538 03/27/19 2154  HGB 10.1* 10.9* 10.7*  --   HCT 31.2* 33.6* 33.2*  --   PLT 157 159 156  --   HEPARINUNFRC 0.33 0.39 0.29* 0.18*  CREATININE 4.33* 4.23* 4.46*  --     Assessment: 67yo male subtherapeutic on heparin after resumed post cath; no gtt issues or signs of bleeding per RN; likely needs more time to accumulate though was slightly subtherapeutic at this rate prior to cath.  Goal of Therapy:  Heparin level 0.3-0.7 units/ml   Plan:  Will increase heparin gtt by 1 units/kg/hr to 1500 units/hr and check level in 8 hours.    Wynona Neat, PharmD, BCPS  03/27/2019,11:19 PM

## 2019-03-27 NOTE — Progress Notes (Signed)
Progress Note  Patient Name: John [name] Date of Encounter: 03/27/2019  Primary Cardiologist: Dr. Al Pimple  Subjective   No chest pain or shortness of breath, status post cardiac catheterization earlier this morning  Inpatient Medications    Scheduled Meds: . aspirin EC  81 mg Oral Daily  . atorvastatin  40 mg Oral q1800  . calcitRIOL  0.5 mcg Oral Daily  . gabapentin  200 mg Oral QHS  . insulin aspart  0-9 Units Subcutaneous TID WC  . isosorbide mononitrate  30 mg Oral Daily  . metoprolol succinate  50 mg Oral Daily  . sodium chloride flush  3 mL Intravenous Q12H  . tamsulosin  0.4 mg Oral QAC supper  . zolpidem  5 mg Oral QHS   Continuous Infusions: . sodium chloride    . sodium chloride 10 mL/hr at 03/26/19 2339  . heparin 1,400 Units/hr (03/26/19 1500)  .  sodium bicarbonate  infusion 1000 mL 50 mL/hr at 03/27/19 0727   PRN Meds: sodium chloride, acetaminophen, alum & mag hydroxide-simeth, morphine injection, ondansetron (ZOFRAN) IV, polyvinyl alcohol, sodium chloride, sodium chloride flush   Vital Signs    Vitals:   03/26/19 0700 03/26/19 1116 03/26/19 2039 03/27/19 0442  BP:  124/71 (!) 146/88 (!) 147/75  Pulse:  75 76 76  Resp:  20 16 18   Temp:  98.7 F (37.1 C) 98.4 F (36.9 C) 98.4 F (36.9 C)  TempSrc:  Oral Oral Oral  SpO2: 96% 97% 98% 93%  Weight:    96.5 kg  Height:        Intake/Output Summary (Last 24 hours) at 03/27/2019 3419 Last data filed at 03/27/2019 0700 Gross per 24 hour  Intake 2637.21 ml  Output 4350 ml  Net -1712.79 ml   Last 3 Weights 03/27/2019 03/26/2019 03/25/2019  Weight (lbs) 212 lb 11.2 oz 212 lb 8 oz 215 lb 14.4 oz  Weight (kg) 96.48 kg 96.389 kg 97.932 kg      Telemetry    Sinus rhythm- Personally Reviewed  ECG    N/a - Personally Reviewed  Physical Exam   GEN: No acute distress.   Neck: No JVD Cardiac: RRR, no murmurs, rubs, or gallops.  Respiratory: Clear to auscultation bilaterally. GI: Soft,  nontender, non-distended  MS: No edema; No deformity. Neuro:  Nonfocal  Psych: Normal affect   Labs    Chemistry Recent Labs  Lab 03/25/19 0454 03/26/19 0504 03/27/19 0538  NA 141 142 142  K 4.6 4.7 4.1  CL 110 109 105  CO2 22 24 26   GLUCOSE 161* 136* 150*  BUN 63* 61* 59*  CREATININE 4.33* 4.23* 4.46*  CALCIUM 9.2 9.4 9.5  GFRNONAA 13* 14* 13*  GFRAA 15* 16* 15*  ANIONGAP 9 9 11      Hematology Recent Labs  Lab 03/25/19 0454 03/26/19 0504 03/27/19 0538  WBC 7.8 7.6 6.0  RBC 3.39* 3.65* 3.61*  HGB 10.1* 10.9* 10.7*  HCT 31.2* 33.6* 33.2*  MCV 92.0 92.1 92.0  MCH 29.8 29.9 29.6  MCHC 32.4 32.4 32.2  RDW 13.2 13.2 13.1  PLT 157 159 156    Cardiac Enzymes Recent Labs  Lab 03/24/19 0945 03/24/19 1600 03/24/19 2149  TROPONINI 3.78* 4.51* 4.16*   No results for input(s): TROPIPOC in the last 168 hours.   BNPNo results for input(s): BNP, PROBNP in the last 168 hours.   DDimer No results for input(s): DDIMER in the last 168 hours.   Radiology  No results found.  Cardiac Studies   TEE: 03/25/19  1. The left ventricle has low normal systolic function, with an ejection fraction of 50-55%. The cavity size was mild to moderately dilated. Left ventricular diastolic Doppler parameters are consistent with impaired relaxation. 2. The right ventricle has normal systolic function. The cavity was normal. There is no increase in right ventricular wall thickness. 3. Left atrial size was moderately dilated. 4. The aortic valve is tricuspid. Mild calcification of the aortic valve. 5. The aortic root is normal in size and structure. 6. The inferior vena cava was dilated in size with >50% respiratory variability.  Patient Profile     68 y.o. male with PMH CAD, PVD, type II diabetes on insulin, stage 5 CKD with AV fistula and undergoing transplant evaluation transferred from [facility] for chest pain and NSTEMI.   Assessment & Plan    1. NSTEMI: Trop peaked at  4.51. Remains on IV heparin -- on ASA, statin, BB, Imdur.  Cardiac catheterization performed Dr. Irish Lack today revealed severe calcified three-vessel disease with surgical anatomy.  Dr. Roxan Hockey is consulting for T CTS.  I would start back on IV heparin pending the decision for revascularization.  2. HL: on high dose statin. LDL 44  3. CKD stage 5: has fistula in place but not currently on HD. Nephrology following. Cr continues around 4.3-4.4.   4. HTN: tolerating BB  5. DM: on SSI. Hgb A1c 7.5  For questions or updates, please contact [place] Please consult [email or web address] for contact info under   Signed, Reino Bellis, NP  03/27/2019, 8:12 AM

## 2019-03-27 NOTE — Consult Note (Signed)
Reason for Consult:3 vessel CAD s/p non-STEMI Referring Physician: Dr. Carmelina Peal is an 68 y.o. male.  HPI: 68 yo man with a history of CAD, MI, prior PCI of RCA, type II diabetes, hypertension, stage IV CKD and PAD (aortobifem bypass) presents with a cc/o chest pressure and left arm numbness.  Was evaluated for a renal transplant at [facility] last winter. Stress test at that time was not concerning. In retrospect for past couple of months has had some mild chest tightness with exertion relieved within minutes by rest. Last Thursday had a more severe episode of chest tightness and numbness of his left arm. Went to ED. No acute ECG changes but Troponin rose from 0.05 to 0.39. He was admitted and has not had any further pain since admission. Echo showed EF of 50-55% and no significant valve pathology. Cath today showed severe 3 vessel CAD.  Remote history of smoking, quit 20 years ago. He has a fistula in place but has not yet started dialysis.  Past Medical History:  Diagnosis Date  . Diabetes mellitus without complication ([place])   . Hypertension   . MI (myocardial infarction) ([place])   . Renal disorder     Past Surgical History:  Procedure Laterality Date  . ABDOMINAL AORTIC ANEURYSM REPAIR    . APPENDECTOMY    . AV FISTULA PLACEMENT Right 10/18/2018   Procedure: BRACHIO-CEPHALIC ARTERIOVENOUS (AV) FISTULA CREATION;  Surgeon: Angelia Mould, MD;  Location: [facility];  Service: Vascular;  Laterality: Right;  . CARDIAC CATHETERIZATION    . CORONARY ANGIOPLASTY    . IR FLUORO GUIDE CV LINE RIGHT  09/13/2017  . IR REMOVAL TUN CV CATH W/O FL  10/22/2017  . IR US GUIDE VASC ACCESS RIGHT  09/13/2017  . NECK SURGERY    . SMALL INTESTINE SURGERY    . TEE WITHOUT CARDIOVERSION N/A 09/10/2017   Procedure: TRANSESOPHAGEAL ECHOCARDIOGRAM (TEE);  Surgeon: Skeet Latch, MD;  Location: [facility] ENDOSCOPY;  Service: Cardiovascular;  Laterality: N/A;    Family History  Problem Relation  Age of Onset  . Cancer Maternal Grandmother     Social History:  reports that he has quit smoking. He has never used smokeless tobacco. He reports current alcohol use. He reports that he does not use drugs.  Allergies:  Allergies  Allergen Reactions  . Penicillins Rash and Other (See Comments)    Has patient had a PCN reaction causing immediate rash, facial/tongue/throat swelling, SOB or lightheadedness with hypotension: Yes Has patient had a PCN reaction causing severe rash involving mucus membranes or skin necrosis: No Has patient had a PCN reaction that required hospitalization: No Has patient had a PCN reaction occurring within the last 10 years: No If all of the above answers are "NO", then may proceed with Cephalosporin use.    Medications:  Scheduled: . aspirin EC  81 mg Oral Daily  . atorvastatin  40 mg Oral q1800  . calcitRIOL  0.5 mcg Oral Daily  . gabapentin  200 mg Oral QHS  . insulin aspart  0-9 Units Subcutaneous TID WC  . isosorbide mononitrate  30 mg Oral Daily  . metoprolol succinate  50 mg Oral Daily  . sodium chloride flush  3 mL Intravenous Q12H  . tamsulosin  0.4 mg Oral QAC supper  . zolpidem  5 mg Oral QHS    Results for orders placed or performed during the hospital encounter of 03/24/19 (from the past 48 hour(s))  Glucose, capillary  Status: Abnormal   Collection Time: 03/25/19  4:19 PM  Result Value Ref Range   Glucose-Capillary 141 (H) 70 - 99 mg/dL   Comment 1 Notify RN    Comment 2 Document in Chart   Glucose, capillary     Status: None   Collection Time: 03/25/19  8:54 PM  Result Value Ref Range   Glucose-Capillary 97 70 - 99 mg/dL   Comment 1 Notify RN    Comment 2 Document in Chart   CBC     Status: Abnormal   Collection Time: 03/26/19  5:04 AM  Result Value Ref Range   WBC 7.6 4.0 - 10.5 K/uL   RBC 3.65 (L) 4.22 - 5.81 MIL/uL   Hemoglobin 10.9 (L) 13.0 - 17.0 g/dL   HCT 33.6 (L) 39.0 - 52.0 %   MCV 92.1 80.0 - 100.0 fL   MCH 29.9  26.0 - 34.0 pg   MCHC 32.4 30.0 - 36.0 g/dL   RDW 13.2 11.5 - 15.5 %   Platelets 159 150 - 400 K/uL   nRBC 0.0 0.0 - 0.2 %    Comment: Performed at [facility], [place] [address] [place], [place] [address]  Heparin level (unfractionated)     Status: None   Collection Time: 03/26/19  5:04 AM  Result Value Ref Range   Heparin Unfractionated 0.39 0.30 - 0.70 IU/mL    Comment: (NOTE) If heparin results are below expected values, and patient dosage has  been confirmed, suggest follow up testing of antithrombin III levels. Performed at [facility], [place] [address], [place], [place] [address]   Basic metabolic panel     Status: Abnormal   Collection Time: 03/26/19  5:04 AM  Result Value Ref Range   Sodium 142 135 - 145 mmol/L   Potassium 4.7 3.5 - 5.1 mmol/L   Chloride 109 98 - 111 mmol/L   CO2 24 22 - 32 mmol/L   Glucose, Bld 136 (H) 70 - 99 mg/dL   BUN 61 (H) 8 - 23 mg/dL   Creatinine, Ser 4.23 (H) 0.61 - 1.24 mg/dL   Calcium 9.4 8.9 - 10.3 mg/dL   GFR calc non Af Amer 14 (L) >60 mL/min   GFR calc Af Amer 16 (L) >60 mL/min   Anion gap 9 5 - 15    Comment: Performed at [facility] [address] [place], [place] [address]  Glucose, capillary     Status: Abnormal   Collection Time: 03/26/19  6:52 AM  Result Value Ref Range   Glucose-Capillary 139 (H) 70 - 99 mg/dL  Glucose, capillary     Status: Abnormal   Collection Time: 03/26/19 11:13 AM  Result Value Ref Range   Glucose-Capillary 142 (H) 70 - 99 mg/dL   Comment 1 Notify RN    Comment 2 Document in Chart   Glucose, capillary     Status: Abnormal   Collection Time: 03/26/19  4:05 PM  Result Value Ref Range   Glucose-Capillary 112 (H) 70 - 99 mg/dL   Comment 1 Notify RN    Comment 2 Document in Chart   Glucose, capillary     Status: Abnormal   Collection Time: 03/26/19  9:42 PM  Result Value Ref Range   Glucose-Capillary 162 (H) 70 - 99 mg/dL  CBC     Status: Abnormal   Collection Time: 03/27/19  5:38  AM  Result Value Ref Range   WBC 6.0 4.0 - 10.5 K/uL  RBC 3.61 (L) 4.22 - 5.81 MIL/uL   Hemoglobin 10.7 (L) 13.0 - 17.0 g/dL   HCT 33.2 (L) 39.0 - 52.0 %   MCV 92.0 80.0 - 100.0 fL   MCH 29.6 26.0 - 34.0 pg   MCHC 32.2 30.0 - 36.0 g/dL   RDW 13.1 11.5 - 15.5 %   Platelets 156 150 - 400 K/uL   nRBC 0.0 0.0 - 0.2 %    Comment: Performed at [facility] [address] [place], [place] [address]  Heparin level (unfractionated)     Status: Abnormal   Collection Time: 03/27/19  5:38 AM  Result Value Ref Range   Heparin Unfractionated 0.29 (L) 0.30 - 0.70 IU/mL    Comment: (NOTE) If heparin results are below expected values, and patient dosage has  been confirmed, suggest follow up testing of antithrombin III levels. Performed at [facility], [place] [address], [place], [place] [address]   Basic metabolic panel     Status: Abnormal   Collection Time: 03/27/19  5:38 AM  Result Value Ref Range   Sodium 142 135 - 145 mmol/L   Potassium 4.1 3.5 - 5.1 mmol/L   Chloride 105 98 - 111 mmol/L   CO2 26 22 - 32 mmol/L   Glucose, Bld 150 (H) 70 - 99 mg/dL   BUN 59 (H) 8 - 23 mg/dL   Creatinine, Ser 4.46 (H) 0.61 - 1.24 mg/dL   Calcium 9.5 8.9 - 10.3 mg/dL   GFR calc non Af Amer 13 (L) >60 mL/min   GFR calc Af Amer 15 (L) >60 mL/min   Anion gap 11 5 - 15    Comment: Performed at [facility], [place] [address], [place], [place] [address]  Glucose, capillary     Status: Abnormal   Collection Time: 03/27/19  6:51 AM  Result Value Ref Range   Glucose-Capillary 143 (H) 70 - 99 mg/dL  Glucose, capillary     Status: Abnormal   Collection Time: 03/27/19 10:40 AM  Result Value Ref Range   Glucose-Capillary 138 (H) 70 - 99 mg/dL  POCT Activated clotting time     Status: None   Collection Time: 03/27/19 10:41 AM  Result Value Ref Range   Activated Clotting Time 164 seconds  Glucose, capillary     Status: Abnormal   Collection Time: 03/27/19 11:46 AM  Result Value Ref Range    Glucose-Capillary 140 (H) 70 - 99 mg/dL    No results found.  Review of Systems  Constitutional: Negative for chills, diaphoresis and fever.  Eyes: Negative for blurred vision and double vision.  Respiratory: Positive for shortness of breath. Negative for cough and wheezing.   Cardiovascular: Positive for chest pain. Negative for orthopnea and claudication.  Gastrointestinal: Negative for nausea and vomiting.  Genitourinary: Positive for frequency. Negative for dysuria.  Neurological: Negative for seizures, loss of consciousness and weakness.   Blood pressure (!) 143/86, pulse 81, temperature 98 F (36.7 C), temperature source Oral, resp. rate 16, height 6\' 2"  (1.88 m), weight 96.5 kg, SpO2 96 %. Physical Exam  Vitals reviewed. Constitutional: He is oriented to person, place, and time. He appears well-developed and well-nourished. No distress.  HENT:  Head: Normocephalic and atraumatic.  Mouth/Throat: No oropharyngeal exudate.  Eyes: Conjunctivae and EOM are normal. No scleral icterus.  Neck: Neck supple. No thyromegaly present.  No bruits  Cardiovascular: Normal rate and regular rhythm. Exam reveals no gallop and no friction rub.  No  murmur heard. Pulses:      Carotid pulses are 2+ on the right side and 2+ on the left side.      Dorsalis pedis pulses are 2+ on the right side and 2+ on the left side.       Posterior tibial pulses are 1+ on the right side and 2+ on the left side.  Respiratory: Breath sounds normal. No respiratory distress. He has no wheezes. He has no rales.  GI: Soft. He exhibits no distension. There is no abdominal tenderness.  Well healed scar  Musculoskeletal:        General: No edema.  Lymphadenopathy:    He has no cervical adenopathy.  Neurological: He is oriented to person, place, and time. No cranial nerve deficit. He exhibits normal muscle tone. Coordination normal.  Skin: Skin is warm and dry.   ECHOCARDIOGRAM IMPRESSIONS    1. The left  ventricle has low normal systolic function, with an ejection fraction of 50-55%. The cavity size was mild to moderately dilated. Left ventricular diastolic Doppler parameters are consistent with impaired relaxation.  2. The right ventricle has normal systolic function. The cavity was normal. There is no increase in right ventricular wall thickness.  3. Left atrial size was moderately dilated.  4. The aortic valve is tricuspid. Mild calcification of the aortic valve.  5. The aortic root is normal in size and structure.  6. The inferior vena cava was dilated in size with >50% respiratory variability.  CARDIAC CATHETERIZATION  Mid Cx lesion is 90% stenosed.  Ost Cx to Prox Cx lesion is 80% stenosed.  Prox LAD to Mid LAD lesion is 75% stenosed. This lesion is hemodynamically significant and had a markedly positive DFR 0.71.  Ost 1st Diag lesion is 70% stenosed.  Prox RCA to Mid RCA lesion is 75% stenosed.  Dist RCA-1 lesion is 40% stenosed. Prior stent with mild instent restenosis.  Dist RCA-2 lesion is 80% stenosed.  LV end diastolic pressure is normal.  There is no aortic valve stenosis.   Severe multivessel disease.  Plan for surgical consult.  Continue IV bicarb per nephrology. I personally reviewed the cath images and concur with the findings noted above  Assessment/Plan: Mr. Keetch is a 68 yo man with multiple CRF and a history of CAD who presented with an unstable chest pain syndrome and ruled in for a non-STEMI. At cath has severe 3 vessel CAD. CABG indicated for survival benefit and relief of symptoms.  I discussed the general nature of the procedure, the need for general anesthesia, the incisions to be used, the use of cardiopulmonary bypass and the use of drainage tubes postoperatively with Mr. Henriques. We discussed the expected hospital stay, overall recovery and short and long term outcomes. I informed him of the indications, risks, benefits and alternatives. He  understands the risks include, but are not limited to death, stroke, MI, DVT/PE, bleeding, possible need for transfusion, infections, cardiac arrhythmias, as well as other organ system dysfunction including respiratory, renal, or GI complications. He understands he is at high risk for acute on chronic renal failure and may end up on hemodialysis.  He accept the risks of CABG and agrees to proceed.  Melrose Nakayama 03/27/2019, 1:38 PM

## 2019-03-27 NOTE — Progress Notes (Signed)
[facility] NEPHROLOGY PROGRESS NOTE  Assessment/ Plan: Pt is a 68 y.o. yo male with CKD stage V admitted with an NSTEMI status post cardiac cath with multivessel disease.  CTS surgery consulted.  #CKD stage V without uremia, due to diabetes and hypertension: Nonoliguric and serum creatinine level 4.46.  Patient received around 50 cc of IV contrast during cath today with PD and post IV fluid.  He has no uremic symptoms and euvolemic on exam.  Monitor lab.  #Hyperkalemia: Improved.  #NSTEMI with multi-vessel disease: status post cardiac cath on 03/27/2019 with three-vessel disease.  CT surgery consulted for possible CABG.  Denies chest pain or shortness of breath.  #Hypertension: Monitor blood pressure.  Continue current medications  #Mild anemia: Monitor hemoglobin.  Subjective: Seen and examined at bedside.  Came off of the cath.  Denied headache, dizziness, nausea, vomiting, chest pain, shortness of breath. Objective Vital signs in last 24 hours: Vitals:   03/27/19 1221 03/27/19 1230 03/27/19 1257 03/27/19 1345  BP: (!) 142/79 137/90 (!) 143/86 134/76  Pulse: 69 71 81 70  Resp:      Temp:      TempSrc:      SpO2:      Weight:      Height:       Weight change: 0.091 kg  Intake/Output Summary (Last 24 hours) at 03/27/2019 1530 Last data filed at 03/27/2019 1431 Gross per 24 hour  Intake 1877.92 ml  Output 3600 ml  Net -1722.08 ml       Labs: Basic Metabolic Panel: Recent Labs  Lab 03/25/19 0454 03/26/19 0504 03/27/19 0538  NA 141 142 142  K 4.6 4.7 4.1  CL 110 109 105  CO2 22 24 26   GLUCOSE 161* 136* 150*  BUN 63* 61* 59*  CREATININE 4.33* 4.23* 4.46*  CALCIUM 9.2 9.4 9.5   Liver Function Tests: No results for input(s): AST, ALT, ALKPHOS, BILITOT, PROT, ALBUMIN in the last 168 hours. No results for input(s): LIPASE, AMYLASE in the last 168 hours. No results for input(s): AMMONIA in the last 168 hours. CBC: Recent Labs  Lab 03/24/19 0945  03/25/19 0454 03/26/19 0504 03/27/19 0538  WBC 6.9 7.8 7.6 6.0  HGB 10.9* 10.1* 10.9* 10.7*  HCT 33.9* 31.2* 33.6* 33.2*  MCV 93.4 92.0 92.1 92.0  PLT 142* 157 159 156   Cardiac Enzymes: Recent Labs  Lab 03/24/19 0945 03/24/19 1600 03/24/19 2149  TROPONINI 3.78* 4.51* 4.16*   CBG: Recent Labs  Lab 03/26/19 1605 03/26/19 2142 03/27/19 0651 03/27/19 1040 03/27/19 1146  GLUCAP 112* 162* 143* 138* 140*    Iron Studies: No results for input(s): IRON, TIBC, TRANSFERRIN, FERRITIN in the last 72 hours. Studies/Results: No results found.  Medications: Infusions: . sodium chloride    . heparin 1,400 Units/hr (03/27/19 1523)    Scheduled Medications: . aspirin EC  81 mg Oral Daily  . atorvastatin  40 mg Oral q1800  . calcitRIOL  0.5 mcg Oral Daily  . gabapentin  200 mg Oral QHS  . insulin aspart  0-9 Units Subcutaneous TID WC  . isosorbide mononitrate  30 mg Oral Daily  . metoprolol succinate  50 mg Oral Daily  . sodium chloride flush  3 mL Intravenous Q12H  . tamsulosin  0.4 mg Oral QAC supper  . zolpidem  5 mg Oral QHS    have reviewed scheduled and prn medications.  Physical Exam: General:NAD, comfortable, able to lie flat Heart:RRR, s1s2 nl Lungs:clear b/l, no crackle  Abdomen:soft, Non-tender, non-distended Extremities:No edema Dialysis Access: Right AV fistula.  Dron Prasad Bhandari 03/27/2019,3:30 PM  LOS: 3 days

## 2019-03-27 NOTE — Progress Notes (Addendum)
PROGRESS NOTE    John [name]  WRU:[id number] DOB: 06/05/1951 DOA: 03/24/2019 PCP: Imagene Riches, NP    Brief Narrative:  68 year old male who presented with chest pain. He does have significant past medical history for hypertension, dyslipidemia, type 2 diabetes mellitus, coronary artery disease status post RCA stenting, chronic kidney disease stage V status post AV fistula, and peripheral vascular disease status post aortobifemoral bypass. He reported acute onset of chest pain, substernal, associated with dyspnea, no radiation. Worsening precordial chest pressure, initially improved with rest, then only with sublingual nitroglycerin.On his initial physical examination his blood pressure was 131/78, heart rate 86, respiratory rate 18, temperature 97.5, oxygen saturation 98%, he had mucous membranes, his lungs were clear to auscultation bilaterally, heart S1-S2 present and rhythmic, abdomen was soft nontender, no lower extremity edema. Right upper extremity fistula.Sodium 142, potassium 4.8, chloride 108, bicarb 20, glucose 134, BUN 63, creatinine 4.71, troponin 4.16, white count 7.8, hemoglobin 10.1, hematocrit 31.2, platelets 157.His EKG had 87 bpm, normal axis normal intervals, sinus rhythm with normal conduction, J-point elevation V1 to V3, no T wave changes.  Patient was admitted to the hospital working diagnosis of atypical chest pain,complicated by troponin elevation, rule out acute coronary syndrome.   Assessment & Plan:   Active Problems:   DM (diabetes mellitus), type 2 with renal complications (HCC)   Essential hypertension   HLD (hyperlipidemia)   Chest pain   CKD (chronic kidney disease), stage V (HCC)   NSTEMI (non-ST elevated myocardial infarction) ([place])   1. CAD with NSTEMI. Echocardiography with preserved LV systolic function and no wall motion abnormalities. Patient has remained chest pain free. Cardiac catheterization this am with multivessel CAD, Mid Cx  90%, Ost Cx to Prox Cx 80%, Prox LAD to Mid LAD 75%, Ost 1st Diag 70%, RCA to mid RCA 75%, Dist RCA (1 lesion) 40% (instent restenosis), Dist RAC (2nd lesion) 80%, plan for CABG. Continue medical therapy with isosorbide, asa, statin and B blockade. No ace inh due to reduced GFR. Continue IV heparin for now.   2. CKD stage V. No signs of hypervolemia. His serum cr has remained stable at 4,46 with K at 4,1 and serum bicarbonate at 26. Plan to continue bicarb drip for 5 H post cath for contrast induced renal injury prophylaxis. Urine output 4,350 ml over last 24 H. Continue calcitriol.   3. HTN. Continue blood pressure control with isosorbide and metoprolol.  4. T2DM. Fasting glucose this am at 150. Continue with insulin sliding scale for glucose cover and monitoring. At home patient on linagliptin, dulaglutide, and glipizide.   5. Dyslipidemia. Continue with  atorvastatin.   DVT prophylaxis:heparin IV Code Status:full Family Communication:no family at the bedside Disposition Plan/ discharge barriers:pending CT surgery evaluation.   Body mass index is 27.31 kg/m. Malnutrition Type:      Malnutrition Characteristics:      Nutrition Interventions:     RN Pressure Injury Documentation:     Consultants:   Cardiology   CT surgery   Nephrology   Procedures:   Cardiac cath 05/04.   Antimicrobials:       Subjective: Patient sp cardiac catheterization, no current chest pain, no nausea or vomiting, no dyspnea.   Objective: Vitals:   03/27/19 1105 03/27/19 1110 03/27/19 1115 03/27/19 1120  BP: (!) 154/78 (!) 159/74 (!) 164/81 (!) 164/77  Pulse: 69 72 73 76  Resp: 14 12 12 13   Temp:      TempSrc:  SpO2: 95% 97% 95% 97%  Weight:      Height:        Intake/Output Summary (Last 24 hours) at 03/27/2019 1138 Last data filed at 03/27/2019 0700 Gross per 24 hour  Intake 2007.88 ml  Output 3350 ml  Net -1342.12 ml   Filed Weights   03/25/19 0440  03/26/19 0450 03/27/19 0442  Weight: 97.9 kg 96.4 kg 96.5 kg    Examination:   General: Not in pain or dyspnea.  Neurology: Awake and alert, non focal  E ENT: no pallor, no icterus, oral mucosa moist Cardiovascular: No JVD. S1-S2 present, rhythmic, no gallops, rubs, or murmurs. No lower extremity edema. Pulmonary: positive breath sounds bilaterally, adequate air movement, no wheezing, rhonchi or rales. Gastrointestinal. Abdomen with no organomegaly, non tender, no rebound or guarding Skin. No rashes Musculoskeletal: no joint deformities     Data Reviewed: I have personally reviewed following labs and imaging studies  CBC: Recent Labs  Lab 03/24/19 0945 03/25/19 0454 03/26/19 0504 03/27/19 0538  WBC 6.9 7.8 7.6 6.0  HGB 10.9* 10.1* 10.9* 10.7*  HCT 33.9* 31.2* 33.6* 33.2*  MCV 93.4 92.0 92.1 92.0  PLT 142* 157 159 644   Basic Metabolic Panel: Recent Labs  Lab 03/24/19 0945 03/24/19 2149 03/25/19 0454 03/26/19 0504 03/27/19 0538  NA 140 142 141 142 142  K 5.7* 4.8 4.6 4.7 4.1  CL 113* 108 110 109 105  CO2 16* 20* 22 24 26   GLUCOSE 199* 134* 161* 136* 150*  BUN 58* 63* 63* 61* 59*  CREATININE 4.57* 4.71* 4.33* 4.23* 4.46*  CALCIUM 9.3 9.6 9.2 9.4 9.5   GFR: Estimated Creatinine Clearance: 19.2 mL/min (A) (by C-G formula based on SCr of 4.46 mg/dL (H)). Liver Function Tests: No results for input(s): AST, ALT, ALKPHOS, BILITOT, PROT, ALBUMIN in the last 168 hours. No results for input(s): LIPASE, AMYLASE in the last 168 hours. No results for input(s): AMMONIA in the last 168 hours. Coagulation Profile: No results for input(s): INR, PROTIME in the last 168 hours. Cardiac Enzymes: Recent Labs  Lab 03/24/19 0945 03/24/19 1600 03/24/19 2149  TROPONINI 3.78* 4.51* 4.16*   BNP (last 3 results) No results for input(s): PROBNP in the last 8760 hours. HbA1C: Recent Labs    03/25/19 0454  HGBA1C 7.5*   CBG: Recent Labs  Lab 03/26/19 1113 03/26/19 1605  03/26/19 2142 03/27/19 0651 03/27/19 1040  GLUCAP 142* 112* 162* 143* 138*   Lipid Profile: Recent Labs    03/25/19 0454  CHOL 99  HDL 39*  LDLCALC 44  TRIG 80  CHOLHDL 2.5   Thyroid Function Tests: No results for input(s): TSH, T4TOTAL, FREET4, T3FREE, THYROIDAB in the last 72 hours. Anemia Panel: No results for input(s): VITAMINB12, FOLATE, FERRITIN, TIBC, IRON, RETICCTPCT in the last 72 hours.    Radiology Studies: I have reviewed all of the imaging during this hospital visit personally     Scheduled Meds: . aspirin EC  81 mg Oral Daily  . atorvastatin  40 mg Oral q1800  . calcitRIOL  0.5 mcg Oral Daily  . gabapentin  200 mg Oral QHS  . insulin aspart  0-9 Units Subcutaneous TID WC  . isosorbide mononitrate  30 mg Oral Daily  . metoprolol succinate  50 mg Oral Daily  . tamsulosin  0.4 mg Oral QAC supper  . zolpidem  5 mg Oral QHS   Continuous Infusions: . heparin 1,400 Units/hr (03/26/19 1500)  .  sodium bicarbonate  infusion 1000  mL       LOS: 3 days        Gizzelle Lacomb Gerome Apley, MD

## 2019-03-27 NOTE — Interval H&P Note (Signed)
Cath Lab Visit (complete for each Cath Lab visit)  Clinical Evaluation Leading to the Procedure:   ACS: Yes.    Non-ACS:    Anginal Classification: CCS IV  Anti-ischemic medical therapy: Minimal Therapy (1 class of medications)  Non-Invasive Test Results: No non-invasive testing performed  Prior CABG: No previous CABG      History and Physical Interval Note:  03/27/2019 8:25 AM  John [name]  has presented today for surgery, with the diagnosis of NSTEMI.  The various methods of treatment have been discussed with the patient and family. After consideration of risks, benefits and other options for treatment, the patient has consented to  Procedure(s): LEFT HEART CATH AND CORONARY ANGIOGRAPHY (N/A) as a surgical intervention.  The patient's history has been reviewed, patient examined, no change in status, stable for surgery.  I have reviewed the patient's chart and labs.  Questions were answered to the patient's satisfaction.     Larae Grooms

## 2019-03-27 NOTE — Progress Notes (Signed)
Received post Ca Cath, Right Femoral site bruise level 1 , puffy and soft. Will monitor accordingly.

## 2019-03-27 NOTE — Progress Notes (Signed)
PRE CABG has been completed.   Preliminary results in CV Proc.   Abram Sander 03/27/2019 3:36 PM

## 2019-03-27 NOTE — H&P (View-Only) (Signed)
Reason for Consult:3 vessel CAD s/p non-STEMI Referring Physician: Dr. Carmelina Peal is an 68 y.o. male.  HPI: 68 yo man with a history of CAD, MI, prior PCI of RCA, type II diabetes, hypertension, stage IV CKD and PAD (aortobifem bypass) presents with a cc/o chest pressure and left arm numbness.  Was evaluated for a renal transplant at [facility] last winter. Stress test at that time was not concerning. In retrospect for past couple of months has had some mild chest tightness with exertion relieved within minutes by rest. Last Thursday had a more severe episode of chest tightness and numbness of his left arm. Went to ED. No acute ECG changes but Troponin rose from 0.05 to 0.39. He was admitted and has not had any further pain since admission. Echo showed EF of 50-55% and no significant valve pathology. Cath today showed severe 3 vessel CAD.  Remote history of smoking, quit 20 years ago. He has a fistula in place but has not yet started dialysis.  Past Medical History:  Diagnosis Date  . Diabetes mellitus without complication ([place])   . Hypertension   . MI (myocardial infarction) ([place])   . Renal disorder     Past Surgical History:  Procedure Laterality Date  . ABDOMINAL AORTIC ANEURYSM REPAIR    . APPENDECTOMY    . AV FISTULA PLACEMENT Right 10/18/2018   Procedure: BRACHIO-CEPHALIC ARTERIOVENOUS (AV) FISTULA CREATION;  Surgeon: Angelia Mould, MD;  Location: [facility];  Service: Vascular;  Laterality: Right;  . CARDIAC CATHETERIZATION    . CORONARY ANGIOPLASTY    . IR FLUORO GUIDE CV LINE RIGHT  09/13/2017  . IR REMOVAL TUN CV CATH W/O FL  10/22/2017  . IR US GUIDE VASC ACCESS RIGHT  09/13/2017  . NECK SURGERY    . SMALL INTESTINE SURGERY    . TEE WITHOUT CARDIOVERSION N/A 09/10/2017   Procedure: TRANSESOPHAGEAL ECHOCARDIOGRAM (TEE);  Surgeon: Skeet Latch, MD;  Location: [facility] ENDOSCOPY;  Service: Cardiovascular;  Laterality: N/A;    Family History  Problem Relation  Age of Onset  . Cancer Maternal Grandmother     Social History:  reports that he has quit smoking. He has never used smokeless tobacco. He reports current alcohol use. He reports that he does not use drugs.  Allergies:  Allergies  Allergen Reactions  . Penicillins Rash and Other (See Comments)    Has patient had a PCN reaction causing immediate rash, facial/tongue/throat swelling, SOB or lightheadedness with hypotension: Yes Has patient had a PCN reaction causing severe rash involving mucus membranes or skin necrosis: No Has patient had a PCN reaction that required hospitalization: No Has patient had a PCN reaction occurring within the last 10 years: No If all of the above answers are "NO", then may proceed with Cephalosporin use.    Medications:  Scheduled: . aspirin EC  81 mg Oral Daily  . atorvastatin  40 mg Oral q1800  . calcitRIOL  0.5 mcg Oral Daily  . gabapentin  200 mg Oral QHS  . insulin aspart  0-9 Units Subcutaneous TID WC  . isosorbide mononitrate  30 mg Oral Daily  . metoprolol succinate  50 mg Oral Daily  . sodium chloride flush  3 mL Intravenous Q12H  . tamsulosin  0.4 mg Oral QAC supper  . zolpidem  5 mg Oral QHS    Results for orders placed or performed during the hospital encounter of 03/24/19 (from the past 48 hour(s))  Glucose, capillary  Status: Abnormal   Collection Time: 03/25/19  4:19 PM  Result Value Ref Range   Glucose-Capillary 141 (H) 70 - 99 mg/dL   Comment 1 Notify RN    Comment 2 Document in Chart   Glucose, capillary     Status: None   Collection Time: 03/25/19  8:54 PM  Result Value Ref Range   Glucose-Capillary 97 70 - 99 mg/dL   Comment 1 Notify RN    Comment 2 Document in Chart   CBC     Status: Abnormal   Collection Time: 03/26/19  5:04 AM  Result Value Ref Range   WBC 7.6 4.0 - 10.5 K/uL   RBC 3.65 (L) 4.22 - 5.81 MIL/uL   Hemoglobin 10.9 (L) 13.0 - 17.0 g/dL   HCT 33.6 (L) 39.0 - 52.0 %   MCV 92.1 80.0 - 100.0 fL   MCH 29.9  26.0 - 34.0 pg   MCHC 32.4 30.0 - 36.0 g/dL   RDW 13.2 11.5 - 15.5 %   Platelets 159 150 - 400 K/uL   nRBC 0.0 0.0 - 0.2 %    Comment: Performed at [facility], [place] [address], [place], [place] [address]  Heparin level (unfractionated)     Status: None   Collection Time: 03/26/19  5:04 AM  Result Value Ref Range   Heparin Unfractionated 0.39 0.30 - 0.70 IU/mL    Comment: (NOTE) If heparin results are below expected values, and patient dosage has  been confirmed, suggest follow up testing of antithrombin III levels. Performed at [facility], [place] [address] [place], [place] [address]   Basic metabolic panel     Status: Abnormal   Collection Time: 03/26/19  5:04 AM  Result Value Ref Range   Sodium 142 135 - 145 mmol/L   Potassium 4.7 3.5 - 5.1 mmol/L   Chloride 109 98 - 111 mmol/L   CO2 24 22 - 32 mmol/L   Glucose, Bld 136 (H) 70 - 99 mg/dL   BUN 61 (H) 8 - 23 mg/dL   Creatinine, Ser 4.23 (H) 0.61 - 1.24 mg/dL   Calcium 9.4 8.9 - 10.3 mg/dL   GFR calc non Af Amer 14 (L) >60 mL/min   GFR calc Af Amer 16 (L) >60 mL/min   Anion gap 9 5 - 15    Comment: Performed at [facility] [address], [place], [place] [address]  Glucose, capillary     Status: Abnormal   Collection Time: 03/26/19  6:52 AM  Result Value Ref Range   Glucose-Capillary 139 (H) 70 - 99 mg/dL  Glucose, capillary     Status: Abnormal   Collection Time: 03/26/19 11:13 AM  Result Value Ref Range   Glucose-Capillary 142 (H) 70 - 99 mg/dL   Comment 1 Notify RN    Comment 2 Document in Chart   Glucose, capillary     Status: Abnormal   Collection Time: 03/26/19  4:05 PM  Result Value Ref Range   Glucose-Capillary 112 (H) 70 - 99 mg/dL   Comment 1 Notify RN    Comment 2 Document in Chart   Glucose, capillary     Status: Abnormal   Collection Time: 03/26/19  9:42 PM  Result Value Ref Range   Glucose-Capillary 162 (H) 70 - 99 mg/dL  CBC     Status: Abnormal   Collection Time: 03/27/19  5:38  AM  Result Value Ref Range   WBC 6.0 4.0 - 10.5 K/uL  RBC 3.61 (L) 4.22 - 5.81 MIL/uL   Hemoglobin 10.7 (L) 13.0 - 17.0 g/dL   HCT 33.2 (L) 39.0 - 52.0 %   MCV 92.0 80.0 - 100.0 fL   MCH 29.6 26.0 - 34.0 pg   MCHC 32.2 30.0 - 36.0 g/dL   RDW 13.1 11.5 - 15.5 %   Platelets 156 150 - 400 K/uL   nRBC 0.0 0.0 - 0.2 %    Comment: Performed at [facility] [address] [place], [place] [address]  Heparin level (unfractionated)     Status: Abnormal   Collection Time: 03/27/19  5:38 AM  Result Value Ref Range   Heparin Unfractionated 0.29 (L) 0.30 - 0.70 IU/mL    Comment: (NOTE) If heparin results are below expected values, and patient dosage has  been confirmed, suggest follow up testing of antithrombin III levels. Performed at [facility], [place] [address] [place], [place] [address]   Basic metabolic panel     Status: Abnormal   Collection Time: 03/27/19  5:38 AM  Result Value Ref Range   Sodium 142 135 - 145 mmol/L   Potassium 4.1 3.5 - 5.1 mmol/L   Chloride 105 98 - 111 mmol/L   CO2 26 22 - 32 mmol/L   Glucose, Bld 150 (H) 70 - 99 mg/dL   BUN 59 (H) 8 - 23 mg/dL   Creatinine, Ser 4.46 (H) 0.61 - 1.24 mg/dL   Calcium 9.5 8.9 - 10.3 mg/dL   GFR calc non Af Amer 13 (L) >60 mL/min   GFR calc Af Amer 15 (L) >60 mL/min   Anion gap 11 5 - 15    Comment: Performed at [facility], [place] [address] [place], [place] [address]  Glucose, capillary     Status: Abnormal   Collection Time: 03/27/19  6:51 AM  Result Value Ref Range   Glucose-Capillary 143 (H) 70 - 99 mg/dL  Glucose, capillary     Status: Abnormal   Collection Time: 03/27/19 10:40 AM  Result Value Ref Range   Glucose-Capillary 138 (H) 70 - 99 mg/dL  POCT Activated clotting time     Status: None   Collection Time: 03/27/19 10:41 AM  Result Value Ref Range   Activated Clotting Time 164 seconds  Glucose, capillary     Status: Abnormal   Collection Time: 03/27/19 11:46 AM  Result Value Ref Range    Glucose-Capillary 140 (H) 70 - 99 mg/dL    No results found.  Review of Systems  Constitutional: Negative for chills, diaphoresis and fever.  Eyes: Negative for blurred vision and double vision.  Respiratory: Positive for shortness of breath. Negative for cough and wheezing.   Cardiovascular: Positive for chest pain. Negative for orthopnea and claudication.  Gastrointestinal: Negative for nausea and vomiting.  Genitourinary: Positive for frequency. Negative for dysuria.  Neurological: Negative for seizures, loss of consciousness and weakness.   Blood pressure (!) 143/86, pulse 81, temperature 98 F (36.7 C), temperature source Oral, resp. rate 16, height 6\' 2"  (1.88 m), weight 96.5 kg, SpO2 96 %. Physical Exam  Vitals reviewed. Constitutional: He is oriented to person, place, and time. He appears well-developed and well-nourished. No distress.  HENT:  Head: Normocephalic and atraumatic.  Mouth/Throat: No oropharyngeal exudate.  Eyes: Conjunctivae and EOM are normal. No scleral icterus.  Neck: Neck supple. No thyromegaly present.  No bruits  Cardiovascular: Normal rate and regular rhythm. Exam reveals no gallop and no friction rub.  No  murmur heard. Pulses:      Carotid pulses are 2+ on the right side and 2+ on the left side.      Dorsalis pedis pulses are 2+ on the right side and 2+ on the left side.       Posterior tibial pulses are 1+ on the right side and 2+ on the left side.  Respiratory: Breath sounds normal. No respiratory distress. He has no wheezes. He has no rales.  GI: Soft. He exhibits no distension. There is no abdominal tenderness.  Well healed scar  Musculoskeletal:        General: No edema.  Lymphadenopathy:    He has no cervical adenopathy.  Neurological: He is oriented to person, place, and time. No cranial nerve deficit. He exhibits normal muscle tone. Coordination normal.  Skin: Skin is warm and dry.   ECHOCARDIOGRAM IMPRESSIONS    1. The left  ventricle has low normal systolic function, with an ejection fraction of 50-55%. The cavity size was mild to moderately dilated. Left ventricular diastolic Doppler parameters are consistent with impaired relaxation.  2. The right ventricle has normal systolic function. The cavity was normal. There is no increase in right ventricular wall thickness.  3. Left atrial size was moderately dilated.  4. The aortic valve is tricuspid. Mild calcification of the aortic valve.  5. The aortic root is normal in size and structure.  6. The inferior vena cava was dilated in size with >50% respiratory variability.  CARDIAC CATHETERIZATION  Mid Cx lesion is 90% stenosed.  Ost Cx to Prox Cx lesion is 80% stenosed.  Prox LAD to Mid LAD lesion is 75% stenosed. This lesion is hemodynamically significant and had a markedly positive DFR 0.71.  Ost 1st Diag lesion is 70% stenosed.  Prox RCA to Mid RCA lesion is 75% stenosed.  Dist RCA-1 lesion is 40% stenosed. Prior stent with mild instent restenosis.  Dist RCA-2 lesion is 80% stenosed.  LV end diastolic pressure is normal.  There is no aortic valve stenosis.   Severe multivessel disease.  Plan for surgical consult.  Continue IV bicarb per nephrology. I personally reviewed the cath images and concur with the findings noted above  Assessment/Plan: Mr. Wetherington is a 68 yo man with multiple CRF and a history of CAD who presented with an unstable chest pain syndrome and ruled in for a non-STEMI. At cath has severe 3 vessel CAD. CABG indicated for survival benefit and relief of symptoms.  I discussed the general nature of the procedure, the need for general anesthesia, the incisions to be used, the use of cardiopulmonary bypass and the use of drainage tubes postoperatively with Mr. Mummert. We discussed the expected hospital stay, overall recovery and short and long term outcomes. I informed him of the indications, risks, benefits and alternatives. He  understands the risks include, but are not limited to death, stroke, MI, DVT/PE, bleeding, possible need for transfusion, infections, cardiac arrhythmias, as well as other organ system dysfunction including respiratory, renal, or GI complications. He understands he is at high risk for acute on chronic renal failure and may end up on hemodialysis.  He accept the risks of CABG and agrees to proceed.  Melrose Nakayama 03/27/2019, 1:38 PM

## 2019-03-27 NOTE — Care Management Important Message (Signed)
Important Message  Patient Details  Name: John [name] MRN: [id number] Date of Birth: 1951/01/29   Medicare Important Message Given:  Yes    [name] 03/27/2019, 3:35 PM

## 2019-03-28 DIAGNOSIS — I25110 Atherosclerotic heart disease of native coronary artery with unstable angina pectoris: Secondary | ICD-10-CM

## 2019-03-28 LAB — URINALYSIS, ROUTINE W REFLEX MICROSCOPIC
Bacteria, UA: NONE SEEN
Bilirubin Urine: NEGATIVE
Glucose, UA: >=500 mg/dL — AB
Hgb urine dipstick: NEGATIVE
Ketones, ur: NEGATIVE mg/dL
Leukocytes,Ua: NEGATIVE
Nitrite: NEGATIVE
Protein, ur: 100 mg/dL — AB
Specific Gravity, Urine: 1.01 (ref 1.005–1.030)
pH: 7 (ref 5.0–8.0)

## 2019-03-28 LAB — ABO/RH: ABO/RH(D): A POS

## 2019-03-28 LAB — BASIC METABOLIC PANEL
Anion gap: 13 (ref 5–15)
BUN: 56 mg/dL — ABNORMAL HIGH (ref 8–23)
CO2: 23 mmol/L (ref 22–32)
Calcium: 9.8 mg/dL (ref 8.9–10.3)
Chloride: 105 mmol/L (ref 98–111)
Creatinine, Ser: 4.67 mg/dL — ABNORMAL HIGH (ref 0.61–1.24)
GFR calc Af Amer: 14 mL/min — ABNORMAL LOW (ref >60)
GFR calc non Af Amer: 12 mL/min — ABNORMAL LOW (ref >60)
Glucose, Bld: 147 mg/dL — ABNORMAL HIGH (ref 70–99)
Potassium: 4.6 mmol/L (ref 3.5–5.1)
Sodium: 141 mmol/L (ref 135–145)

## 2019-03-28 LAB — GLUCOSE, CAPILLARY
Glucose-Capillary: 128 mg/dL — ABNORMAL HIGH (ref 70–99)
Glucose-Capillary: 143 mg/dL — ABNORMAL HIGH (ref 70–99)
Glucose-Capillary: 115 mg/dL — ABNORMAL HIGH (ref 70–99)
Glucose-Capillary: 154 mg/dL — ABNORMAL HIGH (ref 70–99)

## 2019-03-28 LAB — CBC
HCT: 35.9 % — ABNORMAL LOW (ref 39.0–52.0)
Hemoglobin: 11.6 g/dL — ABNORMAL LOW (ref 13.0–17.0)
MCH: 29.9 pg (ref 26.0–34.0)
MCHC: 32.3 g/dL (ref 30.0–36.0)
MCV: 92.5 fL (ref 80.0–100.0)
Platelets: 167 10*3/uL (ref 150–400)
RBC: 3.88 MIL/uL — ABNORMAL LOW (ref 4.22–5.81)
RDW: 13.1 % (ref 11.5–15.5)
WBC: 7.6 10*3/uL (ref 4.0–10.5)
nRBC: 0 % (ref 0.0–0.2)

## 2019-03-28 LAB — HEPARIN LEVEL (UNFRACTIONATED): Heparin Unfractionated: 0.34 IU/mL (ref 0.30–0.70)

## 2019-03-28 LAB — PROTIME-INR
INR: 1.1 (ref 0.8–1.2)
Prothrombin Time: 14 seconds (ref 11.4–15.2)

## 2019-03-28 MED ORDER — CHLORHEXIDINE GLUCONATE 0.12 % MT SOLN
15.0000 mL | Freq: Once | OROMUCOSAL | Status: AC
  Administered 2019-03-29: 15 mL via OROMUCOSAL
  Filled 2019-03-28: qty 15

## 2019-03-28 MED ORDER — VANCOMYCIN HCL 10 G IV SOLR
1500.0000 mg | INTRAVENOUS | Status: AC
  Administered 2019-03-29: 1500 mg via INTRAVENOUS
  Filled 2019-03-28: qty 1500

## 2019-03-28 MED ORDER — MILRINONE LACTATE IN DEXTROSE 20-5 MG/100ML-% IV SOLN
0.3000 ug/kg/min | INTRAVENOUS | Status: DC
  Filled 2019-03-28: qty 100

## 2019-03-28 MED ORDER — CHLORHEXIDINE GLUCONATE CLOTH 2 % EX PADS
6.0000 | MEDICATED_PAD | Freq: Once | CUTANEOUS | Status: AC
  Administered 2019-03-29: 6 via TOPICAL

## 2019-03-28 MED ORDER — VANCOMYCIN HCL 10 G IV SOLR
1250.0000 mg | INTRAVENOUS | Status: DC
  Filled 2019-03-28 (×2): qty 1250

## 2019-03-28 MED ORDER — INSULIN REGULAR(HUMAN) IN NACL 100-0.9 UT/100ML-% IV SOLN
INTRAVENOUS | Status: DC
  Filled 2019-03-28: qty 100

## 2019-03-28 MED ORDER — DEXMEDETOMIDINE HCL IN NACL 400 MCG/100ML IV SOLN
0.1000 ug/kg/h | INTRAVENOUS | Status: DC
  Filled 2019-03-28: qty 100

## 2019-03-28 MED ORDER — DOPAMINE-DEXTROSE 3.2-5 MG/ML-% IV SOLN
0.0000 ug/kg/min | INTRAVENOUS | Status: DC
  Filled 2019-03-28: qty 250

## 2019-03-28 MED ORDER — TRANEXAMIC ACID 1000 MG/10ML IV SOLN
1.5000 mg/kg/h | INTRAVENOUS | Status: DC
  Filled 2019-03-28: qty 25

## 2019-03-28 MED ORDER — METOPROLOL TARTRATE 12.5 MG HALF TABLET
12.5000 mg | ORAL_TABLET | Freq: Once | ORAL | Status: AC
  Administered 2019-03-29: 12.5 mg via ORAL
  Filled 2019-03-28: qty 1

## 2019-03-28 MED ORDER — PHENYLEPHRINE HCL-NACL 20-0.9 MG/250ML-% IV SOLN
30.0000 ug/min | INTRAVENOUS | Status: DC
  Filled 2019-03-28: qty 250

## 2019-03-28 MED ORDER — PLASMA-LYTE 148 IV SOLN
INTRAVENOUS | Status: DC
  Filled 2019-03-28: qty 2.5

## 2019-03-28 MED ORDER — POTASSIUM CHLORIDE 2 MEQ/ML IV SOLN
80.0000 meq | INTRAVENOUS | Status: DC
  Filled 2019-03-28 (×2): qty 40

## 2019-03-28 MED ORDER — MAGNESIUM SULFATE 50 % IJ SOLN
40.0000 meq | INTRAMUSCULAR | Status: DC
  Filled 2019-03-28: qty 9.85

## 2019-03-28 MED ORDER — TEMAZEPAM 15 MG PO CAPS: 15.0000 mg | ORAL_CAPSULE | Freq: Once | ORAL | Status: DC | PRN

## 2019-03-28 MED ORDER — BISACODYL 5 MG PO TBEC
5.0000 mg | DELAYED_RELEASE_TABLET | Freq: Once | ORAL | Status: AC
  Administered 2019-03-28: 5 mg via ORAL
  Filled 2019-03-28: qty 1

## 2019-03-28 MED ORDER — SODIUM CHLORIDE 0.9 % IV SOLN
INTRAVENOUS | Status: DC
  Filled 2019-03-28: qty 30

## 2019-03-28 MED ORDER — NOREPINEPHRINE 4 MG/250ML-% IV SOLN
0.0000 ug/min | INTRAVENOUS | Status: DC
  Filled 2019-03-28: qty 250

## 2019-03-28 MED ORDER — NITROGLYCERIN IN D5W 200-5 MCG/ML-% IV SOLN
2.0000 ug/min | INTRAVENOUS | Status: DC
  Filled 2019-03-28: qty 250

## 2019-03-28 MED ORDER — LEVOFLOXACIN IN D5W 500 MG/100ML IV SOLN
500.0000 mg | INTRAVENOUS | Status: AC
  Administered 2019-03-29: 500 mg via INTRAVENOUS
  Filled 2019-03-28: qty 100

## 2019-03-28 MED ORDER — TRANEXAMIC ACID (OHS) PUMP PRIME SOLUTION
2.0000 mg/kg | INTRAVENOUS | Status: DC
  Filled 2019-03-28: qty 1.9

## 2019-03-28 MED ORDER — CHLORHEXIDINE GLUCONATE CLOTH 2 % EX PADS
6.0000 | MEDICATED_PAD | Freq: Once | CUTANEOUS | Status: AC
  Administered 2019-03-28: 21:00:00 6 via TOPICAL

## 2019-03-28 MED ORDER — DIAZEPAM 2 MG PO TABS
2.0000 mg | ORAL_TABLET | Freq: Once | ORAL | Status: AC
  Administered 2019-03-29: 2 mg via ORAL
  Filled 2019-03-28: qty 1

## 2019-03-28 MED ORDER — TRANEXAMIC ACID (OHS) BOLUS VIA INFUSION
15.0000 mg/kg | INTRAVENOUS | Status: AC
  Administered 2019-03-29: 1422 mg via INTRAVENOUS
  Filled 2019-03-28: qty 1422

## 2019-03-28 MED ORDER — EPINEPHRINE PF 1 MG/ML IJ SOLN
0.0000 ug/min | INTRAVENOUS | Status: DC
  Filled 2019-03-28: qty 4

## 2019-03-28 MED FILL — Heparin Sod (Porcine)-NaCl IV Soln 1000 Unit/500ML-0.9%: INTRAVENOUS | Qty: 500 | Status: AC

## 2019-03-28 NOTE — Anesthesia Preprocedure Evaluation (Addendum)
Anesthesia Evaluation  Patient identified by MRN, date of birth, ID band Patient awake    Reviewed: Allergy & Precautions, H&P , NPO status , Patient's Chart, lab work & pertinent test results, reviewed documented beta blocker date and time   Airway Mallampati: II  TM Distance: >3 FB Neck ROM: Full    Dental no notable dental hx. (+) Teeth Intact, Dental Advisory Given   Pulmonary former smoker,    Pulmonary exam normal        Cardiovascular hypertension, Pt. on medications and Pt. on home beta blockers + CAD, + Past MI and + Cardiac Stents  Normal cardiovascular exam  IMPRESSIONS    1. The left ventricle has low normal systolic function, with an ejection fraction of 50-55%. The cavity size was mild to moderately dilated. Left ventricular diastolic Doppler parameters are consistent with impaired relaxation.  2. The right ventricle has normal systolic function. The cavity was normal. There is no increase in right ventricular wall thickness.  3. Left atrial size was moderately dilated.  4. The aortic valve is tricuspid. Mild calcification of the aortic valve.  5. The aortic root is normal in size and structure.  6. The inferior vena cava was dilated in size with >50% respiratory variability. Addendum   Mid Cx lesion is 90% stenosed.  Ost Cx to Prox Cx lesion is 80% stenosed.  Prox LAD to Mid LAD lesion is 75% stenosed. This lesion is hemodynamically significant and had a markedly positive DFR 0.71.  Ost 1st Diag lesion is 70% stenosed.  Prox RCA to Mid RCA lesion is 75% stenosed.  Dist RCA-1 lesion is 40% stenosed. Prior stent with mild instent restenosis.  Dist RCA-2 lesion is 80% stenosed.  LV end diastolic pressure is normal.  There is no aortic valve stenosis.  Severe multivessel disease. Plan for surgical consult.  Continue IV bicarb per nephrology.     Neuro/Psych negative neurological ROS  negative  psych ROS   GI/Hepatic negative GI ROS, Neg liver ROS,   Endo/Other  diabetes, Insulin Dependent, Oral Hypoglycemic Agents  Renal/GU CRFRenal disease  negative genitourinary   Musculoskeletal   Abdominal   Peds  Hematology negative hematology ROS (+)   Anesthesia Other Findings   Reproductive/Obstetrics negative OB ROS                            Anesthesia Physical  Anesthesia Plan  ASA: IV  Anesthesia Plan: General   Post-op Pain Management:    Induction: Intravenous  PONV Risk Score and Plan: 2 and 3 and Ondansetron, Dexamethasone and Diphenhydramine  Airway Management Planned: Oral ETT  Additional Equipment: Arterial line, PA Cath, 3D TEE and Ultrasound Guidance Line Placement  Intra-op Plan:   Post-operative Plan: Post-operative intubation/ventilation  Informed Consent: I have reviewed the patients History and Physical, chart, labs and discussed the procedure including the risks, benefits and alternatives for the proposed anesthesia with the patient or authorized representative who has indicated his/her understanding and acceptance.     Dental advisory given  Plan Discussed with: CRNA and Anesthesiologist  Anesthesia Plan Comments:        Anesthesia Quick Evaluation

## 2019-03-28 NOTE — Progress Notes (Signed)
[facility] NEPHROLOGY PROGRESS NOTE  Assessment/ Plan: Pt is a 68 y.o. yo male with CKD stage V admitted with an NSTEMI status post cardiac cath with multivessel disease.  CTS surgery consulted.  #CKD stage V without uremia, due to diabetes and hypertension: Nonoliguric and serum creatinine level mildy trended up to 4/67.  Patient received around 50 cc of IV contrast during cath yesterday with IVF pre and post-procedure.  He has no uremic symptoms and euvolemic on exam.  Monitor lab. -Right aVF is mature  #Hyperkalemia: Improved.  #NSTEMI with multi-vessel disease: status post cardiac cath on 03/27/2019 with three-vessel disease.  CT surgery consulted and plan for CABG tomorrow.  Denies chest pain or shortness of breath.  #Hypertension: Monitor blood pressure.  Continue current medications  #Mild anemia: Monitor hemoglobin.  Subjective: Seen and examined at bedside.  Denied nausea, vomiting, chest pain, shortness of breath.  No urinary complaint.  Objective Vital signs in last 24 hours: Vitals:   03/27/19 1943 03/28/19 0502 03/28/19 0826 03/28/19 1235  BP: (!) 147/75 (!) 144/85 128/83 123/69  Pulse: 79 79 75 72  Resp: 18 18  17   Temp: 99 F (37.2 C) 98.3 F (36.8 C)    TempSrc: Oral Oral    SpO2: 100% 95%  96%  Weight:  94.8 kg    Height:       Weight change: -1.724 kg  Intake/Output Summary (Last 24 hours) at 03/28/2019 1415 Last data filed at 03/28/2019 1237 Gross per 24 hour  Intake 925 ml  Output 2600 ml  Net -1675 ml       Labs: Basic Metabolic Panel: Recent Labs  Lab 03/26/19 0504 03/27/19 0538 03/28/19 0701  NA 142 142 141  K 4.7 4.1 4.6  CL 109 105 105  CO2 24 26 23   GLUCOSE 136* 150* 147*  BUN 61* 59* 56*  CREATININE 4.23* 4.46* 4.67*  CALCIUM 9.4 9.5 9.8   Liver Function Tests: No results for input(s): AST, ALT, ALKPHOS, BILITOT, PROT, ALBUMIN in the last 168 hours. No results for input(s): LIPASE, AMYLASE in the last 168 hours. No  results for input(s): AMMONIA in the last 168 hours. CBC: Recent Labs  Lab 03/24/19 0945 03/25/19 0454 03/26/19 0504 03/27/19 0538 03/28/19 0701  WBC 6.9 7.8 7.6 6.0 7.6  HGB 10.9* 10.1* 10.9* 10.7* 11.6*  HCT 33.9* 31.2* 33.6* 33.2* 35.9*  MCV 93.4 92.0 92.1 92.0 92.5  PLT 142* 157 159 156 167   Cardiac Enzymes: Recent Labs  Lab 03/24/19 0945 03/24/19 1600 03/24/19 2149  TROPONINI 3.78* 4.51* 4.16*   CBG: Recent Labs  Lab 03/27/19 1040 03/27/19 1146 03/27/19 1624 03/27/19 2119 03/28/19 0617  GLUCAP 138* 140* 144* 169* 128*    Iron Studies: No results for input(s): IRON, TIBC, TRANSFERRIN, FERRITIN in the last 72 hours. Studies/Results: Vas US Doppler Pre Cabg  Result Date: 03/28/2019 PREOPERATIVE VASCULAR EVALUATION  Indications: PRE CABG. Performing Technologist: Abram Sander RVS Supporting Technologist: June Leap RDMS, RVT  Examination Guidelines: A complete evaluation includes B-mode imaging, spectral Doppler, color Doppler, and power Doppler as needed of all accessible portions of each vessel. Bilateral testing is considered an integral part of a complete examination. Limited examinations for reoccurring indications may be performed as noted.  Right Carotid Findings: +----------+--------+--------+--------+------------+--------+           PSV cm/sEDV cm/sStenosisDescribe    Comments +----------+--------+--------+--------+------------+--------+ CCA Prox  62      14  heterogenous         +----------+--------+--------+--------+------------+--------+ CCA Distal60      13                                   +----------+--------+--------+--------+------------+--------+ ICA Prox  71      18      1-39%   heterogenous         +----------+--------+--------+--------+------------+--------+ ICA Distal73      22                                   +----------+--------+--------+--------+------------+--------+ ECA       98      7                                     +----------+--------+--------+--------+------------+--------+ Portions of this table do not appear on this page. +----------+--------+-------+--------+------------+           PSV cm/sEDV cmsDescribeArm Pressure +----------+--------+-------+--------+------------+ [id number]            Stenotic             +----------+--------+-------+--------+------------+ +---------+--------+--+--------+--+---------+ VertebralPSV cm/s33EDV cm/s11Antegrade +---------+--------+--+--------+--+---------+ Left Carotid Findings: +----------+--------+--------+--------+--------------------------+--------+           PSV cm/sEDV cm/sStenosisDescribe                  Comments +----------+--------+--------+--------+--------------------------+--------+ CCA Prox  75      12                                                 +----------+--------+--------+--------+--------------------------+--------+ CCA Distal106     22                                                 +----------+--------+--------+--------+--------------------------+--------+ ICA Prox  115     40      40-59%  heterogenous and irregular         +----------+--------+--------+--------+--------------------------+--------+ ICA Distal107     28                                                 +----------+--------+--------+--------+--------------------------+--------+ ECA       94      11                                                 +----------+--------+--------+--------+--------------------------+--------+ +----------+--------+--------+----------------+------------+ SubclavianPSV cm/sEDV cm/sDescribe        Arm Pressure +----------+--------+--------+----------------+------------+           125             Multiphasic, [id number]          +----------+--------+--------+----------------+------------+ +---------+--------+--+--------+--+---------+ VertebralPSV cm/s49EDV cm/s13Antegrade  +---------+--------+--+--------+--+---------+  ABI Findings: +--------+------------------+-----+---------+----------------------------------+ Right   Rt Pressure (mmHg)IndexWaveform Comment                            +--------+------------------+-----+---------+----------------------------------+  Brachial                       triphasicunable to obtain bp due to fistula +--------+------------------+-----+---------+----------------------------------+ PTA     153               1.01 triphasic                                   +--------+------------------+-----+---------+----------------------------------+ DP      169               1.11 triphasic                                   +--------+------------------+-----+---------+----------------------------------+ +--------+------------------+-----+---------+-------+ Left    Lt Pressure (mmHg)IndexWaveform Comment +--------+------------------+-----+---------+-------+ [id number]                    triphasic        +--------+------------------+-----+---------+-------+ PTA     171               1.12 triphasic        +--------+------------------+-----+---------+-------+ DP      183               1.20 triphasic        +--------+------------------+-----+---------+-------+ +-------+---------------+----------------+ ABI/TBIToday's ABI/TBIPrevious ABI/TBI +-------+---------------+----------------+ Right  1.01                            +-------+---------------+----------------+ Left   1.20                            +-------+---------------+----------------+  Right Doppler Findings: +--------+--------+-----+---------+----------------------------------+ Site    PressureIndexDoppler  Comments                           +--------+--------+-----+---------+----------------------------------+ Brachial             triphasicunable to obtain bp due to fistula  +--------+--------+-----+---------+----------------------------------+ Radial               triphasic                                   +--------+--------+-----+---------+----------------------------------+ Ulnar                triphasic                                   +--------+--------+-----+---------+----------------------------------+  Left Doppler Findings: +--------+--------+-----+---------+--------+ Site    PressureIndexDoppler  Comments +--------+--------+-----+---------+--------+ [id number]          triphasic         +--------+--------+-----+---------+--------+ Radial               triphasic         +--------+--------+-----+---------+--------+ Ulnar                triphasic         +--------+--------+-----+---------+--------+  Summary: Right Carotid: Velocities in the right ICA are consistent with a 1-39% stenosis. Left Carotid: Velocities in the left ICA are consistent with a 40-59% stenosis. Vertebrals:  Bilateral vertebral arteries demonstrate antegrade flow. Subclavians: Right  subclavian artery was stenotic. Normal flow hemodynamics were              seen in the left subclavian artery. Right ABI: Resting right ankle-brachial index is within normal range. No evidence of significant right lower extremity arterial disease. Left ABI: Resting left ankle-brachial index is within normal range. No evidence of significant left lower extremity arterial disease. Right Upper Extremity: Doppler waveforms remain within normal limits with right radial compression. Doppler waveforms remain within normal limits with right ulnar compression. Left Upper Extremity: Doppler waveforms remain within normal limits with left radial compression. Doppler waveforms remain within normal limits with left ulnar compression.   Electronically signed by Ruta Hinds MD on 03/28/2019 at 2:38:01 AM.    Final     Medications: Infusions: . sodium chloride    . [START ON 03/29/2019] dexmedetomidine     . [START ON 03/29/2019] DOPamine    . [START ON 03/29/2019] epinephrine    . [START ON 03/29/2019] heparin 30,000 units/NS 1000 mL solution for CELLSAVER    . heparin 1,500 Units/hr (03/28/19 0829)  . [START ON 03/29/2019] levofloxacin (LEVAQUIN) IV    . [START ON 03/29/2019] milrinone    . [START ON 03/29/2019] nitroGLYCERIN    . [START ON 03/29/2019] tranexamic acid (CYKLOKAPRON) infusion ([facility])    . [START ON 03/29/2019] vancomycin      Scheduled Medications: . aspirin EC  81 mg Oral Daily  . atorvastatin  40 mg Oral q1800  . calcitRIOL  0.5 mcg Oral Daily  . [START ON 03/29/2019] chlorhexidine  15 mL Mouth/Throat Once  . Chlorhexidine Gluconate Cloth  6 each Topical Once   And  . Chlorhexidine Gluconate Cloth  6 each Topical Once  . [START ON 03/29/2019] diazepam  2 mg Oral Once  . gabapentin  200 mg Oral QHS  . [START ON 03/29/2019] heparin-papaverine-plasmalyte irrigation   Irrigation To OR  . insulin aspart  0-9 Units Subcutaneous TID WC  . [START ON 03/29/2019] insulin   Intravenous To OR  . isosorbide mononitrate  30 mg Oral Daily  . [START ON 03/29/2019] magnesium sulfate  40 mEq Other To OR  . metoprolol succinate  50 mg Oral Daily  . [START ON 03/29/2019] metoprolol tartrate  12.5 mg Oral Once  . [START ON 03/29/2019] phenylephrine  30-200 mcg/min Intravenous To OR  . [START ON 03/29/2019] potassium chloride  80 mEq Other To OR  . sodium chloride flush  3 mL Intravenous Q12H  . tamsulosin  0.4 mg Oral QAC supper  . [START ON 03/29/2019] tranexamic acid  15 mg/kg Intravenous To OR  . [START ON 03/29/2019] tranexamic acid  2 mg/kg Intracatheter To OR  . zolpidem  5 mg Oral QHS    have reviewed scheduled and prn medications.  Physical Exam: General:NAD, comfortable Heart:RRR, s1s2 nl, no rubs Lungs: Clear bilateral, no wheezing crackles Abdomen:soft, Non-tender, non-distended Extremities:No edema Dialysis Access: Right AV fistula is mature and has a good thrill and bruit.  Jaelee Laughter Prasad  Kalim Kissel 03/28/2019,2:15 PM  LOS: 4 days

## 2019-03-28 NOTE — Progress Notes (Signed)
ANTICOAGULATION CONSULT NOTE  Pharmacy Consult for Heparin Indication: NSTEMI  Patient Measurements: Height: 6\' 2"  (188 cm) Weight: 208 lb 14.4 oz (94.8 kg)(scale c) IBW/kg (Calculated) : 82.2 Heparin Dosing Weight: 98.2 kg  Vital Signs: Temp: 98.3 F (36.8 C) (05/05 0502) Temp Source: Oral (05/05 0502) BP: 128/83 (05/05 0826) Pulse Rate: 75 (05/05 0826)  Labs: Recent Labs    03/26/19 0504 03/27/19 0538 03/27/19 2154 03/28/19 0701  HGB 10.9* 10.7*  --  11.6*  HCT 33.6* 33.2*  --  35.9*  PLT 159 156  --  167  HEPARINUNFRC 0.39 0.29* 0.18* 0.34  CREATININE 4.23* 4.46*  --  4.67*    Assessment:  Heparin level was 0.29 on heparin drip 1400 units/hr.  Now s/p cardiac cath today 03/27/19 which revealed 3vCAD.  TCTS consulted.  Pharmacy consulted to restart heparin 4 hr post sheath removal.  Sheath removed yesterday. Heparin level is therapeutic. Planning CABG.   Goal of Therapy:  Heparin level 0.3-0.7 units/ml Monitor platelets by anticoagulation protocol: Yes   Plan:  -Continue heparin at 1500 units/hr -Daily HL, CBC -F/u plans for CABG   Harvel Quale  03/28/2019 9:39 AM

## 2019-03-28 NOTE — Progress Notes (Signed)
1 Day Post-Op Procedure(s) (LRB): LEFT HEART CATH AND CORONARY ANGIOGRAPHY (N/A) Coronary Pressure Wire/FFR w/3D Mapping (N/A) Subjective: No complaints, denies chest pain or shortness of breath.  Objective: Vital signs in last 24 hours: Temp:  [98 F (36.7 C)-99 F (37.2 C)] 98.3 F (36.8 C) (05/05 0502) Pulse Rate:  [69-81] 75 (05/05 0826) Cardiac Rhythm: Normal sinus rhythm (05/05 0913) Resp:  [10-18] 18 (05/05 0502) BP: (128-164)/(70-91) 128/83 (05/05 0826) SpO2:  [92 %-100 %] 95 % (05/05 0502) Weight:  [94.8 kg] 94.8 kg (05/05 0502)  Hemodynamic parameters for last 24 hours:    Intake/Output from previous day: 05/04 0701 - 05/05 0700 In: 687 [P.O.:684; I.V.:3] Out: 2100 [Urine:2100] Intake/Output this shift: Total I/O In: 240 [P.O.:240] Out: 0   General appearance: alert, cooperative and no distress Neurologic: intact Heart: regular rate and rhythm Lungs: clear to auscultation bilaterally  Lab Results: Recent Labs    03/27/19 0538 03/28/19 0701  WBC 6.0 7.6  HGB 10.7* 11.6*  HCT 33.2* 35.9*  PLT 156 167   BMET:  Recent Labs    03/27/19 0538 03/28/19 0701  NA 142 141  K 4.1 4.6  CL 105 105  CO2 26 23  GLUCOSE 150* 147*  BUN 59* 56*  CREATININE 4.46* 4.67*  CALCIUM 9.5 9.8    PT/INR: No results for input(s): LABPROT, INR in the last 72 hours. ABG No results found for: PHART, HCO3, TCO2, ACIDBASEDEF, O2SAT CBG (last 3)  Recent Labs    03/27/19 1624 03/27/19 2119 03/28/19 0617  GLUCAP 144* 169* 128*    Assessment/Plan: S/P Procedure(s) (LRB): LEFT HEART CATH AND CORONARY ANGIOGRAPHY (N/A) Coronary Pressure Wire/FFR w/3D Mapping (N/A) -For CABG in AM All questions answered Creatinine 4.67, baseline on admission was 4.57 Left carotid 40-59% stenosis   LOS: 4 days    Melrose Nakayama 03/28/2019

## 2019-03-28 NOTE — Progress Notes (Signed)
Cardiac Rehab Advisory Cardiac Rehab Phase I is not seeing pts face to face at this time due to Covid 19 restrictions. Ambulation is occurring through nursing, PT, and mobility teams. We will help facilitate that process as needed. We are calling pts in their rooms and discussing education. We will then deliver education materials to pts RN for delivery to pt.   [phone] Called pt to discuss importance of IS and walking after surgery. Pt's RN will need to give IS and have pt demonstrate. Will give [facility] booklet, care guide, and in the tube handout. Encouraged pt to view pre op video. Discussed sternal precautions and staying in the tube. Pt stated his wife will be available after discharge to assist in his care. Graylon Good RN BSN 03/28/2019 8:51 AM

## 2019-03-28 NOTE — Progress Notes (Signed)
Progress Note  Patient Name: John [name] Date of Encounter: 03/28/2019  Primary Cardiologist: Dr. Al Pimple  Subjective   No chest pain  Inpatient Medications    Scheduled Meds: . aspirin EC  81 mg Oral Daily  . atorvastatin  40 mg Oral q1800  . calcitRIOL  0.5 mcg Oral Daily  . gabapentin  200 mg Oral QHS  . insulin aspart  0-9 Units Subcutaneous TID WC  . isosorbide mononitrate  30 mg Oral Daily  . metoprolol succinate  50 mg Oral Daily  . sodium chloride flush  3 mL Intravenous Q12H  . tamsulosin  0.4 mg Oral QAC supper  . zolpidem  5 mg Oral QHS   Continuous Infusions: . sodium chloride    . heparin 1,500 Units/hr (03/27/19 2327)   PRN Meds: sodium chloride, acetaminophen, alum & mag hydroxide-simeth, morphine injection, ondansetron (ZOFRAN) IV, ondansetron (ZOFRAN) IV, polyvinyl alcohol, sodium chloride, sodium chloride flush   Vital Signs    Vitals:   03/27/19 1257 03/27/19 1345 03/27/19 1943 03/28/19 0502  BP: (!) 143/86 134/76 (!) 147/75 (!) 144/85  Pulse: 81 70 79 79  Resp:   18 18  Temp:   99 F (37.2 C) 98.3 F (36.8 C)  TempSrc:   Oral Oral  SpO2:   100% 95%  Weight:    94.8 kg  Height:        Intake/Output Summary (Last 24 hours) at 03/28/2019 0809 Last data filed at 03/28/2019 0804 Gross per 24 hour  Intake 927 ml  Output 2100 ml  Net -1173 ml   Last 3 Weights 03/28/2019 03/27/2019 03/26/2019  Weight (lbs) 208 lb 14.4 oz 212 lb 11.2 oz 212 lb 8 oz  Weight (kg) 94.756 kg 96.48 kg 96.389 kg      Telemetry    Sinus rhythm- Personally Reviewed  ECG    N/A - Personally Reviewed  Physical Exam   GEN: No acute distress.   Neck: No JVD Cardiac: RRR, no murmurs, rubs, or gallops.  Respiratory: Clear to auscultation bilaterally. GI: Soft, nontender, non-distended  MS: No edema; No deformity. Neuro:  Nonfocal  Psych: Normal affect   Labs    Chemistry Recent Labs  Lab 03/26/19 0504 03/27/19 0538 03/28/19 0701  NA 142 142  141  K 4.7 4.1 4.6  CL 109 105 105  CO2 24 26 23   GLUCOSE 136* 150* 147*  BUN 61* 59* 56*  CREATININE 4.23* 4.46* 4.67*  CALCIUM 9.4 9.5 9.8  GFRNONAA 14* 13* 12*  GFRAA 16* 15* 14*  ANIONGAP 9 11 13      Hematology Recent Labs  Lab 03/26/19 0504 03/27/19 0538 03/28/19 0701  WBC 7.6 6.0 7.6  RBC 3.65* 3.61* 3.88*  HGB 10.9* 10.7* 11.6*  HCT 33.6* 33.2* 35.9*  MCV 92.1 92.0 92.5  MCH 29.9 29.6 29.9  MCHC 32.4 32.2 32.3  RDW 13.2 13.1 13.1  PLT 159 156 167    Cardiac Enzymes Recent Labs  Lab 03/24/19 0945 03/24/19 1600 03/24/19 2149  TROPONINI 3.78* 4.51* 4.16*   No results for input(s): TROPIPOC in the last 168 hours.   BNPNo results for input(s): BNP, PROBNP in the last 168 hours.   DDimer No results for input(s): DDIMER in the last 168 hours.   Radiology    Vas US Doppler Pre Cabg  Result Date: 03/28/2019 PREOPERATIVE VASCULAR EVALUATION  Indications: PRE CABG. Performing Technologist: Abram Sander RVS Supporting Technologist: June Leap RDMS, RVT  Examination Guidelines: A complete evaluation  includes B-mode imaging, spectral Doppler, color Doppler, and power Doppler as needed of all accessible portions of each vessel. Bilateral testing is considered an integral part of a complete examination. Limited examinations for reoccurring indications may be performed as noted.  Right Carotid Findings: +----------+--------+--------+--------+------------+--------+           PSV cm/sEDV cm/sStenosisDescribe    Comments +----------+--------+--------+--------+------------+--------+ CCA Prox  62      14              heterogenous         +----------+--------+--------+--------+------------+--------+ CCA Distal60      13                                   +----------+--------+--------+--------+------------+--------+ ICA Prox  71      18      1-39%   heterogenous         +----------+--------+--------+--------+------------+--------+ ICA Distal73      22                                    +----------+--------+--------+--------+------------+--------+ ECA       98      7                                    +----------+--------+--------+--------+------------+--------+ Portions of this table do not appear on this page. +----------+--------+-------+--------+------------+           PSV cm/sEDV cmsDescribeArm Pressure +----------+--------+-------+--------+------------+ [id number]            Stenotic             +----------+--------+-------+--------+------------+ +---------+--------+--+--------+--+---------+ VertebralPSV cm/s33EDV cm/s11Antegrade +---------+--------+--+--------+--+---------+ Left Carotid Findings: +----------+--------+--------+--------+--------------------------+--------+           PSV cm/sEDV cm/sStenosisDescribe                  Comments +----------+--------+--------+--------+--------------------------+--------+ CCA Prox  75      12                                                 +----------+--------+--------+--------+--------------------------+--------+ CCA Distal106     22                                                 +----------+--------+--------+--------+--------------------------+--------+ ICA Prox  115     40      40-59%  heterogenous and irregular         +----------+--------+--------+--------+--------------------------+--------+ ICA Distal107     28                                                 +----------+--------+--------+--------+--------------------------+--------+ ECA       94      11                                                 +----------+--------+--------+--------+--------------------------+--------+ +----------+--------+--------+----------------+------------+  SubclavianPSV cm/sEDV cm/sDescribe        Arm Pressure +----------+--------+--------+----------------+------------+           125             Multiphasic, [id number]           +----------+--------+--------+----------------+------------+ +---------+--------+--+--------+--+---------+ VertebralPSV cm/s49EDV cm/s13Antegrade +---------+--------+--+--------+--+---------+  ABI Findings: +--------+------------------+-----+---------+----------------------------------+ Right   Rt Pressure (mmHg)IndexWaveform Comment                            +--------+------------------+-----+---------+----------------------------------+ Brachial                       triphasicunable to obtain bp due to fistula +--------+------------------+-----+---------+----------------------------------+ PTA     153               1.01 triphasic                                   +--------+------------------+-----+---------+----------------------------------+ DP      169               1.11 triphasic                                   +--------+------------------+-----+---------+----------------------------------+ +--------+------------------+-----+---------+-------+ Left    Lt Pressure (mmHg)IndexWaveform Comment +--------+------------------+-----+---------+-------+ [id number]                    triphasic        +--------+------------------+-----+---------+-------+ PTA     171               1.12 triphasic        +--------+------------------+-----+---------+-------+ DP      183               1.20 triphasic        +--------+------------------+-----+---------+-------+ +-------+---------------+----------------+ ABI/TBIToday's ABI/TBIPrevious ABI/TBI +-------+---------------+----------------+ Right  1.01                            +-------+---------------+----------------+ Left   1.20                            +-------+---------------+----------------+  Right Doppler Findings: +--------+--------+-----+---------+----------------------------------+ Site    PressureIndexDoppler  Comments                            +--------+--------+-----+---------+----------------------------------+ Brachial             triphasicunable to obtain bp due to fistula +--------+--------+-----+---------+----------------------------------+ Radial               triphasic                                   +--------+--------+-----+---------+----------------------------------+ Ulnar                triphasic                                   +--------+--------+-----+---------+----------------------------------+  Left Doppler Findings: +--------+--------+-----+---------+--------+ Site    PressureIndexDoppler  Comments +--------+--------+-----+---------+--------+ [id number]          triphasic         +--------+--------+-----+---------+--------+  Radial               triphasic         +--------+--------+-----+---------+--------+ Ulnar                triphasic         +--------+--------+-----+---------+--------+  Summary: Right Carotid: Velocities in the right ICA are consistent with a 1-39% stenosis. Left Carotid: Velocities in the left ICA are consistent with a 40-59% stenosis. Vertebrals:  Bilateral vertebral arteries demonstrate antegrade flow. Subclavians: Right subclavian artery was stenotic. Normal flow hemodynamics were              seen in the left subclavian artery. Right ABI: Resting right ankle-brachial index is within normal range. No evidence of significant right lower extremity arterial disease. Left ABI: Resting left ankle-brachial index is within normal range. No evidence of significant left lower extremity arterial disease. Right Upper Extremity: Doppler waveforms remain within normal limits with right radial compression. Doppler waveforms remain within normal limits with right ulnar compression. Left Upper Extremity: Doppler waveforms remain within normal limits with left radial compression. Doppler waveforms remain within normal limits with left ulnar compression.   Electronically signed by  Ruta Hinds MD on 03/28/2019 at 2:38:01 AM.    Final     Cardiac Studies   TEE: 03/25/19  1. The left ventricle has low normal systolic function, with an ejection fraction of 50-55%. The cavity size was mild to moderately dilated. Left ventricular diastolic Doppler parameters are consistent with impaired relaxation. 2. The right ventricle has normal systolic function. The cavity was normal. There is no increase in right ventricular wall thickness. 3. Left atrial size was moderately dilated. 4. The aortic valve is tricuspid. Mild calcification of the aortic valve. 5. The aortic root is normal in size and structure. 6. The inferior vena cava was dilated in size with >50% respiratory variability.  Cath: 03/27/19   Mid Cx lesion is 90% stenosed.  Ost Cx to Prox Cx lesion is 80% stenosed.  Prox LAD to Mid LAD lesion is 75% stenosed. This lesion is hemodynamically significant and had a markedly positive DFR 0.71.  Ost 1st Diag lesion is 70% stenosed.  Prox RCA to Mid RCA lesion is 75% stenosed.  Dist RCA-1 lesion is 40% stenosed. Prior stent with mild instent restenosis.  Dist RCA-2 lesion is 80% stenosed.  LV end diastolic pressure is normal.  There is no aortic valve stenosis.   Severe multivessel disease.  Plan for surgical consult.  Diagnostic  Dominance: Right    Patient Profile     68 y.o. male with PMH CAD, PVD, type II diabetes on insulin, stage 5 CKD with AV fistula and undergoing transplant evaluation transferred from [facility] for chest pain and NSTEMI.   Assessment & Plan    1. NSTEMI: Trop peaked at 4.51. Underwent cardiac cath Dr. Irish Lack yesterday showing severe calcified three-vessel disease with surgical anatomy.  Dr. Roxan Hockey consulted for possible CABG and felt to be appropriate candidate. Undergoing pre op testing. --Remains on IV heparin, ASA, statin, BB, Imdur.  2. HL: on high dose statin. LDL 44  3. CKD stage 5: has fistula in place but  not currently on HD. Nephrology following. Cr continues around 4.3-4.4.   4. HTN: tolerating BB  5. DM: on SSI. Hgb A1c 7.5  For questions or updates, please contact [place] Please consult [email or web address] for contact info under        Signed, Reino Bellis, NP  03/28/2019,  8:09 AM     Agree with note by Reino Bellis NP-C  John [name] had cardiac cath yesterday by Dr. Irish Lack revealing three-vessel disease.  He has surgical anatomy.  He was a non-STEMI.  He remains on IV heparin.  He was seen by Dr. Roxan Hockey yesterday and is scheduled for CABG tomorrow.  His exam is benign.  Lorretta Harp, M.D., [place], [facility], [place]verta [place] [address] [place], [place]  [address]  [phone] 03/28/2019 10:43 AM

## 2019-03-28 NOTE — Progress Notes (Signed)
PROGRESS NOTE    John [name]  AYT:[id number] DOB: [birth year] DOA: 03/24/2019 PCP: Imagene Riches, NP    Brief Narrative:  68 year old male who presented with chest pain. He does have significant past medical history for hypertension, dyslipidemia, type 2 diabetes mellitus, coronary artery disease status post RCA stenting, chronic kidney disease stage V status post AV fistula, and peripheral vascular disease status post aortobifemoral bypass. He reported acute onset of chest pain, substernal, associated with dyspnea, no radiation. Worsening precordial chest pressure, initially improved with rest, then only with sublingual nitroglycerin.On his initial physical examination his blood pressure was 131/78, heart rate 86, respiratory rate 18, temperature 97.5, oxygen saturation 98%, he had mucous membranes, his lungs were clear to auscultation bilaterally, heart S1-S2 present and rhythmic, abdomen was soft nontender, no lower extremity edema. Right upper extremity fistula.Sodium 142, potassium 4.8, chloride 108, bicarb 20, glucose 134, BUN 63, creatinine 4.71, troponin 4.16, white count 7.8, hemoglobin 10.1, hematocrit 31.2, platelets 157.His EKG had 87 bpm, normal axis normal intervals, sinus rhythm with normal conduction, J-point elevation V1 to V3, no T wave changes.  Patient was admitted to the hospital working diagnosis of atypical chest pain,complicated by troponin elevation, rule out acute coronary syndrome.   Assessment & Plan:   Principal Problem:   NSTEMI (non-ST elevated myocardial infarction) ([place]) Active Problems:   DM (diabetes mellitus), type 2 with renal complications ([facility])   Essential hypertension   HLD (hyperlipidemia)   Chest pain   CKD (chronic kidney disease), stage V ([place])   1. CAD with NSTEMI. Echocardiography with preserved LV systolic function and no wall motion abnormalities. Multivessel CAD, Mid Cx 90%, Ost Cx to Prox Cx 80%, Prox LAD to Mid LAD 75%, Ost  1st Diag 70%, RCA to mid RCA 75%, Dist RCA (1 lesion) 40% (instent restenosis), Dist RAC (2nd lesion) 80%. Plan for CABG in the am, continue IV heparin, isosorbide, asa, statin and B blockade. Avoiding ace inh or RASS inhibition due to reduced GFR. Patient has remained chest pain free.    2. CKD stage V.Clinically continue euvolemic, . His serum cr continue to stable at 4,6, K at 4,6 and serum bicarbonate at 23. Urine output over last 24 H 2,100 ml. His right upper extremity fistula is mature. Continue calcitriol for metabolic bone disease.   3. HTN. On isosorbide and metoprolol for blood pressure control. Systolic BP 355 mmHg this am.   4. T2DM.Fasting glucose this am at 147 mg/dl.On insulin sliding scale for glucose cover and monitoring. Continue to hold on oral hypoglycemic agents.   5. Dyslipidemia.On atorvastatin.   DVT prophylaxis:heparin IV Code Status:full Family Communication:no family at the bedside Disposition Plan/ discharge barriers:CABG in am.    Body mass index is 26.82 kg/m. Malnutrition Type:      Malnutrition Characteristics:      Nutrition Interventions:     RN Pressure Injury Documentation:     Consultants:   Cardiology   CT surgery   Procedures:     Antimicrobials:       Subjective: Patient feeling well this am, no nausea or vomiting, no chest pain or dyspnea.   Objective: Vitals:   03/27/19 1943 03/28/19 0502 03/28/19 0826 03/28/19 1235  BP: (!) 147/75 (!) 144/85 128/83 123/69  Pulse: 79 79 75 72  Resp: 18 18  17   Temp: 99 F (37.2 C) 98.3 F (36.8 C)    TempSrc: Oral Oral    SpO2: 100% 95%  96%  Weight:  94.8 kg  Height:        Intake/Output Summary (Last 24 hours) at 03/28/2019 1423 Last data filed at 03/28/2019 1237 Gross per 24 hour  Intake 925 ml  Output 2600 ml  Net -1675 ml   Filed Weights   03/26/19 0450 03/27/19 0442 03/28/19 0502  Weight: 96.4 kg 96.5 kg 94.8 kg    Examination:   General:  Not in pain or dyspnea, deconditioned  Neurology: Awake and alert, non focal  E ENT: mild pallor, no icterus, oral mucosa moist Cardiovascular: No JVD. S1-S2 present, rhythmic, no gallops, rubs, or murmurs. No lower extremity edema. Pulmonary: positive  breath sounds bilaterally, adequate air movement, no wheezing, rhonchi or rales. Gastrointestinal. Abdomen with no organomegaly, non tender, no rebound or guarding Skin. No rashes Musculoskeletal: no joint deformities     Data Reviewed: I have personally reviewed following labs and imaging studies  CBC: Recent Labs  Lab 03/24/19 0945 03/25/19 0454 03/26/19 0504 03/27/19 0538 03/28/19 0701  WBC 6.9 7.8 7.6 6.0 7.6  HGB 10.9* 10.1* 10.9* 10.7* 11.6*  HCT 33.9* 31.2* 33.6* 33.2* 35.9*  MCV 93.4 92.0 92.1 92.0 92.5  PLT 142* 157 159 156 258   Basic Metabolic Panel: Recent Labs  Lab 03/24/19 2149 03/25/19 0454 03/26/19 0504 03/27/19 0538 03/28/19 0701  NA 142 141 142 142 141  K 4.8 4.6 4.7 4.1 4.6  CL 108 110 109 105 105  CO2 20* 22 24 26 23   GLUCOSE 134* 161* 136* 150* 147*  BUN 63* 63* 61* 59* 56*  CREATININE 4.71* 4.33* 4.23* 4.46* 4.67*  CALCIUM 9.6 9.2 9.4 9.5 9.8   GFR: Estimated Creatinine Clearance: 18.3 mL/min (A) (by C-G formula based on SCr of 4.67 mg/dL (H)). Liver Function Tests: No results for input(s): AST, ALT, ALKPHOS, BILITOT, PROT, ALBUMIN in the last 168 hours. No results for input(s): LIPASE, AMYLASE in the last 168 hours. No results for input(s): AMMONIA in the last 168 hours. Coagulation Profile: Recent Labs  Lab 03/28/19 1113  INR 1.1   Cardiac Enzymes: Recent Labs  Lab 03/24/19 0945 03/24/19 1600 03/24/19 2149  TROPONINI 3.78* 4.51* 4.16*   BNP (last 3 results) No results for input(s): PROBNP in the last 8760 hours. HbA1C: No results for input(s): HGBA1C in the last 72 hours. CBG: Recent Labs  Lab 03/27/19 1040 03/27/19 1146 03/27/19 1624 03/27/19 2119 03/28/19 0617   GLUCAP 138* 140* 144* 169* 128*   Lipid Profile: No results for input(s): CHOL, HDL, LDLCALC, TRIG, CHOLHDL, LDLDIRECT in the last 72 hours. Thyroid Function Tests: No results for input(s): TSH, T4TOTAL, FREET4, T3FREE, THYROIDAB in the last 72 hours. Anemia Panel: No results for input(s): VITAMINB12, FOLATE, FERRITIN, TIBC, IRON, RETICCTPCT in the last 72 hours.    Radiology Studies: I have reviewed all of the imaging during this hospital visit personally     Scheduled Meds: . aspirin EC  81 mg Oral Daily  . atorvastatin  40 mg Oral q1800  . calcitRIOL  0.5 mcg Oral Daily  . [START ON 03/29/2019] chlorhexidine  15 mL Mouth/Throat Once  . Chlorhexidine Gluconate Cloth  6 each Topical Once   And  . Chlorhexidine Gluconate Cloth  6 each Topical Once  . [START ON 03/29/2019] diazepam  2 mg Oral Once  . gabapentin  200 mg Oral QHS  . [START ON 03/29/2019] heparin-papaverine-plasmalyte irrigation   Irrigation To OR  . insulin aspart  0-9 Units Subcutaneous TID WC  . [START ON 03/29/2019] insulin   Intravenous To  OR  . isosorbide mononitrate  30 mg Oral Daily  . [START ON 03/29/2019] magnesium sulfate  40 mEq Other To OR  . metoprolol succinate  50 mg Oral Daily  . [START ON 03/29/2019] metoprolol tartrate  12.5 mg Oral Once  . [START ON 03/29/2019] phenylephrine  30-200 mcg/min Intravenous To OR  . [START ON 03/29/2019] potassium chloride  80 mEq Other To OR  . sodium chloride flush  3 mL Intravenous Q12H  . tamsulosin  0.4 mg Oral QAC supper  . [START ON 03/29/2019] tranexamic acid  15 mg/kg Intravenous To OR  . [START ON 03/29/2019] tranexamic acid  2 mg/kg Intracatheter To OR  . zolpidem  5 mg Oral QHS   Continuous Infusions: . sodium chloride    . [START ON 03/29/2019] dexmedetomidine    . [START ON 03/29/2019] DOPamine    . [START ON 03/29/2019] epinephrine    . [START ON 03/29/2019] heparin 30,000 units/NS 1000 mL solution for CELLSAVER    . heparin 1,500 Units/hr (03/28/19 0829)  . [START ON  03/29/2019] levofloxacin (LEVAQUIN) IV    . [START ON 03/29/2019] milrinone    . [START ON 03/29/2019] nitroGLYCERIN    . [START ON 03/29/2019] tranexamic acid (CYKLOKAPRON) infusion ([facility])    . [START ON 03/29/2019] vancomycin       LOS: 4 days        Mauricio Gerome Apley, MD

## 2019-03-29 ENCOUNTER — Encounter (HOSPITAL_COMMUNITY): Payer: Self-pay

## 2019-03-29 ENCOUNTER — Inpatient Hospital Stay (HOSPITAL_COMMUNITY): Payer: Medicare Other

## 2019-03-29 ENCOUNTER — Inpatient Hospital Stay (HOSPITAL_COMMUNITY)
Admission: AD | Disposition: A | Discharge status: Home / Self Care | Payer: Self-pay | Source: Other Acute Inpatient Hospital | Attending: Thoracic Surgery (Cardiothoracic Vascular Surgery)

## 2019-03-29 ENCOUNTER — Inpatient Hospital Stay (HOSPITAL_COMMUNITY): Payer: Medicare Other | Admitting: Certified Registered"

## 2019-03-29 DIAGNOSIS — I25110 Atherosclerotic heart disease of native coronary artery with unstable angina pectoris: Secondary | ICD-10-CM

## 2019-03-29 HISTORY — PX: TEE WITHOUT CARDIOVERSION: SHX5443

## 2019-03-29 HISTORY — PX: CORONARY ARTERY BYPASS GRAFT: SHX141

## 2019-03-29 LAB — POCT I-STAT 4, (NA,K, GLUC, HGB,HCT)
Glucose, Bld: 138 mg/dL — ABNORMAL HIGH (ref 70–99)
Glucose, Bld: 139 mg/dL — ABNORMAL HIGH (ref 70–99)
Glucose, Bld: 120 mg/dL — ABNORMAL HIGH (ref 70–99)
Glucose, Bld: 201 mg/dL — ABNORMAL HIGH (ref 70–99)
Glucose, Bld: 212 mg/dL — ABNORMAL HIGH (ref 70–99)
Glucose, Bld: 188 mg/dL — ABNORMAL HIGH (ref 70–99)
Glucose, Bld: 143 mg/dL — ABNORMAL HIGH (ref 70–99)
HCT: 31 % — ABNORMAL LOW (ref 39.0–52.0)
HCT: 29 % — ABNORMAL LOW (ref 39.0–52.0)
HCT: 26 % — ABNORMAL LOW (ref 39.0–52.0)
HCT: 29 % — ABNORMAL LOW (ref 39.0–52.0)
HCT: 24 % — ABNORMAL LOW (ref 39.0–52.0)
HCT: 29 % — ABNORMAL LOW (ref 39.0–52.0)
HCT: 28 % — ABNORMAL LOW (ref 39.0–52.0)
Hemoglobin: 10.5 g/dL — ABNORMAL LOW (ref 13.0–17.0)
Hemoglobin: 9.9 g/dL — ABNORMAL LOW (ref 13.0–17.0)
Hemoglobin: 8.8 g/dL — ABNORMAL LOW (ref 13.0–17.0)
Hemoglobin: 9.9 g/dL — ABNORMAL LOW (ref 13.0–17.0)
Hemoglobin: 8.2 g/dL — ABNORMAL LOW (ref 13.0–17.0)
Hemoglobin: 9.9 g/dL — ABNORMAL LOW (ref 13.0–17.0)
Hemoglobin: 9.5 g/dL — ABNORMAL LOW (ref 13.0–17.0)
Potassium: 5.3 mmol/L — ABNORMAL HIGH (ref 3.5–5.1)
Potassium: 5 mmol/L (ref 3.5–5.1)
Potassium: 5.2 mmol/L — ABNORMAL HIGH (ref 3.5–5.1)
Potassium: 5.5 mmol/L — ABNORMAL HIGH (ref 3.5–5.1)
Potassium: 6 mmol/L — ABNORMAL HIGH (ref 3.5–5.1)
Potassium: 5.7 mmol/L — ABNORMAL HIGH (ref 3.5–5.1)
Potassium: 5.4 mmol/L — ABNORMAL HIGH (ref 3.5–5.1)
Sodium: 140 mmol/L (ref 135–145)
Sodium: 140 mmol/L (ref 135–145)
Sodium: 139 mmol/L (ref 135–145)
Sodium: 141 mmol/L (ref 135–145)
Sodium: 137 mmol/L (ref 135–145)
Sodium: 139 mmol/L (ref 135–145)
Sodium: 141 mmol/L (ref 135–145)

## 2019-03-29 LAB — POCT I-STAT 7, (LYTES, BLD GAS, ICA,H+H)
Acid-Base Excess: 1 mmol/L (ref 0.0–2.0)
Acid-base deficit: 4 mmol/L — ABNORMAL HIGH (ref 0.0–2.0)
Acid-base deficit: 4 mmol/L — ABNORMAL HIGH (ref 0.0–2.0)
Acid-base deficit: 6 mmol/L — ABNORMAL HIGH (ref 0.0–2.0)
Acid-base deficit: 6 mmol/L — ABNORMAL HIGH (ref 0.0–2.0)
Acid-base deficit: 4 mmol/L — ABNORMAL HIGH (ref 0.0–2.0)
Bicarbonate: 26.8 mmol/L (ref 20.0–28.0)
Bicarbonate: 22.8 mmol/L (ref 20.0–28.0)
Bicarbonate: 20.8 mmol/L (ref 20.0–28.0)
Bicarbonate: 21.3 mmol/L (ref 20.0–28.0)
Bicarbonate: 21.2 mmol/L (ref 20.0–28.0)
Bicarbonate: 22.7 mmol/L (ref 20.0–28.0)
Calcium, Ion: 1.19 mmol/L (ref 1.15–1.40)
Calcium, Ion: 1.23 mmol/L (ref 1.15–1.40)
Calcium, Ion: 1.27 mmol/L (ref 1.15–1.40)
Calcium, Ion: 1.36 mmol/L (ref 1.15–1.40)
Calcium, Ion: 1.33 mmol/L (ref 1.15–1.40)
Calcium, Ion: 1.34 mmol/L (ref 1.15–1.40)
HCT: 26 % — ABNORMAL LOW (ref 39.0–52.0)
HCT: 26 % — ABNORMAL LOW (ref 39.0–52.0)
HCT: 31 % — ABNORMAL LOW (ref 39.0–52.0)
HCT: 32 % — ABNORMAL LOW (ref 39.0–52.0)
HCT: 33 % — ABNORMAL LOW (ref 39.0–52.0)
HCT: 32 % — ABNORMAL LOW (ref 39.0–52.0)
Hemoglobin: 8.8 g/dL — ABNORMAL LOW (ref 13.0–17.0)
Hemoglobin: 8.8 g/dL — ABNORMAL LOW (ref 13.0–17.0)
Hemoglobin: 10.5 g/dL — ABNORMAL LOW (ref 13.0–17.0)
Hemoglobin: 10.9 g/dL — ABNORMAL LOW (ref 13.0–17.0)
Hemoglobin: 11.2 g/dL — ABNORMAL LOW (ref 13.0–17.0)
Hemoglobin: 10.9 g/dL — ABNORMAL LOW (ref 13.0–17.0)
O2 Saturation: 100 %
O2 Saturation: 100 %
O2 Saturation: 95 %
O2 Saturation: 97 %
O2 Saturation: 98 %
O2 Saturation: 98 %
Patient temperature: 36.2
Patient temperature: 36.1
Patient temperature: 37
Patient temperature: 37.3
Potassium: 5.2 mmol/L — ABNORMAL HIGH (ref 3.5–5.1)
Potassium: 5.7 mmol/L — ABNORMAL HIGH (ref 3.5–5.1)
Potassium: 5.4 mmol/L — ABNORMAL HIGH (ref 3.5–5.1)
Potassium: 4.9 mmol/L (ref 3.5–5.1)
Potassium: 5.2 mmol/L — ABNORMAL HIGH (ref 3.5–5.1)
Potassium: 5.4 mmol/L — ABNORMAL HIGH (ref 3.5–5.1)
Sodium: 142 mmol/L (ref 135–145)
Sodium: 139 mmol/L (ref 135–145)
Sodium: 141 mmol/L (ref 135–145)
Sodium: 142 mmol/L (ref 135–145)
Sodium: 142 mmol/L (ref 135–145)
Sodium: 141 mmol/L (ref 135–145)
TCO2: 28 mmol/L (ref 22–32)
TCO2: 24 mmol/L (ref 22–32)
TCO2: 22 mmol/L (ref 22–32)
TCO2: 23 mmol/L (ref 22–32)
TCO2: 23 mmol/L (ref 22–32)
TCO2: 24 mmol/L (ref 22–32)
pCO2 arterial: 50.5 mmHg — ABNORMAL HIGH (ref 32.0–48.0)
pCO2 arterial: 49.3 mmHg — ABNORMAL HIGH (ref 32.0–48.0)
pCO2 arterial: 35.5 mmHg (ref 32.0–48.0)
pCO2 arterial: 48.1 mmHg — ABNORMAL HIGH (ref 32.0–48.0)
pCO2 arterial: 46.3 mmHg (ref 32.0–48.0)
pCO2 arterial: 46.5 mmHg (ref 32.0–48.0)
pH, Arterial: 7.333 — ABNORMAL LOW (ref 7.350–7.450)
pH, Arterial: 7.273 — ABNORMAL LOW (ref 7.350–7.450)
pH, Arterial: 7.371 (ref 7.350–7.450)
pH, Arterial: 7.25 — ABNORMAL LOW (ref 7.350–7.450)
pH, Arterial: 7.268 — ABNORMAL LOW (ref 7.350–7.450)
pH, Arterial: 7.298 — ABNORMAL LOW (ref 7.350–7.450)
pO2, Arterial: 410 mmHg — ABNORMAL HIGH (ref 83.0–108.0)
pO2, Arterial: 216 mmHg — ABNORMAL HIGH (ref 83.0–108.0)
pO2, Arterial: 77 mmHg — ABNORMAL LOW (ref 83.0–108.0)
pO2, Arterial: 106 mmHg (ref 83.0–108.0)
pO2, Arterial: 111 mmHg — ABNORMAL HIGH (ref 83.0–108.0)
pO2, Arterial: 114 mmHg — ABNORMAL HIGH (ref 83.0–108.0)

## 2019-03-29 LAB — CBC
HCT: 35.4 % — ABNORMAL LOW (ref 39.0–52.0)
HCT: 32.5 % — ABNORMAL LOW (ref 39.0–52.0)
HCT: 36.2 % — ABNORMAL LOW (ref 39.0–52.0)
Hemoglobin: 11.3 g/dL — ABNORMAL LOW (ref 13.0–17.0)
Hemoglobin: 10.4 g/dL — ABNORMAL LOW (ref 13.0–17.0)
Hemoglobin: 11.4 g/dL — ABNORMAL LOW (ref 13.0–17.0)
MCH: 29.6 pg (ref 26.0–34.0)
MCH: 30.1 pg (ref 26.0–34.0)
MCH: 29.6 pg (ref 26.0–34.0)
MCHC: 31.9 g/dL (ref 30.0–36.0)
MCHC: 32 g/dL (ref 30.0–36.0)
MCHC: 31.5 g/dL (ref 30.0–36.0)
MCV: 92.7 fL (ref 80.0–100.0)
MCV: 94.2 fL (ref 80.0–100.0)
MCV: 94 fL (ref 80.0–100.0)
Platelets: 168 10*3/uL (ref 150–400)
Platelets: 109 10*3/uL — ABNORMAL LOW (ref 150–400)
Platelets: 137 10*3/uL — ABNORMAL LOW (ref 150–400)
RBC: 3.82 MIL/uL — ABNORMAL LOW (ref 4.22–5.81)
RBC: 3.45 MIL/uL — ABNORMAL LOW (ref 4.22–5.81)
RBC: 3.85 MIL/uL — ABNORMAL LOW (ref 4.22–5.81)
RDW: 13.1 % (ref 11.5–15.5)
RDW: 13.1 % (ref 11.5–15.5)
RDW: 13.2 % (ref 11.5–15.5)
WBC: 7.2 10*3/uL (ref 4.0–10.5)
WBC: 9.5 10*3/uL (ref 4.0–10.5)
WBC: 14.5 10*3/uL — ABNORMAL HIGH (ref 4.0–10.5)
nRBC: 0 % (ref 0.0–0.2)
nRBC: 0 % (ref 0.0–0.2)
nRBC: 0 % (ref 0.0–0.2)

## 2019-03-29 LAB — BASIC METABOLIC PANEL
Anion gap: 13 (ref 5–15)
BUN: 54 mg/dL — ABNORMAL HIGH (ref 8–23)
CO2: 19 mmol/L — ABNORMAL LOW (ref 22–32)
Calcium: 9.1 mg/dL (ref 8.9–10.3)
Chloride: 109 mmol/L (ref 98–111)
Creatinine, Ser: 4.57 mg/dL — ABNORMAL HIGH (ref 0.61–1.24)
GFR calc Af Amer: 15 mL/min — ABNORMAL LOW (ref >60)
GFR calc non Af Amer: 13 mL/min — ABNORMAL LOW (ref >60)
Glucose, Bld: 132 mg/dL — ABNORMAL HIGH (ref 70–99)
Potassium: 5.1 mmol/L (ref 3.5–5.1)
Sodium: 141 mmol/L (ref 135–145)

## 2019-03-29 LAB — GLUCOSE, CAPILLARY
Glucose-Capillary: 112 mg/dL — ABNORMAL HIGH (ref 70–99)
Glucose-Capillary: 122 mg/dL — ABNORMAL HIGH (ref 70–99)
Glucose-Capillary: 123 mg/dL — ABNORMAL HIGH (ref 70–99)
Glucose-Capillary: 125 mg/dL — ABNORMAL HIGH (ref 70–99)
Glucose-Capillary: 128 mg/dL — ABNORMAL HIGH (ref 70–99)
Glucose-Capillary: 129 mg/dL — ABNORMAL HIGH (ref 70–99)
Glucose-Capillary: 131 mg/dL — ABNORMAL HIGH (ref 70–99)
Glucose-Capillary: 136 mg/dL — ABNORMAL HIGH (ref 70–99)
Glucose-Capillary: 144 mg/dL — ABNORMAL HIGH (ref 70–99)
Glucose-Capillary: 91 mg/dL (ref 70–99)
Glucose-Capillary: 92 mg/dL (ref 70–99)

## 2019-03-29 LAB — ECHO INTRAOPERATIVE TEE
Height: 74 in
Weight: 3299.2 oz

## 2019-03-29 LAB — BLOOD GAS, ARTERIAL
Acid-base deficit: 1.1 mmol/L (ref 0.0–2.0)
Bicarbonate: 23.2 mmol/L (ref 20.0–28.0)
Drawn by: 350431
FIO2: 0.21
O2 Saturation: 93.3 %
Patient temperature: 98.2
pCO2 arterial: 39.3 mmHg (ref 32.0–48.0)
pH, Arterial: 7.388 (ref 7.350–7.450)
pO2, Arterial: 67.7 mmHg — ABNORMAL LOW (ref 83.0–108.0)

## 2019-03-29 LAB — COMPREHENSIVE METABOLIC PANEL
ALT: 37 U/L (ref 0–44)
AST: 30 U/L (ref 15–41)
Albumin: 3.3 g/dL — ABNORMAL LOW (ref 3.5–5.0)
Alkaline Phosphatase: 58 U/L (ref 38–126)
Anion gap: 13 (ref 5–15)
BUN: 59 mg/dL — ABNORMAL HIGH (ref 8–23)
CO2: 23 mmol/L (ref 22–32)
Calcium: 10 mg/dL (ref 8.9–10.3)
Chloride: 106 mmol/L (ref 98–111)
Creatinine, Ser: 4.83 mg/dL — ABNORMAL HIGH (ref 0.61–1.24)
GFR calc Af Amer: 14 mL/min — ABNORMAL LOW (ref >60)
GFR calc non Af Amer: 12 mL/min — ABNORMAL LOW (ref >60)
Glucose, Bld: 140 mg/dL — ABNORMAL HIGH (ref 70–99)
Potassium: 4.3 mmol/L (ref 3.5–5.1)
Sodium: 142 mmol/L (ref 135–145)
Total Bilirubin: 0.6 mg/dL (ref 0.3–1.2)
Total Protein: 6.3 g/dL — ABNORMAL LOW (ref 6.5–8.1)

## 2019-03-29 LAB — HEMOGLOBIN AND HEMATOCRIT, BLOOD
HCT: 25.9 % — ABNORMAL LOW (ref 39.0–52.0)
Hemoglobin: 8.4 g/dL — ABNORMAL LOW (ref 13.0–17.0)

## 2019-03-29 LAB — PLATELET COUNT: Platelets: 146 10*3/uL — ABNORMAL LOW (ref 150–400)

## 2019-03-29 LAB — APTT
aPTT: 65 seconds — ABNORMAL HIGH (ref 24–36)
aPTT: 68 seconds — ABNORMAL HIGH (ref 24–36)

## 2019-03-29 LAB — SURGICAL PCR SCREEN
MRSA, PCR: NEGATIVE
Staphylococcus aureus: POSITIVE — AB

## 2019-03-29 LAB — TYPE AND SCREEN
ABO/RH(D): A POS
Antibody Screen: NEGATIVE

## 2019-03-29 LAB — MAGNESIUM: Magnesium: 1.9 mg/dL (ref 1.7–2.4)

## 2019-03-29 LAB — PROTIME-INR
INR: 1.4 — ABNORMAL HIGH (ref 0.8–1.2)
Prothrombin Time: 16.5 seconds — ABNORMAL HIGH (ref 11.4–15.2)

## 2019-03-29 SURGERY — CORONARY ARTERY BYPASS GRAFTING (CABG)
Anesthesia: General | Site: Chest

## 2019-03-29 MED ORDER — LEVOFLOXACIN IN D5W 750 MG/150ML IV SOLN: 750.0000 mg | INTRAVENOUS | Status: DC

## 2019-03-29 MED ORDER — DOPAMINE-DEXTROSE 3.2-5 MG/ML-% IV SOLN
INTRAVENOUS | Status: DC | PRN
  Administered 2019-03-29: 3 ug/kg/min via INTRAVENOUS

## 2019-03-29 MED ORDER — ROCURONIUM BROMIDE 10 MG/ML (PF) SYRINGE
PREFILLED_SYRINGE | INTRAVENOUS | Status: AC
  Filled 2019-03-29: qty 10

## 2019-03-29 MED ORDER — VANCOMYCIN HCL IN DEXTROSE 1-5 GM/200ML-% IV SOLN: 1000.0000 mg | Freq: Once | INTRAVENOUS | Status: DC

## 2019-03-29 MED ORDER — CHLORHEXIDINE GLUCONATE 0.12% ORAL RINSE (MEDLINE KIT)
15.0000 mL | Freq: Two times a day (BID) | OROMUCOSAL | Status: DC
  Administered 2019-03-29: 20:00:00 15 mL via OROMUCOSAL

## 2019-03-29 MED ORDER — ASPIRIN EC 325 MG PO TBEC
325.0000 mg | DELAYED_RELEASE_TABLET | Freq: Every day | ORAL | Status: DC
  Administered 2019-03-30 – 2019-04-02 (×4): 325 mg via ORAL
  Filled 2019-03-29 (×4): qty 1

## 2019-03-29 MED ORDER — MUPIROCIN 2 % EX OINT
TOPICAL_OINTMENT | Freq: Two times a day (BID) | CUTANEOUS | Status: DC
  Administered 2019-03-29: 20:00:00 via NASAL
  Administered 2019-03-30 (×2): 1 via NASAL
  Administered 2019-03-31 – 2019-04-01 (×3): via NASAL
  Administered 2019-04-01: 1 via NASAL
  Administered 2019-04-02 – 2019-04-04 (×5): via NASAL
  Filled 2019-03-29 (×3): qty 22

## 2019-03-29 MED ORDER — LIDOCAINE 2% (20 MG/ML) 5 ML SYRINGE
INTRAMUSCULAR | Status: AC
  Filled 2019-03-29: qty 5

## 2019-03-29 MED ORDER — ARTIFICIAL TEARS OPHTHALMIC OINT
TOPICAL_OINTMENT | OPHTHALMIC | Status: DC | PRN
  Administered 2019-03-29: 1 via OPHTHALMIC

## 2019-03-29 MED ORDER — PROPOFOL 10 MG/ML IV BOLUS
INTRAVENOUS | Status: DC | PRN
  Administered 2019-03-29: 130 mg via INTRAVENOUS

## 2019-03-29 MED ORDER — HEPARIN SODIUM (PORCINE) 1000 UNIT/ML IJ SOLN
INTRAMUSCULAR | Status: DC | PRN
  Administered 2019-03-29: 31000 [IU] via INTRAVENOUS
  Administered 2019-03-29: 9000 [IU] via INTRAVENOUS
  Administered 2019-03-29: 2000 [IU] via INTRAVENOUS
  Administered 2019-03-29: 10000 [IU] via INTRAVENOUS

## 2019-03-29 MED ORDER — ACETAMINOPHEN 650 MG RE SUPP
650.0000 mg | Freq: Once | RECTAL | Status: AC
  Filled 2019-03-29: qty 1

## 2019-03-29 MED ORDER — 0.9 % SODIUM CHLORIDE (POUR BTL) OPTIME
TOPICAL | Status: DC | PRN
  Administered 2019-03-29: 6000 mL

## 2019-03-29 MED ORDER — LACTATED RINGERS IV SOLN: INTRAVENOUS | Status: DC

## 2019-03-29 MED ORDER — DEXMEDETOMIDINE HCL IN NACL 200 MCG/50ML IV SOLN
0.0000 ug/kg/h | INTRAVENOUS | Status: DC
  Administered 2019-03-29: 0.2 ug/kg/h via INTRAVENOUS
  Filled 2019-03-29: qty 50

## 2019-03-29 MED ORDER — OXYCODONE HCL 5 MG PO TABS
5.0000 mg | ORAL_TABLET | ORAL | Status: DC | PRN
  Administered 2019-03-30 – 2019-04-03 (×9): 5 mg via ORAL
  Filled 2019-03-29 (×9): qty 1

## 2019-03-29 MED ORDER — FENTANYL CITRATE (PF) 100 MCG/2ML IJ SOLN
INTRAMUSCULAR | Status: DC | PRN
  Administered 2019-03-29: 150 ug via INTRAVENOUS
  Administered 2019-03-29: 100 ug via INTRAVENOUS
  Administered 2019-03-29: 25 ug via INTRAVENOUS
  Administered 2019-03-29: 150 ug via INTRAVENOUS
  Administered 2019-03-29: 225 ug via INTRAVENOUS
  Administered 2019-03-29 (×2): 150 ug via INTRAVENOUS
  Administered 2019-03-29: 100 ug via INTRAVENOUS
  Administered 2019-03-29: 200 ug via INTRAVENOUS

## 2019-03-29 MED ORDER — MORPHINE SULFATE (PF) 2 MG/ML IV SOLN
1.0000 mg | INTRAVENOUS | Status: DC | PRN
  Administered 2019-03-29: 21:00:00 1 mg via INTRAVENOUS
  Administered 2019-03-29 – 2019-03-30 (×2): 2 mg via INTRAVENOUS
  Filled 2019-03-29 (×3): qty 1

## 2019-03-29 MED ORDER — INSULIN REGULAR(HUMAN) IN NACL 100-0.9 UT/100ML-% IV SOLN: INTRAVENOUS | Status: DC

## 2019-03-29 MED ORDER — FENTANYL CITRATE (PF) 250 MCG/5ML IJ SOLN
INTRAMUSCULAR | Status: AC
  Filled 2019-03-29: qty 25

## 2019-03-29 MED ORDER — CHLORHEXIDINE GLUCONATE 0.12 % MT SOLN
15.0000 mL | OROMUCOSAL | Status: AC
  Administered 2019-03-29: 15 mL via OROMUCOSAL

## 2019-03-29 MED ORDER — ASPIRIN 81 MG PO CHEW
324.0000 mg | CHEWABLE_TABLET | Freq: Every day | ORAL | Status: DC
  Filled 2019-03-29: qty 4

## 2019-03-29 MED ORDER — ROCURONIUM BROMIDE 10 MG/ML (PF) SYRINGE
PREFILLED_SYRINGE | INTRAVENOUS | Status: DC | PRN
  Administered 2019-03-29: 100 mg via INTRAVENOUS
  Administered 2019-03-29: 50 mg via INTRAVENOUS

## 2019-03-29 MED ORDER — MAGNESIUM SULFATE 4 GM/100ML IV SOLN: 4.0000 g | Freq: Once | INTRAVENOUS | Status: DC

## 2019-03-29 MED ORDER — SODIUM CHLORIDE 0.9 % IV SOLN
INTRAVENOUS | Status: DC
  Administered 2019-03-29: 14:00:00 via INTRAVENOUS

## 2019-03-29 MED ORDER — ONDANSETRON HCL 4 MG/2ML IJ SOLN
4.0000 mg | Freq: Four times a day (QID) | INTRAMUSCULAR | Status: DC | PRN
  Administered 2019-03-30: 4 mg via INTRAVENOUS
  Filled 2019-03-29: qty 2

## 2019-03-29 MED ORDER — NITROGLYCERIN IN D5W 200-5 MCG/ML-% IV SOLN: 0.0000 ug/min | INTRAVENOUS | Status: DC

## 2019-03-29 MED ORDER — ORAL CARE MOUTH RINSE: 15.0000 mL | OROMUCOSAL | Status: DC

## 2019-03-29 MED ORDER — DEXMEDETOMIDINE HCL IN NACL 200 MCG/50ML IV SOLN
INTRAVENOUS | Status: DC | PRN
  Administered 2019-03-29: 0.4 ug/kg/h via INTRAVENOUS

## 2019-03-29 MED ORDER — METOPROLOL TARTRATE 5 MG/5ML IV SOLN
2.5000 mg | INTRAVENOUS | Status: DC | PRN
  Administered 2019-03-30: 2.5 mg via INTRAVENOUS
  Filled 2019-03-29: qty 5

## 2019-03-29 MED ORDER — MIDAZOLAM HCL 5 MG/5ML IJ SOLN
INTRAMUSCULAR | Status: DC | PRN
  Administered 2019-03-29: 3 mg via INTRAVENOUS
  Administered 2019-03-29: 2 mg via INTRAVENOUS
  Administered 2019-03-29: 3 mg via INTRAVENOUS
  Administered 2019-03-29: 2 mg via INTRAVENOUS

## 2019-03-29 MED ORDER — LACTATED RINGERS IV SOLN
INTRAVENOUS | Status: DC | PRN
  Administered 2019-03-29: 08:00:00 via INTRAVENOUS

## 2019-03-29 MED ORDER — LACTATED RINGERS IV SOLN: 500.0000 mL | Freq: Once | INTRAVENOUS | Status: DC | PRN

## 2019-03-29 MED ORDER — MIDAZOLAM HCL (PF) 10 MG/2ML IJ SOLN
INTRAMUSCULAR | Status: AC
  Filled 2019-03-29: qty 2

## 2019-03-29 MED ORDER — PLASMA-LYTE 148 IV SOLN
INTRAVENOUS | Status: DC | PRN
  Administered 2019-03-29: 500 mL via INTRAVASCULAR

## 2019-03-29 MED ORDER — ACETAMINOPHEN 160 MG/5ML PO SOLN
650.0000 mg | Freq: Once | ORAL | Status: AC
  Administered 2019-03-29: 14:00:00 650 mg

## 2019-03-29 MED ORDER — ACETAMINOPHEN 160 MG/5ML PO SOLN: 1000.0000 mg | Freq: Four times a day (QID) | ORAL | Status: AC

## 2019-03-29 MED ORDER — SUCCINYLCHOLINE CHLORIDE 20 MG/ML IJ SOLN
INTRAMUSCULAR | Status: DC | PRN
  Administered 2019-03-29: 140 mg via INTRAVENOUS

## 2019-03-29 MED ORDER — BISACODYL 5 MG PO TBEC
10.0000 mg | DELAYED_RELEASE_TABLET | Freq: Every day | ORAL | Status: DC
  Administered 2019-03-30 – 2019-04-01 (×3): 10 mg via ORAL
  Filled 2019-03-29 (×5): qty 2

## 2019-03-29 MED ORDER — ACETAMINOPHEN 500 MG PO TABS
1000.0000 mg | ORAL_TABLET | Freq: Four times a day (QID) | ORAL | Status: AC
  Administered 2019-03-29 – 2019-04-03 (×20): 1000 mg via ORAL
  Filled 2019-03-29 (×20): qty 2

## 2019-03-29 MED ORDER — BISACODYL 10 MG RE SUPP: 10.0000 mg | Freq: Every day | RECTAL | Status: DC

## 2019-03-29 MED ORDER — LIDOCAINE 2% (20 MG/ML) 5 ML SYRINGE
INTRAMUSCULAR | Status: DC | PRN
  Administered 2019-03-29: 80 mg via INTRAVENOUS

## 2019-03-29 MED ORDER — FAMOTIDINE IN NACL 20-0.9 MG/50ML-% IV SOLN
20.0000 mg | INTRAVENOUS | Status: DC
  Filled 2019-03-29: qty 50

## 2019-03-29 MED ORDER — DOCUSATE SODIUM 100 MG PO CAPS
200.0000 mg | ORAL_CAPSULE | Freq: Every day | ORAL | Status: DC
  Administered 2019-03-30 – 2019-04-04 (×4): 200 mg via ORAL
  Filled 2019-03-29 (×5): qty 2

## 2019-03-29 MED ORDER — MIDAZOLAM HCL 2 MG/2ML IJ SOLN: 2.0000 mg | INTRAMUSCULAR | Status: DC | PRN

## 2019-03-29 MED ORDER — SODIUM CHLORIDE (PF) 0.9 % IJ SOLN
INTRAMUSCULAR | Status: AC
  Filled 2019-03-29: qty 10

## 2019-03-29 MED ORDER — METOCLOPRAMIDE HCL 5 MG/ML IJ SOLN
10.0000 mg | Freq: Four times a day (QID) | INTRAMUSCULAR | Status: DC
  Administered 2019-03-29 – 2019-04-01 (×11): 10 mg via INTRAVENOUS
  Filled 2019-03-29 (×12): qty 2

## 2019-03-29 MED ORDER — TRANEXAMIC ACID 1000 MG/10ML IV SOLN
INTRAVENOUS | Status: DC | PRN
  Administered 2019-03-29: 1.5 mg/kg/h via INTRAVENOUS

## 2019-03-29 MED ORDER — SODIUM CHLORIDE 0.9 % IV SOLN
INTRAVENOUS | Status: DC | PRN
  Administered 2019-03-29: 1.6 [IU]/h via INTRAVENOUS

## 2019-03-29 MED ORDER — PANTOPRAZOLE SODIUM 40 MG PO TBEC
40.0000 mg | DELAYED_RELEASE_TABLET | Freq: Every day | ORAL | Status: DC
  Administered 2019-03-31 – 2019-04-04 (×5): 40 mg via ORAL
  Filled 2019-03-29 (×5): qty 1

## 2019-03-29 MED ORDER — PHENYLEPHRINE HCL-NACL 20-0.9 MG/250ML-% IV SOLN: 0.0000 ug/min | INTRAVENOUS | Status: DC

## 2019-03-29 MED ORDER — PROPOFOL 10 MG/ML IV BOLUS
INTRAVENOUS | Status: AC
  Filled 2019-03-29: qty 20

## 2019-03-29 MED ORDER — TRAMADOL HCL 50 MG PO TABS: 50.0000 mg | ORAL_TABLET | ORAL | Status: DC | PRN

## 2019-03-29 MED ORDER — SODIUM CHLORIDE 0.9 % IV SOLN
INTRAVENOUS | Status: DC | PRN
  Administered 2019-03-29: 25 ug/min via INTRAVENOUS

## 2019-03-29 MED ORDER — ONDANSETRON HCL 4 MG/2ML IJ SOLN
INTRAMUSCULAR | Status: DC | PRN
  Administered 2019-03-29: 4 mg via INTRAVENOUS

## 2019-03-29 MED ORDER — DEXAMETHASONE SODIUM PHOSPHATE 10 MG/ML IJ SOLN
INTRAMUSCULAR | Status: AC
  Filled 2019-03-29: qty 1

## 2019-03-29 MED ORDER — HEMOSTATIC AGENTS (NO CHARGE) OPTIME
TOPICAL | Status: DC | PRN
  Administered 2019-03-29: 1 via TOPICAL

## 2019-03-29 MED ORDER — HEPARIN SODIUM (PORCINE) 1000 UNIT/ML IJ SOLN
INTRAMUSCULAR | Status: AC
  Filled 2019-03-29: qty 1

## 2019-03-29 MED ORDER — PROTAMINE SULFATE 10 MG/ML IV SOLN
INTRAVENOUS | Status: AC
  Filled 2019-03-29: qty 25

## 2019-03-29 MED ORDER — PROTAMINE SULFATE 10 MG/ML IV SOLN
INTRAVENOUS | Status: DC | PRN
  Administered 2019-03-29: 50 mg via INTRAVENOUS
  Administered 2019-03-29: 10 mg via INTRAVENOUS
  Administered 2019-03-29 (×4): 50 mg via INTRAVENOUS
  Administered 2019-03-29: 20 mg via INTRAVENOUS
  Administered 2019-03-29: 50 mg via INTRAVENOUS

## 2019-03-29 MED ORDER — INSULIN REGULAR BOLUS VIA INFUSION
0.0000 [IU] | Freq: Three times a day (TID) | INTRAVENOUS | Status: DC
  Filled 2019-03-29: qty 10

## 2019-03-29 MED ORDER — ONDANSETRON HCL 4 MG/2ML IJ SOLN
INTRAMUSCULAR | Status: AC
  Filled 2019-03-29: qty 2

## 2019-03-29 MED ORDER — SODIUM CHLORIDE 0.45 % IV SOLN
INTRAVENOUS | Status: DC | PRN
  Administered 2019-03-29: 14:00:00 via INTRAVENOUS

## 2019-03-29 MED ORDER — DOPAMINE-DEXTROSE 3.2-5 MG/ML-% IV SOLN
3.0000 ug/kg/min | INTRAVENOUS | Status: DC
  Administered 2019-03-31: 3 ug/kg/min via INTRAVENOUS
  Filled 2019-03-29: qty 250

## 2019-03-29 MED ORDER — ALBUMIN HUMAN 5 % IV SOLN
INTRAVENOUS | Status: DC | PRN
  Administered 2019-03-29 (×2): via INTRAVENOUS

## 2019-03-29 MED ORDER — ARTIFICIAL TEARS OPHTHALMIC OINT
TOPICAL_OINTMENT | OPHTHALMIC | Status: AC
  Filled 2019-03-29: qty 3.5

## 2019-03-29 MED ORDER — METOPROLOL TARTRATE 12.5 MG HALF TABLET
12.5000 mg | ORAL_TABLET | Freq: Two times a day (BID) | ORAL | Status: DC
  Administered 2019-03-30 – 2019-04-03 (×10): 12.5 mg via ORAL
  Filled 2019-03-29 (×10): qty 1

## 2019-03-29 MED ORDER — SUCCINYLCHOLINE CHLORIDE 200 MG/10ML IV SOSY
PREFILLED_SYRINGE | INTRAVENOUS | Status: AC
  Filled 2019-03-29: qty 10

## 2019-03-29 MED ORDER — ALBUMIN HUMAN 5 % IV SOLN: 250.0000 mL | INTRAVENOUS | Status: AC | PRN

## 2019-03-29 MED ORDER — METOPROLOL TARTRATE 25 MG/10 ML ORAL SUSPENSION
12.5000 mg | Freq: Two times a day (BID) | ORAL | Status: DC
  Filled 2019-03-29 (×2): qty 5

## 2019-03-29 MED ORDER — CHLORHEXIDINE GLUCONATE CLOTH 2 % EX PADS
6.0000 | MEDICATED_PAD | Freq: Every day | CUTANEOUS | Status: DC
  Administered 2019-03-29 – 2019-04-03 (×6): 6 via TOPICAL

## 2019-03-29 MED ORDER — SODIUM CHLORIDE 0.9% FLUSH
3.0000 mL | Freq: Two times a day (BID) | INTRAVENOUS | Status: DC
  Administered 2019-03-30 – 2019-04-01 (×5): 3 mL via INTRAVENOUS
  Administered 2019-04-02: 6 mL via INTRAVENOUS
  Administered 2019-04-02: 10 mL via INTRAVENOUS
  Administered 2019-04-03: 3 mL via INTRAVENOUS

## 2019-03-29 MED ORDER — SODIUM CHLORIDE 0.9% FLUSH: 3.0000 mL | INTRAVENOUS | Status: DC | PRN

## 2019-03-29 MED ORDER — SODIUM CHLORIDE 0.9 % IV SOLN: 250.0000 mL | INTRAVENOUS | Status: DC

## 2019-03-29 MED ORDER — SODIUM CHLORIDE (PF) 0.9 % IJ SOLN
OROMUCOSAL | Status: DC | PRN
  Administered 2019-03-29 (×3): 4 mL via TOPICAL

## 2019-03-29 MED ORDER — SODIUM CHLORIDE 0.9% FLUSH: 10.0000 mL | INTRAVENOUS | Status: DC | PRN

## 2019-03-29 SURGICAL SUPPLY — 76 items
BAG DECANTER FOR FLEXI CONT (MISCELLANEOUS) ×3 IMPLANT
BANDAGE ACE 4X5 VEL STRL LF (GAUZE/BANDAGES/DRESSINGS) ×3 IMPLANT
BANDAGE ACE 6X5 VEL STRL LF (GAUZE/BANDAGES/DRESSINGS) ×3 IMPLANT
BANDAGE ELASTIC 4 VELCRO ST LF (GAUZE/BANDAGES/DRESSINGS) ×1 IMPLANT
BANDAGE ELASTIC 6 VELCRO ST LF (GAUZE/BANDAGES/DRESSINGS) ×1 IMPLANT
BASKET HEART (ORDER IN 25'S) (MISCELLANEOUS) ×1
BASKET HEART (ORDER IN 25S) (MISCELLANEOUS) ×2 IMPLANT
BLADE STERNUM SYSTEM 6 (BLADE) ×3 IMPLANT
BLADE SURG 11 STRL SS (BLADE) ×1 IMPLANT
BNDG GAUZE ELAST 4 BULKY (GAUZE/BANDAGES/DRESSINGS) ×3 IMPLANT
CANISTER SUCT 3000ML PPV (MISCELLANEOUS) ×3 IMPLANT
CANNULA EZ GLIDE AORTIC 21FR (CANNULA) ×3 IMPLANT
CATH CPB KIT HENDRICKSON (MISCELLANEOUS) ×3 IMPLANT
CATH ROBINSON RED A/P 18FR (CATHETERS) ×3 IMPLANT
CATH THORACIC 36FR (CATHETERS) ×3 IMPLANT
CATH THORACIC 36FR RT ANG (CATHETERS) ×3 IMPLANT
CLIP FOGARTY SPRING 6M (CLIP) ×1 IMPLANT
CLIP VESOCCLUDE SM WIDE 24/CT (CLIP) ×2 IMPLANT
COVER WAND RF STERILE (DRAPES) ×3 IMPLANT
CRADLE DONUT ADULT HEAD (MISCELLANEOUS) ×3 IMPLANT
DERMABOND ADVANCED (GAUZE/BANDAGES/DRESSINGS) ×1
DERMABOND ADVANCED .7 DNX12 (GAUZE/BANDAGES/DRESSINGS) IMPLANT
DRAPE CARDIOVASCULAR INCISE (DRAPES) ×1
DRAPE SLUSH/WARMER DISC (DRAPES) ×3 IMPLANT
DRAPE SRG 135X102X78XABS (DRAPES) ×2 IMPLANT
DRSG COVADERM 4X14 (GAUZE/BANDAGES/DRESSINGS) ×3 IMPLANT
ELECT REM PT RETURN 9FT ADLT (ELECTROSURGICAL) ×6
ELECTRODE REM PT RTRN 9FT ADLT (ELECTROSURGICAL) ×4 IMPLANT
FELT TEFLON 1X6 (MISCELLANEOUS) ×6 IMPLANT
GAUZE SPONGE 4X4 12PLY STRL (GAUZE/BANDAGES/DRESSINGS) ×6 IMPLANT
GAUZE SPONGE 4X4 12PLY STRL LF (GAUZE/BANDAGES/DRESSINGS) ×2 IMPLANT
GLOVE BIO SURGEON STRL SZ 6.5 (GLOVE) ×4 IMPLANT
GLOVE SURG SIGNA 7.5 PF LTX (GLOVE) ×9 IMPLANT
GOWN STRL REUS W/ TWL LRG LVL3 (GOWN DISPOSABLE) ×8 IMPLANT
GOWN STRL REUS W/ TWL XL LVL3 (GOWN DISPOSABLE) ×4 IMPLANT
GOWN STRL REUS W/TWL LRG LVL3 (GOWN DISPOSABLE) ×4
GOWN STRL REUS W/TWL XL LVL3 (GOWN DISPOSABLE) ×2
HEMOSTAT POWDER SURGIFOAM 1G (HEMOSTASIS) ×9 IMPLANT
HEMOSTAT SURGICEL 2X14 (HEMOSTASIS) ×3 IMPLANT
INSERT FOGARTY XLG (MISCELLANEOUS) IMPLANT
KIT BASIN OR (CUSTOM PROCEDURE TRAY) ×3 IMPLANT
KIT SUCTION CATH 14FR (SUCTIONS) ×6 IMPLANT
KIT TURNOVER KIT B (KITS) ×3 IMPLANT
KIT VASOVIEW HEMOPRO 2 VH 4000 (KITS) ×3 IMPLANT
MARKER GRAFT CORONARY BYPASS (MISCELLANEOUS) ×9 IMPLANT
NS IRRIG 1000ML POUR BTL (IV SOLUTION) ×15 IMPLANT
PACK E OPEN HEART (SUTURE) ×3 IMPLANT
PACK OPEN HEART (CUSTOM PROCEDURE TRAY) ×3 IMPLANT
PAD ARMBOARD 7.5X6 YLW CONV (MISCELLANEOUS) ×6 IMPLANT
PAD ELECT DEFIB RADIOL ZOLL (MISCELLANEOUS) ×3 IMPLANT
PENCIL BUTTON HOLSTER BLD 10FT (ELECTRODE) ×3 IMPLANT
PUNCH AORTIC ROTATE  4.5MM 8IN (MISCELLANEOUS) ×1 IMPLANT
SET CARDIOPLEGIA MPS 5001102 (MISCELLANEOUS) ×1 IMPLANT
SOLUTION ANTI FOG 6CC (MISCELLANEOUS) ×1 IMPLANT
SUT BONE WAX W31G (SUTURE) ×3 IMPLANT
SUT MNCRL AB 4-0 PS2 18 (SUTURE) ×1 IMPLANT
SUT PROLENE 3 0 SH DA (SUTURE) ×3 IMPLANT
SUT PROLENE 4 0 RB 1 (SUTURE) ×3
SUT PROLENE 4-0 RB1 .5 CRCL 36 (SUTURE) IMPLANT
SUT PROLENE 6 0 C 1 30 (SUTURE) ×8 IMPLANT
SUT PROLENE 7 0 BV1 MDA (SUTURE) ×4 IMPLANT
SUT STEEL 6MS V (SUTURE) ×3 IMPLANT
SUT STEEL SZ 6 DBL 3X14 BALL (SUTURE) ×3 IMPLANT
SUT VIC AB 1 CTX 36 (SUTURE) ×2
SUT VIC AB 1 CTX36XBRD ANBCTR (SUTURE) ×4 IMPLANT
SUT VIC AB 2-0 CT1 27 (SUTURE) ×1
SUT VIC AB 2-0 CT1 TAPERPNT 27 (SUTURE) IMPLANT
SYSTEM SAHARA CHEST DRAIN ATS (WOUND CARE) ×3 IMPLANT
TAPE CLOTH SURG 4X10 WHT LF (GAUZE/BANDAGES/DRESSINGS) ×1 IMPLANT
TAPE PAPER 2X10 WHT MICROPORE (GAUZE/BANDAGES/DRESSINGS) ×1 IMPLANT
TOWEL GREEN STERILE (TOWEL DISPOSABLE) ×3 IMPLANT
TOWEL GREEN STERILE FF (TOWEL DISPOSABLE) ×3 IMPLANT
TRAY FOLEY SLVR 16FR TEMP STAT (SET/KITS/TRAYS/PACK) ×3 IMPLANT
TUBING LAP HI FLOW INSUFFLATIO (TUBING) ×3 IMPLANT
UNDERPAD 30X30 (UNDERPADS AND DIAPERS) ×3 IMPLANT
WATER STERILE IRR 1000ML POUR (IV SOLUTION) ×6 IMPLANT

## 2019-03-29 NOTE — Progress Notes (Signed)
Patient was changed back over to SIMV from PS/CPAP on the ventilator due to [facility] only being 7.24 at the end of the second part of the weaning trial. RN is going to try to arouse the patient more and try again shortly.

## 2019-03-29 NOTE — Brief Op Note (Signed)
03/24/2019 - 03/29/2019  1:25 PM  PATIENT:  John [name]  68 y.o. male  PRE-OPERATIVE DIAGNOSIS:  CAD  POST-OPERATIVE DIAGNOSIS:  CAD  PROCEDURE:  Procedure(s):  CORONARY ARTERY BYPASS GRAFTING x 4 -LIMA to LAD -SVG to OM 1 -SVG to DIAG 1 -SVG to PDA  ENDOSCOPIC HARVEST GREATER SAPHENOUS VEIN -Right leg  TRANSESOPHAGEAL ECHOCARDIOGRAM (TEE) (N/A)  SURGEON:  Surgeon(s) and Role:    * Melrose Nakayama, MD - Primary  PHYSICIAN ASSISTANT: Ellwood Handler PA-C  ANESTHESIA:   general  EBL:  1700 mL - sent to cell saver  BLOOD ADMINISTERED: CELLSAVER  DRAINS: Left Pleural Chest Tube, Mediastinal Chest Drains   LOCAL MEDICATIONS USED:  NONE  SPECIMEN:  No Specimen  DISPOSITION OF SPECIMEN:  N/A  COUNTS:  YES  TOURNIQUET:  * No tourniquets in log *  DICTATION: .Dragon Dictation  PLAN OF CARE: Admit to inpatient   PATIENT DISPOSITION:  ICU - intubated and hemodynamically stable.   Delay start of Pharmacological VTE agent (>24hrs) due to surgical blood loss or risk of bleeding: yes

## 2019-03-29 NOTE — Op Note (Signed)
NAME: John [name], John [name] MEDICAL RECORD NI:[id number] ACCOUNT [id number] DATE OF BIRTH:June 19, 1951 FACILITY: [facility] LOCATION: [facility] PHYSICIAN:Kemesha Mosey C. Adedamola Seto, MD  OPERATIVE REPORT  DATE OF PROCEDURE:  03/29/2019  PREOPERATIVE DIAGNOSES:  Three-vessel coronary artery disease, status post non-ST elevation myocardial infarction.  POSTOPERATIVE DIAGNOSIS:  Three-vessel coronary artery disease, status post non-ST elevation myocardial  infarction.  PROCEDURES:  Median sternotomy, extracorporeal circulation Coronary artery bypass grafting x 4  Left internal mammary artery to left anterior descending,  Saphenous vein graft to first diagonal,  Saphenous vein graft to obtuse marginal 1,  Saphenous vein  graft to posterior descending Endoscopic vein harvest right leg.  SURGEON:  Modesto Charon, MD  ASSISTANT:  Ellwood Handler, PA  ANESTHESIA:  General.  FINDINGS:  Transesophageal echocardiography revealed preserved left ventricular function with no significant valvular pathology.  Good quality conduits.  Good quality target vessels.  CLINICAL NOTE:  John [name] is a 68 year old gentleman with multiple cardiac risk factors and a prior history of coronary disease who presented with unstable chest pain.  He ruled in for a non-ST elevation MI.  At catheterization, he was found to have  severe 3-vessel coronary disease.  He was advised to undergo coronary artery bypass grafting for survival benefit and relief of symptoms.  The indications, risks, benefits, and alternatives were discussed in detail with the patient.  He understood and  accepted the risks and agreed to proceed.  He understood that due to his advanced chronic kidney disease, that he was at high risk of need for dialysis postoperatively.  He was willing to accept that risk as well.  DESCRIPTION OF PROCEDURE:  John [name] was brought to the preoperative holding area on 03/29/2019.  Anesthesia placed a Swan-Ganz catheter and  an arterial blood pressure monitoring line.  He was taken to the operating room, anesthetized and intubated.  A  Foley catheter was placed.  Transesophageal echocardiography was performed.  Intravenous antibiotics were administered.  Transesophageal echocardiography revealed preserved left ventricular wall motion with no significant valvular pathology.  The chest,  abdomen and legs were prepped and draped in the usual sterile fashion.  A median sternotomy was performed and the left internal mammary artery was harvested using standard technique.  Simultaneously, incision was made in the medial aspect of the right leg at the level of the knee.  The greater saphenous vein was harvested  from the right thigh and upper calf endoscopically.  Two thousand units of heparin was administered during the vessel harvest.  Both the mammary artery and vein were good quality vessels.  After harvesting the conduits, a sternal retractor was placed.   The remainder of the full heparin dose was given.  The pericardium was opened.  The ascending aorta was inspected.  There was no evidence of atherosclerotic disease.  The aorta was cannulated via concentric 2-0 Ethibond pledgeted pursestring sutures.  A  dual stage venous cannula was placed via pursestring suture in the right atrial appendage.  Cardiopulmonary bypass was initiated.  Flows were maintained per protocol.  The patient was cooled to 32 degrees Celsius.  A dopamine infusion was initiated at  3 mcg per kilogram per minute.  The coronary arteries were inspected and anastomotic sites were chosen.  The conduits were inspected and cut to length.  A foam pad was placed in the pericardium to insulate the heart.  A temperature probe was placed in  the myocardial septum and a cardioplegia cannula was placed in the ascending aorta.  The aorta was cross clamped.  The left ventricle was emptied via the aortic root vent.  Cardiac arrest was then achieved with a combination of  cold antegrade blood cardioplegia and topical iced saline.  One liter of cardioplegia was administered.  There  was rapid diastolic arrest and there was septal cooling to 10 degrees Celsius.  A reversed saphenous vein graft was placed end-to-side to the posterior descending branch of the right coronary.  This was a 1.5 mm good quality target.  The vein was of good quality.  The end-to-side anastomosis was performed with a running 7-0 Prolene  suture.  All anastomoses were probed proximally and distally at their completion to ensure patency.  Cardioplegia was administered down the graft.  There was good flow and good hemostasis.  A reversed saphenous vein graft was placed end-to-side to obtuse marginal 1.  This was the large dominant lateral branch.  It was a 2 mm good quality target.  It bifurcated into 2 branches that both accepted a 1.5 mm probe.  There was plaque just proximal  to the anastomosis, but the artery was of good quality at the site of anastomosis.  The vein was of good quality.  The anastomosis was performed with a running 7-0 Prolene suture.  The probe passed easily and there was good flow and good hemostasis with cardioplegia  administration.  Additional cardioplegia was also administered down the aortic root.  The heart was elevated, exposing the anterolateral wall.  A reversed saphenous vein was anastomosed end-to-side to the first diagonal branch of the LAD.  This was a 1.5 mm good quality target  vessel.  The vein was of good quality.  The end-to-side anastomosis was performed with a running 7-0 Prolene suture and again, there was good flow and good hemostasis with cardioplegia administration.  The left internal mammary artery was brought through a window in the pericardium.  The distal limb was bevelled.  It was then anastomosed end-to-side to the distal LAD.  The mammary was a 2 mm good quality conduit.  The LAD was a 1.5 mm good quality  target.  The anastomosis was performed  with a running 8-0 Prolene suture.  At the completion of the anastomosis, the bulldog clamp was briefly removed to inspect for hemostasis.  Rapid septal rewarming was noted.  The bulldog clamp was replaced.  The  mammary pedicle was tacked to the epicardial surface of the heart with 6-0 Prolene sutures.  Additional cardioplegia was administered.  The vein grafts were cut to length. The cardioplegia cannula was removed from the ascending aorta.  The proximal vein graft anastomoses were performed to 4.5 mm punch aortotomies with running 6-0 Prolene sutures.  At the completion of the final proximal  anastomosis, the patient was placed in Trendelenburg position.  Lidocaine was administered.  The aortic root was deaired and the aortic crossclamp was removed.  The total crossclamp time was 71 minutes.  The patient required a single defibrillation with  10 joules.  He was initially in heart block, but subsequently resumed sinus rhythm.  While rewarming was completed, all proximal and distal anastomoses were inspected for hemostasis.  Epicardial pacing wires were placed on the right ventricle and right  atrium.  DDD pacing was initiated.  When the patient had rewarmed to a core temperature of 37 degrees Celsius, he was weaned from cardiopulmonary bypass on the first attempt.  He was DDD paced and on 3 mcg/kg/minute of dopamine at the time  of separation from bypass.  The total bypass time  was 102 minutes.  Initial cardiac index was greater than 3 liters/minute/meter squared, and the patient remained hemodynamically stable throughout the post-bypass period.  A test dose of protamine was administered and was well tolerated.  The atrial and aortic cannulae were removed. The remainder of the protamine was administered without incident.  Post-bypass transesophageal echocardiography was unchanged from the  pre-bypass study.  The chest was copiously irrigated with warm saline.  Hemostasis was achieved.  Left pleural and  mediastinal chest tubes were placed through separate subcostal incisions.  The pericardium was reapproximated over the ascending aorta and  base of the heart with interrupted 3-0 silk sutures.  The sternum was closed with a combination of single and double heavy gauge stainless steel wires.  The pectoralis fascia, subcutaneous tissue and skin were closed in standard fashion.  All sponge,  needle and instrument counts were correct at the end of the procedure.    The patient was taken from the operating room to the Surgical Intensive Care Unit, intubated and in good condition.  AN/NUANCE  D:03/29/2019 T:03/29/2019 JOB:[id number]

## 2019-03-29 NOTE — Interval H&P Note (Signed)
History and Physical Interval Note:  03/29/2019 8:15 AM  John [name]  has presented today for surgery, with the diagnosis of CAD.  The various methods of treatment have been discussed with the patient and family. After consideration of risks, benefits and other options for treatment, the patient has consented to  Procedure(s): CORONARY ARTERY BYPASS GRAFTING (CABG) (N/A) TRANSESOPHAGEAL ECHOCARDIOGRAM (TEE) (N/A) as a surgical intervention.  The patient's history has been reviewed, patient examined, no change in status, stable for surgery.  I have reviewed the patient's chart and labs.  Questions were answered to the patient's satisfaction.     Melrose Nakayama

## 2019-03-29 NOTE — Anesthesia Procedure Notes (Signed)
Central Venous Catheter Insertion Performed by: Duane Boston, MD, anesthesiologist Start/End5/04/2019 7:51 AM, 03/29/2019 8:01 AM Patient location: Pre-op. Preanesthetic checklist: patient identified, IV checked, site marked, risks and benefits discussed, surgical consent, monitors and equipment checked, pre-op evaluation, timeout performed and anesthesia consent Position: Trendelenburg Lidocaine 1% used for infiltration and patient sedated Hand hygiene performed , maximum sterile barriers used  and Seldinger technique used Catheter size: 8.5 Fr Total catheter length 8. PA cath was placed.Sheath introducer Swan type:thermodilution PA Cath depth:50 Procedure performed using ultrasound guided technique. Ultrasound Notes:anatomy identified, needle tip was noted to be adjacent to the nerve/plexus identified, no ultrasound evidence of intravascular and/or intraneural injection and image(s) printed for medical record Attempts: 1 Following insertion, line sutured, dressing applied and Biopatch. Post procedure assessment: free fluid flow, blood return through all ports and no air  Patient tolerated the procedure well with no immediate complications.

## 2019-03-29 NOTE — Transfer of Care (Signed)
Immediate Anesthesia Transfer of Care Note  Patient: John [name]  Procedure(s) Performed: CORONARY ARTERY BYPASS GRAFTING (CABG) x 4, using left internal mammary artery and right leg greater saphenous vein harvested endoscopically (N/A Chest) TRANSESOPHAGEAL ECHOCARDIOGRAM (TEE) (N/A )  Patient Location: SICU  Anesthesia Type:General  Level of Consciousness: Patient remains intubated per anesthesia plan  Airway & Oxygen Therapy: Patient remains intubated per anesthesia plan and Patient placed on Ventilator (see vital sign flow sheet for setting)  Post-op Assessment: Report given to RN and Post -op Vital signs reviewed and stable  Post vital signs: Reviewed and stable  Last Vitals:  Vitals Value Taken Time  BP    Temp    Pulse    Resp 10 03/29/2019  1:36 PM  SpO2    Vitals shown include unvalidated device data.  Last Pain:  Vitals:   03/29/19 0654  TempSrc:   PainSc: 0-No pain         Complications: No apparent anesthesia complications

## 2019-03-29 NOTE — Progress Notes (Signed)
PHARMACY NOTE:  ANTIMICROBIAL RENAL DOSAGE ADJUSTMENT  68 yo male s/p CABG. Vancomycin and levaquin have been ordered for surgical prophylaxis -vancomycin 1500mg  and levaquin 500mg  gien at 8:45am today -SCr= 4.83, CrCl ~ 20  Plan -no further antibiotics needed. Last administered dose will cover for more than 24 hours  Hildred Laser, PharmD Clinical Pharmacist **Pharmacist phone directory can now be found on [email or web address] ([email or web address]).  Listed under [place].

## 2019-03-29 NOTE — Anesthesia Procedure Notes (Signed)
Procedure Name: Intubation Date/Time: 03/29/2019 8:44 AM Performed by: Kathryne Hitch, CRNA Pre-anesthesia Checklist: Patient identified, Emergency Drugs available, Suction available, Patient being monitored and Timeout performed Patient Re-evaluated:Patient Re-evaluated prior to induction Oxygen Delivery Method: Circle system utilized Preoxygenation: Pre-oxygenation with 100% oxygen Induction Type: IV induction, Cricoid Pressure applied and Rapid sequence Laryngoscope Size: Miller and 2 Grade View: Grade I Tube type: Oral Tube size: 8.0 mm Number of attempts: 1 Airway Equipment and Method: Patient positioned with wedge pillow and Stylet Placement Confirmation: ETT inserted through vocal cords under direct vision,  positive ETCO2 and breath sounds checked- equal and bilateral Secured at: 23 cm Tube secured with: Tape Dental Injury: Teeth and Oropharynx as per pre-operative assessment

## 2019-03-29 NOTE — Anesthesia Postprocedure Evaluation (Signed)
Anesthesia Post Note  Patient: KENTARIUS PARTINGTON  Procedure(s) Performed: CORONARY ARTERY BYPASS GRAFTING (CABG) x 4, using left internal mammary artery and right leg greater saphenous vein harvested endoscopically (N/A Chest) TRANSESOPHAGEAL ECHOCARDIOGRAM (TEE) (N/A )     Patient location during evaluation: SICU Anesthesia Type: General Level of consciousness: sedated Pain management: pain level controlled Vital Signs Assessment: post-procedure vital signs reviewed and stable Respiratory status: patient remains intubated per anesthesia plan Cardiovascular status: stable Postop Assessment: no apparent nausea or vomiting Anesthetic complications: no    Last Vitals:  Vitals:   03/29/19 1400 03/29/19 1414  BP:    Pulse: 80 80  Resp: 12 12  Temp: (!) 36.2 C (!) 36.1 C  SpO2: 98% 98%    Last Pain:  Vitals:   03/29/19 0654  TempSrc:   PainSc: 0-No pain                 Katelynne Revak DANIEL

## 2019-03-29 NOTE — Progress Notes (Signed)
Pt passed all weaning parameters Nif -35 VC 6 L.  ABG didn't meet parameters RN called Cardiologist on call and was given permission to proceed with extubation.  Pt extubated to 4LPM N/C tolerating well at this time.Marland Kitchen

## 2019-03-29 NOTE — Progress Notes (Signed)
68 year old male who presented with chest pain. He does have significant past medical history for hypertension, dyslipidemia, type 2 diabetes mellitus, coronary artery disease status post RCA stenting, chronic kidney disease stage V status post AV fistula, and peripheral vascular disease status post aortobifemoral bypass admitted to the hospital working diagnosis of NSTEMI.  He underwent CABG this am and is intubated and in ICU. Pt is on  cardio thoracic surgery service.  TRH will sign off at this time.   Hosie Poisson, MD [phone]

## 2019-03-29 NOTE — Progress Notes (Signed)
CT surgery p.m. Rounds  Hemodynamically stable Minimal chest tube output Adequate urine output Still sedated, with attempted ventilator wean patient with respiratory hypercarbic acidosis, back on IMV vent support Lungs clear Patient is responsive but still sleepy Vent wean to be repeated later after sedation wears off

## 2019-03-29 NOTE — Progress Notes (Signed)
Patient's ABG results: pH ( 7.26), pCO2 (46.3), p02 (111), Bicarb: 21.2. Patient passed his weaning parameters. NIF was -35 and V was 6L. MD was paged. Verbal orders were to give 1 amp of Bicarb, extubate, and check another ABG in a hour. Patient was extubated successfully. ABGs are within normal limits. Will continue to monitor.

## 2019-03-29 NOTE — Procedures (Signed)
Extubation Procedure Note  Patient Details:   Name: John [name] DOB: 1951-09-04 MRN: [id number]   Airway Documentation:  Airway 8 mm (Active)  Secured at (cm) 25 cm 03/29/2019  8:00 PM  Measured From Lips 03/29/2019  8:00 PM  [place] 03/29/2019  8:00 PM  Secured By Pink Tape 03/29/2019  8:00 PM  Site Condition Dry 03/29/2019  8:00 PM   Vent end date: 03/29/19 Vent end time: 2045   Evaluation  O2 sats: stable throughout Complications: No apparent complications Patient did tolerate procedure well. Bilateral Breath Sounds: Clear   Yes  Dominga Ferry 03/29/2019, 8:58 PM

## 2019-03-29 NOTE — Progress Notes (Signed)
[facility] NEPHROLOGY PROGRESS NOTE  Assessment/ Plan: Pt is a 68 y.o. yo male with CKD stage V admitted with an NSTEMI status post cardiac cath with multivessel disease.  S/p CABG on 03/29/2019.  #CKD stage V without uremia, due to diabetes and hypertension: Nonoliguric and serum creatinine level mildy trended up to 4.83 today.  Patient received around 50 cc of IV contrast during cath on 03/27/2019 with IVF pre and post-procedure.   -Patient is a status post CABG today and currently intubated.  He is receiving dopamine he.  Blood pressure acceptable.  He made around 650 cc of urine output.  Repeating lab in the evening.  Has AV fistula in case if we need to start on dialysis.  Volume status acceptable currently.  #Hyperkalemia: Mild elevation in potassium today.  Repeating lab in the evening.  Recommend medical treatment and may need IV Lasix.  #NSTEMI with multi-vessel disease: status post cardiac cath on 03/27/2019 with three-vessel disease.  CABG today.   #Hypertension: Monitor blood pressure.  Continue current medications  #Mild anemia: Monitor hemoglobin.   Subjective: Seen and examined in ICU.  Patient is currently intubated and sedated after the surgery.  Has a Foley catheter with clear urine.  On IV dopamine.  Discussed with ICU nurse.  Objective Vital signs in last 24 hours: Vitals:   03/29/19 1534 03/29/19 1539 03/29/19 1600 03/29/19 1605  BP:  109/74 104/69   Pulse: 80  80 80  Resp: 14  12 13   Temp: (!) 96.8 F (36 C)  (!) 97 F (36.1 C) (!) 97 F (36.1 C)  TempSrc:      SpO2: 100%  100% 100%  Weight:      Height:       Weight change: -1.225 kg  Intake/Output Summary (Last 24 hours) at 03/29/2019 1656 Last data filed at 03/29/2019 1600 Gross per 24 hour  Intake 4183.26 ml  Output 4150 ml  Net 33.26 ml       Labs: Basic Metabolic Panel: Recent Labs  Lab 03/27/19 0538 03/28/19 0701 03/29/19 0240  03/29/19 1159 03/29/19 1235 03/29/19 1239  03/29/19 1344  NA 142 141 142   < > 137 139 139 141  K 4.1 4.6 4.3   < > 6.0* 5.7* 5.7* 5.4*  CL 105 105 106  --   --   --   --   --   CO2 26 23 23   --   --   --   --   --   GLUCOSE 150* 147* 140*   < > 212*  --  188* 143*  BUN 59* 56* 59*  --   --   --   --   --   CREATININE 4.46* 4.67* 4.83*  --   --   --   --   --   CALCIUM 9.5 9.8 10.0  --   --   --   --   --    < > = values in this interval not displayed.   Liver Function Tests: Recent Labs  Lab 03/29/19 0240  AST 30  ALT 37  ALKPHOS 58  BILITOT 0.6  PROT 6.3*  ALBUMIN 3.3*   No results for input(s): LIPASE, AMYLASE in the last 168 hours. No results for input(s): AMMONIA in the last 168 hours. CBC: Recent Labs  Lab 03/26/19 0504 03/27/19 0538 03/28/19 0701 03/29/19 0240  03/29/19 1132  03/29/19 1235 03/29/19 1239 03/29/19 1344  WBC 7.6 6.0 7.6 7.2  --   --   --   --   --  9.5  HGB 10.9* 10.7* 11.6* 11.3*   < > 8.4*   < > 8.8* 9.9* 10.4*  9.5*  HCT 33.6* 33.2* 35.9* 35.4*   < > 25.9*   < > 26.0* 29.0* 32.5*  28.0*  MCV 92.1 92.0 92.5 92.7  --   --   --   --   --  94.2  PLT 159 156 167 168  --  146*  --   --   --  109*   < > = values in this interval not displayed.   Cardiac Enzymes: Recent Labs  Lab 03/24/19 0945 03/24/19 1600 03/24/19 2149  TROPONINI 3.78* 4.51* 4.16*   CBG: Recent Labs  Lab 03/28/19 1654 03/28/19 2059 03/29/19 0623 03/29/19 1413 03/29/19 1501  GLUCAP 115* 154* 144* 112* 92    Iron Studies: No results for input(s): IRON, TIBC, TRANSFERRIN, FERRITIN in the last 72 hours. Studies/Results: Dg Chest Port 1 View  Result Date: 03/29/2019 CLINICAL DATA:  Chest pain. EXAM: PORTABLE CHEST 1 VIEW COMPARISON:  03/29/2019 FINDINGS: The patient is now status post median sternotomy. The right sided Swan-Ganz catheter tip projects over the main right pulmonary artery. Endotracheal tube terminates above the carina. The enteric tube extends below the left hemidiaphragm. Mediastinal drains/chest  tubes are in place. No large pneumothorax. There is mild vascular congestion. The cardiac silhouette remains enlarged. There are likely trace bilateral pleural effusions. IMPRESSION: 1. Lines and tubes as above. 2. Postoperative chest with no evidence of a pneumothorax. A minimal amount of postoperative atelectasis is noted at the lung bases. Electronically Signed   By: Constance Holster M.D.   On: 03/29/2019 13:53   Dg Chest Port 1 View  Result Date: 03/29/2019 CLINICAL DATA:  Preop EXAM: PORTABLE CHEST 1 VIEW COMPARISON:  03/23/2019 FINDINGS: Cardiomegaly. No confluent airspace opacities, effusions or edema. No acute bony abnormality. IMPRESSION: Cardiomegaly.  No active disease. Electronically Signed   By: Rolm Baptise M.D.   On: 03/29/2019 02:02    Medications: Infusions: . sodium chloride 10 mL/hr at 03/29/19 1600  . [START ON 03/30/2019] sodium chloride    . sodium chloride 10 mL/hr at 03/29/19 1418  . albumin human    . dexmedetomidine (PRECEDEX) IV infusion Stopped (03/29/19 1555)  . DOPamine 3 mcg/kg/min (03/29/19 1600)  . famotidine (PEPCID) IV    . insulin 1.5 mL/hr at 03/29/19 1600  . lactated ringers    . lactated ringers    . lactated ringers 20 mL/hr at 03/29/19 1600  . magnesium sulfate    . nitroGLYCERIN    . phenylephrine (NEO-SYNEPHRINE) Adult infusion Stopped (03/29/19 1532)    Scheduled Medications: . [START ON 03/30/2019] acetaminophen  1,000 mg Oral Q6H   Or  . [START ON 03/30/2019] acetaminophen (TYLENOL) oral liquid 160 mg/5 mL  1,000 mg Per Tube Q6H  . [START ON 03/30/2019] aspirin EC  325 mg Oral Daily   Or  . [START ON 03/30/2019] aspirin  324 mg Per Tube Daily  . atorvastatin  40 mg Oral q1800  . [START ON 03/30/2019] bisacodyl  10 mg Oral Daily   Or  . [START ON 03/30/2019] bisacodyl  10 mg Rectal Daily  . calcitRIOL  0.5 mcg Oral Daily  . Chlorhexidine Gluconate Cloth  6 each Topical Daily  . [START ON 03/30/2019] docusate sodium  200 mg Oral Daily  . gabapentin   200 mg Oral QHS  . insulin regular  0-10 Units Intravenous TID WC  . metoCLOPramide (REGLAN) injection  10  mg Intravenous Q6H  . metoprolol tartrate  12.5 mg Oral BID   Or  . metoprolol tartrate  12.5 mg Per Tube BID  . mupirocin ointment   Nasal BID  . [START ON 03/31/2019] pantoprazole  40 mg Oral Daily  . [START ON 03/30/2019] sodium chloride flush  3 mL Intravenous Q12H  . tamsulosin  0.4 mg Oral QAC supper    have reviewed scheduled and prn medications.  Physical Exam: General:. Sedated and intubated.  Opening eyes with the name. Heart:RRR, s1s2 nl, no rubs, Lungs: Breath sound with mechanical ventilation, no wheezing Abdomen:soft,  non-distended Extremities:No edema Dialysis Access: Right AV fistula is mature and has a good thrill and bruit. Foley catheter with clear urine.  Chima Astorino Prasad Troy Hartzog 03/29/2019,4:56 PM  LOS: 5 days

## 2019-03-29 NOTE — Anesthesia Procedure Notes (Signed)
Arterial Line Insertion Start/End5/04/2019 8:00 AM Performed by: Kathryne Hitch, CRNA, CRNA  Patient location: Pre-op. Preanesthetic checklist: patient identified, IV checked, site marked, risks and benefits discussed and monitors and equipment checked Lidocaine 1% used for infiltration Left, radial was placed Catheter size: 20 G Hand hygiene performed  and maximum sterile barriers used   Attempts: 1 Procedure performed without using ultrasound guided technique. Following insertion, dressing applied and Biopatch. Post procedure assessment: normal  Patient tolerated the procedure well with no immediate complications.

## 2019-03-30 ENCOUNTER — Inpatient Hospital Stay (HOSPITAL_COMMUNITY): Payer: Medicare Other

## 2019-03-30 ENCOUNTER — Encounter (HOSPITAL_COMMUNITY): Payer: Self-pay | Admitting: Thoracic Surgery (Cardiothoracic Vascular Surgery)

## 2019-03-30 LAB — BASIC METABOLIC PANEL
Anion gap: 10 (ref 5–15)
Anion gap: 10 (ref 5–15)
BUN: 58 mg/dL — ABNORMAL HIGH (ref 8–23)
BUN: 59 mg/dL — ABNORMAL HIGH (ref 8–23)
CO2: 20 mmol/L — ABNORMAL LOW (ref 22–32)
CO2: 20 mmol/L — ABNORMAL LOW (ref 22–32)
Calcium: 9.2 mg/dL (ref 8.9–10.3)
Calcium: 9.4 mg/dL (ref 8.9–10.3)
Chloride: 110 mmol/L (ref 98–111)
Chloride: 107 mmol/L (ref 98–111)
Creatinine, Ser: 5.17 mg/dL — ABNORMAL HIGH (ref 0.61–1.24)
Creatinine, Ser: 5.46 mg/dL — ABNORMAL HIGH (ref 0.61–1.24)
GFR calc Af Amer: 13 mL/min — ABNORMAL LOW (ref >60)
GFR calc Af Amer: 12 mL/min — ABNORMAL LOW (ref >60)
GFR calc non Af Amer: 11 mL/min — ABNORMAL LOW (ref >60)
GFR calc non Af Amer: 10 mL/min — ABNORMAL LOW (ref >60)
Glucose, Bld: 110 mg/dL — ABNORMAL HIGH (ref 70–99)
Glucose, Bld: 152 mg/dL — ABNORMAL HIGH (ref 70–99)
Potassium: 5.2 mmol/L — ABNORMAL HIGH (ref 3.5–5.1)
Potassium: 5.3 mmol/L — ABNORMAL HIGH (ref 3.5–5.1)
Sodium: 140 mmol/L (ref 135–145)
Sodium: 137 mmol/L (ref 135–145)

## 2019-03-30 LAB — GLUCOSE, CAPILLARY
Glucose-Capillary: 111 mg/dL — ABNORMAL HIGH (ref 70–99)
Glucose-Capillary: 112 mg/dL — ABNORMAL HIGH (ref 70–99)
Glucose-Capillary: 123 mg/dL — ABNORMAL HIGH (ref 70–99)
Glucose-Capillary: 130 mg/dL — ABNORMAL HIGH (ref 70–99)
Glucose-Capillary: 136 mg/dL — ABNORMAL HIGH (ref 70–99)
Glucose-Capillary: 137 mg/dL — ABNORMAL HIGH (ref 70–99)
Glucose-Capillary: 137 mg/dL — ABNORMAL HIGH (ref 70–99)
Glucose-Capillary: 139 mg/dL — ABNORMAL HIGH (ref 70–99)
Glucose-Capillary: 139 mg/dL — ABNORMAL HIGH (ref 70–99)
Glucose-Capillary: 144 mg/dL — ABNORMAL HIGH (ref 70–99)
Glucose-Capillary: 150 mg/dL — ABNORMAL HIGH (ref 70–99)
Glucose-Capillary: 159 mg/dL — ABNORMAL HIGH (ref 70–99)
Glucose-Capillary: 169 mg/dL — ABNORMAL HIGH (ref 70–99)
Glucose-Capillary: 172 mg/dL — ABNORMAL HIGH (ref 70–99)
Glucose-Capillary: 188 mg/dL — ABNORMAL HIGH (ref 70–99)
Glucose-Capillary: 203 mg/dL — ABNORMAL HIGH (ref 70–99)

## 2019-03-30 LAB — CBC
HCT: 32.3 % — ABNORMAL LOW (ref 39.0–52.0)
HCT: 31.2 % — ABNORMAL LOW (ref 39.0–52.0)
Hemoglobin: 10.2 g/dL — ABNORMAL LOW (ref 13.0–17.0)
Hemoglobin: 9.7 g/dL — ABNORMAL LOW (ref 13.0–17.0)
MCH: 29.9 pg (ref 26.0–34.0)
MCH: 29.8 pg (ref 26.0–34.0)
MCHC: 31.6 g/dL (ref 30.0–36.0)
MCHC: 31.1 g/dL (ref 30.0–36.0)
MCV: 94.7 fL (ref 80.0–100.0)
MCV: 96 fL (ref 80.0–100.0)
Platelets: 119 10*3/uL — ABNORMAL LOW (ref 150–400)
Platelets: 106 10*3/uL — ABNORMAL LOW (ref 150–400)
RBC: 3.41 MIL/uL — ABNORMAL LOW (ref 4.22–5.81)
RBC: 3.25 MIL/uL — ABNORMAL LOW (ref 4.22–5.81)
RDW: 13.3 % (ref 11.5–15.5)
RDW: 13.5 % (ref 11.5–15.5)
WBC: 13.7 10*3/uL — ABNORMAL HIGH (ref 4.0–10.5)
WBC: 12.6 10*3/uL — ABNORMAL HIGH (ref 4.0–10.5)
nRBC: 0 % (ref 0.0–0.2)
nRBC: 0 % (ref 0.0–0.2)

## 2019-03-30 LAB — MAGNESIUM
Magnesium: 2.1 mg/dL (ref 1.7–2.4)
Magnesium: 2.2 mg/dL (ref 1.7–2.4)

## 2019-03-30 MED ORDER — INSULIN DETEMIR 100 UNIT/ML ~~LOC~~ SOLN
20.0000 [IU] | Freq: Two times a day (BID) | SUBCUTANEOUS | Status: DC
  Administered 2019-03-30 – 2019-03-31 (×4): 20 [IU] via SUBCUTANEOUS
  Filled 2019-03-30 (×6): qty 0.2

## 2019-03-30 MED ORDER — INSULIN DETEMIR 100 UNIT/ML ~~LOC~~ SOLN
20.0000 [IU] | Freq: Once | SUBCUTANEOUS | Status: DC
  Filled 2019-03-30: qty 0.2

## 2019-03-30 MED ORDER — INSULIN DETEMIR 100 UNIT/ML ~~LOC~~ SOLN
20.0000 [IU] | Freq: Two times a day (BID) | SUBCUTANEOUS | Status: DC
  Filled 2019-03-30 (×2): qty 0.2

## 2019-03-30 MED ORDER — PATIROMER SORBITEX CALCIUM 8.4 G PO PACK
8.4000 g | PACK | Freq: Every day | ORAL | Status: DC
  Administered 2019-03-30 – 2019-04-02 (×4): 8.4 g via ORAL
  Filled 2019-03-30 (×6): qty 1

## 2019-03-30 MED ORDER — FUROSEMIDE 10 MG/ML IJ SOLN
40.0000 mg | Freq: Once | INTRAMUSCULAR | Status: AC
  Administered 2019-03-30: 23:00:00 40 mg via INTRAVENOUS
  Filled 2019-03-30: qty 4

## 2019-03-30 MED ORDER — FUROSEMIDE 10 MG/ML IJ SOLN
40.0000 mg | Freq: Two times a day (BID) | INTRAMUSCULAR | Status: AC
  Administered 2019-03-30 (×2): 40 mg via INTRAVENOUS
  Filled 2019-03-30 (×2): qty 4

## 2019-03-30 MED ORDER — ENOXAPARIN SODIUM 30 MG/0.3ML ~~LOC~~ SOLN
30.0000 mg | Freq: Every day | SUBCUTANEOUS | Status: DC
  Administered 2019-03-30: 30 mg via SUBCUTANEOUS
  Filled 2019-03-30: qty 0.3

## 2019-03-30 MED ORDER — INSULIN ASPART 100 UNIT/ML ~~LOC~~ SOLN
0.0000 [IU] | SUBCUTANEOUS | Status: DC
  Administered 2019-03-30: 2 [IU] via SUBCUTANEOUS
  Administered 2019-03-30: 4 [IU] via SUBCUTANEOUS
  Administered 2019-03-30 (×2): 2 [IU] via SUBCUTANEOUS
  Administered 2019-03-31: 8 [IU] via SUBCUTANEOUS

## 2019-03-30 MED ORDER — INSULIN DETEMIR 100 UNIT/ML ~~LOC~~ SOLN: 20.0000 [IU] | Freq: Every day | SUBCUTANEOUS | Status: DC

## 2019-03-30 NOTE — Progress Notes (Signed)
      [address]       [place] [address]             [phone]       Resting comfortably at present. Some nausea earlier- Zofran helped  BP (!) 144/70   Pulse 85   Temp 98.4 F (36.9 C) (Oral)   Resp 14   Ht 6\' 2"  (1.88 m)   Wt 97.1 kg   SpO2 97%   BMI 27.48 kg/m   Intake/Output Summary (Last 24 hours) at 03/30/2019 1759 Last data filed at 03/30/2019 1700 Gross per 24 hour  Intake 1536.32 ml  Output 1594 ml  Net -57.68 ml   UOP about 500 since 0700  Will give additional dose of lasix this evening  Creatinine up to 5.46, K=5.3 HCT= 31  Overall doing well  Remo Lipps C. Roxan Hockey, MD [facility] [phone]

## 2019-03-30 NOTE — Progress Notes (Signed)
[facility] NEPHROLOGY PROGRESS NOTE  Assessment/ Plan: Pt is a 68 y.o. yo male with CKD stage V admitted with an NSTEMI status post cardiac cath with multivessel disease.  S/p CABG on 03/29/2019.  #CKD stage V without uremia, due to diabetes and hypertension:  -Serum creatinine level trending up to 5.17 today likely due to IV contrast and hemodynamic changes during surgery.  Patient is on low-dose dopamine and nonoliguric.  Mild elevation in potassium likely due to LR, recommend to avoid potassium containing IV fluid.  Agree with Lasix.  Patient denies any uremic symptoms today.  He is successfully extubated.  No need for dialysis. -He has AV fistula in case if we need to start on dialysis.    #Hyperkalemia: K5.2 today, recommend to avoid LR.  On Lasix.  Monitor lab.  I will order a dose of Veltassa.  #NSTEMI with multi-vessel disease: status post cardiac cath on 03/27/2019 with three-vessel disease.  S/p CABG on 03/29/2019.  #Hypertension: Monitor blood pressure.  Continue current medications  #Mild anemia: Monitor hemoglobin.   Subjective: Seen and examined in ICU.  Patient is extubated.  On low-dose dopamine.  Denies chest pain, shortness of breath, nausea, vomiting.  Urine output acceptable. Objective Vital signs in last 24 hours: Vitals:   03/30/19 0745 03/30/19 0800 03/30/19 0815 03/30/19 0830  BP:  (!) 129/94    Pulse:      Resp: 13 17 15 12   Temp: 98.4 F (36.9 C) 98.1 F (36.7 C) 98.1 F (36.7 C) 98.4 F (36.9 C)  TempSrc:      SpO2:      Weight:      Height:       Weight change: 3.568 kg  Intake/Output Summary (Last 24 hours) at 03/30/2019 0842 Last data filed at 03/30/2019 0800 Gross per 24 hour  Intake 5042.89 ml  Output 3614 ml  Net 1428.89 ml       Labs: Basic Metabolic Panel: Recent Labs  Lab 03/29/19 0240  03/29/19 1344  03/29/19 1840 03/29/19 2023 03/29/19 2228 03/30/19 0447  NA 142   < > 141   < > 141 142 141 140  K 4.3   < > 5.4*    < > 5.1 5.2* 5.4* 5.2*  CL 106  --   --   --  109  --   --  110  CO2 23  --   --   --  19*  --   --  20*  GLUCOSE 140*   < > 143*  --  132*  --   --  110*  BUN 59*  --   --   --  54*  --   --  58*  CREATININE 4.83*  --   --   --  4.57*  --   --  5.17*  CALCIUM 10.0  --   --   --  9.1  --   --  9.2   < > = values in this interval not displayed.   Liver Function Tests: Recent Labs  Lab 03/29/19 0240  AST 30  ALT 37  ALKPHOS 58  BILITOT 0.6  PROT 6.3*  ALBUMIN 3.3*   No results for input(s): LIPASE, AMYLASE in the last 168 hours. No results for input(s): AMMONIA in the last 168 hours. CBC: Recent Labs  Lab 03/28/19 0701 03/29/19 0240  03/29/19 1344  03/29/19 1840 03/29/19 2023 03/29/19 2228 03/30/19 0447  WBC 7.6 7.2  --  9.5  --  14.5*  --   --  13.7*  HGB 11.6* 11.3*   < > 10.4*  9.5*   < > 11.4* 11.2* 10.9* 10.2*  HCT 35.9* 35.4*   < > 32.5*  28.0*   < > 36.2* 33.0* 32.0* 32.3*  MCV 92.5 92.7  --  94.2  --  94.0  --   --  94.7  PLT 167 168   < > 109*  --  137*  --   --  119*   < > = values in this interval not displayed.   Cardiac Enzymes: Recent Labs  Lab 03/24/19 0945 03/24/19 1600 03/24/19 2149  TROPONINI 3.78* 4.51* 4.16*   CBG: Recent Labs  Lab 03/30/19 0259 03/30/19 0438 03/30/19 0545 03/30/19 0651 03/30/19 0755  GLUCAP 144* 111* 130* 137* 137*    Iron Studies: No results for input(s): IRON, TIBC, TRANSFERRIN, FERRITIN in the last 72 hours. Studies/Results: Dg Chest Port 1 View  Result Date: 03/30/2019 CLINICAL DATA:  Status post CABG.  Chest tube present. EXAM: PORTABLE CHEST 1 VIEW COMPARISON:  None. FINDINGS: The heart remains enlarged. Median sternotomy for CABG appears uncomplicated. Swan-Ganz catheter tip RIGHT main pulmonary artery, RIGHT IJ approach, no pneumothorax. Moderate vascular congestion. Low lung volumes. Thoracic atherosclerosis. Worsening LEFT basilar atelectasis and effusion. Mediastinal tube. ET tube and NG tube have been  removed. IMPRESSION: Worsening aeration. Low lung volumes with increasing vascular congestion and LEFT basilar atelectasis. Electronically Signed   By: Staci Righter M.D.   On: 03/30/2019 08:11   Dg Chest Port 1 View  Result Date: 03/29/2019 CLINICAL DATA:  Chest pain. EXAM: PORTABLE CHEST 1 VIEW COMPARISON:  03/29/2019 FINDINGS: The patient is now status post median sternotomy. The right sided Swan-Ganz catheter tip projects over the main right pulmonary artery. Endotracheal tube terminates above the carina. The enteric tube extends below the left hemidiaphragm. Mediastinal drains/chest tubes are in place. No large pneumothorax. There is mild vascular congestion. The cardiac silhouette remains enlarged. There are likely trace bilateral pleural effusions. IMPRESSION: 1. Lines and tubes as above. 2. Postoperative chest with no evidence of a pneumothorax. A minimal amount of postoperative atelectasis is noted at the lung bases. Electronically Signed   By: Constance Holster M.D.   On: 03/29/2019 13:53   Dg Chest Port 1 View  Result Date: 03/29/2019 CLINICAL DATA:  Preop EXAM: PORTABLE CHEST 1 VIEW COMPARISON:  03/23/2019 FINDINGS: Cardiomegaly. No confluent airspace opacities, effusions or edema. No acute bony abnormality. IMPRESSION: Cardiomegaly.  No active disease. Electronically Signed   By: Rolm Baptise M.D.   On: 03/29/2019 02:02    Medications: Infusions: . sodium chloride    . albumin human    . dexmedetomidine (PRECEDEX) IV infusion Stopped (03/29/19 1555)  . DOPamine 3 mcg/kg/min (03/30/19 0800)  . insulin 2.9 mL/hr at 03/30/19 0800  . lactated ringers    . lactated ringers    . lactated ringers 20 mL/hr at 03/30/19 0800    Scheduled Medications: . acetaminophen  1,000 mg Oral Q6H   Or  . acetaminophen (TYLENOL) oral liquid 160 mg/5 mL  1,000 mg Per Tube Q6H  . aspirin EC  325 mg Oral Daily   Or  . aspirin  324 mg Per Tube Daily  . atorvastatin  40 mg Oral q1800  . bisacodyl  10  mg Oral Daily   Or  . bisacodyl  10 mg Rectal Daily  . calcitRIOL  0.5 mcg Oral Daily  . chlorhexidine gluconate (MEDLINE KIT)  15 mL Mouth Rinse BID  . Chlorhexidine Gluconate Cloth  6 each Topical Daily  . docusate sodium  200 mg Oral Daily  . enoxaparin (LOVENOX) injection  30 mg Subcutaneous QHS  . furosemide  40 mg Intravenous BID  . gabapentin  200 mg Oral QHS  . insulin aspart  0-24 Units Subcutaneous Q4H  . [START ON 03/31/2019] insulin detemir  20 Units Subcutaneous Daily  . insulin detemir  20 Units Subcutaneous BID  . insulin regular  0-10 Units Intravenous TID WC  . metoCLOPramide (REGLAN) injection  10 mg Intravenous Q6H  . metoprolol tartrate  12.5 mg Oral BID   Or  . metoprolol tartrate  12.5 mg Per Tube BID  . mupirocin ointment   Nasal BID  . [START ON 03/31/2019] pantoprazole  40 mg Oral Daily  . sodium chloride flush  3 mL Intravenous Q12H  . tamsulosin  0.4 mg Oral QAC supper    have reviewed scheduled and prn medications.  Physical Exam: General:.  Lying on bed, not in distress Heart:RRR, s1s2 nl, no rubs, Lungs: Clear bilateral, no wheezing or crackle Abdomen:soft,  non-distended Extremities:No edema Dialysis Access: Right AV fistula is mature and has a good thrill and bruit. Foley catheter with clear urine.  Dron Prasad Bhandari 03/30/2019,8:42 AM  LOS: 6 days

## 2019-03-30 NOTE — Plan of Care (Signed)
  Problem: Education: Goal: Understanding of cardiac disease, CV risk reduction, and recovery process will improve Outcome: Progressing   Problem: Activity: Goal: Ability to tolerate increased activity will improve Outcome: Progressing   Problem: Cardiac: Goal: Ability to achieve and maintain adequate cardiovascular perfusion will improve Outcome: Progressing   Problem: Clinical Measurements: Goal: Ability to maintain clinical measurements within normal limits will improve Outcome: Progressing Goal: Respiratory complications will improve Outcome: Progressing Goal: Cardiovascular complication will be avoided Outcome: Progressing   Problem: Nutrition: Goal: Adequate nutrition will be maintained Outcome: Progressing   Problem: Coping: Goal: Level of anxiety will decrease Outcome: Progressing   Problem: Elimination: Goal: Will not experience complications related to urinary retention Outcome: Progressing   Problem: Pain Managment: Goal: General experience of comfort will improve Outcome: Progressing   Problem: Safety: Goal: Ability to remain free from injury will improve Outcome: Progressing   Problem: Skin Integrity: Goal: Risk for impaired skin integrity will decrease Outcome: Progressing

## 2019-03-30 NOTE — Progress Notes (Addendum)
[facility] DAILY ICU PROGRESS NOTE                   [address]            [place], [address]          [phone]   1 Day Post-Op Procedure(s) (LRB): CORONARY ARTERY BYPASS GRAFTING (CABG) x 4, using left internal mammary artery and right leg greater saphenous vein harvested endoscopically (N/A) TRANSESOPHAGEAL ECHOCARDIOGRAM (TEE) (N/A)  Total Length of Stay:  LOS: 6 days   Subjective:  Patient doing okay.  Has some pain but get relief with medications.  Had nausea when they sat him up this morning.  Objective: Vital signs in last 24 hours: Temp:  [96.8 F (36 C)-99.3 F (37.4 C)] 98.2 F (36.8 C) (05/07 0730) Pulse Rate:  [70-86] 86 (05/07 0600) Cardiac Rhythm: Normal sinus rhythm (05/07 0730) Resp:  [9-25] 12 (05/07 0730) BP: (91-128)/(51-74) 122/63 (05/07 0700) SpO2:  [98 %-100 %] 99 % (05/07 0600) Arterial Line BP: (105-284)/(4-67) 143/46 (05/07 0730) FiO2 (%):  [40 %-50 %] 40 % (05/06 2000) Weight:  [97.1 kg] 97.1 kg (05/07 0500)  Filed Weights   03/28/19 0502 03/29/19 0557 03/30/19 0500  Weight: 94.8 kg 93.5 kg 97.1 kg    Weight change: 3.568 kg   Hemodynamic parameters for last 24 hours: PAP: (15-31)/(6-27) 21/18 CO:  [4.9 L/min-7.4 L/min] 6.8 L/min CI:  [2.2 L/min/m2-3.4 L/min/m2] 3.1 L/min/m2  Intake/Output from previous day: 05/06 0701 - 05/07 0700 In: 5004.8 [P.O.:444; I.V.:3160.8; Blood:900; IV Piggyback:500] Out: 6060 [Urine:1395; Blood:1700; Chest Tube:459]  Current Meds: Scheduled Meds: . acetaminophen  1,000 mg Oral Q6H   Or  . acetaminophen (TYLENOL) oral liquid 160 mg/5 mL  1,000 mg Per Tube Q6H  . aspirin EC  325 mg Oral Daily   Or  . aspirin  324 mg Per Tube Daily  . atorvastatin  40 mg Oral q1800  . bisacodyl  10 mg Oral Daily   Or  . bisacodyl  10 mg Rectal Daily  . calcitRIOL  0.5 mcg Oral Daily  . chlorhexidine gluconate (MEDLINE KIT)  15 mL Mouth Rinse BID  . Chlorhexidine Gluconate Cloth  6 each Topical Daily  .  docusate sodium  200 mg Oral Daily  . enoxaparin (LOVENOX) injection  30 mg Subcutaneous QHS  . gabapentin  200 mg Oral QHS  . insulin aspart  0-24 Units Subcutaneous Q4H  . insulin detemir  20 Units Subcutaneous Once  . [START ON 03/31/2019] insulin detemir  20 Units Subcutaneous Daily  . insulin regular  0-10 Units Intravenous TID WC  . metoCLOPramide (REGLAN) injection  10 mg Intravenous Q6H  . metoprolol tartrate  12.5 mg Oral BID   Or  . metoprolol tartrate  12.5 mg Per Tube BID  . mupirocin ointment   Nasal BID  . [START ON 03/31/2019] pantoprazole  40 mg Oral Daily  . sodium chloride flush  3 mL Intravenous Q12H  . tamsulosin  0.4 mg Oral QAC supper   Continuous Infusions: . sodium chloride 10 mL/hr at 03/30/19 0700  . sodium chloride    . sodium chloride 10 mL/hr at 03/29/19 1700  . albumin human    . dexmedetomidine (PRECEDEX) IV infusion Stopped (03/29/19 1555)  . DOPamine 3 mcg/kg/min (03/30/19 0700)  . insulin 2.9 mL/hr at 03/30/19 0700  . lactated ringers    . lactated ringers    . lactated ringers 20 mL/hr at 03/30/19 0700  .  magnesium sulfate    . nitroGLYCERIN    . phenylephrine (NEO-SYNEPHRINE) Adult infusion Stopped (03/29/19 1916)   PRN Meds:.sodium chloride, albumin human, lactated ringers, metoprolol tartrate, midazolam, morphine injection, ondansetron (ZOFRAN) IV, oxyCODONE, sodium chloride flush, sodium chloride flush, traMADol  General appearance: alert, cooperative and no distress Heart: regular rate and rhythm Lungs: clear to auscultation bilaterally Abdomen: soft, non-tender; bowel sounds normal; no masses,  no organomegaly Extremities: edema trace Wound: clean and dry, ecchymosis of RLE due to [facility]  Lab Results: CBC: Recent Labs    03/29/19 1840  03/29/19 2228 03/30/19 0447  WBC 14.5*  --   --  13.7*  HGB 11.4*   < > 10.9* 10.2*  HCT 36.2*   < > 32.0* 32.3*  PLT 137*  --   --  119*   < > = values in this interval not displayed.   BMET:   Recent Labs    03/29/19 1840  03/29/19 2228 03/30/19 0447  NA 141   < > 141 140  K 5.1   < > 5.4* 5.2*  CL 109  --   --  110  CO2 19*  --   --  20*  GLUCOSE 132*  --   --  110*  BUN 54*  --   --  58*  CREATININE 4.57*  --   --  5.17*  CALCIUM 9.1  --   --  9.2   < > = values in this interval not displayed.    CMET: Lab Results  Component Value Date   WBC 13.7 (H) 03/30/2019   HGB 10.2 (L) 03/30/2019   HCT 32.3 (L) 03/30/2019   PLT 119 (L) 03/30/2019   GLUCOSE 110 (H) 03/30/2019   CHOL 99 03/25/2019   TRIG 80 03/25/2019   HDL 39 (L) 03/25/2019   LDLCALC 44 03/25/2019   ALT 37 03/29/2019   AST 30 03/29/2019   NA 140 03/30/2019   K 5.2 (H) 03/30/2019   CL 110 03/30/2019   CREATININE 5.17 (H) 03/30/2019   BUN 58 (H) 03/30/2019   CO2 20 (L) 03/30/2019   INR 1.4 (H) 03/29/2019   HGBA1C 7.5 (H) 03/25/2019      PT/INR:  Recent Labs    03/29/19 1344  LABPROT 16.5*  INR 1.4*   Radiology: Dg Chest Port 1 View  Result Date: 03/29/2019 CLINICAL DATA:  Chest pain. EXAM: PORTABLE CHEST 1 VIEW COMPARISON:  03/29/2019 FINDINGS: The patient is now status post median sternotomy. The right sided Swan-Ganz catheter tip projects over the main right pulmonary artery. Endotracheal tube terminates above the carina. The enteric tube extends below the left hemidiaphragm. Mediastinal drains/chest tubes are in place. No large pneumothorax. There is mild vascular congestion. The cardiac silhouette remains enlarged. There are likely trace bilateral pleural effusions. IMPRESSION: 1. Lines and tubes as above. 2. Postoperative chest with no evidence of a pneumothorax. A minimal amount of postoperative atelectasis is noted at the lung bases. Electronically Signed   By: Constance Holster M.D.   On: 03/29/2019 13:53     Assessment/Plan: S/P Procedure(s) (LRB): CORONARY ARTERY BYPASS GRAFTING (CABG) x 4, using left internal mammary artery and right leg greater saphenous vein harvested  endoscopically (N/A) TRANSESOPHAGEAL ECHOCARDIOGRAM (TEE) (N/A)  1. CV- NSR, SBP in the 140s which is okay with CKD- low dose Lopressor as able 2. Pulm- bilateral atelectasis, small left pleural effusion, continue IS, wean oxygen as tolerated 3. Renal- CKD Stage IV- creatinine up to 5.17, U/O about 20-25  cc per hour, on renal dose Dopamine, watch K which is 5.2.Marland KitchenMarland KitchenMarland Kitchen no supplementation ordered, possible Nephrology consult 4. Expected post operative blood loss anemia, mild at 10.2 5. Expected post operative thrombocytopenia- 119, monitor 6. DM- remains on insulin drip,  Will start Levemir at 20 U today, and start SSIP 7. Dispo- patient stable, in NSR, low dose BB as able, Stage IV CKD, creatinine up to 5.17, K at 5.2, U/O 25 cc per hour.. will likely need Nephrology consult, monitor closely, start insulin and wean insulin drip, POD #1 progression orders     Erin Barrett 03/30/2019 8:00 AM   Patient seen and examined, agree with above Dc swan, A line, foley Will continue dopamine for now Creatinine up as expected, will try to diurese gently today Enoxaparin + SCD for DVT prophylaxis  Remo Lipps C. Roxan Hockey, MD [facility] [phone]

## 2019-03-31 ENCOUNTER — Inpatient Hospital Stay (HOSPITAL_COMMUNITY): Payer: Medicare Other

## 2019-03-31 LAB — CBC
HCT: 29.8 % — ABNORMAL LOW (ref 39.0–52.0)
Hemoglobin: 9.3 g/dL — ABNORMAL LOW (ref 13.0–17.0)
MCH: 29.9 pg (ref 26.0–34.0)
MCHC: 31.2 g/dL (ref 30.0–36.0)
MCV: 95.8 fL (ref 80.0–100.0)
Platelets: 87 10*3/uL — ABNORMAL LOW (ref 150–400)
RBC: 3.11 MIL/uL — ABNORMAL LOW (ref 4.22–5.81)
RDW: 13.6 % (ref 11.5–15.5)
WBC: 10.6 10*3/uL — ABNORMAL HIGH (ref 4.0–10.5)
nRBC: 0 % (ref 0.0–0.2)

## 2019-03-31 LAB — BASIC METABOLIC PANEL
Anion gap: 10 (ref 5–15)
BUN: 60 mg/dL — ABNORMAL HIGH (ref 8–23)
CO2: 21 mmol/L — ABNORMAL LOW (ref 22–32)
Calcium: 9.6 mg/dL (ref 8.9–10.3)
Chloride: 107 mmol/L (ref 98–111)
Creatinine, Ser: 5.49 mg/dL — ABNORMAL HIGH (ref 0.61–1.24)
GFR calc Af Amer: 12 mL/min — ABNORMAL LOW (ref >60)
GFR calc non Af Amer: 10 mL/min — ABNORMAL LOW (ref >60)
Glucose, Bld: 125 mg/dL — ABNORMAL HIGH (ref 70–99)
Potassium: 4.7 mmol/L (ref 3.5–5.1)
Sodium: 138 mmol/L (ref 135–145)

## 2019-03-31 LAB — POCT I-STAT 4, (NA,K, GLUC, HGB,HCT)
Glucose, Bld: 146 mg/dL — ABNORMAL HIGH (ref 70–99)
HCT: 29 % — ABNORMAL LOW (ref 39.0–52.0)
Hemoglobin: 9.9 g/dL — ABNORMAL LOW (ref 13.0–17.0)
Potassium: 5.2 mmol/L — ABNORMAL HIGH (ref 3.5–5.1)
Sodium: 138 mmol/L (ref 135–145)

## 2019-03-31 LAB — GLUCOSE, CAPILLARY
Glucose-Capillary: 100 mg/dL — ABNORMAL HIGH (ref 70–99)
Glucose-Capillary: 233 mg/dL — ABNORMAL HIGH (ref 70–99)
Glucose-Capillary: 156 mg/dL — ABNORMAL HIGH (ref 70–99)
Glucose-Capillary: 134 mg/dL — ABNORMAL HIGH (ref 70–99)
Glucose-Capillary: 70 mg/dL (ref 70–99)

## 2019-03-31 MED ORDER — INSULIN ASPART 100 UNIT/ML ~~LOC~~ SOLN: 0.0000 [IU] | Freq: Every day | SUBCUTANEOUS | Status: DC

## 2019-03-31 MED ORDER — FUROSEMIDE 10 MG/ML IJ SOLN
40.0000 mg | Freq: Once | INTRAMUSCULAR | Status: AC
  Administered 2019-03-31: 08:00:00 40 mg via INTRAVENOUS
  Filled 2019-03-31: qty 4

## 2019-03-31 MED ORDER — INSULIN ASPART 100 UNIT/ML ~~LOC~~ SOLN
0.0000 [IU] | Freq: Three times a day (TID) | SUBCUTANEOUS | Status: DC
  Administered 2019-03-31: 12:00:00 2 [IU] via SUBCUTANEOUS
  Administered 2019-03-31: 17:00:00 1 [IU] via SUBCUTANEOUS
  Administered 2019-04-01: 12:00:00 2 [IU] via SUBCUTANEOUS
  Administered 2019-04-02: 1 [IU] via SUBCUTANEOUS
  Administered 2019-04-02: 12:00:00 2 [IU] via SUBCUTANEOUS
  Administered 2019-04-03: 07:00:00 1 [IU] via SUBCUTANEOUS
  Administered 2019-04-03: 2 [IU] via SUBCUTANEOUS

## 2019-03-31 MED ORDER — LINAGLIPTIN 5 MG PO TABS
5.0000 mg | ORAL_TABLET | Freq: Every day | ORAL | Status: DC
  Administered 2019-03-31 – 2019-04-04 (×5): 5 mg via ORAL
  Filled 2019-03-31 (×5): qty 1

## 2019-03-31 MED ORDER — GLIPIZIDE ER 2.5 MG PO TB24
2.5000 mg | ORAL_TABLET | Freq: Two times a day (BID) | ORAL | Status: DC
  Administered 2019-03-31: 2.5 mg via ORAL
  Filled 2019-03-31 (×3): qty 1

## 2019-03-31 MED ORDER — AMLODIPINE BESYLATE 5 MG PO TABS
5.0000 mg | ORAL_TABLET | Freq: Every day | ORAL | Status: DC
  Administered 2019-03-31 – 2019-04-04 (×5): 5 mg via ORAL
  Filled 2019-03-31 (×5): qty 1

## 2019-03-31 MED FILL — Mannitol IV Soln 20%: INTRAVENOUS | Qty: 500 | Status: AC

## 2019-03-31 MED FILL — Sodium Chloride IV Soln 0.9%: INTRAVENOUS | Qty: 2000 | Status: AC

## 2019-03-31 MED FILL — Lidocaine HCl(Cardiac) IV PF Soln Pref Syr 100 MG/5ML (2%): INTRAVENOUS | Qty: 5 | Status: AC

## 2019-03-31 MED FILL — Potassium Chloride Inj 2 mEq/ML: INTRAVENOUS | Qty: 40 | Status: AC

## 2019-03-31 MED FILL — Electrolyte-R (PH 7.4) Solution: INTRAVENOUS | Qty: 3000 | Status: AC

## 2019-03-31 MED FILL — Magnesium Sulfate Inj 50%: INTRAMUSCULAR | Qty: 10 | Status: AC

## 2019-03-31 MED FILL — Heparin Sodium (Porcine) Inj 1000 Unit/ML: INTRAMUSCULAR | Qty: 30 | Status: AC

## 2019-03-31 MED FILL — Sodium Bicarbonate IV Soln 8.4%: INTRAVENOUS | Qty: 50 | Status: AC

## 2019-03-31 MED FILL — Dexmedetomidine HCl in NaCl 0.9% IV Soln 400 MCG/100ML: INTRAVENOUS | Qty: 100 | Status: AC

## 2019-03-31 NOTE — Progress Notes (Signed)
2 Days Post-Op Procedure(s) (LRB): CORONARY ARTERY BYPASS GRAFTING (CABG) x 4, using left internal mammary artery and right leg greater saphenous vein harvested endoscopically (N/A) TRANSESOPHAGEAL ECHOCARDIOGRAM (TEE) (N/A) Subjective: Feels better today  Objective: Vital signs in last 24 hours: Temp:  [97.7 F (36.5 C)-98.5 F (36.9 C)] 97.7 F (36.5 C) (05/08 0747) Pulse Rate:  [80-104] 89 (05/08 0800) Cardiac Rhythm: Normal sinus rhythm (05/08 0400) Resp:  [11-24] 15 (05/08 0800) BP: (106-166)/(57-85) 145/68 (05/08 0800) SpO2:  [91 %-97 %] 95 % (05/08 0800) Arterial Line BP: (121-176)/(46-57) 121/57 (05/07 1200)  Hemodynamic parameters for last 24 hours:    Intake/Output from previous day: 05/07 0701 - 05/08 0700 In: 888.1 [P.O.:230; I.V.:658.1] Out: 2305 [Urine:2245; Chest Tube:60] Intake/Output this shift: No intake/output data recorded.  General appearance: alert, cooperative and no distress Neurologic: intact Heart: regular rate and rhythm Lungs: diminished breath sounds bibasilar Abdomen: normal findings: soft, non-tender  Lab Results: Recent Labs    03/30/19 1622 03/30/19 1623 03/31/19 0348  WBC 12.6*  --  10.6*  HGB 9.7* 9.9* 9.3*  HCT 31.2* 29.0* 29.8*  PLT 106*  --  87*   BMET:  Recent Labs    03/30/19 1622 03/30/19 1623 03/31/19 0348  NA 137 138 138  K 5.3* 5.2* 4.7  CL 107  --  107  CO2 20*  --  21*  GLUCOSE 152* 146* 125*  BUN 59*  --  60*  CREATININE 5.46*  --  5.49*  CALCIUM 9.4  --  9.6    PT/INR:  Recent Labs    03/29/19 1344  LABPROT 16.5*  INR 1.4*   ABG    Component Value Date/Time   PHART 7.298 (L) 03/29/2019 2228   HCO3 22.7 03/29/2019 2228   TCO2 24 03/29/2019 2228   ACIDBASEDEF 4.0 (H) 03/29/2019 2228   O2SAT 98.0 03/29/2019 2228   CBG (last 3)  Recent Labs    03/30/19 2348 03/31/19 0338 03/31/19 0745  GLUCAP 139* 100* 233*    Assessment/Plan: S/P Procedure(s) (LRB): CORONARY ARTERY BYPASS GRAFTING  (CABG) x 4, using left internal mammary artery and right leg greater saphenous vein harvested endoscopically (N/A) TRANSESOPHAGEAL ECHOCARDIOGRAM (TEE) (N/A) -POD # 2 CV- in SR, will wean dopamine today (was on for renal perfusion)  Resume norvasc RESP- IS for basilar atelectasis RENAL- creatinine stable at 5.49(5.46 yesterday, 4.5 on admission), good UO with lasix  Wean dopamine  Lasix 40 mg IV this AM, keep Foley today  K= 4.7 ENDO- CBG elevated, will restart PO meds, continue levemir + SSI Thrombocytopenia- PLT down from 106 to 87K- stop lovenox  Check HIT Ab  SCD + ambulation for DVT prophylaxis Cardiac rehab   LOS: 7 days    Melrose Nakayama 03/31/2019

## 2019-03-31 NOTE — Progress Notes (Signed)
[facility]    NEPHROLOGY PROGRESS NOTE  SUBJECTIVE: Patient seen and examined this morning.  Resting comfortably.  No complaints of chest pain, shortness of breath,vomiting, or other symptoms.  Had some nausea yesterday.  All other review of systems were negative    OBJECTIVE:  Vitals:   03/31/19 1300 03/31/19 1330  BP: (!) 113/59 (!) 107/59  Pulse: 87 84  Resp: 17 16  Temp:    SpO2: 95% 93%    Intake/Output Summary (Last 24 hours) at 03/31/2019 1349 Last data filed at 03/31/2019 1300 Gross per 24 hour  Intake 747.35 ml  Output 2185 ml  Net -1437.65 ml      General:  AAOx3 NAD HEENT: MMM [place] AT anicteric sclera Neck:  No JVD, no adenopathy CV:  Heart RRR  Lungs:  L/S CTA bilaterally Abd:  abd SNT/ND with normal BS GU:  Bladder non-palpable Extremities:  No LE edema. Skin:  No skin rash  MEDICATIONS:  . acetaminophen  1,000 mg Oral Q6H   Or  . acetaminophen (TYLENOL) oral liquid 160 mg/5 mL  1,000 mg Per Tube Q6H  . amLODipine  5 mg Oral Daily  . aspirin EC  325 mg Oral Daily   Or  . aspirin  324 mg Per Tube Daily  . atorvastatin  40 mg Oral q1800  . bisacodyl  10 mg Oral Daily   Or  . bisacodyl  10 mg Rectal Daily  . calcitRIOL  0.5 mcg Oral Daily  . Chlorhexidine Gluconate Cloth  6 each Topical Daily  . docusate sodium  200 mg Oral Daily  . gabapentin  200 mg Oral QHS  . glipiZIDE  2.5 mg Oral BID WC  . insulin aspart  0-5 Units Subcutaneous QHS  . insulin aspart  0-9 Units Subcutaneous TID WC  . insulin detemir  20 Units Subcutaneous BID  . linagliptin  5 mg Oral Daily  . metoCLOPramide (REGLAN) injection  10 mg Intravenous Q6H  . metoprolol tartrate  12.5 mg Oral BID   Or  . metoprolol tartrate  12.5 mg Per Tube BID  . mupirocin ointment   Nasal BID  . pantoprazole  40 mg Oral Daily  . patiromer  8.4 g Oral Daily  . sodium chloride flush  3 mL Intravenous Q12H  . tamsulosin  0.4 mg Oral QAC supper       LABS:   CBC Latest Ref  Rng & Units 03/31/2019 03/30/2019 03/30/2019  WBC 4.0 - 10.5 K/uL 10.6(H) - 12.6(H)  Hemoglobin 13.0 - 17.0 g/dL 9.3(L) 9.9(L) 9.7(L)  Hematocrit 39.0 - 52.0 % 29.8(L) 29.0(L) 31.2(L)  Platelets 150 - 400 K/uL 87(L) - 106(L)    CMP Latest Ref Rng & Units 03/31/2019 03/30/2019 03/30/2019  Glucose 70 - 99 mg/dL 125(H) 146(H) 152(H)  BUN 8 - 23 mg/dL 60(H) - 59(H)  Creatinine 0.61 - 1.24 mg/dL 5.49(H) - 5.46(H)  Sodium 135 - 145 mmol/L 138 138 137  Potassium 3.5 - 5.1 mmol/L 4.7 5.2(H) 5.3(H)  Chloride 98 - 111 mmol/L 107 - 107  CO2 22 - 32 mmol/L 21(L) - 20(L)  Calcium 8.9 - 10.3 mg/dL 9.6 - 9.4  Total Protein 6.5 - 8.1 g/dL - - -  Total Bilirubin 0.3 - 1.2 mg/dL - - -  Alkaline Phos 38 - 126 U/L - - -  AST 15 - 41 U/L - - -  ALT 0 - 44 U/L - - -    Lab Results  Component Value Date  CALCIUM 9.6 03/31/2019   CAION 1.34 03/29/2019       Component Value Date/Time   COLORURINE YELLOW 03/28/2019 1104   APPEARANCEUR CLEAR 03/28/2019 1104   LABSPEC 1.010 03/28/2019 1104   PHURINE 7.0 03/28/2019 1104   GLUCOSEU >=500 (A) 03/28/2019 1104   HGBUR NEGATIVE 03/28/2019 1104   [place] 03/28/2019 1104   KETONESUR NEGATIVE 03/28/2019 1104   PROTEINUR 100 (A) 03/28/2019 1104   NITRITE NEGATIVE 03/28/2019 1104   LEUKOCYTESUR NEGATIVE 03/28/2019 1104      Component Value Date/Time   PHART 7.298 (L) 03/29/2019 2228   PCO2ART 46.5 03/29/2019 2228   PO2ART 114.0 (H) 03/29/2019 2228   HCO3 22.7 03/29/2019 2228   TCO2 24 03/29/2019 2228   ACIDBASEDEF 4.0 (H) 03/29/2019 2228   O2SAT 98.0 03/29/2019 2228    No results found for: IRON, TIBC, FERRITIN, IRONPCTSAT     ASSESSMENT/PLAN:    68 year old male patient with chronic kidney disease stage V with a baseline serum creatinine around 4-4.5 who presented with an NSTEMI status post cardiac catheterization with multivessel disease.  He is status post CABG on 03/29/19.  1.  Chronic kidney disease stage V.  His baseline serum creatinine runs  around 4-4.5.  He does have underlying diabetes and hypertension.  His serum creatinine is slowly trending up.  No evidence of uremia.  He does have a functioning AV fistula.  He remains nonoliguric.  50 cc of contrast was used on 03/27/2023 his cardiac catheterization.  No urgent need for dialysis now.  We will continue to follow closely.  Will check intact PTH and phosphorus.  2.  Hyperkalemia.  On Veltassa.  Potassium improved today.  3.  NSTEMI with multivessel disease status post cardiac catheterization on 03/27/2019 with three-vessel disease, now status post CABG on 03/29/2019.  Per cardiology.  4.  Hypertension.  Continue current medications.  5.  Anemia.  Continue to monitor.  Check iron studies for completeness.      [place], DO, [place]

## 2019-03-31 NOTE — Progress Notes (Signed)
Patient ID: John [name], male   DOB: May 30, 1951, 68 y.o.   MRN: [id number] [facility] Evening Rounds:  Hemodynamically stable  sats 100% on RA.  Remains on low dose dopamine for renal failure. Urine output 50/hr today with lasix.

## 2019-04-01 LAB — CBC
HCT: 25.1 % — ABNORMAL LOW (ref 39.0–52.0)
Hemoglobin: 7.9 g/dL — ABNORMAL LOW (ref 13.0–17.0)
MCH: 29.9 pg (ref 26.0–34.0)
MCHC: 31.5 g/dL (ref 30.0–36.0)
MCV: 95.1 fL (ref 80.0–100.0)
Platelets: 91 10*3/uL — ABNORMAL LOW (ref 150–400)
RBC: 2.64 MIL/uL — ABNORMAL LOW (ref 4.22–5.81)
RDW: 13.5 % (ref 11.5–15.5)
WBC: 8.8 10*3/uL (ref 4.0–10.5)
nRBC: 0 % (ref 0.0–0.2)

## 2019-04-01 LAB — BASIC METABOLIC PANEL
Anion gap: 14 (ref 5–15)
BUN: 68 mg/dL — ABNORMAL HIGH (ref 8–23)
CO2: 21 mmol/L — ABNORMAL LOW (ref 22–32)
Calcium: 9.3 mg/dL (ref 8.9–10.3)
Chloride: 101 mmol/L (ref 98–111)
Creatinine, Ser: 5.42 mg/dL — ABNORMAL HIGH (ref 0.61–1.24)
GFR calc Af Amer: 12 mL/min — ABNORMAL LOW (ref >60)
GFR calc non Af Amer: 10 mL/min — ABNORMAL LOW (ref >60)
Glucose, Bld: 84 mg/dL (ref 70–99)
Potassium: 4.2 mmol/L (ref 3.5–5.1)
Sodium: 136 mmol/L (ref 135–145)

## 2019-04-01 LAB — GLUCOSE, CAPILLARY
Glucose-Capillary: 108 mg/dL — ABNORMAL HIGH (ref 70–99)
Glucose-Capillary: 76 mg/dL (ref 70–99)
Glucose-Capillary: 156 mg/dL — ABNORMAL HIGH (ref 70–99)
Glucose-Capillary: 102 mg/dL — ABNORMAL HIGH (ref 70–99)

## 2019-04-01 LAB — IRON AND TIBC
Iron: 11 ug/dL — ABNORMAL LOW (ref 45–182)
Saturation Ratios: 5 % — ABNORMAL LOW (ref 17.9–39.5)
TIBC: 214 ug/dL — ABNORMAL LOW (ref 250–450)
UIBC: 203 ug/dL

## 2019-04-01 LAB — PHOSPHORUS: Phosphorus: 5.7 mg/dL — ABNORMAL HIGH (ref 2.5–4.6)

## 2019-04-01 LAB — FERRITIN: Ferritin: 97 ng/mL (ref 24–336)

## 2019-04-01 MED ORDER — SODIUM CHLORIDE 0.9 % IV SOLN
125.0000 mg | Freq: Once | INTRAVENOUS | Status: AC
  Administered 2019-04-01: 12:00:00 125 mg via INTRAVENOUS
  Filled 2019-04-01: qty 10

## 2019-04-01 MED ORDER — TRAMADOL HCL 50 MG PO TABS
50.0000 mg | ORAL_TABLET | Freq: Two times a day (BID) | ORAL | Status: DC | PRN
  Administered 2019-04-04: 50 mg via ORAL
  Filled 2019-04-01: qty 1

## 2019-04-01 MED ORDER — SEVELAMER CARBONATE 800 MG PO TABS
800.0000 mg | ORAL_TABLET | Freq: Three times a day (TID) | ORAL | Status: DC
  Administered 2019-04-01 – 2019-04-04 (×9): 800 mg via ORAL
  Filled 2019-04-01 (×9): qty 1

## 2019-04-01 MED ORDER — INSULIN DETEMIR 100 UNIT/ML ~~LOC~~ SOLN
20.0000 [IU] | Freq: Every day | SUBCUTANEOUS | Status: DC
  Administered 2019-04-01 – 2019-04-02 (×2): 20 [IU] via SUBCUTANEOUS
  Filled 2019-04-01 (×3): qty 0.2

## 2019-04-01 MED ORDER — SODIUM CHLORIDE 0.9 % IV SOLN
INTRAVENOUS | Status: DC | PRN
  Administered 2019-04-01 – 2019-04-02 (×2): 250 mL via INTRAVENOUS

## 2019-04-01 NOTE — Progress Notes (Signed)
[facility]    NEPHROLOGY PROGRESS NOTE  SUBJECTIVE: Patient seen and examined this morning.  Resting comfortably.  No complaints of chest pain, shortness of breath,vomiting, or other symptoms.  In good spirits today.  All other review of systems were negative    OBJECTIVE:  Vitals:   04/01/19 0900 04/01/19 1007  BP:  122/70  Pulse: 81 82  Resp: 16   Temp:    SpO2: 96%     Intake/Output Summary (Last 24 hours) at 04/01/2019 1054 Last data filed at 04/01/2019 0900 Gross per 24 hour  Intake 1335.97 ml  Output 1650 ml  Net -314.03 ml      General:  AAOx3 NAD HEENT: MMM [place] AT anicteric sclera Neck:  No JVD, no adenopathy CV:  Heart RRR  Lungs:  L/S CTA bilaterally Abd:  abd SNT/ND with normal BS GU:  Bladder non-palpable Extremities:  No LE edema. Skin:  No skin rash  MEDICATIONS:  . acetaminophen  1,000 mg Oral Q6H   Or  . acetaminophen (TYLENOL) oral liquid 160 mg/5 mL  1,000 mg Per Tube Q6H  . amLODipine  5 mg Oral Daily  . aspirin EC  325 mg Oral Daily   Or  . aspirin  324 mg Per Tube Daily  . atorvastatin  40 mg Oral q1800  . bisacodyl  10 mg Oral Daily   Or  . bisacodyl  10 mg Rectal Daily  . calcitRIOL  0.5 mcg Oral Daily  . Chlorhexidine Gluconate Cloth  6 each Topical Daily  . docusate sodium  200 mg Oral Daily  . gabapentin  200 mg Oral QHS  . insulin aspart  0-5 Units Subcutaneous QHS  . insulin aspart  0-9 Units Subcutaneous TID WC  . insulin detemir  20 Units Subcutaneous Daily  . linagliptin  5 mg Oral Daily  . metoprolol tartrate  12.5 mg Oral BID   Or  . metoprolol tartrate  12.5 mg Per Tube BID  . mupirocin ointment   Nasal BID  . pantoprazole  40 mg Oral Daily  . patiromer  8.4 g Oral Daily  . sodium chloride flush  3 mL Intravenous Q12H  . tamsulosin  0.4 mg Oral QAC supper       LABS:   CBC Latest Ref Rng & Units 04/01/2019 03/31/2019 03/30/2019  WBC 4.0 - 10.5 K/uL 8.8 10.6(H) -  Hemoglobin 13.0 - 17.0 g/dL 7.9(L)  9.3(L) 9.9(L)  Hematocrit 39.0 - 52.0 % 25.1(L) 29.8(L) 29.0(L)  Platelets 150 - 400 K/uL 91(L) 87(L) -    CMP Latest Ref Rng & Units 04/01/2019 03/31/2019 03/30/2019  Glucose 70 - 99 mg/dL 84 125(H) 146(H)  BUN 8 - 23 mg/dL 68(H) 60(H) -  Creatinine 0.61 - 1.24 mg/dL 5.42(H) 5.49(H) -  Sodium 135 - 145 mmol/L 136 138 138  Potassium 3.5 - 5.1 mmol/L 4.2 4.7 5.2(H)  Chloride 98 - 111 mmol/L 101 107 -  CO2 22 - 32 mmol/L 21(L) 21(L) -  Calcium 8.9 - 10.3 mg/dL 9.3 9.6 -  Total Protein 6.5 - 8.1 g/dL - - -  Total Bilirubin 0.3 - 1.2 mg/dL - - -  Alkaline Phos 38 - 126 U/L - - -  AST 15 - 41 U/L - - -  ALT 0 - 44 U/L - - -    Lab Results  Component Value Date   CALCIUM 9.3 04/01/2019   CAION 1.34 03/29/2019   PHOS 5.7 (H) 04/01/2019  Component Value Date/Time   COLORURINE YELLOW 03/28/2019 1104   APPEARANCEUR CLEAR 03/28/2019 1104   LABSPEC 1.010 03/28/2019 1104   PHURINE 7.0 03/28/2019 1104   GLUCOSEU >=500 (A) 03/28/2019 1104   HGBUR NEGATIVE 03/28/2019 1104   BILIRUBINUR NEGATIVE 03/28/2019 1104   KETONESUR NEGATIVE 03/28/2019 1104   PROTEINUR 100 (A) 03/28/2019 1104   NITRITE NEGATIVE 03/28/2019 1104   LEUKOCYTESUR NEGATIVE 03/28/2019 1104      Component Value Date/Time   PHART 7.298 (L) 03/29/2019 2228   PCO2ART 46.5 03/29/2019 2228   PO2ART 114.0 (H) 03/29/2019 2228   HCO3 22.7 03/29/2019 2228   TCO2 24 03/29/2019 2228   ACIDBASEDEF 4.0 (H) 03/29/2019 2228   O2SAT 98.0 03/29/2019 2228       Component Value Date/Time   IRON 11 (L) 04/01/2019 0501   TIBC 214 (L) 04/01/2019 0501   FERRITIN 97 04/01/2019 0501   IRONPCTSAT 5 (L) 04/01/2019 0501       ASSESSMENT/PLAN:    68 year old male patient with chronic kidney disease stage V with a baseline serum creatinine around 4-4.5 who presented with an NSTEMI status post cardiac catheterization with multivessel disease.  He is status post CABG on 03/29/19.  1.  Chronic kidney disease stage V.  His baseline serum  creatinine runs around 4-4.5.  He does have underlying diabetes and hypertension.  His serum creatinine is slowly trending up.  No evidence of uremia.  He does have a functioning RUE AV fistula.  He remains nonoliguric.  50 cc of contrast was used on 03/27/2023 his cardiac catheterization.  No urgent need for dialysis now.  We will continue to follow closely.  PTH is pending.  Creatinine is downtrending.  2.  Hyperkalemia.  On Velt[place].  Potassium improved today.  3.  NSTEMI with multivessel disease status post cardiac catheterization on 03/27/2019 with three-vessel disease, now status post CABG on 03/29/2019.  Per cardiology.  4.  Hypertension.  Continue current medications.  5.  Anemia.  Continue to monitor.  Iron depletion noted.  We will add IV iron.  6.  Hyperphosphatemia.  We will add sevelamer.      [place], DO, [place]

## 2019-04-01 NOTE — Progress Notes (Signed)
3 Days Post-Op Procedure(s) (LRB): CORONARY ARTERY BYPASS GRAFTING (CABG) x 4, using left internal mammary artery and right leg greater saphenous vein harvested endoscopically (N/A) TRANSESOPHAGEAL ECHOCARDIOGRAM (TEE) (N/A) Subjective: No complaints. No BM yet  Objective: Vital signs in last 24 hours: Temp:  [98.2 F (36.8 C)-98.6 F (37 C)] 98.2 F (36.8 C) (05/09 0418) Pulse Rate:  [76-99] 95 (05/09 0800) Cardiac Rhythm: Normal sinus rhythm (05/09 0400) Resp:  [13-26] 23 (05/09 0800) BP: (90-134)/(54-73) 125/65 (05/09 0800) SpO2:  [90 %-100 %] 90 % (05/09 0800) Weight:  [99 kg] 99 kg (05/09 0500)  Hemodynamic parameters for last 24 hours:    Intake/Output from previous day: 05/08 0701 - 05/09 0700 In: 1347.3 [P.O.:870; I.V.:477.3] Out: 1800 [Urine:1800] Intake/Output this shift: No intake/output data recorded.  General appearance: alert and cooperative Neurologic: intact Heart: regular rate and rhythm, S1, S2 normal, no murmur, click, rub or gallop Lungs: clear to auscultation bilaterally Extremities: extremities normal, atraumatic, no cyanosis or edema Wound: incision ok  Lab Results: Recent Labs    03/31/19 0348 04/01/19 0501  WBC 10.6* 8.8  HGB 9.3* 7.9*  HCT 29.8* 25.1*  PLT 87* 91*   BMET:  Recent Labs    03/31/19 0348 04/01/19 0501  NA 138 136  K 4.7 4.2  CL 107 101  CO2 21* 21*  GLUCOSE 125* 84  BUN 60* 68*  CREATININE 5.49* 5.42*  CALCIUM 9.6 9.3    PT/INR:  Recent Labs    03/29/19 1344  LABPROT 16.5*  INR 1.4*   ABG    Component Value Date/Time   PHART 7.298 (L) 03/29/2019 2228   HCO3 22.7 03/29/2019 2228   TCO2 24 03/29/2019 2228   ACIDBASEDEF 4.0 (H) 03/29/2019 2228   O2SAT 98.0 03/29/2019 2228   CBG (last 3)  Recent Labs    03/31/19 2214 03/31/19 2335 04/01/19 0751  GLUCAP 70 108* 76    Assessment/Plan: S/P Procedure(s) (LRB): CORONARY ARTERY BYPASS GRAFTING (CABG) x 4, using left internal mammary artery and right  leg greater saphenous vein harvested endoscopically (N/A) TRANSESOPHAGEAL ECHOCARDIOGRAM (TEE) (N/A)  POD 3 Hemodynamically stable in sinus rhythm. Continue Lopressor.  Stage IV CKD: creat slightly better today with continued adequate urine output with lasix. Nephrology following. Dopamine is off.  DM: glucose under adequate control.   DC foley and sleeve.  IS, ambulation.   LOS: 8 days    John [name] 04/01/2019

## 2019-04-01 NOTE — Progress Notes (Signed)
Patient ID: John [name], male   DOB: [birth year], 68 y.o.   MRN: [id number] TCTS Evening Rounds:  Hemodynamically stable in sinus rhythm.  Urine output ok off dopamine.

## 2019-04-01 NOTE — Progress Notes (Signed)
Sarah RN aware of order to d/c cvc

## 2019-04-02 ENCOUNTER — Encounter (HOSPITAL_COMMUNITY): Payer: Self-pay

## 2019-04-02 LAB — CBC
HCT: 24.7 % — ABNORMAL LOW (ref 39.0–52.0)
Hemoglobin: 8 g/dL — ABNORMAL LOW (ref 13.0–17.0)
MCH: 30.1 pg (ref 26.0–34.0)
MCHC: 32.4 g/dL (ref 30.0–36.0)
MCV: 92.9 fL (ref 80.0–100.0)
Platelets: 105 10*3/uL — ABNORMAL LOW (ref 150–400)
RBC: 2.66 MIL/uL — ABNORMAL LOW (ref 4.22–5.81)
RDW: 13.4 % (ref 11.5–15.5)
WBC: 7.7 10*3/uL (ref 4.0–10.5)
nRBC: 0 % (ref 0.0–0.2)

## 2019-04-02 LAB — BASIC METABOLIC PANEL
Anion gap: 12 (ref 5–15)
BUN: 69 mg/dL — ABNORMAL HIGH (ref 8–23)
CO2: 19 mmol/L — ABNORMAL LOW (ref 22–32)
Calcium: 9.3 mg/dL (ref 8.9–10.3)
Chloride: 105 mmol/L (ref 98–111)
Creatinine, Ser: 5.51 mg/dL — ABNORMAL HIGH (ref 0.61–1.24)
GFR calc Af Amer: 12 mL/min — ABNORMAL LOW (ref >60)
GFR calc non Af Amer: 10 mL/min — ABNORMAL LOW (ref >60)
Glucose, Bld: 119 mg/dL — ABNORMAL HIGH (ref 70–99)
Potassium: 4.3 mmol/L (ref 3.5–5.1)
Sodium: 136 mmol/L (ref 135–145)

## 2019-04-02 LAB — GLUCOSE, CAPILLARY
Glucose-Capillary: 163 mg/dL — ABNORMAL HIGH (ref 70–99)
Glucose-Capillary: 123 mg/dL — ABNORMAL HIGH (ref 70–99)
Glucose-Capillary: 157 mg/dL — ABNORMAL HIGH (ref 70–99)
Glucose-Capillary: 119 mg/dL — ABNORMAL HIGH (ref 70–99)
Glucose-Capillary: 184 mg/dL — ABNORMAL HIGH (ref 70–99)

## 2019-04-02 LAB — HEPARIN INDUCED PLATELET AB (HIT ANTIBODY): Heparin Induced Plt Ab: 0.054 OD (ref 0.000–0.400)

## 2019-04-02 LAB — PARATHYROID HORMONE, INTACT (NO CA): PTH: 260 pg/mL — ABNORMAL HIGH (ref 15–65)

## 2019-04-02 MED ORDER — SODIUM CHLORIDE 0.9 % IV SOLN
125.0000 mg | Freq: Every day | INTRAVENOUS | Status: AC
  Administered 2019-04-02 – 2019-04-03 (×2): 125 mg via INTRAVENOUS
  Filled 2019-04-02 (×2): qty 10

## 2019-04-02 NOTE — Progress Notes (Signed)
4 Days Post-Op Procedure(s) (LRB): CORONARY ARTERY BYPASS GRAFTING (CABG) x 4, using left internal mammary artery and right leg greater saphenous vein harvested endoscopically (N/A) TRANSESOPHAGEAL ECHOCARDIOGRAM (TEE) (N/A) Subjective: No complaints.  Ambulated 6 laps yesterday.   Objective: Vital signs in last 24 hours: Temp:  [97.8 F (36.6 C)-98.4 F (36.9 C)] 97.8 F (36.6 C) (05/10 0400) Pulse Rate:  [73-113] 82 (05/10 0700) Cardiac Rhythm: Normal sinus rhythm (05/10 0400) Resp:  [15-26] 15 (05/10 0700) BP: (88-147)/(48-72) 124/62 (05/10 0700) SpO2:  [85 %-97 %] 95 % (05/10 0700) Weight:  [99.4 kg] 99.4 kg (05/10 0500)  Hemodynamic parameters for last 24 hours:    Intake/Output from previous day: 05/09 0701 - 05/10 0700 In: 213.7 [I.V.:103.7; IV Piggyback:110] Out: 2550 [Urine:2550] Intake/Output this shift: No intake/output data recorded.  General appearance: alert and cooperative Neurologic: intact Heart: regular rate and rhythm, S1, S2 normal, no murmur, click, rub or gallop Lungs: clear to auscultation bilaterally Extremities: extremities normal, atraumatic, no cyanosis or edema Wound: incisions ok  Lab Results: Recent Labs    04/01/19 0501 04/02/19 0423  WBC 8.8 7.7  HGB 7.9* 8.0*  HCT 25.1* 24.7*  PLT 91* 105*   BMET:  Recent Labs    04/01/19 0501 04/02/19 0423  NA 136 136  K 4.2 4.3  CL 101 105  CO2 21* 19*  GLUCOSE 84 119*  BUN 68* 69*  CREATININE 5.42* 5.51*  CALCIUM 9.3 9.3    PT/INR: No results for input(s): LABPROT, INR in the last 72 hours. ABG    Component Value Date/Time   PHART 7.298 (L) 03/29/2019 2228   HCO3 22.7 03/29/2019 2228   TCO2 24 03/29/2019 2228   ACIDBASEDEF 4.0 (H) 03/29/2019 2228   O2SAT 98.0 03/29/2019 2228   CBG (last 3)  Recent Labs    04/01/19 1620 04/01/19 2116 04/02/19 0648  GLUCAP 102* 163* 123*    Assessment/Plan: S/P Procedure(s) (LRB): CORONARY ARTERY BYPASS GRAFTING (CABG) x 4, using left  internal mammary artery and right leg greater saphenous vein harvested endoscopically (N/A) TRANSESOPHAGEAL ECHOCARDIOGRAM (TEE) (N/A)  He remains hemodynamically stable in sinus rhythm.   Stage V CKD: creat up slightly today but has been fairly stable. He was -2300 cc yesterday and continues to make good urine without diuretic since Friday.  DM: glucose under good control.  Anemia: stable. On iron.  Continue IS, ambulation   LOS: 9 days    John [name] 04/02/2019

## 2019-04-02 NOTE — Progress Notes (Signed)
[facility]    NEPHROLOGY PROGRESS NOTE  SUBJECTIVE: Patient seen and examined this morning.  Resting comfortably.  No complaints of chest pain, shortness of breath,vomiting, or other symptoms.  In good spirits today.  Walking around the room.  All other review of systems were negative    OBJECTIVE:  Vitals:   04/02/19 0800 04/02/19 0915  BP: (!) 143/64 135/60  Pulse: 87   Resp: 18   Temp:    SpO2: 95%     Intake/Output Summary (Last 24 hours) at 04/02/2019 1152 Last data filed at 04/02/2019 0800 Gross per 24 hour  Intake 365.62 ml  Output 2175 ml  Net -1809.38 ml      General:  AAOx3 NAD HEENT: MMM [place] AT anicteric sclera Neck:  No JVD, no adenopathy CV:  Heart RRR, positive midline chest incision Lungs:  L/S CTA bilaterally Abd:  abd SNT/ND with normal BS GU:  Bladder non-palpable Extremities:  No LE edema. Skin:  No skin rash  MEDICATIONS:  . acetaminophen  1,000 mg Oral Q6H   Or  . acetaminophen (TYLENOL) oral liquid 160 mg/5 mL  1,000 mg Per Tube Q6H  . amLODipine  5 mg Oral Daily  . aspirin EC  325 mg Oral Daily   Or  . aspirin  324 mg Per Tube Daily  . atorvastatin  40 mg Oral q1800  . bisacodyl  10 mg Oral Daily   Or  . bisacodyl  10 mg Rectal Daily  . calcitRIOL  0.5 mcg Oral Daily  . Chlorhexidine Gluconate Cloth  6 each Topical Daily  . docusate sodium  200 mg Oral Daily  . gabapentin  200 mg Oral QHS  . insulin aspart  0-5 Units Subcutaneous QHS  . insulin aspart  0-9 Units Subcutaneous TID WC  . insulin detemir  20 Units Subcutaneous Daily  . linagliptin  5 mg Oral Daily  . metoprolol tartrate  12.5 mg Oral BID   Or  . metoprolol tartrate  12.5 mg Per Tube BID  . mupirocin ointment   Nasal BID  . pantoprazole  40 mg Oral Daily  . patiromer  8.4 g Oral Daily  . sevelamer carbonate  800 mg Oral TID WC  . sodium chloride flush  3 mL Intravenous Q12H  . tamsulosin  0.4 mg Oral QAC supper       LABS:   CBC Latest Ref Rng &  Units 04/02/2019 04/01/2019 03/31/2019  WBC 4.0 - 10.5 K/uL 7.7 8.8 10.6(H)  Hemoglobin 13.0 - 17.0 g/dL 8.0(L) 7.9(L) 9.3(L)  Hematocrit 39.0 - 52.0 % 24.7(L) 25.1(L) 29.8(L)  Platelets 150 - 400 K/uL 105(L) 91(L) 87(L)    CMP Latest Ref Rng & Units 04/02/2019 04/01/2019 03/31/2019  Glucose 70 - 99 mg/dL 119(H) 84 125(H)  BUN 8 - 23 mg/dL 69(H) 68(H) 60(H)  Creatinine 0.61 - 1.24 mg/dL 5.51(H) 5.42(H) 5.49(H)  Sodium 135 - 145 mmol/L 136 136 138  Potassium 3.5 - 5.1 mmol/L 4.3 4.2 4.7  Chloride 98 - 111 mmol/L 105 101 107  CO2 22 - 32 mmol/L 19(L) 21(L) 21(L)  Calcium 8.9 - 10.3 mg/dL 9.3 9.3 9.6  Total Protein 6.5 - 8.1 g/dL - - -  Total Bilirubin 0.3 - 1.2 mg/dL - - -  Alkaline Phos 38 - 126 U/L - - -  AST 15 - 41 U/L - - -  ALT 0 - 44 U/L - - -    Lab Results  Component Value Date   CALCIUM  9.3 04/02/2019   CAION 1.34 03/29/2019   PHOS 5.7 (H) 04/01/2019       Component Value Date/Time   COLORURINE YELLOW 03/28/2019 1104   APPEARANCEUR CLEAR 03/28/2019 1104   LABSPEC 1.010 03/28/2019 1104   PHURINE 7.0 03/28/2019 1104   GLUCOSEU >=500 (A) 03/28/2019 1104   HGBUR NEGATIVE 03/28/2019 1104   BILIRUBINUR NEGATIVE 03/28/2019 1104   KETONESUR NEGATIVE 03/28/2019 1104   PROTEINUR 100 (A) 03/28/2019 1104   NITRITE NEGATIVE 03/28/2019 1104   LEUKOCYTESUR NEGATIVE 03/28/2019 1104      Component Value Date/Time   PHART 7.298 (L) 03/29/2019 2228   PCO2ART 46.5 03/29/2019 2228   PO2ART 114.0 (H) 03/29/2019 2228   HCO3 22.7 03/29/2019 2228   TCO2 24 03/29/2019 2228   ACIDBASEDEF 4.0 (H) 03/29/2019 2228   O2SAT 98.0 03/29/2019 2228       Component Value Date/Time   IRON 11 (L) 04/01/2019 0501   TIBC 214 (L) 04/01/2019 0501   FERRITIN 97 04/01/2019 0501   IRONPCTSAT 5 (L) 04/01/2019 0501       ASSESSMENT/PLAN:    68 year old male patient with chronic kidney disease stage V with a baseline serum creatinine around 4-4.5 who presented with an NSTEMI status post cardiac  catheterization with multivessel disease.  He is status post CABG on 03/29/19.  1.  Chronic kidney disease stage V.  His baseline serum creatinine runs around 4-4.5.  He does have underlying diabetes and hypertension.  His serum creatinine is slowly trending up.  No evidence of uremia.  He does have a functioning RUE AV fistula.  He remains nonoliguric.  50 cc of contrast was used on 03/27/2023 his cardiac catheterization.  No urgent need for dialysis now.  We will continue to follow closely.  PTH is pending.  Creatinine is stable.  2.  Hyperkalemia.  On Veltassa.  Potassium improved today.  3.  NSTEMI with multivessel disease status post cardiac catheterization on 03/27/2019 with three-vessel disease, now status post CABG on 03/29/2019.  Per cardiology.  4.  Hypertension.  Continue current medications.  5.  Anemia.  Continue to monitor.  Iron depletion noted.  We will continue IV iron.  6.  Hyperphosphatemia.  sevelamer added 04/01/2019.  Okay for disposition from renal standpoint.  Will follow renal function closely as an outpatient.  No signs of uremia.      [place], DO, [place]

## 2019-04-03 DIAGNOSIS — Z951 Presence of aortocoronary bypass graft: Secondary | ICD-10-CM

## 2019-04-03 HISTORY — DX: Presence of aortocoronary bypass graft: Z95.1

## 2019-04-03 LAB — BASIC METABOLIC PANEL
Anion gap: 11 (ref 5–15)
BUN: 65 mg/dL — ABNORMAL HIGH (ref 8–23)
CO2: 22 mmol/L (ref 22–32)
Calcium: 9.5 mg/dL (ref 8.9–10.3)
Chloride: 103 mmol/L (ref 98–111)
Creatinine, Ser: 5.33 mg/dL — ABNORMAL HIGH (ref 0.61–1.24)
GFR calc Af Amer: 12 mL/min — ABNORMAL LOW (ref >60)
GFR calc non Af Amer: 10 mL/min — ABNORMAL LOW (ref >60)
Glucose, Bld: 139 mg/dL — ABNORMAL HIGH (ref 70–99)
Potassium: 4.7 mmol/L (ref 3.5–5.1)
Sodium: 136 mmol/L (ref 135–145)

## 2019-04-03 LAB — CBC
HCT: 24.9 % — ABNORMAL LOW (ref 39.0–52.0)
Hemoglobin: 8.1 g/dL — ABNORMAL LOW (ref 13.0–17.0)
MCH: 30.1 pg (ref 26.0–34.0)
MCHC: 32.5 g/dL (ref 30.0–36.0)
MCV: 92.6 fL (ref 80.0–100.0)
Platelets: 126 10*3/uL — ABNORMAL LOW (ref 150–400)
RBC: 2.69 MIL/uL — ABNORMAL LOW (ref 4.22–5.81)
RDW: 13.3 % (ref 11.5–15.5)
WBC: 7.9 10*3/uL (ref 4.0–10.5)
nRBC: 0 % (ref 0.0–0.2)

## 2019-04-03 LAB — GLUCOSE, CAPILLARY
Glucose-Capillary: 128 mg/dL — ABNORMAL HIGH (ref 70–99)
Glucose-Capillary: 169 mg/dL — ABNORMAL HIGH (ref 70–99)
Glucose-Capillary: 115 mg/dL — ABNORMAL HIGH (ref 70–99)
Glucose-Capillary: 158 mg/dL — ABNORMAL HIGH (ref 70–99)

## 2019-04-03 MED ORDER — ASPIRIN EC 81 MG PO TBEC
81.0000 mg | DELAYED_RELEASE_TABLET | Freq: Every day | ORAL | Status: DC
  Administered 2019-04-03 – 2019-04-04 (×2): 81 mg via ORAL
  Filled 2019-04-03 (×2): qty 1

## 2019-04-03 MED ORDER — SODIUM CHLORIDE 0.9 % IV SOLN: 250.0000 mL | INTRAVENOUS | Status: DC | PRN

## 2019-04-03 MED ORDER — SODIUM CHLORIDE 0.9% FLUSH
3.0000 mL | Freq: Two times a day (BID) | INTRAVENOUS | Status: DC
  Administered 2019-04-03 – 2019-04-04 (×2): 3 mL via INTRAVENOUS

## 2019-04-03 MED ORDER — MOVING RIGHT ALONG BOOK
Freq: Once | Status: DC
  Filled 2019-04-03: qty 1

## 2019-04-03 MED ORDER — SODIUM CHLORIDE 0.9% FLUSH: 3.0000 mL | INTRAVENOUS | Status: DC | PRN

## 2019-04-03 MED ORDER — PATIROMER SORBITEX CALCIUM 8.4 G PO PACK
8.4000 g | PACK | Freq: Every day | ORAL | Status: DC
  Administered 2019-04-03: 8.4 g via ORAL
  Filled 2019-04-03 (×2): qty 1

## 2019-04-03 MED ORDER — ZOLPIDEM TARTRATE 5 MG PO TABS: 5.0000 mg | ORAL_TABLET | Freq: Every evening | ORAL | Status: DC | PRN

## 2019-04-03 NOTE — Progress Notes (Signed)
Pacing wires removed per policy by Hildred Alamin, RN and Yasmin, Therapist, sports.  Tolerated procedure well.

## 2019-04-03 NOTE — Discharge Summary (Signed)
Physician Discharge Summary  Patient ID: John [name] MRN: [id number] DOB/AGE: 1951-02-03 68 y.o.  Admit date: 03/24/2019 Discharge date: 04/04/2019  Admission Diagnoses:  Patient Active Problem List   Diagnosis Date Noted  . Chest pain 03/24/2019  . CKD (chronic kidney disease), stage V ([place]) 03/24/2019  . NSTEMI (non-ST elevated myocardial infarction) ([place]) 03/24/2019  . Chronic kidney disease (CKD), stage IV (severe) ([place]) 12/01/2018  . MSSA bacteremia 09/10/2017  . Low back pain 09/07/2017  . Acute on chronic kidney failure ([place]) 09/07/2017  . DM (diabetes mellitus), type 2 with renal complications ([place]) 06/03/1974  . Essential hypertension 09/07/2017  . HLD (hyperlipidemia) 09/07/2017  . Back pain 09/07/2017  . Fever    Discharge Diagnoses:   Patient Active Problem List   Diagnosis Date Noted  . S/P CABG x 4 04/03/2019  . Chest pain 03/24/2019  . CKD (chronic kidney disease), stage V ([place]) 03/24/2019  . NSTEMI (non-ST elevated myocardial infarction) ([place]) 03/24/2019  . Chronic kidney disease (CKD), stage IV (severe) ([place]) 12/01/2018  . MSSA bacteremia 09/10/2017  . Low back pain 09/07/2017  . Acute on chronic kidney failure ([place]) 09/07/2017  . DM (diabetes mellitus), type 2 with renal complications ([place]) 88/32/5498  . Essential hypertension 09/07/2017  . HLD (hyperlipidemia) 09/07/2017  . Back pain 09/07/2017  . Fever    Discharged Condition: good  History of Present Illness:  John [name] is a 68 yo white male with H/O CAD with prior to PCI to RCA, Type II DM, HTN, PAD S/P Aortobifem bypass, stage IV CKD.  He presented with complaints of chest pressure and left arm numbness.  He admits that over the past several months he has been experiencing mild chest tightness with exertion relieved with rest.  He presented to the ED at which time EKG did not show evidence of ischemia but he did experience a mild elevation in Troponin.  The patient was admitted for further  care.    Hospital Course:   During hospital stay he underwent Echocardiogram which showed preserved EF 50-55% and no valvular pathology.  He underwent catheterization which showed multivessel CAD.  It was felt coronary bypass grafting would be indicated and TCTS consult was obtained.  He was evaluated by Dr. Roxan Hockey who was in agreement coronary bypass grafting would be indicated.  He was in agreement the patient would benefit from coronary bypass grafting.  The risks and benefits of the procedure were explained to the patient and he was agreeable to proceed.  He was taken to the operating room on 03/29/2019.  He underwent CABG x 4 utilizing LIMA to LAD, SVG to Diagonal, SVG to OM1, and SVG to PDA.  He also underwent endosocpic harvest of greater saphenous vein from his right leg.  He tolerated the procedure and was taken to the SICU in stable condition.  He was extubated the evening of surgery.  During his stay in the SICU he was weaned off renal dose dopamine as tolerated.  His urinary output was steady.  Nephrology followed the patient closely throughout his hospital stay.  His creatinine level peaked at 5.51.  His chest tubes and arterial lines were removed without difficulty.  He developed worsening thrombocytopenia once Lovenox was initiated.  This was discontinued and HIT panel was obtained and was negative.  He was diuresed for aggressive volume overload.  His creatinine has remained stable.  Nephrology feels patient can be monitored on an outpatient basis as there is no immediate need for dialysis.  He remains in NSR.  His pacing wires have been removed without difficulty.  He is ambulating independently.  His incisions are healing without evidence of infection.  He is medically stable for discharge home today.        Consults: nephrology  Significant Diagnostic Studies: angiography:    Mid Cx lesion is 90% stenosed.  Ost Cx to Prox Cx lesion is 80% stenosed.  Prox LAD to Mid LAD lesion is  75% stenosed. This lesion is hemodynamically significant and had a markedly positive DFR 0.71.  Ost 1st Diag lesion is 70% stenosed.  Prox RCA to Mid RCA lesion is 75% stenosed.  Dist RCA-1 lesion is 40% stenosed. Prior stent with mild instent restenosis.  Dist RCA-2 lesion is 80% stenosed.  LV end diastolic pressure is normal.  There is no aortic valve stenosis.  Treatments: surgery:   Median sternotomy, extracorporeal circulation coronary artery bypass grafting x4 (left internal mammary artery to left anterior descending, saphenous vein graft to first diagonal, saphenous vein graft to obtuse marginal 1, saphenous vein graft to posterior, endoscopic vein harvest, right leg.  Discharge Exam: Blood pressure 131/76, pulse 86, temperature 98.5 F (36.9 C), temperature source Oral, resp. rate 17, height 6\' 2"  (1.88 m), weight 99.2 kg, SpO2 95 %.  General appearance: alert, cooperative and no distress Neurologic: intact Heart: regular rate and rhythm Lungs: clear to auscultation bilaterally Wound: clean and dry, sternum stable  Disposition: Discharge disposition: 01-Home or Self Care  Discharge Medications:   The patient has been discharged on:   1.Beta Blocker:  Yes [ x  ]                              No   [   ]                              If No, reason:  2.Ace Inhibitor/ARB: Yes [   ]                                     No  [ x   ]                                     If No, reason: Stg IV CKD  3.Statin:   Yes [ x  ]                  No  [   ]                  If No, reason:  4.Ecasa:  Yes  [ x  ]                  No   [   ]                  If No, reason:      Allergies as of 04/04/2019      Reactions   Penicillins Rash, Other (See Comments)   Has patient had a PCN reaction causing immediate rash, facial/tongue/throat swelling, SOB or lightheadedness with hypotension: Yes Has patient had a PCN reaction causing severe rash involving mucus membranes or skin  necrosis: No Has patient  had a PCN reaction that required hospitalization: No Has patient had a PCN reaction occurring within the last 10 years: No If all of the above answers are "NO", then may proceed with Cephalosporin use.      Medication List    TAKE these medications   acetaminophen 500 MG tablet Commonly known as:  TYLENOL Take 1,500 mg by mouth 2 (two) times daily as needed (pain).   amLODipine 5 MG tablet Commonly known as:  NORVASC Take 5 mg by mouth daily.   aspirin EC 81 MG tablet Take 81 mg by mouth daily.   atorvastatin 40 MG tablet Commonly known as:  LIPITOR Take 40 mg by mouth daily.   calcitRIOL 0.5 MCG capsule Commonly known as:  ROCALTROL Take 0.5 mcg by mouth daily.   gabapentin 100 MG capsule Commonly known as:  NEURONTIN Take 2 capsules (200 mg total) by mouth at bedtime.   glipiZIDE 2.5 MG 24 hr tablet Commonly known as:  GLUCOTROL XL Take 2.5 mg by mouth 2 (two) times daily.   linagliptin 5 MG Tabs tablet Commonly known as:  TRADJENTA Take 5 mg by mouth daily.   metoprolol succinate 50 MG 24 hr tablet Commonly known as:  TOPROL-XL Take 50 mg by mouth daily.   OVER THE COUNTER MEDICATION Place 1 drop into both eyes 2 (two) times daily as needed (dry eyes). Hyper Tears eye drops   oxyCODONE 5 MG immediate release tablet Commonly known as:  Oxy IR/ROXICODONE Take 1-2 tablets (5-10 mg total) by mouth every 4 (four) hours as needed for severe pain. What changed:    how much to take  when to take this  reasons to take this   patiromer 8.4 g packet Commonly known as:  VELTASSA Take 1 packet (8.4 g total) by mouth daily at 3 pm.   sevelamer carbonate 800 MG tablet Commonly known as:  RENVELA Take 1 tablet (800 mg total) by mouth 3 (three) times daily with meals.   tamsulosin 0.4 MG Caps capsule Commonly known as:  FLOMAX Take 0.4 mg by mouth daily before supper.   Trulicity 1.5 XH/3.7JI Sopn Generic drug:  Dulaglutide Inject 1.5  mg into the skin every Sunday.   zolpidem 5 MG tablet Commonly known as:  AMBIEN Take 10 mg by mouth at bedtime.      Follow-up Information    Melrose Nakayama, MD Follow up on 05/02/2019.   Specialty:  Cardiothoracic Surgery Why:  Appointment is at 9:00, please get CXR at 8:30 at [facility] located on first floor of our office building Contact information: [place] [address] [phone]        Lendon Colonel, NP Follow up on 04/19/2019.   Specialties:  Nurse Practitioner, Radiology, Cardiology Why:  Appointment is a Televisit at [employer] information: [address] [place] [address] [phone]           Signed: Ellwood Handler, PA-C  04/04/2019, 8:17 AM

## 2019-04-03 NOTE — Progress Notes (Signed)
5 Days Post-Op Procedure(s) (LRB): CORONARY ARTERY BYPASS GRAFTING (CABG) x 4, using left internal mammary artery and right leg greater saphenous vein harvested endoscopically (N/A) TRANSESOPHAGEAL ECHOCARDIOGRAM (TEE) (N/A) Subjective: No complaints except bed uncomfortable Walked 6 laps around unit yesterday  Objective: Vital signs in last 24 hours: Temp:  [98.1 F (36.7 C)-98.3 F (36.8 C)] 98.3 F (36.8 C) (05/10 2005) Pulse Rate:  [81-87] 87 (05/10 1200) Cardiac Rhythm: Normal sinus rhythm (05/10 2350) Resp:  [15-27] 19 (05/11 0400) BP: (120-146)/(60-74) 121/70 (05/11 0400) SpO2:  [94 %-100 %] 94 % (05/11 0400)  Hemodynamic parameters for last 24 hours:    Intake/Output from previous day: 05/10 0701 - 05/11 0700 In: 1420 [P.O.:1420] Out: 2875 [Urine:2875] Intake/Output this shift: No intake/output data recorded.  General appearance: alert, cooperative and no distress Neurologic: intact Heart: regular rate and rhythm Lungs: clear to auscultation bilaterally Wound: clean and dry  Lab Results: Recent Labs    04/02/19 0423 04/03/19 0248  WBC 7.7 7.9  HGB 8.0* 8.1*  HCT 24.7* 24.9*  PLT 105* 126*   BMET:  Recent Labs    04/02/19 0423 04/03/19 0248  NA 136 136  K 4.3 4.7  CL 105 103  CO2 19* 22  GLUCOSE 119* 139*  BUN 69* 65*  CREATININE 5.51* 5.33*  CALCIUM 9.3 9.5    PT/INR: No results for input(s): LABPROT, INR in the last 72 hours. ABG    Component Value Date/Time   PHART 7.298 (L) 03/29/2019 2228   HCO3 22.7 03/29/2019 2228   TCO2 24 03/29/2019 2228   ACIDBASEDEF 4.0 (H) 03/29/2019 2228   O2SAT 98.0 03/29/2019 2228   CBG (last 3)  Recent Labs    04/02/19 1625 04/02/19 2146 04/03/19 0632  GLUCAP 119* 184* 128*    Assessment/Plan: S/P Procedure(s) (LRB): CORONARY ARTERY BYPASS GRAFTING (CABG) x 4, using left internal mammary artery and right leg greater saphenous vein harvested endoscopically (N/A) TRANSESOPHAGEAL ECHOCARDIOGRAM (TEE)  (N/A) - POD # 5  CV- stable in SR, BP well controlled RESP- no issues RENAL- stage V CKD. Continues to have relatively high UOP. Creatinine down slightly today, still above baseline of 4.5, but lytes OK, does not need HD at this time ENDO- CBG mildly elevated, glipizide dc due to renal function, will dc levemir and follow Anemia secondary to ABL- stable Thrombocytopenia resolved,, HIT Ab WNL Dc pacing wires transfer to 4E Probably home in AM    LOS: 10 days    Melrose Nakayama 04/03/2019

## 2019-04-03 NOTE — Progress Notes (Signed)
[facility]    NEPHROLOGY PROGRESS NOTE  SUBJECTIVE: Patient seen and examined this morning.  Resting comfortably.  No complaints of chest pain, shortness of breath,vomiting, or other symptoms.  In good spirits today.  Walking around the room.  All other review of systems were negative    OBJECTIVE:  Vitals:   04/03/19 0945 04/03/19 1000  BP: 140/61 131/73  Pulse: 80 78  Resp: 14 17  Temp:    SpO2: 93% 93%    Intake/Output Summary (Last 24 hours) at 04/03/2019 1122 Last data filed at 04/03/2019 1029 Gross per 24 hour  Intake 1896 ml  Output 2725 ml  Net -829 ml      General:  AAOx3 NAD HEENT: MMM [place] AT anicteric sclera Neck:  No JVD, no adenopathy CV:  Heart RRR, positive midline chest incision Lungs:  L/S CTA bilaterally Abd:  abd SNT/ND with normal BS GU:  Bladder non-palpable Extremities:  No LE edema. Skin:  No skin rash  MEDICATIONS:  . acetaminophen  1,000 mg Oral Q6H   Or  . acetaminophen (TYLENOL) oral liquid 160 mg/5 mL  1,000 mg Per Tube Q6H  . amLODipine  5 mg Oral Daily  . aspirin EC  81 mg Oral Daily  . atorvastatin  40 mg Oral q1800  . bisacodyl  10 mg Oral Daily   Or  . bisacodyl  10 mg Rectal Daily  . calcitRIOL  0.5 mcg Oral Daily  . Chlorhexidine Gluconate Cloth  6 each Topical Daily  . docusate sodium  200 mg Oral Daily  . gabapentin  200 mg Oral QHS  . insulin aspart  0-5 Units Subcutaneous QHS  . insulin aspart  0-9 Units Subcutaneous TID WC  . linagliptin  5 mg Oral Daily  . metoprolol tartrate  12.5 mg Oral BID   Or  . metoprolol tartrate  12.5 mg Per Tube BID  . mupirocin ointment   Nasal BID  . pantoprazole  40 mg Oral Daily  . patiromer  8.4 g Oral Q1500  . sevelamer carbonate  800 mg Oral TID WC  . sodium chloride flush  3 mL Intravenous Q12H  . tamsulosin  0.4 mg Oral QAC supper       LABS:   CBC Latest Ref Rng & Units 04/03/2019 04/02/2019 04/01/2019  WBC 4.0 - 10.5 K/uL 7.9 7.7 8.8  Hemoglobin 13.0 - 17.0  g/dL 8.1(L) 8.0(L) 7.9(L)  Hematocrit 39.0 - 52.0 % 24.9(L) 24.7(L) 25.1(L)  Platelets 150 - 400 K/uL 126(L) 105(L) 91(L)    CMP Latest Ref Rng & Units 04/03/2019 04/02/2019 04/01/2019  Glucose 70 - 99 mg/dL 139(H) 119(H) 84  BUN 8 - 23 mg/dL 65(H) 69(H) 68(H)  Creatinine 0.61 - 1.24 mg/dL 5.33(H) 5.51(H) 5.42(H)  Sodium 135 - 145 mmol/L 136 136 136  Potassium 3.5 - 5.1 mmol/L 4.7 4.3 4.2  Chloride 98 - 111 mmol/L 103 105 101  CO2 22 - 32 mmol/L 22 19(L) 21(L)  Calcium 8.9 - 10.3 mg/dL 9.5 9.3 9.3  Total Protein 6.5 - 8.1 g/dL - - -  Total Bilirubin 0.3 - 1.2 mg/dL - - -  Alkaline Phos 38 - 126 U/L - - -  AST 15 - 41 U/L - - -  ALT 0 - 44 U/L - - -    Lab Results  Component Value Date   PTH 260 (H) 04/01/2019   CALCIUM 9.5 04/03/2019   CAION 1.34 03/29/2019   PHOS 5.7 (H) 04/01/2019  Component Value Date/Time   COLORURINE YELLOW 03/28/2019 1104   APPEARANCEUR CLEAR 03/28/2019 1104   LABSPEC 1.010 03/28/2019 1104   PHURINE 7.0 03/28/2019 1104   GLUCOSEU >=500 (A) 03/28/2019 1104   HGBUR NEGATIVE 03/28/2019 1104   BILIRUBINUR NEGATIVE 03/28/2019 1104   KETONESUR NEGATIVE 03/28/2019 1104   PROTEINUR 100 (A) 03/28/2019 1104   NITRITE NEGATIVE 03/28/2019 1104   LEUKOCYTESUR NEGATIVE 03/28/2019 1104      Component Value Date/Time   PHART 7.298 (L) 03/29/2019 2228   PCO2ART 46.5 03/29/2019 2228   PO2ART 114.0 (H) 03/29/2019 2228   HCO3 22.7 03/29/2019 2228   TCO2 24 03/29/2019 2228   ACIDBASEDEF 4.0 (H) 03/29/2019 2228   O2SAT 98.0 03/29/2019 2228       Component Value Date/Time   IRON 11 (L) 04/01/2019 0501   TIBC 214 (L) 04/01/2019 0501   FERRITIN 97 04/01/2019 0501   IRONPCTSAT 5 (L) 04/01/2019 0501       ASSESSMENT/PLAN:    68 year old male patient with chronic kidney disease stage V with a baseline serum creatinine around 4-4.5 who presented with an NSTEMI status post cardiac catheterization with multivessel disease.  He is status post CABG on 03/29/19.  1.   Chronic kidney disease stage V.  His baseline serum creatinine runs around 4-4.5.  He does have underlying diabetes and hypertension.  His serum creatinine is slowly trending up.  No evidence of uremia.  He does have a functioning RUE AV fistula.  He remains nonoliguric.  50 cc of contrast was used on 03/27/2023 his cardiac catheterization.  No urgent need for dialysis now.  We will continue to follow closely.  PTH is 260.  Creatinine is stable.  2.  Hyperkalemia.  On [place].  Potassium improved today.  3.  NSTEMI with multivessel disease status post cardiac catheterization on 03/27/2019 with three-vessel disease, now status post CABG on 03/29/2019.  Per cardiology.  4.  Hypertension.  Continue current medications.  5.  Anemia.  Continue to monitor.  Iron depletion noted.  We will continue IV iron.  6.  Hyperphosphatemia.  sevelamer added 04/01/2019.  7.  Secondary hyperparathyroidism.  Continue calcitriol and phosphate binder.  Okay for disposition from renal standpoint.  Will follow renal function closely as an outpatient.  No signs of uremia.      El T[place]ao, DO, [place]

## 2019-04-03 NOTE — Discharge Instructions (Signed)

## 2019-04-03 NOTE — Progress Notes (Signed)
PT arrived from [facility]. Telebox applied/ccmd notified. CHG bath given. Vital stable. Pt given call bell and oriented to room. Will continue to monitor.  Jerald Kief, RN

## 2019-04-04 LAB — GLUCOSE, CAPILLARY: Glucose-Capillary: 131 mg/dL — ABNORMAL HIGH (ref 70–99)

## 2019-04-04 LAB — BASIC METABOLIC PANEL
Anion gap: 13 (ref 5–15)
BUN: 65 mg/dL — ABNORMAL HIGH (ref 8–23)
CO2: 20 mmol/L — ABNORMAL LOW (ref 22–32)
Calcium: 9.5 mg/dL (ref 8.9–10.3)
Chloride: 103 mmol/L (ref 98–111)
Creatinine, Ser: 5.4 mg/dL — ABNORMAL HIGH (ref 0.61–1.24)
GFR calc Af Amer: 12 mL/min — ABNORMAL LOW (ref >60)
GFR calc non Af Amer: 10 mL/min — ABNORMAL LOW (ref >60)
Glucose, Bld: 161 mg/dL — ABNORMAL HIGH (ref 70–99)
Potassium: 4.9 mmol/L (ref 3.5–5.1)
Sodium: 136 mmol/L (ref 135–145)

## 2019-04-04 MED ORDER — OXYCODONE HCL 5 MG PO TABS: 5.0000 mg | ORAL_TABLET | ORAL | 0 refills | Status: DC | PRN

## 2019-04-04 MED ORDER — SEVELAMER CARBONATE 800 MG PO TABS: 800.0000 mg | ORAL_TABLET | Freq: Three times a day (TID) | ORAL | 1 refills | Status: DC

## 2019-04-04 MED ORDER — GABAPENTIN 100 MG PO CAPS: 200.0000 mg | ORAL_CAPSULE | Freq: Every day | ORAL | Status: DC

## 2019-04-04 MED ORDER — PATIROMER SORBITEX CALCIUM 8.4 G PO PACK: 8.4000 g | PACK | Freq: Every day | ORAL | 0 refills | Status: DC

## 2019-04-04 MED ORDER — METOPROLOL SUCCINATE ER 50 MG PO TB24
50.0000 mg | ORAL_TABLET | Freq: Every day | ORAL | Status: DC
  Administered 2019-04-04: 09:00:00 50 mg via ORAL
  Filled 2019-04-04: qty 1

## 2019-04-04 NOTE — Progress Notes (Signed)
      [address]       ,[place] [address]             [phone]      6 Days Post-Op Procedure(s) (LRB): CORONARY ARTERY BYPASS GRAFTING (CABG) x 4, using left internal mammary artery and right leg greater saphenous vein harvested endoscopically (N/A) TRANSESOPHAGEAL ECHOCARDIOGRAM (TEE) (N/A) Subjective: Feels good today and ready for discharge home.  Objective: Vital signs in last 24 hours: Temp:  [98.5 F (36.9 C)-99.7 F (37.6 C)] 98.5 F (36.9 C) (05/12 0354) Pulse Rate:  [78-256] 86 (05/12 0354) Cardiac Rhythm: Normal sinus rhythm (05/11 1900) Resp:  [14-24] 17 (05/12 0354) BP: (118-148)/(61-76) 131/76 (05/12 0354) SpO2:  [92 %-98 %] 95 % (05/12 0354) Weight:  [99.2 kg] 99.2 kg (05/12 0354)     Intake/Output from previous day: 05/11 0701 - 05/12 0700 In: 605.1 [P.O.:600; I.V.:5.1] Out: 2600 [Urine:2600] Intake/Output this shift: Total I/O In: -  Out: 1000 [Urine:1000]  General appearance: alert, cooperative and no distress Heart: regular rate and rhythm, S1, S2 normal, no murmur, click, rub or gallop Lungs: clear to auscultation bilaterally Abdomen: soft, non-tender; bowel sounds normal; no masses,  no organomegaly Extremities: 1+ nonpitting edema in lower extremity Wound: clean and dry  Lab Results: Recent Labs    04/02/19 0423 04/03/19 0248  WBC 7.7 7.9  HGB 8.0* 8.1*  HCT 24.7* 24.9*  PLT 105* 126*   BMET:  Recent Labs    04/03/19 0248 04/04/19 0312  NA 136 136  K 4.7 4.9  CL 103 103  CO2 22 20*  GLUCOSE 139* 161*  BUN 65* 65*  CREATININE 5.33* 5.40*  CALCIUM 9.5 9.5    PT/INR: No results for input(s): LABPROT, INR in the last 72 hours. ABG    Component Value Date/Time   PHART 7.298 (L) 03/29/2019 2228   HCO3 22.7 03/29/2019 2228   TCO2 24 03/29/2019 2228   ACIDBASEDEF 4.0 (H) 03/29/2019 2228   O2SAT 98.0 03/29/2019 2228   CBG (last 3)  Recent Labs    04/03/19 1656 04/03/19 2123 04/04/19 0610  GLUCAP  115* 158* 131*    Assessment/Plan: S/P Procedure(s) (LRB): CORONARY ARTERY BYPASS GRAFTING (CABG) x 4, using left internal mammary artery and right leg greater saphenous vein harvested endoscopically (N/A) TRANSESOPHAGEAL ECHOCARDIOGRAM (TEE) (N/A)  1. CV- NSR in the 80s, BP well controlled.  2. Pulm-tolerating room air with good oxygen saturation 3. Renal-CKD stage V, creatinine 5.4, baseline 4.5. electrolytes okay 4. Endo-blood glucose level well controlled on current regimen 5. H and H 8.1/24.9- expected anemia due to acute blood loss  Plan: Likely discharge home later today. Discharge instructions reviewed with the patient.   LOS: 11 days    Elgie Collard 04/04/2019

## 2019-04-04 NOTE — Progress Notes (Signed)
D/C instruction given to patient including sternal precautions, wound care and medications. All questions answered. CT sutures removed. IV removed, clean and intact. Wife to escort pt home.  Clyde Canterbury, RN

## 2019-04-04 NOTE — Progress Notes (Signed)
Cardiac Rehab Advisory Cardiac Rehab Phase I is not seeing pts face to face at this time due to Covid 19 restrictions. Ambulation is occurring through nursing, PT, and mobility teams. We will help facilitate that process as needed. We are calling pts in their rooms and discussing education. We will then deliver education materials to pts RN for delivery to pt.   [phone] Called pt to complete education. Discussed importance of IS, walking for ex, sternal precautions and staying in the tube. Discussed incision care. Discussed healthy food choices and watching carbs. Discussed CRP 2 and referred to [facility] program. Pt not interested in watching discharge video. Will give ed materials to RN to give to pt. Graylon Good RN BSN 04/04/2019 8:57 AM

## 2019-04-04 NOTE — Progress Notes (Signed)
6 Days Post-Op Procedure(s) (LRB): CORONARY ARTERY BYPASS GRAFTING (CABG) x 4, using left internal mammary artery and right leg greater saphenous vein harvested endoscopically (N/A) TRANSESOPHAGEAL ECHOCARDIOGRAM (TEE) (N/A) Subjective: No complaints  Objective: Vital signs in last 24 hours: Temp:  [98.5 F (36.9 C)-99.7 F (37.6 C)] 98.5 F (36.9 C) (05/12 0354) Pulse Rate:  [78-256] 86 (05/12 0354) Cardiac Rhythm: Normal sinus rhythm (05/11 1900) Resp:  [14-24] 17 (05/12 0354) BP: (118-148)/(61-76) 131/76 (05/12 0354) SpO2:  [92 %-98 %] 95 % (05/12 0354) Weight:  [99.2 kg] 99.2 kg (05/12 0354)  Hemodynamic parameters for last 24 hours:    Intake/Output from previous day: 05/11 0701 - 05/12 0700 In: 605.1 [P.O.:600; I.V.:5.1] Out: 2600 [Urine:2600] Intake/Output this shift: Total I/O In: -  Out: 1000 [Urine:1000]  General appearance: alert, cooperative and no distress Neurologic: intact Heart: regular rate and rhythm Lungs: clear to auscultation bilaterally Wound: clean and dry, sternum stable  Lab Results: Recent Labs    04/02/19 0423 04/03/19 0248  WBC 7.7 7.9  HGB 8.0* 8.1*  HCT 24.7* 24.9*  PLT 105* 126*   BMET:  Recent Labs    04/03/19 0248 04/04/19 0312  NA 136 136  K 4.7 4.9  CL 103 103  CO2 22 20*  GLUCOSE 139* 161*  BUN 65* 65*  CREATININE 5.33* 5.40*  CALCIUM 9.5 9.5    PT/INR: No results for input(s): LABPROT, INR in the last 72 hours. ABG    Component Value Date/Time   PHART 7.298 (L) 03/29/2019 2228   HCO3 22.7 03/29/2019 2228   TCO2 24 03/29/2019 2228   ACIDBASEDEF 4.0 (H) 03/29/2019 2228   O2SAT 98.0 03/29/2019 2228   CBG (last 3)  Recent Labs    04/03/19 1656 04/03/19 2123 04/04/19 0610  GLUCAP 115* 158* 131*    Assessment/Plan: S/P Procedure(s) (LRB): CORONARY ARTERY BYPASS GRAFTING (CABG) x 4, using left internal mammary artery and right leg greater saphenous vein harvested endoscopically (N/A) TRANSESOPHAGEAL  ECHOCARDIOGRAM (TEE) (N/A) Plan for discharge: see discharge orders  Doing well CBG OK off levemir- will dc on home regimen Dc home today   LOS: 11 days    Melrose Nakayama 04/04/2019

## 2019-04-10 ENCOUNTER — Other Ambulatory Visit: Payer: Self-pay | Admitting: Thoracic Surgery (Cardiothoracic Vascular Surgery)

## 2019-04-10 DIAGNOSIS — Z736 Limitation of activities due to disability: Secondary | ICD-10-CM

## 2019-04-18 NOTE — Progress Notes (Signed)
Virtual Visit via Video Note   This visit type was conducted due to national recommendations for restrictions regarding the COVID-19 Pandemic (e.g. social distancing) in an effort to limit this patient's exposure and mitigate transmission in our community.  Due to his co-morbid illnesses, this patient is at least at moderate risk for complications without adequate follow up.  This format is felt to be most appropriate for this patient at this time.  All issues noted in this document were discussed and addressed.  A limited physical exam was performed with this format.  Please refer to the patient's chart for his consent to telehealth for [facility]   Had to be changed to telephone when the audio portion of his computer was not working correctly.   Date:  04/19/2019   ID:  John [name], DOB August 02, 1951, MRN [id number]  Patient Location: Home Provider Location: Home  PCP:  Imagene Riches, NP  Cardiologist:  Dr. Agustin Cree  Electrophysiologist:  None   Evaluation Performed:  Follow-Up Visit  Chief Complaint:  Post hospital follow up post CABG  History of Present Illness:    John [name] is a 68 y.o. male we are following posthospitalization after admission for recurrent chest pain.  The patient has a history of coronary artery disease with prior PCI to the RCA, type 2 diabetes, hypertension, PAD status post aortofemoral bypass, end-stage IV chronic kidney disease.  He was found to have elevated troponin, and therefore cardiac catheterization was completed on 03/27/2019.  Cardiac cath revealed severe multivessel disease to include 90% mid RCA lesion, ostial circumflex to proximal circumflex lesion 80% stenosed, proximal LAD to mid LAD 75% stenosed with markedly positive DFR 0.71, first diagonal was 70% stenosed, proximal RCA to mid RCA was 35% stenosed, distal RCA 1 lesion 40% stenosis was prior stent revealing mild in-stent restenosis, distal RCA to lesion 80% stenosed.  The  patient was referred to CVTS for surgical consultation for CABG.  The patient underwent a four-vessel CABG on 03/29/2019 with LIMA to LAD, SVG to diagonal, SVG to OM, and SVG to PDA.  He had endoscopic harvesting of the greater saphenous vein from his right leg.  Postoperatively he had thrombocytopenia on Lovenox and this was discontinued, he was also found to have postoperative CHF and was diuresed aggressively.  He was followed by nephrology throughout hospital course.  He was discharged on 04/03/2019.  He states he is doing well with the exception of his back, shoulders, and neck, which are sore. He has been walking up to 10 minutes a day. He gets a little tired but is not experiencing profound fatigue or significant dyspnea. He has some mild LEE in the right leg, but not significant. Vein harvest site is reported as healing without evidence of infection. He is medically compliant and has no issues with his medications.   The patient does not have symptoms concerning for COVID-19 infection (fever, chills, cough, or new shortness of breath).    Past Medical History:  Diagnosis Date   Diabetes mellitus without complication ([place])    Hypertension    MI (myocardial infarction) ([place])    Renal disorder    Past Surgical History:  Procedure Laterality Date   ABDOMINAL AORTIC ANEURYSM REPAIR     APPENDECTOMY     AV FISTULA PLACEMENT Right 10/18/2018   Procedure: BRACHIO-CEPHALIC ARTERIOVENOUS (AV) FISTULA CREATION;  Surgeon: Angelia Mould, MD;  Location: [place];  Service: Vascular;  Laterality: Right;   CARDIAC CATHETERIZATION  CORONARY ANGIOPLASTY     CORONARY ARTERY BYPASS GRAFT N/A 03/29/2019   Procedure: CORONARY ARTERY BYPASS GRAFTING (CABG) x 4, using left internal mammary artery and right leg greater saphenous vein harvested endoscopically;  Surgeon: Melrose Nakayama, MD;  Location: [place];  Service: Open Heart Surgery;  Laterality: N/A;   CORONARY PRESSURE WIRE/FFR  WITH 3D MAPPING N/A 03/27/2019   Procedure: Coronary Pressure Wire/FFR w/3D Mapping;  Surgeon: Jettie Booze, MD;  Location: [place] CV [facility];  Service: Cardiovascular;  Laterality: N/A;   IR FLUORO GUIDE CV LINE RIGHT  09/13/2017   IR REMOVAL TUN CV CATH W/O FL  10/22/2017   IR US GUIDE VASC ACCESS RIGHT  09/13/2017   LEFT HEART CATH AND CORONARY ANGIOGRAPHY N/A 03/27/2019   Procedure: LEFT HEART CATH AND CORONARY ANGIOGRAPHY;  Surgeon: Jettie Booze, MD;  Location: [place] [facility];  Service: Cardiovascular;  Laterality: N/A;   NECK SURGERY     SMALL INTESTINE SURGERY     TEE WITHOUT CARDIOVERSION N/A 09/10/2017   Procedure: TRANSESOPHAGEAL ECHOCARDIOGRAM (TEE);  Surgeon: Skeet Latch, MD;  Location: [place];  Service: Cardiovascular;  Laterality: N/A;   TEE WITHOUT CARDIOVERSION N/A 03/29/2019   Procedure: TRANSESOPHAGEAL ECHOCARDIOGRAM (TEE);  Surgeon: Melrose Nakayama, MD;  Location: [place];  Service: Open Heart Surgery;  Laterality: N/A;     Current Meds  Medication Sig   acetaminophen (TYLENOL) 500 MG tablet Take 1,500 mg by mouth 2 (two) times daily as needed (pain).   amLODipine (NORVASC) 5 MG tablet Take 5 mg by mouth daily.   aspirin EC 81 MG tablet Take 81 mg by mouth daily.   atorvastatin (LIPITOR) 40 MG tablet Take 40 mg by mouth daily.   calcitRIOL (ROCALTROL) 0.5 MCG capsule Take 0.5 mcg by mouth daily.   Dulaglutide (TRULICITY) 1.5 AY/3.0ZS SOPN Inject 1.5 mg into the skin every Sunday.   gabapentin (NEURONTIN) 100 MG capsule Take 2 capsules (200 mg total) by mouth at bedtime.   glipiZIDE (GLUCOTROL XL) 2.5 MG 24 hr tablet Take 2.5 mg by mouth 2 (two) times daily.   linagliptin (TRADJENTA) 5 MG TABS tablet Take 5 mg by mouth daily.   metoprolol succinate (TOPROL-XL) 50 MG 24 hr tablet Take 50 mg by mouth daily.   OVER THE COUNTER MEDICATION Place 1 drop into both eyes 2 (two) times daily as needed (dry eyes). Hyper Tears eye  drops   oxyCODONE (OXY IR/ROXICODONE) 5 MG immediate release tablet Take 1-2 tablets (5-10 mg total) by mouth every 4 (four) hours as needed for severe pain.   patiromer (VELTASSA) 8.4 g packet Take 1 packet (8.4 g total) by mouth daily at 3 pm.   rOPINIRole (REQUIP) 0.25 MG tablet Take 0.25 mg by mouth at bedtime.   sevelamer carbonate (RENVELA) 800 MG tablet Take 1 tablet (800 mg total) by mouth 3 (three) times daily with meals.   tamsulosin (FLOMAX) 0.4 MG CAPS capsule Take 0.4 mg by mouth daily before supper.   zolpidem (AMBIEN) 5 MG tablet Take 10 mg by mouth at bedtime.     Allergies:   Penicillins   Social History   Tobacco Use   Smoking status: Former Smoker   Smokeless tobacco: Never Used  Substance Use Topics   Alcohol use: Yes    Comment: Occasionally.   Drug use: Never     Family Hx: The patient's family history includes Cancer in his maternal grandmother.  ROS:   Please see the history of  present illness.    All other systems reviewed and are negative.   Prior CV studies:   The following studies were reviewed today:  Echocardiogram 03/25/2019 . The left ventricle has low normal systolic function, with an ejection fraction of 50-55%. The cavity size was mild to moderately dilated. Left ventricular diastolic Doppler parameters are consistent with impaired relaxation.  2. The right ventricle has normal systolic function. The cavity was normal. There is no increase in right ventricular wall thickness.  3. Left atrial size was moderately dilated.  4. The aortic valve is tricuspid. Mild calcification of the aortic valve.  5. The aortic root is normal in size and structure.  6. The inferior vena cava was dilated in size with >50% respiratory variability.  Cardiac Cath 5.4.2020  Mid Cx lesion is 90% stenosed.  Ost Cx to Prox Cx lesion is 80% stenosed.  Prox LAD to Mid LAD lesion is 75% stenosed. This lesion is hemodynamically significant and had a markedly  positive DFR 0.71.  Ost 1st Diag lesion is 70% stenosed.  Prox RCA to Mid RCA lesion is 75% stenosed.  Dist RCA-1 lesion is 40% stenosed. Prior stent with mild instent restenosis.  Dist RCA-2 lesion is 80% stenosed.  LV end diastolic pressure is normal.  There is no aortic valve stenosis.  CABG 03/29/2019 Median sternotomy, extracorporeal circulation coronary artery bypass grafting x4 (left internal mammary artery to left anterior descending, saphenous vein graft to first diagonal, saphenous vein graft to obtuse marginal 1, saphenous vein graft to posterior, endoscopic vein harvest, right leg.   Labs/Other Tests and Data Reviewed:    EKG:  No ECG reviewed.  Recent Labs: 03/29/2019: ALT 37 03/30/2019: Magnesium 2.2 04/03/2019: Hemoglobin 8.1; Platelets 126 04/04/2019: BUN 65; Creatinine, Ser 5.40; Potassium 4.9; Sodium 136   Recent Lipid Panel Lab Results  Component Value Date/Time   CHOL 99 03/25/2019 04:54 AM   TRIG 80 03/25/2019 04:54 AM   HDL 39 (L) 03/25/2019 04:54 AM   CHOLHDL 2.5 03/25/2019 04:54 AM   LDLCALC 44 03/25/2019 04:54 AM    Wt Readings from Last 3 Encounters:  04/19/19 219 lb (99.3 kg)  04/04/19 218 lb 11.2 oz (99.2 kg)  12/01/18 205 lb (93 kg)     Objective:    Vital Signs:  BP 128/68    Pulse 84    Ht 6\' 2"  (1.88 m)    Wt 219 lb (99.3 kg)    BMI 28.12 kg/m    VITAL SIGNS:  reviewed GEN:  no acute distress RESPIRATORY:  normal respiratory effort, symmetric expansion NEURO:  alert and oriented x 3, no obvious focal deficit PSYCH:  normal affect  ASSESSMENT & PLAN:    1. CAD: Severe multivessel per cardiac cath requiring CVTS consultation. He had CABG X 4 as above. He is recovering well with some soreness attributed to healing process. He sees Dr. Roxan Hockey on May 02, 2019. He has already been contacted by cardiac rehab in [place]. Will continue current regimen and walking program which he is to begin with slow interval increases of 5 minutes a  week. He is to call us for any problems. He will continue metoprolol 50 mg daily. Not on ACE due to CKD Stage IV.  Multiple questions and explanations are discussed with the patient today and he has verbalized understanding.   2. Hypertension: Has been well controlled. No changes in his regimen. Continue amlodipine 5 mg daily.  3. Hyperlipidemia: Goal of LDL < 70. Last evaluation on  03/25/2019 LDL was reported as 44. He will continue on statin therapy with atorvastatin 40 mg daily..  4. CKD Stage IV: Followed by nephrology   COVID-19 Education: The signs and symptoms of COVID-19 were discussed with the patient and how to seek care for testing (follow up with PCP or arrange E-visit).  The importance of social distancing was discussed today.  Time:   Today, I have spent 42minutes with the patient with telehealth technology discussing the above problems.     Medication Adjustments/Labs and Tests Ordered: Current medicines are reviewed at length with the patient today.  Concerns regarding medicines are outlined above.   Tests Ordered: No orders of the defined types were placed in this encounter.   Medication Changes: No orders of the defined types were placed in this encounter.   Disposition:  Follow up 3 months   Signed, Phill Myron. West Pugh, ANP, AACC  04/19/2019 11:20 AM    [facility]

## 2019-04-19 ENCOUNTER — Telehealth (INDEPENDENT_AMBULATORY_CARE_PROVIDER_SITE_OTHER): Payer: Medicare Other | Admitting: Adult Health

## 2019-04-19 VITALS — BP 128/68 | HR 84 | Ht 74.0 in | Wt 219.0 lb

## 2019-04-19 DIAGNOSIS — N184 Chronic kidney disease, stage 4 (severe): Secondary | ICD-10-CM

## 2019-04-19 DIAGNOSIS — I2510 Atherosclerotic heart disease of native coronary artery without angina pectoris: Secondary | ICD-10-CM | POA: Diagnosis not present

## 2019-04-19 DIAGNOSIS — I10 Essential (primary) hypertension: Secondary | ICD-10-CM

## 2019-04-19 DIAGNOSIS — E7800 Pure hypercholesterolemia, unspecified: Secondary | ICD-10-CM

## 2019-04-19 NOTE — Patient Instructions (Addendum)
Follow-Up: You will need a follow up appointment in 6 months.  Please call our office 2 months in advance, September 2020 to schedule this appointment.  You may see Jenne Campus, MD or another member of our [place] Provider Team in [place]:  Shirlee More, MD  Jyl Heinz, MD      Medication Instructions:  NO CHANGES- Your physician recommends that you continue on your current medications as directed. Please refer to the Current Medication list given to you today. If you need a refill on your cardiac medications before your next appointment, please call your pharmacy. Labwork: When you have labs (blood work) and your tests are completely normal, you will receive your results ONLY by [place] (if you have MyChart) -OR- A paper copy in the mail.  At [facility], you and your health needs are our priority.  As part of our continuing mission to provide you with exceptional heart care, we have created designated Provider Care Teams.  These Care Teams include your primary Cardiologist (physician) and Advanced Practice Providers (APPs -  Physician Assistants and Nurse Practitioners) who all work together to provide you with the care you need, when you need it.  Thank you for choosing [facility] at [facility]!!

## 2019-04-26 ENCOUNTER — Other Ambulatory Visit: Payer: Self-pay | Admitting: Physician Assistant

## 2019-05-01 ENCOUNTER — Other Ambulatory Visit: Payer: Self-pay | Admitting: Thoracic Surgery (Cardiothoracic Vascular Surgery)

## 2019-05-01 ENCOUNTER — Other Ambulatory Visit: Payer: Self-pay

## 2019-05-01 DIAGNOSIS — Z951 Presence of aortocoronary bypass graft: Secondary | ICD-10-CM

## 2019-05-02 ENCOUNTER — Ambulatory Visit
Admission: RE | Admit: 2019-05-02 | Discharge: 2019-05-02 | Disposition: A | Discharge status: Home / Self Care | Payer: Medicare Other | Source: Ambulatory Visit | Attending: Thoracic Surgery (Cardiothoracic Vascular Surgery) | Admitting: Thoracic Surgery (Cardiothoracic Vascular Surgery)

## 2019-05-02 ENCOUNTER — Ambulatory Visit (INDEPENDENT_AMBULATORY_CARE_PROVIDER_SITE_OTHER): Payer: Self-pay | Admitting: Thoracic Surgery (Cardiothoracic Vascular Surgery)

## 2019-05-02 ENCOUNTER — Encounter: Payer: Self-pay | Admitting: Thoracic Surgery (Cardiothoracic Vascular Surgery)

## 2019-05-02 VITALS — BP 131/74 | HR 90 | Temp 97.7°F | Resp 16 | Ht 74.0 in | Wt 215.4 lb

## 2019-05-02 DIAGNOSIS — Z951 Presence of aortocoronary bypass graft: Secondary | ICD-10-CM

## 2019-05-02 DIAGNOSIS — I2510 Atherosclerotic heart disease of native coronary artery without angina pectoris: Secondary | ICD-10-CM

## 2019-05-02 MED ORDER — PREDNISONE 10 MG (21) PO TBPK: ORAL_TABLET | ORAL | 0 refills | Status: AC

## 2019-05-02 NOTE — Progress Notes (Signed)
[address]       [place] [address]             [phone]     HPI: [name] returns for a scheduled follow-up visit  John [name] is a 68 year old gentleman with a history of CAD, MI, prior PCI, type 2 diabetes, hypertension, stage IV CKD, and peripheral arterial disease.  He presented with chest pain.  He had a mild elevation of troponin.  At catheterization he was found to have severe three-vessel disease.  Echocardiogram showed ejection fraction of 50 to 55%.  He underwent coronary artery bypass grafting x4 on 03/29/2019.  His postoperative course was uncomplicated and he went home on day 6.  His creatinine did rise but he did not require any intervention.  Since discharge he has been feeling well.  He has not had any recurrent angina.  He denies any shortness of breath.  He has had a cough.  He saw nephrology in follow-up and they said his creatinine was improved.  Past Medical History:  Diagnosis Date  . Diabetes mellitus without complication ([place])   . Hypertension   . MI (myocardial infarction) ([place])   . Renal disorder     Current Outpatient Medications  Medication Sig Dispense Refill  . acetaminophen (TYLENOL) 500 MG tablet Take 1,500 mg by mouth 2 (two) times daily as needed (pain).    Marland Kitchen amLODipine (NORVASC) 5 MG tablet Take 5 mg by mouth daily.  12  . aspirin EC 81 MG tablet Take 81 mg by mouth daily.    Marland Kitchen atorvastatin (LIPITOR) 40 MG tablet Take 40 mg by mouth daily.    . calcitRIOL (ROCALTROL) 0.5 MCG capsule Take 0.5 mcg by mouth daily.  3  . Dulaglutide (TRULICITY) 1.5 DH/7.4BU SOPN Inject 1.5 mg into the skin every Sunday.    . ferrous sulfate 325 (65 FE) MG tablet Take 325 mg by mouth 2 (two) times daily with a meal.    . gabapentin (NEURONTIN) 100 MG capsule Take 2 capsules (200 mg total) by mouth at bedtime.    Marland Kitchen glipiZIDE (GLUCOTROL XL) 2.5 MG 24 hr tablet Take 2.5 mg by mouth 2 (two) times daily.    Marland Kitchen linagliptin (TRADJENTA) 5 MG  TABS tablet Take 5 mg by mouth daily.    . metoprolol succinate (TOPROL-XL) 50 MG 24 hr tablet Take 50 mg by mouth daily.  0  . OVER THE COUNTER MEDICATION Place 1 drop into both eyes 2 (two) times daily as needed (dry eyes). Hyper Tears eye drops    . patiromer (VELTASSA) 8.4 g packet Take 1 packet (8.4 g total) by mouth daily at 3 pm. 30 packet 0  . rOPINIRole (REQUIP) 0.25 MG tablet Take 0.25 mg by mouth at bedtime.    . sevelamer carbonate (RENVELA) 800 MG tablet Take 1 tablet (800 mg total) by mouth 3 (three) times daily with meals. 90 tablet 1  . tamsulosin (FLOMAX) 0.4 MG CAPS capsule Take 0.4 mg by mouth daily before supper.  2  . zolpidem (AMBIEN) 5 MG tablet Take 10 mg by mouth at bedtime.    . predniSONE (STERAPRED UNI-PAK 21 TAB) 10 MG (21) TBPK tablet Take 5 tablets (50 mg total) by mouth daily for 1 day, THEN 4 tablets (40 mg total) daily for 1 day, THEN 3 tablets (30 mg total) daily for 1 day, THEN 2 tablets (20 mg total) daily for 1 day, THEN 1 tablet (10 mg  total) daily for 1 day. 15 tablet 0   No current facility-administered medications for this visit.     Physical Exam BP 131/74 (BP Location: Left Arm, Patient Position: Sitting, Cuff Size: Normal)   Pulse 90   Temp 97.7 F (36.5 C) (Skin)   Resp 16   Ht 6\' 2"  (1.88 m)   Wt 215 lb 6.4 oz (97.7 kg)   SpO2 94% Comment: RA  BMI 27.27 kg/m  68 year old man in no acute distress Alert and oriented x3 deficits Lungs clear with equal breath sounds bilaterally Cardiac regular rate and rhythm normal S1 and S2 Sternum stable, incision well-healed Leg incision well-healed no peripheral edema  Diagnostic Tests: CHEST - 2 VIEW  COMPARISON:  03/31/2019  FINDINGS: The right IJ central venous Cordis has been removed.  Mild stable cardiac enlargement and tortuous calcified thoracic aorta.  There is a small persistent left pleural effusion and some overlying atelectasis. No pulmonary edema. The right lung is clear.   IMPRESSION: Persistent small left effusion with overlying atelectasis but no edema or pneumothorax.   Electronically Signed   By: Marijo Sanes M.D.   On: 05/02/2019 12:07 I personally reviewed the chest x-ray images and concur with the findings noted above  Impression: John [name] is a 68 year old gentleman with multiple cardiac risk factors who presented with angina.  He had severe three-vessel coronary disease.  He underwent coronary artery bypass grafting x4 on 03/29/2019.  His postoperative course was uncomplicated.  From a cardiac standpoint he is doing well.  He has not had any recurrent angina.  His incisions are healing well.  Nephrology is following him for stage IV chronic kidney disease.  Apparently that is stable as well.  He may begin driving.  Appropriate precautions were discussed.  He should not lift anything over 10 pounds for another 2 weeks and nothing over 20 pounds for another 4 weeks.  Beyond that his activities are unrestricted.  He does have a small left pleural effusion.  I do not think this is large enough to warrant thoracentesis.  We will plan to treat with a prednisone taper.  I will repeat a chest x-ray in about a month.  Plan:  Prednisone taper Return in 4 weeks with PA and lateral chest x-ray  Melrose Nakayama, MD [facility] [phone]

## 2019-05-25 ENCOUNTER — Other Ambulatory Visit: Payer: Self-pay | Admitting: Physician Assistant

## 2019-05-29 ENCOUNTER — Other Ambulatory Visit: Payer: Self-pay | Admitting: Thoracic Surgery (Cardiothoracic Vascular Surgery)

## 2019-05-29 DIAGNOSIS — Z951 Presence of aortocoronary bypass graft: Secondary | ICD-10-CM

## 2019-05-30 ENCOUNTER — Other Ambulatory Visit: Payer: Self-pay

## 2019-05-30 ENCOUNTER — Ambulatory Visit
Admission: RE | Admit: 2019-05-30 | Discharge: 2019-05-30 | Disposition: A | Discharge status: Home / Self Care | Payer: Medicare Other | Source: Ambulatory Visit | Attending: Thoracic Surgery (Cardiothoracic Vascular Surgery) | Admitting: Thoracic Surgery (Cardiothoracic Vascular Surgery)

## 2019-05-30 ENCOUNTER — Ambulatory Visit (INDEPENDENT_AMBULATORY_CARE_PROVIDER_SITE_OTHER): Payer: Self-pay | Admitting: Thoracic Surgery (Cardiothoracic Vascular Surgery)

## 2019-05-30 ENCOUNTER — Encounter: Payer: Self-pay | Admitting: Thoracic Surgery (Cardiothoracic Vascular Surgery)

## 2019-05-30 VITALS — BP 140/77 | HR 80 | Temp 97.3°F | Resp 16 | Ht 74.0 in | Wt 208.4 lb

## 2019-05-30 DIAGNOSIS — Z951 Presence of aortocoronary bypass graft: Secondary | ICD-10-CM

## 2019-05-30 DIAGNOSIS — I2510 Atherosclerotic heart disease of native coronary artery without angina pectoris: Secondary | ICD-10-CM

## 2019-05-30 DIAGNOSIS — J90 Pleural effusion, not elsewhere classified: Secondary | ICD-10-CM

## 2019-05-30 NOTE — Progress Notes (Signed)
[address]       [place] [address]             [phone]     HPI: 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  He presented with chest pain.  He ruled in for non-ST elevation MI.  At catheterization he was found to have severe three-vessel disease with mild left ventricular dysfunction (EF 50%).  He underwent coronary artery bypass grafting x4 on 03/29/2019.  His postoperative course was uncomplicated and he went home on day 6.  I saw him in the office on 05/02/2019.  He had a small left pleural effusion.  We gave him a prednisone taper for that.  He feels well.  He is anxious to get back to work.  He still has an occasional cough.  He also complains of numbness in the left anterior chest wall.  No recurrent anginal symptoms.  Past Medical History:  Diagnosis Date  . Diabetes mellitus without complication ([place])   . Hypertension   . MI (myocardial infarction) ([place])   . Renal disorder     Current Outpatient Medications  Medication Sig Dispense Refill  . acetaminophen (TYLENOL) 500 MG tablet Take 1,500 mg by mouth 2 (two) times daily as needed (pain).    Marland Kitchen amLODipine (NORVASC) 5 MG tablet Take 5 mg by mouth daily.  12  . aspirin EC 81 MG tablet Take 81 mg by mouth daily.    Marland Kitchen atorvastatin (LIPITOR) 40 MG tablet Take 40 mg by mouth daily.    . calcitRIOL (ROCALTROL) 0.5 MCG capsule Take 0.5 mcg by mouth daily.  3  . Dulaglutide (TRULICITY) 1.5 IW/8.0HO SOPN Inject 1.5 mg into the skin every Sunday.    . ferrous sulfate 325 (65 FE) MG tablet Take 325 mg by mouth 2 (two) times daily with a meal.    . gabapentin (NEURONTIN) 100 MG capsule Take 2 capsules (200 mg total) by mouth at bedtime.    Marland Kitchen glipiZIDE (GLUCOTROL XL) 2.5 MG 24 hr tablet Take 2.5  mg by mouth 2 (two) times daily.    Marland Kitchen linagliptin (TRADJENTA) 5 MG TABS tablet Take 5 mg by mouth daily.    . metoprolol succinate (TOPROL-XL) 50 MG 24 hr tablet Take 50 mg by mouth daily.  0  . OVER THE COUNTER MEDICATION Place 1 drop into both eyes 2 (two) times daily as needed (dry eyes). Hyper Tears eye drops    . patiromer (VELTASSA) 8.4 g packet Take 1 packet (8.4 g total) by mouth daily at 3 pm. 30 packet 0  . rOPINIRole (REQUIP) 0.25 MG tablet Take 0.25 mg by mouth at bedtime.    . sevelamer carbonate (RENVELA) 800 MG tablet Take 1 tablet (800 mg total) by mouth 3 (three) times daily with meals. 90 tablet 1  . tamsulosin (FLOMAX) 0.4 MG CAPS capsule Take 0.4 mg by mouth daily before supper.  2  . zolpidem (AMBIEN) 5 MG tablet Take 10 mg by mouth at bedtime.     No current facility-administered medications for this visit.     Physical Exam BP 140/77 (BP Location: Left Arm, Patient Position: Sitting, Cuff Size: Normal)   Pulse 80   Temp (!) 97.3 F (36.3 C) Comment: thermal  Resp 16  Ht 6\' 2"  (1.88 m)   Wt 208 lb 6.4 oz (94.5 kg)   SpO2 95% Comment: RA  BMI 67.37 kg/m  John [name] in no acute distress Alert and oriented x3 with no focal deficits Lungs equal bilaterally Cardiac regular rate and rhythm normal S1 and S2 Sternum stable, incision healing well No peripheral edema  Diagnostic Tests: CHEST - 2 VIEW  COMPARISON:  May 02, 2019  FINDINGS: The cardiomediastinal silhouette is stable. No pneumothorax. Continued effusion with underlying atelectasis in the left base. The right lung is clear. No other interval changes.  IMPRESSION: 1. Continued effusion with underlying atelectasis in the lingula and left base. No interval changes.   Electronically Signed   By: Dorise Bullion III M.D   On: 05/30/2019 11:13 I personally reviewed the chest x-ray images.  There is still effusion present, however I think it is improved compared to his last chest x-ray.   Impression: John [name] is a John [name] with a history of type 2 diabetes, hypertension, stage IV chronic kidney disease, atherosclerotic cardiovascular disease, three-vessel coronary disease, and non-ST elevation MI.  He underwent coronary artery bypass grafting on 03/29/2019.  Overall he is doing well.  His exercise tolerance is good.  He has been driving his own vehicle without any issues.  His wounds are healing well and he has not had any recurrent angina.  He still has a residual small left pleural effusion after a prednisone taper.  I will think there is any further intervention necessary for that.  He is anxious to return to work.  I told him he can go back on Monday, July 20.  Plan: Okay to return to work on Monday, July 20 Follow-up with Dr. Nehemiah Massed I will be happy to see him back anytime in the future if I can be of any further assistance with his care.  Melrose Nakayama, MD [facility] [phone]

## 2019-06-04 ENCOUNTER — Other Ambulatory Visit: Payer: Self-pay | Admitting: Physician Assistant

## 2019-06-08 ENCOUNTER — Other Ambulatory Visit: Payer: Self-pay | Admitting: Physician Assistant

## 2019-06-08 NOTE — Telephone Encounter (Signed)
This encounter was created in error - please disregard.

## 2019-07-12 ENCOUNTER — Other Ambulatory Visit: Payer: Self-pay

## 2019-07-12 ENCOUNTER — Encounter: Payer: Self-pay | Admitting: Cardiology

## 2019-07-12 ENCOUNTER — Ambulatory Visit (INDEPENDENT_AMBULATORY_CARE_PROVIDER_SITE_OTHER): Payer: Medicare Other | Admitting: Cardiology

## 2019-07-12 VITALS — BP 150/80 | HR 82 | Ht 74.0 in | Wt 207.0 lb

## 2019-07-12 DIAGNOSIS — N185 Chronic kidney disease, stage 5: Secondary | ICD-10-CM | POA: Diagnosis not present

## 2019-07-12 DIAGNOSIS — Z951 Presence of aortocoronary bypass graft: Secondary | ICD-10-CM

## 2019-07-12 DIAGNOSIS — I10 Essential (primary) hypertension: Secondary | ICD-10-CM

## 2019-07-12 NOTE — Patient Instructions (Signed)
Medication Instructions:  Your physician recommends that you continue on your current medications as directed. Please refer to the Current Medication list given to you today.  If you need a refill on your cardiac medications before your next appointment, please call your pharmacy.   Lab work: NONE If you have labs (blood work) drawn today and your tests are completely normal, you will receive your results only by: . MyChart Message (if you have MyChart) OR . A paper copy in the mail If you have any lab test that is abnormal or we need to change your treatment, we will call you to review the results.  Testing/Procedures: NONE   Follow-Up: At [facility], you and your health needs are our priority.  As part of our continuing mission to provide you with exceptional heart care, we have created designated Provider Care Teams.  These Care Teams include your primary Cardiologist (physician) and Advanced Practice Providers (APPs -  Physician Assistants and Nurse Practitioners) who all work together to provide you with the care you need, when you need it. You will need a follow up appointment in 5 months.  Please call our office 2 months in advance to schedule this appointment.  You may see Robert Krasowski, MD or another member of our [facility] Provider Team in [place]: Brian Munley, MD . Rajan Revankar, MD  Any Other Special Instructions Will Be Listed Below (If Applicable).    

## 2019-07-12 NOTE — Progress Notes (Signed)
Cardiology Office Note:    Date:  07/12/2019   ID:  John [name], DOB [birth year], MRN [id number]  PCP:  Imagene Riches, NP  Cardiologist:  Jenne Campus, MD    Referring MD: Imagene Riches, NP   Chief Complaint  Patient presents with  . Work Clearance  Doing very well  History of Present Illness:    John [name] is a 68 y.o. male  The patient has a history of coronary artery disease with prior PCI to the RCA, type 2 diabetes, hypertension, PAD status post aortofemoral bypass, end-stage IV chronic kidney disease.  He was found to have elevated troponin, and therefore cardiac catheterization was completed on 03/27/2019.  Cardiac cath revealed severe multivessel disease to include 90% mid RCA lesion, ostial circumflex to proximal circumflex lesion 80% stenosed, proximal LAD to mid LAD 75% stenosed with markedly positive DFR 0.71, first diagonal was 70% stenosed, proximal RCA to mid RCA was 35% stenosed, distal RCA 1 lesion 40% stenosis was prior stent revealing mild in-stent restenosis, distal RCA to lesion 80% stenosed.  The patient was referred to CVTS for surgical consultation for CABG.  The patient underwent a four-vessel CABG on 03/29/2019 with LIMA to LAD, SVG to diagonal, SVG to OM, and SVG to PDA.  He had endoscopic harvesting of the greater saphenous vein from his right leg.  Postoperatively he had thrombocytopenia on Lovenox and this was discontinued, he was also found to have postoperative CHF and was diuresed aggressively.  He was followed by nephrology throughout hospital course.  He was discharged on 04/03/2019.  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  Denies having a chest but overall seems to be very optimistic.  Past Medical History:  Diagnosis Date  . Diabetes mellitus without complication ([place])   . Hypertension    . MI (myocardial infarction) ([place])   . Renal disorder     Past Surgical History:  Procedure Laterality Date  . ABDOMINAL AORTIC ANEURYSM REPAIR    . APPENDECTOMY    . AV FISTULA PLACEMENT Right 10/18/2018   Procedure: BRACHIO-CEPHALIC ARTERIOVENOUS (AV) FISTULA CREATION;  Surgeon: Angelia Mould, MD;  Location: [facility];  Service: Vascular;  Laterality: Right;  . CARDIAC CATHETERIZATION    . CORONARY ANGIOPLASTY    . CORONARY ARTERY BYPASS GRAFT N/A 03/29/2019   Procedure: CORONARY ARTERY BYPASS GRAFTING (CABG) x 4, using left internal mammary artery and right leg greater saphenous vein harvested endoscopically;  Surgeon: Melrose Nakayama, MD;  Location: [facility];  Service: Open Heart Surgery;  Laterality: N/A;  . CORONARY PRESSURE WIRE/FFR WITH 3D MAPPING N/A 03/27/2019   Procedure: Coronary Pressure Wire/FFR w/3D Mapping;  Surgeon: Jettie Booze, MD;  Location: [facility];  Service: Cardiovascular;  Laterality: N/A;  . IR FLUORO GUIDE CV LINE RIGHT  09/13/2017  . IR REMOVAL TUN CV CATH W/O FL  10/22/2017  . IR US GUIDE VASC ACCESS RIGHT  09/13/2017  . LEFT HEART CATH AND CORONARY ANGIOGRAPHY N/A 03/27/2019   Procedure: LEFT HEART CATH AND CORONARY ANGIOGRAPHY;  Surgeon: Jettie Booze, MD;  Location: [facility];  Service: Cardiovascular;  Laterality: N/A;  . NECK SURGERY    . SMALL INTESTINE SURGERY    . TEE WITHOUT CARDIOVERSION N/A 09/10/2017   Procedure: TRANSESOPHAGEAL ECHOCARDIOGRAM (TEE);  Surgeon: Oval Linsey,  Jonelle Sidle, MD;  Location: [facility];  Service: Cardiovascular;  Laterality: N/A;  . TEE WITHOUT CARDIOVERSION N/A 03/29/2019   Procedure: TRANSESOPHAGEAL ECHOCARDIOGRAM (TEE);  Surgeon: Melrose Nakayama, MD;  Location: [facility];  Service: Open Heart Surgery;  Laterality: N/A;    Current Medications: Current Meds  Medication Sig  . acetaminophen (TYLENOL) 500 MG tablet Take 1,500 mg by mouth 2 (two) times daily as needed (pain).  Marland Kitchen  amLODipine (NORVASC) 5 MG tablet Take 5 mg by mouth daily.  Marland Kitchen aspirin EC 81 MG tablet Take 81 mg by mouth daily.  Marland Kitchen atorvastatin (LIPITOR) 40 MG tablet Take 40 mg by mouth daily.  . calcitRIOL (ROCALTROL) 0.5 MCG capsule Take 0.5 mcg by mouth daily.  . Dulaglutide (TRULICITY) 1.5 0000000 SOPN Inject 1.5 mg into the skin every Sunday.  . ferrous sulfate 325 (65 FE) MG tablet Take 325 mg by mouth 2 (two) times daily with a meal.  . gabapentin (NEURONTIN) 100 MG capsule Take 2 capsules (200 mg total) by mouth at bedtime.  Marland Kitchen glipiZIDE (GLUCOTROL XL) 2.5 MG 24 hr tablet Take 2.5 mg by mouth 2 (two) times daily.  Marland Kitchen linagliptin (TRADJENTA) 5 MG TABS tablet Take 5 mg by mouth daily.  . metoprolol succinate (TOPROL-XL) 50 MG 24 hr tablet Take 50 mg by mouth daily.  Marland Kitchen OVER THE COUNTER MEDICATION Place 1 drop into both eyes 2 (two) times daily as needed (dry eyes). Hyper Tears eye drops  . patiromer (VELTASSA) 8.4 g packet Take 1 packet (8.4 g total) by mouth daily at 3 pm.  . rOPINIRole (REQUIP) 0.25 MG tablet Take 0.25 mg by mouth at bedtime.  . sevelamer carbonate (RENVELA) 800 MG tablet Take 1 tablet (800 mg total) by mouth 3 (three) times daily with meals.  . tamsulosin (FLOMAX) 0.4 MG CAPS capsule Take 0.4 mg by mouth daily before supper.  . zolpidem (AMBIEN) 5 MG tablet Take 10 mg by mouth at bedtime.     Allergies:   Penicillins   Social History   Socioeconomic History  . Marital status: Married    Spouse name: Not on file  . Number of children: Not on file  . Years of education: Not on file  . Highest education level: Not on file  Occupational History  . Not on file  Social Needs  . Financial resource strain: Not on file  . Food insecurity    Worry: Not on file    Inability: Not on file  . Transportation needs    Medical: Not on file    Non-medical: Not on file  Tobacco Use  . Smoking status: Former Research scientist (life sciences)  . Smokeless tobacco: Never Used  Substance and Sexual Activity  .  Alcohol use: Yes    Comment: Occasionally.  . Drug use: Never  . Sexual activity: Not on file  Lifestyle  . Physical activity    Days per week: Not on file    Minutes per session: Not on file  . Stress: Not on file  Relationships  . Social Herbalist on phone: Not on file    Gets together: Not on file    Attends religious service: Not on file    Active member of club or organization: Not on file    Attends meetings of clubs or organizations: Not on file    Relationship status: Not on file  Other Topics Concern  . Not on file  Social History Narrative  . Not on file  Family History: The patient's family history includes Cancer in his maternal grandmother. ROS:   Please see the history of present illness.    All 14 point review of systems negative except as described per history of present illness  EKGs/Labs/Other Studies Reviewed:      Recent Labs: 03/29/2019: ALT 37 03/30/2019: Magnesium 2.2 04/03/2019: Hemoglobin 8.1; Platelets 126 04/04/2019: BUN 65; Creatinine, Ser 5.40; Potassium 4.9; Sodium 136  Recent Lipid Panel    Component Value Date/Time   CHOL 99 03/25/2019 0454   TRIG 80 03/25/2019 0454   HDL 39 (L) 03/25/2019 0454   CHOLHDL 2.5 03/25/2019 0454   VLDL 16 03/25/2019 0454   LDLCALC 44 03/25/2019 0454    Physical Exam:    VS:  BP (!) 150/80   Pulse 82   Ht 6\' 2"  (1.88 m)   Wt 207 lb (93.9 kg)   SpO2 98%   BMI 26.58 kg/m     Wt Readings from Last 3 Encounters:  07/12/19 207 lb (93.9 kg)  05/30/19 208 lb 6.4 oz (94.5 kg)  05/02/19 215 lb 6.4 oz (97.7 kg)     GEN:  Well nourished, well developed in no acute distress HEENT: Normal NECK: No JVD; No carotid bruits LYMPHATICS: No lymphadenopathy CARDIAC: RRR, no murmurs, no rubs, no gallops RESPIRATORY:  Clear to auscultation without rales, wheezing or rhonchi  ABDOMEN: Soft, non-tender, non-distended MUSCULOSKELETAL:  No edema; No deformity  SKIN: Warm and dry LOWER EXTREMITIES: no  swelling NEUROLOGIC:  Alert and oriented x 3 PSYCHIATRIC:  Normal affect   ASSESSMENT:    1. S/P CABG x 4   2. Essential hypertension   3. CKD (chronic kidney disease), stage V ([place])    PLAN:    In order of problems listed above:  1. Coronary disease status post coronary bypass graft recover great after surgery.  On appropriate medications all reviewed will continue. 2. Essential hypertension blood pressure elevated today but he tells me that at home usually blood pressure is good.  We will continue monitoring it. 3. Dyslipidemia will call primary care physician to get his fasting lipid profile that he is taking high intensity statin in form of Lipitor 40 which I will continue for now. 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  He recovered amazingly well from his procedure and doing very well.   Medication Adjustments/Labs and Tests Ordered: Current medicines are reviewed at length with the patient today.  Concerns regarding medicines are outlined above.  No orders of the defined types were placed in this encounter.  Medication changes: No orders of the defined types were placed in this encounter.   Signed, Park Liter, MD, [facility] 07/12/2019 10:03 AM    [place]

## 2019-12-27 ENCOUNTER — Ambulatory Visit (INDEPENDENT_AMBULATORY_CARE_PROVIDER_SITE_OTHER): Payer: Medicare HMO | Admitting: Cardiology

## 2019-12-27 ENCOUNTER — Encounter: Payer: Self-pay | Admitting: Cardiology

## 2019-12-27 ENCOUNTER — Other Ambulatory Visit: Payer: Self-pay

## 2019-12-27 VITALS — BP 150/78 | HR 89 | Ht 74.0 in | Wt 212.0 lb

## 2019-12-27 DIAGNOSIS — E782 Mixed hyperlipidemia: Secondary | ICD-10-CM | POA: Diagnosis not present

## 2019-12-27 DIAGNOSIS — N184 Chronic kidney disease, stage 4 (severe): Secondary | ICD-10-CM

## 2019-12-27 DIAGNOSIS — R0600 Dyspnea, unspecified: Secondary | ICD-10-CM

## 2019-12-27 DIAGNOSIS — E1121 Type 2 diabetes mellitus with diabetic nephropathy: Secondary | ICD-10-CM | POA: Diagnosis not present

## 2019-12-27 DIAGNOSIS — Z951 Presence of aortocoronary bypass graft: Secondary | ICD-10-CM

## 2019-12-27 NOTE — Patient Instructions (Signed)
Medication Instructions:  Your physician recommends that you continue on your current medications as directed. Please refer to the Current Medication list given to you today.  *If you need a refill on your cardiac medications before your next appointment, please call your pharmacy*  Lab Work: Your physician recommends that you return for lab work in 1-5 days: fasting lipids    If you have labs (blood work) drawn today and your tests are completely normal, you will receive your results only by: Marland Kitchen MyChart Message (if you have MyChart) OR . A paper copy in the mail If you have any lab test that is abnormal or we need to change your treatment, we will call you to review the results.  Testing/Procedures: Your physician has requested that you have an echocardiogram. Echocardiography is a painless test that uses sound waves to create images of your heart. It provides your doctor with information about the size and shape of your heart and how well your heart's chambers and valves are working. This procedure takes approximately one hour. There are no restrictions for this procedure.  A chest x-ray takes a picture of the organs and structures inside the chest, including the heart, lungs, and blood vessels. This test can show several things, including, whether the heart is enlarges; whether fluid is building up in the lungs; and whether pacemaker / defibrillator leads are still in place.  Follow-Up: At [facility], you and your health needs are our priority.  As part of our continuing mission to provide you with exceptional heart care, we have created designated Provider Care Teams.  These Care Teams include your primary Cardiologist (physician) and Advanced Practice Providers (APPs -  Physician Assistants and Nurse Practitioners) who all work together to provide you with the care you need, when you need it.  Your next appointment:   6 week(s)  The format for your next appointment:   In  Person  Provider:   Jenne Campus, MD  Other Instructions   Chest X-Ray A chest X-ray is a painless test that uses radiation to create images of the structures inside of your chest. Chest X-rays are used to look for many health conditions, including heart failure, pneumonia, tuberculosis, rib fractures, breathing disorders, and cancer. They may be used to diagnose chest pain, constant coughing, or trouble breathing. Tell a health care provider about: Any allergies you have. All medicines you are taking, including vitamins, herbs, eye drops, creams, and over-the-counter medicines. Any surgeries you have had. Any medical conditions you have. Whether you are pregnant or may be pregnant. What are the risks? Getting a chest X-ray is a safe procedure. However, you will be exposed to a small amount of radiation. Being exposed to too much radiation over a lifetime can increase the risk of cancer. This risk is small, but it may occur if you have many X-rays throughout your life. What happens before the procedure? You may be asked to remove glasses, jewelry, and any other metal objects. You will be asked to undress from the waist up. You may be given a hospital gown to wear. You may be asked to wear a protective lead apron to protect parts of your body from radiation. What happens during the procedure?  You will be asked to stand still as each picture is taken to get the best possible images. You will be asked to take a deep breath and hold your breath for a few seconds. The X-ray machine will create a picture of your chest  using a tiny burst of radiation. This is painless. More pictures may be taken from other angles. Typically, one picture will be taken while you face the X-ray camera, and another picture will be taken from the side while you stand. If you cannot stand, you may be asked to lie down. The procedure may vary among health care providers and hospitals. What happens after the  procedure? The X-ray(s) will be reviewed by your health care provider or an X-ray (radiology) specialist. It is up to you to get your test results. Ask your health care provider, or the department that is doing the test, when your results will be ready. Your health care provider will tell you if you need more tests or a follow-up exam. Keep all follow-up visits as told by your health care provider. This is important. Summary A chest X-ray is a safe, painless test that is used to examine the inside of the chest, heart, and lungs. You will need to undress from the waist up and remove jewelry and metal objects before the procedure. You will be exposed to a small amount of radiation during the procedure. The X-ray machine will take one or more pictures of your chest while you remain as still as possible. Later, a health care provider or specialist will review the test results with you. This information is not intended to replace advice given to you by your health care provider. Make sure you discuss any questions you have with your health care provider. Document Revised: 03/01/2019 Document Reviewed: 01/05/2017 Elsevier Patient Education  [place].  Echocardiogram An echocardiogram is a procedure that uses painless sound waves (ultrasound) to produce an image of the heart. Images from an echocardiogram can provide important information about:  Signs of coronary artery disease (CAD).  Aneurysm detection. An aneurysm is a weak or damaged part of an artery wall that bulges out from the normal force of blood pumping through the body.  Heart size and shape. Changes in the size or shape of the heart can be associated with certain conditions, including heart failure, aneurysm, and CAD.  Heart muscle function.  Heart valve function.  Signs of a past heart attack.  Fluid buildup around the heart.  Thickening of the heart muscle.  A tumor or infectious growth around the heart  valves. Tell a health care provider about:  Any allergies you have.  All medicines you are taking, including vitamins, herbs, eye drops, creams, and over-the-counter medicines.  Any blood disorders you have.  Any surgeries you have had.  Any medical conditions you have.  Whether you are pregnant or may be pregnant. What are the risks? Generally, this is a safe procedure. However, problems may occur, including:  Allergic reaction to dye (contrast) that may be used during the procedure. What happens before the procedure? No specific preparation is needed. You may eat and drink normally. What happens during the procedure?   An IV tube may be inserted into one of your veins.  You may receive contrast through this tube. A contrast is an injection that improves the quality of the pictures from your heart.  A gel will be applied to your chest.  A wand-like tool (transducer) will be moved over your chest. The gel will help to transmit the sound waves from the transducer.  The sound waves will harmlessly bounce off of your heart to allow the heart images to be captured in real-time motion. The images will be recorded on a computer. The procedure  may vary among health care providers and hospitals. What happens after the procedure?  You may return to your normal, everyday life, including diet, activities, and medicines, unless your health care provider tells you not to do that. Summary  An echocardiogram is a procedure that uses painless sound waves (ultrasound) to produce an image of the heart.  Images from an echocardiogram can provide important information about the size and shape of your heart, heart muscle function, heart valve function, and fluid buildup around your heart.  You do not need to do anything to prepare before this procedure. You may eat and drink normally.  After the echocardiogram is completed, you may return to your normal, everyday life, unless your health care  provider tells you not to do that. This information is not intended to replace advice given to you by your health care provider. Make sure you discuss any questions you have with your health care provider. Document Revised: 03/02/2019 Document Reviewed: 12/12/2016 Elsevier Patient Education  [place].

## 2019-12-27 NOTE — Progress Notes (Signed)
Cardiology Office Note:    Date:  12/27/2019   ID:  DAQUEZ MOWATT, DOB Jul 08, 1951, MRN [id number]  PCP:  Imagene Riches, NP  Cardiologist:  Jenne Campus, MD    Referring MD: Imagene Riches, NP   Chief Complaint  Patient presents with  . Follow-up    5 MO FU     History of Present Illness:    John [name] is a 69 y.o. male   The patient has a history of coronary artery disease with prior PCI to the RCA, type 2 diabetes, hypertension, PAD status post aortofemoral bypass, end-stage IVchronic kidney disease.He was found to have elevated troponin, and therefore cardiac catheterization was completed on 03/27/2019.  Cardiac cath revealed severe multivessel disease to include 90% mid RCA lesion, ostial circumflex to proximal circumflex lesion 80% stenosed, proximal LAD to mid LAD 75% stenosed with markedly positive DFR 0.71, first diagonal was 70% stenosed, proximal RCA to mid RCA was 35% stenosed, distal RCA 1 lesion 40% stenosis was prior stent revealing mild in-stent restenosis, distal RCA to lesion 80% stenosed. The patient was referred to CVTS for surgical consultation for CABG.  The patient underwent a four-vessel CABG on 03/29/2019 with LIMA to LAD, SVG to diagonal, SVG to OM, and SVG to PDA. He had endoscopic harvesting of the greater saphenous vein from his right leg. Postoperatively he had thrombocytopenia on Lovenox and this was discontinued, he was also found to have postoperative CHF and was diuresed aggressively. He was followed by nephrology throughout hospital course. He was discharged on 04/03/2019  Comes today to my office for follow-up.  Interestingly he is telling me that the only since the time of bypass surgery he had gradual progressive worsening of shortness of breath.  He still works he function quite okay but shortness of breath is present and bothersome.  Initially he was told to have some pleural effusion on the left side however no intervention being  done.  Today on the physical examination there is poor air entry in the left base.  Denies have any chest pain, tightness, pressure, burning in the chest.  Past Medical History:  Diagnosis Date  . Diabetes mellitus without complication ([place])   . Hypertension   . MI (myocardial infarction) ([place])   . Renal disorder     Past Surgical History:  Procedure Laterality Date  . ABDOMINAL AORTIC ANEURYSM REPAIR    . APPENDECTOMY    . AV FISTULA PLACEMENT Right 10/18/2018   Procedure: BRACHIO-CEPHALIC ARTERIOVENOUS (AV) FISTULA CREATION;  Surgeon: Angelia Mould, MD;  Location: [facility];  Service: Vascular;  Laterality: Right;  . CARDIAC CATHETERIZATION    . CORONARY ANGIOPLASTY    . CORONARY ARTERY BYPASS GRAFT N/A 03/29/2019   Procedure: CORONARY ARTERY BYPASS GRAFTING (CABG) x 4, using left internal mammary artery and right leg greater saphenous vein harvested endoscopically;  Surgeon: Melrose Nakayama, MD;  Location: [facility];  Service: Open Heart Surgery;  Laterality: N/A;  . CORONARY PRESSURE WIRE/FFR WITH 3D MAPPING N/A 03/27/2019   Procedure: Coronary Pressure Wire/FFR w/3D Mapping;  Surgeon: Jettie Booze, MD;  Location: [facility];  Service: Cardiovascular;  Laterality: N/A;  . IR FLUORO GUIDE CV LINE RIGHT  09/13/2017  . IR REMOVAL TUN CV CATH W/O FL  10/22/2017  . IR US GUIDE VASC ACCESS RIGHT  09/13/2017  . LEFT HEART CATH AND CORONARY ANGIOGRAPHY N/A 03/27/2019   Procedure: LEFT HEART CATH AND CORONARY ANGIOGRAPHY;  Surgeon: Larae Grooms  S, MD;  Location: [facility];  Service: Cardiovascular;  Laterality: N/A;  . NECK SURGERY    . SMALL INTESTINE SURGERY    . TEE WITHOUT CARDIOVERSION N/A 09/10/2017   Procedure: TRANSESOPHAGEAL ECHOCARDIOGRAM (TEE);  Surgeon: Skeet Latch, MD;  Location: [facility];  Service: Cardiovascular;  Laterality: N/A;  . TEE WITHOUT CARDIOVERSION N/A 03/29/2019   Procedure: TRANSESOPHAGEAL ECHOCARDIOGRAM (TEE);  Surgeon:  Melrose Nakayama, MD;  Location: [facility];  Service: Open Heart Surgery;  Laterality: N/A;    Current Medications: No outpatient medications have been marked as taking for the 12/27/19 encounter (Office Visit) with Park Liter, MD.     Allergies:   Penicillins   Social History   Socioeconomic History  . Marital status: Married    Spouse name: Not on file  . Number of children: Not on file  . Years of education: Not on file  . Highest education level: Not on file  Occupational History  . Not on file  Tobacco Use  . Smoking status: Former Research scientist (life sciences)  . Smokeless tobacco: Never Used  Substance and Sexual Activity  . Alcohol use: Yes    Comment: Occasionally.  . Drug use: Never  . Sexual activity: Not on file  Other Topics Concern  . Not on file  Social History Narrative  . Not on file   Social Determinants of Health   Financial Resource Strain:   . Difficulty of Paying Living Expenses: Not on file  Food Insecurity:   . Worried About Charity fundraiser in the Last Year: Not on file  . Ran Out of Food in the Last Year: Not on file  Transportation Needs:   . Lack of Transportation (Medical): Not on file  . Lack of Transportation (Non-Medical): Not on file  Physical Activity:   . Days of Exercise per Week: Not on file  . Minutes of Exercise per Session: Not on file  Stress:   . Feeling of Stress : Not on file  Social Connections:   . Frequency of Communication with Friends and Family: Not on file  . Frequency of Social Gatherings with Friends and Family: Not on file  . Attends Religious Services: Not on file  . Active Member of Clubs or Organizations: Not on file  . Attends Archivist Meetings: Not on file  . Marital Status: Not on file     Family History: The patient's family history includes Cancer in his maternal grandmother. ROS:   Please see the history of present illness.    All 14 point review of systems negative except as described per  history of present illness  EKGs/Labs/Other Studies Reviewed:      Recent Labs: 03/29/2019: ALT 37 03/30/2019: Magnesium 2.2 04/03/2019: Hemoglobin 8.1; Platelets 126 04/04/2019: BUN 65; Creatinine, Ser 5.40; Potassium 4.9; Sodium 136  Recent Lipid Panel    Component Value Date/Time   CHOL 99 03/25/2019 0454   TRIG 80 03/25/2019 0454   HDL 39 (L) 03/25/2019 0454   CHOLHDL 2.5 03/25/2019 0454   VLDL 16 03/25/2019 0454   LDLCALC 44 03/25/2019 0454    Physical Exam:    VS:  Ht 6\' 2"  (1.88 m)   BMI 26.58 kg/m     Wt Readings from Last 3 Encounters:  07/12/19 207 lb (93.9 kg)  05/30/19 208 lb 6.4 oz (94.5 kg)  05/02/19 215 lb 6.4 oz (97.7 kg)     GEN:  Well nourished, well developed in no acute distress HEENT:  Normal NECK: No JVD; No carotid bruits LYMPHATICS: No lymphadenopathy CARDIAC: RRR, no murmurs, no rubs, no gallops RESPIRATORY:  Clear to auscultation without rales, wheezing or rhonchi, poor air entry left base ABDOMEN: Soft, non-tender, non-distended MUSCULOSKELETAL:  No edema; No deformity  SKIN: Warm and dry LOWER EXTREMITIES: no swelling NEUROLOGIC:  Alert and oriented x 3 PSYCHIATRIC:  Normal affect   ASSESSMENT:    1. S/P CABG x 4   2. Mixed hyperlipidemia   3. Chronic kidney disease (CKD), stage IV (severe) ([place])   4. Type 2 diabetes mellitus with diabetic nephropathy, without long-term current use of insulin (HCC)    PLAN:    In order of problems listed above:  1. Status post coronary bypass graft.  Doing well from that point review shortness of breath of course is concerning.  I will ask him to have an echocardiogram to assess left ventricle ejection fraction, chest x-ray will be done as well. 2. Mixed dyslipidemia fasting lipid profile will be done 3. Chronic kidney failure followed by antimedicine team and nephrology 4. Type 2 diabetes stable   Medication Adjustments/Labs and Tests Ordered: Current medicines are reviewed at length with the  patient today.  Concerns regarding medicines are outlined above.  No orders of the defined types were placed in this encounter.  Medication changes: No orders of the defined types were placed in this encounter.   Signed, Park Liter, MD, [facility] 12/27/2019 11:25 AM    [place]

## 2020-01-02 ENCOUNTER — Ambulatory Visit (HOSPITAL_BASED_OUTPATIENT_CLINIC_OR_DEPARTMENT_OTHER)
Admission: RE | Admit: 2020-01-02 | Discharge: 2020-01-02 | Disposition: A | Discharge status: Home / Self Care | Payer: Medicare HMO | Source: Ambulatory Visit | Attending: Cardiology | Admitting: Cardiology

## 2020-01-02 ENCOUNTER — Other Ambulatory Visit: Payer: Self-pay

## 2020-01-02 DIAGNOSIS — R0600 Dyspnea, unspecified: Secondary | ICD-10-CM | POA: Insufficient documentation

## 2020-01-02 NOTE — Progress Notes (Signed)
  Echocardiogram 2D Echocardiogram has been performed.  Cardell Peach 01/02/2020, 9:38 AM

## 2020-01-03 LAB — LIPID PANEL
Chol/HDL Ratio: 1.7 ratio (ref 0.0–5.0)
Cholesterol, Total: 86 mg/dL — ABNORMAL LOW (ref 100–199)
HDL: 50 mg/dL (ref >39)
LDL Chol Calc (NIH): 24 mg/dL (ref 0–99)
Triglycerides: 41 mg/dL (ref 0–149)
VLDL Cholesterol Cal: 12 mg/dL (ref 5–40)

## 2020-01-04 ENCOUNTER — Telehealth: Payer: Self-pay | Admitting: Emergency Medicine

## 2020-01-04 DIAGNOSIS — I38 Endocarditis, valve unspecified: Secondary | ICD-10-CM

## 2020-01-04 DIAGNOSIS — J90 Pleural effusion, not elsewhere classified: Secondary | ICD-10-CM

## 2020-01-04 NOTE — Telephone Encounter (Signed)
Left message for patient to return call. I already informed him of echo results and chest xray results. I need him to tell him where to get lab work since it is a littler different than usual with blood cultures. Will continue efforts. He was aware that a pulmonology referral as placed.

## 2020-01-04 NOTE — Telephone Encounter (Signed)
am

## 2020-01-08 NOTE — Telephone Encounter (Signed)
Patient returning call.

## 2020-01-08 NOTE — Telephone Encounter (Signed)
Spoke with pt, aware he will need to go to a [facility] center, we can not do those in our office.

## 2020-01-09 ENCOUNTER — Ambulatory Visit: Payer: Medicare HMO | Admitting: Pulmonary Disease

## 2020-01-09 ENCOUNTER — Other Ambulatory Visit: Payer: Self-pay

## 2020-01-09 ENCOUNTER — Ambulatory Visit (INDEPENDENT_AMBULATORY_CARE_PROVIDER_SITE_OTHER): Payer: Medicare HMO

## 2020-01-09 ENCOUNTER — Encounter: Payer: Self-pay | Admitting: Pulmonary Disease

## 2020-01-09 ENCOUNTER — Encounter: Payer: Self-pay | Admitting: Cardiology

## 2020-01-09 ENCOUNTER — Ambulatory Visit (INDEPENDENT_AMBULATORY_CARE_PROVIDER_SITE_OTHER): Payer: Medicare HMO | Admitting: Cardiology

## 2020-01-09 ENCOUNTER — Other Ambulatory Visit (HOSPITAL_COMMUNITY)
Admission: RE | Admit: 2020-01-09 | Discharge: 2020-01-09 | Disposition: A | Discharge status: Home / Self Care | Payer: Medicare HMO | Source: Ambulatory Visit | Attending: Pulmonary Disease | Admitting: Pulmonary Disease

## 2020-01-09 ENCOUNTER — Other Ambulatory Visit (HOSPITAL_COMMUNITY)
Admission: RE | Admit: 2020-01-09 | Discharge: 2020-01-09 | Disposition: A | Discharge status: Home / Self Care | Payer: Medicare HMO | Source: Ambulatory Visit | Attending: Cardiology | Admitting: Cardiology

## 2020-01-09 VITALS — BP 130/70 | HR 92 | Temp 97.2°F | Ht 72.25 in | Wt 212.0 lb

## 2020-01-09 VITALS — BP 128/70 | HR 90 | Ht 74.0 in | Wt 211.4 lb

## 2020-01-09 DIAGNOSIS — J90 Pleural effusion, not elsewhere classified: Secondary | ICD-10-CM

## 2020-01-09 DIAGNOSIS — I429 Cardiomyopathy, unspecified: Secondary | ICD-10-CM | POA: Insufficient documentation

## 2020-01-09 DIAGNOSIS — Z01812 Encounter for preprocedural laboratory examination: Secondary | ICD-10-CM | POA: Diagnosis present

## 2020-01-09 DIAGNOSIS — I255 Ischemic cardiomyopathy: Secondary | ICD-10-CM

## 2020-01-09 DIAGNOSIS — Z20822 Contact with and (suspected) exposure to covid-19: Secondary | ICD-10-CM | POA: Diagnosis not present

## 2020-01-09 DIAGNOSIS — Z951 Presence of aortocoronary bypass graft: Secondary | ICD-10-CM | POA: Diagnosis not present

## 2020-01-09 DIAGNOSIS — Z8679 Personal history of other diseases of the circulatory system: Secondary | ICD-10-CM

## 2020-01-09 DIAGNOSIS — I214 Non-ST elevation (NSTEMI) myocardial infarction: Secondary | ICD-10-CM

## 2020-01-09 DIAGNOSIS — N184 Chronic kidney disease, stage 4 (severe): Secondary | ICD-10-CM

## 2020-01-09 DIAGNOSIS — I10 Essential (primary) hypertension: Secondary | ICD-10-CM

## 2020-01-09 HISTORY — DX: Personal history of other diseases of the circulatory system: Z86.79

## 2020-01-09 HISTORY — DX: Non-ST elevation (NSTEMI) myocardial infarction: I21.4

## 2020-01-09 LAB — CBC WITH DIFFERENTIAL/PLATELET
Basophils Absolute: 0 10*3/uL (ref 0.0–0.2)
Basos: 1 %
EOS (ABSOLUTE): 0.2 10*3/uL (ref 0.0–0.4)
Eos: 2 %
Hematocrit: 32 % — ABNORMAL LOW (ref 37.5–51.0)
Hemoglobin: 10.2 g/dL — ABNORMAL LOW (ref 13.0–17.7)
Immature Grans (Abs): 0 10*3/uL (ref 0.0–0.1)
Immature Granulocytes: 0 %
Lymphocytes Absolute: 1.3 10*3/uL (ref 0.7–3.1)
Lymphs: 16 %
MCH: 27 pg (ref 26.6–33.0)
MCHC: 31.9 g/dL (ref 31.5–35.7)
MCV: 85 fL (ref 79–97)
Monocytes Absolute: 0.8 10*3/uL (ref 0.1–0.9)
Monocytes: 10 %
Neutrophils Absolute: 5.6 10*3/uL (ref 1.4–7.0)
Neutrophils: 71 %
Platelets: 227 10*3/uL (ref 150–450)
RBC: 3.78 x10E6/uL — ABNORMAL LOW (ref 4.14–5.80)
RDW: 14.3 % (ref 11.6–15.4)
WBC: 7.9 10*3/uL (ref 3.4–10.8)

## 2020-01-09 LAB — BASIC METABOLIC PANEL
BUN/Creatinine Ratio: 11 (ref 10–24)
BUN: 48 mg/dL — ABNORMAL HIGH (ref 8–27)
CO2: 18 mmol/L — ABNORMAL LOW (ref 20–29)
Calcium: 9.7 mg/dL (ref 8.6–10.2)
Chloride: 107 mmol/L — ABNORMAL HIGH (ref 96–106)
Creatinine, Ser: 4.51 mg/dL — ABNORMAL HIGH (ref 0.76–1.27)
GFR calc Af Amer: 15 mL/min/{1.73_m2} — ABNORMAL LOW (ref >59)
GFR calc non Af Amer: 13 mL/min/{1.73_m2} — ABNORMAL LOW (ref >59)
Glucose: 105 mg/dL — ABNORMAL HIGH (ref 65–99)
Potassium: 5.2 mmol/L (ref 3.5–5.2)
Sodium: 140 mmol/L (ref 134–144)

## 2020-01-09 LAB — BODY FLUID CELL COUNT WITH DIFFERENTIAL
Eos, Fluid: 2 %
Lymphs, Fluid: 57 %
Monocyte-Macrophage-Serous Fluid: 19 % — ABNORMAL LOW (ref 50–90)
Neutrophil Count, Fluid: 22 % (ref 0–25)
Total Nucleated Cell Count, Fluid: 400 cu mm (ref 0–1000)

## 2020-01-09 LAB — SEDIMENTATION RATE: Sed Rate: 54 mm/hr — ABNORMAL HIGH (ref 0–30)

## 2020-01-09 LAB — SARS CORONAVIRUS 2 (TAT 6-24 HRS): SARS Coronavirus 2: NEGATIVE

## 2020-01-09 MED ORDER — HYDRALAZINE HCL 10 MG PO TABS: 10.0000 mg | ORAL_TABLET | Freq: Three times a day (TID) | ORAL | 1 refills | Status: DC

## 2020-01-09 NOTE — Progress Notes (Signed)
Synopsis: Effusion  Assessment & Plan:  Problem 1 Post CABG pleural effusion: present since May 2020 - Loculated and had pain with drainage today suggesting entrapment physiology from chronicity - Doubt infected but will send for usual studies - Held off on steroids and abx but if still feeling lousy next week can give this a shot - Told him to do tele-visit in 1 week but change to in person if more SOB  Problem 2 Fatigue, abnormal echo, hx of potential endocarditis tx with prolonged abx: management per cardiology, to get TEE later this week if weather cooperates   . I reviewed prior external note(s) from Dr. Agustin Cree on 01/09/20 . I reviewed the result(s) of ESR and CBC 01/08/20 . I have ordered CXR, pleural fluid studies  Review of patient's CXR images revealed left pleural effusion with some signs of loculation, present since CABG but larger. The patient's images have been independently reviewed by me.       End of visit medications:  Current Outpatient Medications:  .  acetaminophen (TYLENOL) 500 MG tablet, Take 1,500 mg by mouth 2 (two) times daily as needed (pain)., Disp: , Rfl:  .  amLODipine (NORVASC) 5 MG tablet, Take 5 mg by mouth daily., Disp: , Rfl: 12 .  ascorbic acid (VITAMIN C) 500 MG tablet, Take 500 mg by mouth daily., Disp: , Rfl:  .  aspirin EC 81 MG tablet, Take 81 mg by mouth daily., Disp: , Rfl:  .  atorvastatin (LIPITOR) 40 MG tablet, Take 40 mg by mouth daily in the afternoon. , Disp: , Rfl:  .  calcitRIOL (ROCALTROL) 0.5 MCG capsule, Take 0.5 mcg by mouth daily., Disp: , Rfl: 3 .  Dulaglutide (TRULICITY) 1.5 ZO/1.0RU SOPN, Inject 1.5 mg into the skin every Sunday., Disp: , Rfl:  .  ferrous sulfate 325 (65 FE) MG tablet, Take 325 mg by mouth 2 (two) times daily with a meal., Disp: , Rfl:  .  fluticasone (FLONASE) 50 MCG/ACT nasal spray, Place 1 spray into both nostrils daily as needed for allergies or rhinitis., Disp: , Rfl:  .  gabapentin (NEURONTIN) 100 MG  capsule, Take 2 capsules (200 mg total) by mouth at bedtime., Disp: , Rfl:  .  glipiZIDE (GLUCOTROL XL) 2.5 MG 24 hr tablet, Take 2.5 mg by mouth 2 (two) times daily., Disp: , Rfl:  .  hydrALAZINE (APRESOLINE) 10 MG tablet, Take 1 tablet (10 mg total) by mouth 3 (three) times daily., Disp: 90 tablet, Rfl: 1 .  linagliptin (TRADJENTA) 5 MG TABS tablet, Take 5 mg by mouth daily., Disp: , Rfl:  .  metoprolol succinate (TOPROL-XL) 50 MG 24 hr tablet, Take 50 mg by mouth daily., Disp: , Rfl: 0 .  OVER THE COUNTER MEDICATION, Place 1 drop into both eyes 2 (two) times daily as needed (dry eyes). Hyper Tears eye drops, Disp: , Rfl:  .  rOPINIRole (REQUIP) 0.25 MG tablet, Take 0.25 mg by mouth at bedtime., Disp: , Rfl:  .  sevelamer carbonate (RENVELA) 800 MG tablet, Take 1 tablet (800 mg total) by mouth 3 (three) times daily with meals., Disp: 90 tablet, Rfl: 1 .  tamsulosin (FLOMAX) 0.4 MG CAPS capsule, Take 0.4 mg by mouth daily before supper., Disp: , Rfl: 2 .  zolpidem (AMBIEN) 10 MG tablet, Take 10 mg by mouth at bedtime., Disp: , Rfl:    Candee Furbish, MD [facility] 01/09/2020 3:07 PM    Subjective:   PATIENT ID: John [name] GENDER: male DOB: 1951/08/08, MRN: [id number]  Chief Complaint  Patient presents with  . Consult    xray 01/02/20 showed fluid    HPI -Here for evaluation of pleural effusion -Worsening dyspnea and cough over past several weeks -CXR showing left effusion, present since CABG 03/29/2019 but enlarged -Pulmonology consulted for potential tap -Patient denies any recent trauma -No fevers or infectious symptoms -Only symptoms are fatigue, DOE, and cough on lying flat, nonproductive -Echo with aortic vegetation, blood cultures drawn, no white count, does have elevated ESR, abx on hold -Comorbidities include CKD nearing need for HD, prior aortic surgery  Ancillary information including prior medications, full medical/surgical/family/social  histoies, and PFTs (when available) are listed below and have been reviewed.   ROS + symptoms in bold Fevers, chills, weight loss Nausea, vomiting, diarrhea Shortness of breath, wheezing, cough Chest pain, palpitations, lower ext edema   Objective:   Vitals:   01/09/20 1429  BP: 130/70  Pulse: 92  Temp: (!) 97.2 F (36.2 C)  TempSrc: Temporal  SpO2: 95%  Weight: 212 lb (96.2 kg)  Height: 6' 0.25" (1.835 m)   95% on RA BMI Readings from Last 3 Encounters:  01/09/20 28.55 kg/m  01/09/20 27.14 kg/m  12/27/19 27.22 kg/m   Wt Readings from Last 3 Encounters:  01/09/20 212 lb (96.2 kg)  01/09/20 211 lb 6.4 oz (95.9 kg)  12/27/19 212 lb (96.2 kg)    GEN: 69 year old man in no acute distress HEENT: trachea midline, mucus membranes moist CV: Regular rate and rhythm, extremities are warm PULM: Diminished L base, effusion on [place], no obvious septations GI: Soft, +BS EXT: No edema, + muscle wasting NEURO: Moves all 4 extremities Skin: large scar from manubrium to below umbillicus, RUE fistula with good thrill   Ancillary Information    Past Medical History:  Diagnosis Date  . Diabetes mellitus without complication ([place])   . Hypertension   . MI (myocardial infarction) ([place])   . Renal disorder      Family History  Problem Relation Age of Onset  . Cancer Maternal Grandmother      Past Surgical History:  Procedure Laterality Date  . ABDOMINAL AORTIC ANEURYSM REPAIR    . APPENDECTOMY    . AV FISTULA PLACEMENT Right 10/18/2018   Procedure: BRACHIO-CEPHALIC ARTERIOVENOUS (AV) FISTULA CREATION;  Surgeon: Angelia Mould, MD;  Location: [facility];  Service: Vascular;  Laterality: Right;  . CARDIAC CATHETERIZATION    . CORONARY ANGIOPLASTY    . CORONARY ARTERY BYPASS GRAFT N/A 03/29/2019   Procedure: CORONARY ARTERY BYPASS GRAFTING (CABG) x 4, using left internal mammary artery and right leg greater saphenous vein harvested endoscopically;  Surgeon: Melrose Nakayama, MD;  Location: [facility];  Service: Open Heart Surgery;  Laterality: N/A;  . CORONARY PRESSURE WIRE/FFR WITH 3D MAPPING N/A 03/27/2019   Procedure: Coronary Pressure Wire/FFR w/3D Mapping;  Surgeon: Jettie Booze, MD;  Location: [facility];  Service: Cardiovascular;  Laterality: N/A;  . IR FLUORO GUIDE CV LINE RIGHT  09/13/2017  . IR REMOVAL TUN CV CATH W/O FL  10/22/2017  . IR US GUIDE VASC ACCESS RIGHT  09/13/2017  . LEFT HEART CATH AND CORONARY ANGIOGRAPHY N/A 03/27/2019   Procedure: LEFT HEART CATH AND CORONARY ANGIOGRAPHY;  Surgeon: Jettie Booze, MD;  Location: [facility];  Service: Cardiovascular;  Laterality: N/A;  . NECK SURGERY    . SMALL INTESTINE SURGERY    . TEE WITHOUT  CARDIOVERSION N/A 09/10/2017   Procedure: TRANSESOPHAGEAL ECHOCARDIOGRAM (TEE);  Surgeon: Skeet Latch, MD;  Location: [facility];  Service: Cardiovascular;  Laterality: N/A;  . TEE WITHOUT CARDIOVERSION N/A 03/29/2019   Procedure: TRANSESOPHAGEAL ECHOCARDIOGRAM (TEE);  Surgeon: Melrose Nakayama, MD;  Location: [facility];  Service: Open Heart Surgery;  Laterality: N/A;    Social History   Socioeconomic History  . Marital status: Married    Spouse name: Not on file  . Number of children: Not on file  . Years of education: Not on file  . Highest education level: Not on file  Occupational History  . Not on file  Tobacco Use  . Smoking status: Former Smoker    Packs/day: 2.00    Years: 30.00    Pack years: 60.00    Types: Cigarettes    Start date: 1968    Quit date: 1998    Years since quitting: 23.1  . Smokeless tobacco: Never Used  Substance and Sexual Activity  . Alcohol use: Yes    Comment: Occasionally.  . Drug use: Never  . Sexual activity: Not on file  Other Topics Concern  . Not on file  Social History Narrative  . Not on file   Social Determinants of Health   Financial Resource Strain:   . Difficulty of Paying Living Expenses: Not on file  Food  Insecurity:   . Worried About Charity fundraiser in the Last Year: Not on file  . Ran Out of Food in the Last Year: Not on file  Transportation Needs:   . Lack of Transportation (Medical): Not on file  . Lack of Transportation (Non-Medical): Not on file  Physical Activity:   . Days of Exercise per Week: Not on file  . Minutes of Exercise per Session: Not on file  Stress:   . Feeling of Stress : Not on file  Social Connections:   . Frequency of Communication with Friends and Family: Not on file  . Frequency of Social Gatherings with Friends and Family: Not on file  . Attends Religious Services: Not on file  . Active Member of Clubs or Organizations: Not on file  . Attends Archivist Meetings: Not on file  . Marital Status: Not on file  Intimate Partner Violence:   . Fear of Current or Ex-Partner: Not on file  . Emotionally Abused: Not on file  . Physically Abused: Not on file  . Sexually Abused: Not on file     Allergies  Allergen Reactions  . Penicillins Rash and Other (See Comments)    Did it involve swelling of the face/tongue/throat, SOB, or low BP? No Did it involve sudden or severe rash/hives, skin peeling, or any reaction on the inside of your mouth or nose? Yes Did you need to seek medical attention at a hospital or doctor's office? Yes When did it last happen?Childhood allergy If all above answers are "NO", may proceed with cephalosporin use.      CBC    Component Value Date/Time   WBC 7.9 01/08/2020 1630   WBC 7.9 04/03/2019 0248   RBC 3.78 (L) 01/08/2020 1630   RBC 2.69 (L) 04/03/2019 0248   HGB 10.2 (L) 01/08/2020 1630   HCT 32.0 (L) 01/08/2020 1630   PLT 227 01/08/2020 1630   MCV 85 01/08/2020 1630   MCH 27.0 01/08/2020 1630   MCH 30.1 04/03/2019 0248   MCHC 31.9 01/08/2020 1630   MCHC 32.5 04/03/2019 0248   RDW 14.3 01/08/2020 1630  LYMPHSABS 1.3 01/08/2020 1630   MONOABS 1.1 (H) 09/13/2017 0744   EOSABS 0.2 01/08/2020 1630    BASOSABS 0.0 01/08/2020 1630    Pulmonary Functions Testing Results: No flowsheet data found.  Outpatient Medications Prior to Visit  Medication Sig Dispense Refill  . acetaminophen (TYLENOL) 500 MG tablet Take 1,500 mg by mouth 2 (two) times daily as needed (pain).    Marland Kitchen amLODipine (NORVASC) 5 MG tablet Take 5 mg by mouth daily.  12  . ascorbic acid (VITAMIN C) 500 MG tablet Take 500 mg by mouth daily.    Marland Kitchen aspirin EC 81 MG tablet Take 81 mg by mouth daily.    Marland Kitchen atorvastatin (LIPITOR) 40 MG tablet Take 40 mg by mouth daily in the afternoon.     . calcitRIOL (ROCALTROL) 0.5 MCG capsule Take 0.5 mcg by mouth daily.  3  . Dulaglutide (TRULICITY) 1.5 SN/0.5LZ SOPN Inject 1.5 mg into the skin every Sunday.    . ferrous sulfate 325 (65 FE) MG tablet Take 325 mg by mouth 2 (two) times daily with a meal.    . fluticasone (FLONASE) 50 MCG/ACT nasal spray Place 1 spray into both nostrils daily as needed for allergies or rhinitis.    Marland Kitchen gabapentin (NEURONTIN) 100 MG capsule Take 2 capsules (200 mg total) by mouth at bedtime.    Marland Kitchen glipiZIDE (GLUCOTROL XL) 2.5 MG 24 hr tablet Take 2.5 mg by mouth 2 (two) times daily.    . hydrALAZINE (APRESOLINE) 10 MG tablet Take 1 tablet (10 mg total) by mouth 3 (three) times daily. 90 tablet 1  . linagliptin (TRADJENTA) 5 MG TABS tablet Take 5 mg by mouth daily.    . metoprolol succinate (TOPROL-XL) 50 MG 24 hr tablet Take 50 mg by mouth daily.  0  . OVER THE COUNTER MEDICATION Place 1 drop into both eyes 2 (two) times daily as needed (dry eyes). Hyper Tears eye drops    . rOPINIRole (REQUIP) 0.25 MG tablet Take 0.25 mg by mouth at bedtime.    . sevelamer carbonate (RENVELA) 800 MG tablet Take 1 tablet (800 mg total) by mouth 3 (three) times daily with meals. 90 tablet 1  . tamsulosin (FLOMAX) 0.4 MG CAPS capsule Take 0.4 mg by mouth daily before supper.  2  . zolpidem (AMBIEN) 10 MG tablet Take 10 mg by mouth at bedtime.    . patiromer (VELTASSA) 8.4 g packet Take 1  packet (8.4 g total) by mouth daily at 3 pm. (Patient not taking: Reported on 01/09/2020) 30 packet 0   No facility-administered medications prior to visit.

## 2020-01-09 NOTE — Patient Instructions (Signed)
Tele visit in 1 week If more SOB, come in for visit Call me if chest pain does not go away in day or so

## 2020-01-09 NOTE — Progress Notes (Signed)
Cardiology Office Note:    Date:  01/09/2020   ID:  John [name], DOB 03/14/1951, MRN [id number]  PCP:  Imagene Riches, NP  Cardiologist:  Jenne Campus, MD    Referring MD: Imagene Riches, NP   No chief complaint on file. Still feeling weak and tired  History of Present Illness:    John [name] is a 69 y.o. male   The patient has a history of coronary artery disease with prior PCI to the RCA, type 2 diabetes, hypertension, PAD status post aortofemoral bypass, end-stage IVchronic kidney disease.He was found to have elevated troponin, and therefore cardiac catheterization was completed on 03/27/2019.  Cardiac cath revealed severe multivessel disease to include 90% mid RCA lesion, ostial circumflex to proximal circumflex lesion 80% stenosed, proximal LAD to mid LAD 75% stenosed with markedly positive DFR 0.71, first diagonal was 70% stenosed, proximal RCA to mid RCA was 35% stenosed, distal RCA 1 lesion 40% stenosis was prior stent revealing mild in-stent restenosis, distal RCA to lesion 80% stenosed. The patient was referred to CVTS for surgical consultation for CABG.  The patient underwent a four-vessel CABG on 03/29/2019 with LIMA to LAD, SVG to diagonal, SVG to OM, and SVG to PDA. He had endoscopic harvesting of the greater saphenous vein from his right leg. Postoperatively he had thrombocytopenia on Lovenox and this was discontinued, he was also found to have postoperative CHF and was diuresed aggressively. He was followed by nephrology throughout hospital course. He was discharged on 04/03/2019  As a part of evaluation he had a chest x-ray done which showed some loculated large fluid in the left pleura, also echocardiogram being done under some suspicion for endocarditis of the aortic valve.  He comes today to my office to talk about dose issues. Overall he complained of being weak tired exhausted.  He denies have any fever no chills.  He does have some exertional  shortness of breath but no swelling of lower extremities he said he does not feel right.  He also got history of endocarditis which was diagnosed in 2019.  He came with fever of unknown origin quite extensive evaluation was done some suspicious structure was seen on both aortic as well as mitral valve and then he was treated for endocarditis.  He tells me that at that time he feels very sick.  Now he just feels weak and tired nothing as compared to his sickness before.  Past Medical History:  Diagnosis Date  . Diabetes mellitus without complication ([place])   . Hypertension   . MI (myocardial infarction) ([place])   . Renal disorder     Past Surgical History:  Procedure Laterality Date  . ABDOMINAL AORTIC ANEURYSM REPAIR    . APPENDECTOMY    . AV FISTULA PLACEMENT Right 10/18/2018   Procedure: BRACHIO-CEPHALIC ARTERIOVENOUS (AV) FISTULA CREATION;  Surgeon: Angelia Mould, MD;  Location: [facility];  Service: Vascular;  Laterality: Right;  . CARDIAC CATHETERIZATION    . CORONARY ANGIOPLASTY    . CORONARY ARTERY BYPASS GRAFT N/A 03/29/2019   Procedure: CORONARY ARTERY BYPASS GRAFTING (CABG) x 4, using left internal mammary artery and right leg greater saphenous vein harvested endoscopically;  Surgeon: Melrose Nakayama, MD;  Location: [facility];  Service: Open Heart Surgery;  Laterality: N/A;  . CORONARY PRESSURE WIRE/FFR WITH 3D MAPPING N/A 03/27/2019   Procedure: Coronary Pressure Wire/FFR w/3D Mapping;  Surgeon: Jettie Booze, MD;  Location: [facility];  Service: Cardiovascular;  Laterality:  N/A;  . IR FLUORO GUIDE CV LINE RIGHT  09/13/2017  . IR REMOVAL TUN CV CATH W/O FL  10/22/2017  . IR US GUIDE VASC ACCESS RIGHT  09/13/2017  . LEFT HEART CATH AND CORONARY ANGIOGRAPHY N/A 03/27/2019   Procedure: LEFT HEART CATH AND CORONARY ANGIOGRAPHY;  Surgeon: Jettie Booze, MD;  Location: [facility];  Service: Cardiovascular;  Laterality: N/A;  . NECK SURGERY    . SMALL  INTESTINE SURGERY    . TEE WITHOUT CARDIOVERSION N/A 09/10/2017   Procedure: TRANSESOPHAGEAL ECHOCARDIOGRAM (TEE);  Surgeon: Skeet Latch, MD;  Location: [facility];  Service: Cardiovascular;  Laterality: N/A;  . TEE WITHOUT CARDIOVERSION N/A 03/29/2019   Procedure: TRANSESOPHAGEAL ECHOCARDIOGRAM (TEE);  Surgeon: Melrose Nakayama, MD;  Location: [facility];  Service: Open Heart Surgery;  Laterality: N/A;    Current Medications: Current Meds  Medication Sig  . acetaminophen (TYLENOL) 500 MG tablet Take 1,500 mg by mouth 2 (two) times daily as needed (pain).  Marland Kitchen amLODipine (NORVASC) 5 MG tablet Take 5 mg by mouth daily.  Marland Kitchen aspirin EC 81 MG tablet Take 81 mg by mouth daily.  Marland Kitchen atorvastatin (LIPITOR) 40 MG tablet Take 40 mg by mouth daily.  . calcitRIOL (ROCALTROL) 0.5 MCG capsule Take 0.5 mcg by mouth daily.  . Dulaglutide (TRULICITY) 1.5 0000000 SOPN Inject 1.5 mg into the skin every Sunday.  . ferrous sulfate 325 (65 FE) MG tablet Take 325 mg by mouth 2 (two) times daily with a meal.  . gabapentin (NEURONTIN) 100 MG capsule Take 2 capsules (200 mg total) by mouth at bedtime.  Marland Kitchen glipiZIDE (GLUCOTROL XL) 2.5 MG 24 hr tablet Take 2.5 mg by mouth 2 (two) times daily.  Marland Kitchen linagliptin (TRADJENTA) 5 MG TABS tablet Take 5 mg by mouth daily.  . metoprolol succinate (TOPROL-XL) 50 MG 24 hr tablet Take 50 mg by mouth daily.  Marland Kitchen OVER THE COUNTER MEDICATION Place 1 drop into both eyes 2 (two) times daily as needed (dry eyes). Hyper Tears eye drops  . patiromer (VELTASSA) 8.4 g packet Take 1 packet (8.4 g total) by mouth daily at 3 pm.  . rOPINIRole (REQUIP) 0.25 MG tablet Take 0.25 mg by mouth at bedtime.  . sevelamer carbonate (RENVELA) 800 MG tablet Take 1 tablet (800 mg total) by mouth 3 (three) times daily with meals.  . tamsulosin (FLOMAX) 0.4 MG CAPS capsule Take 0.4 mg by mouth daily before supper.  . zolpidem (AMBIEN) 5 MG tablet Take 10 mg by mouth at bedtime.     Allergies:   Penicillins     Social History   Socioeconomic History  . Marital status: Married    Spouse name: Not on file  . Number of children: Not on file  . Years of education: Not on file  . Highest education level: Not on file  Occupational History  . Not on file  Tobacco Use  . Smoking status: Former Research scientist (life sciences)  . Smokeless tobacco: Never Used  Substance and Sexual Activity  . Alcohol use: Yes    Comment: Occasionally.  . Drug use: Never  . Sexual activity: Not on file  Other Topics Concern  . Not on file  Social History Narrative  . Not on file   Social Determinants of Health   Financial Resource Strain:   . Difficulty of Paying Living Expenses: Not on file  Food Insecurity:   . Worried About Charity fundraiser in the Last Year: Not on file  .  Ran Out of Food in the Last Year: Not on file  Transportation Needs:   . Lack of Transportation (Medical): Not on file  . Lack of Transportation (Non-Medical): Not on file  Physical Activity:   . Days of Exercise per Week: Not on file  . Minutes of Exercise per Session: Not on file  Stress:   . Feeling of Stress : Not on file  Social Connections:   . Frequency of Communication with Friends and Family: Not on file  . Frequency of Social Gatherings with Friends and Family: Not on file  . Attends Religious Services: Not on file  . Active Member of Clubs or Organizations: Not on file  . Attends Archivist Meetings: Not on file  . Marital Status: Not on file     Family History: The patient's family history includes Cancer in his maternal grandmother. ROS:   Please see the history of present illness.    All 14 point review of systems negative except as described per history of present illness  EKGs/Labs/Other Studies Reviewed:    09/10/18 TEE : EF 55-60%. "Aortic valve: There was a 1.0 cm (L) x 0.2 cm (W), strand-like echodensity intermittently seen in the LVOT. It is unclear if it is attached to the aortic valve. Mitral Valve Very  small 0.6 cm (L) x 0.2 cm (W) filamentous hypodensity intermittently seen on the atrial side of the anterior leaflet.  Impression: Very small, strand-like echodensities were noted on the mitral and aortic valves. Cannot rule out early endocarditis. No clear vegetations were noted   01/02/2020 transthoracic echocardiogram: 1. Left ventricular ejection fraction, by estimation, is 40 to 45%. The  left ventricle has moderately decreased function. The left ventrical  demonstrates global hypokinesis. There is moderately increased left  ventricular hypertrophy. Left ventricular  diastolic parameters are consistent with Grade II diastolic dysfunction  (pseudonormalization). Elevated left ventricular pressure. The average  left ventricular global longitudinal strain is -11.8 %.  2. Right ventricular systolic function is normal. The right ventricular  size is mildly enlarged. There is moderately elevated pulmonary artery  systolic pressure.  3. Moderate mitral valve regurgitation.  4. Aortic valve vegetation is visualized on the noncoronary cusp.  5. The aortic valve is abnormal. Aortic valve regurgitation is not  visualized. No aortic stenosis is present.  6. There is moderate dilatation of the ascending aorta measuring 43 mm.  7. The inferior vena cava is dilated in size with >50% respiratory  variability, suggesting right atrial pressure of 8 mmHg.  Recent Labs: 03/29/2019: ALT 37 03/30/2019: Magnesium 2.2 04/04/2019: BUN 65; Creatinine, Ser 5.40; Potassium 4.9; Sodium 136 01/08/2020: Hemoglobin 10.2; Platelets 227  Recent Lipid Panel    Component Value Date/Time   CHOL 86 (L) 01/02/2020 0952   TRIG 41 01/02/2020 0952   HDL 50 01/02/2020 0952   CHOLHDL 1.7 01/02/2020 0952   CHOLHDL 2.5 03/25/2019 0454   VLDL 16 03/25/2019 0454   LDLCALC 24 01/02/2020 0952    Physical Exam:    VS:  BP 128/70   Pulse 90   Ht 6\' 2"  (1.88 m)   Wt 211 lb 6.4 oz (95.9 kg)   SpO2 96%   BMI 27.14  kg/m     Wt Readings from Last 3 Encounters:  01/09/20 211 lb 6.4 oz (95.9 kg)  12/27/19 212 lb (96.2 kg)  07/12/19 207 lb (93.9 kg)     GEN:  Well nourished, well developed in no acute distress HEENT: Normal NECK: No JVD;  No carotid bruits LYMPHATICS: No lymphadenopathy CARDIAC: RRR, no murmurs, no rubs, no gallops RESPIRATORY:  Clear to auscultation without rales, wheezing or rhonchi poor air entry to the left base ABDOMEN: Soft, non-tender, non-distended MUSCULOSKELETAL:  No edema; No deformity  SKIN: Warm and dry LOWER EXTREMITIES: no swelling NEUROLOGIC:  Alert and oriented x 3 PSYCHIATRIC:  Normal affect   ASSESSMENT:    1. S/P CABG x 4   2. History of endocarditis   3. Pleural effusion on left   4. Ischemic cardiomyopathy   5. Essential hypertension   6. Chronic kidney disease (CKD), stage IV (severe) ([facility])    PLAN:    In order of problems listed above:  1. Status post coronary artery bypass graft wound healed completely.  Doing well from that point review 2. History of endocarditis in 2019 and now he does have some lesion on the aortic valve looks like a possible vegetation.  I will schedule him to have transesophageal echocardiogram again to look at the lesion both on the aortic and mitral valve.  Likely he does not have white blood cell count elevation, also does not have fever, he does have however high sedimentation rate.  We waiting for blood cultures.  I do not think we have enough indication for active process to start antibiotic at this stage. 3. Pleural effusion in the left side.  We getting him with pulmonary team so he can be taking care of.  He may require drainage. 4. Essential hypertension blood pressure controlled continue present management. 5. Chronic kidney failure.  Noted followed by nephrology. 6. Cardiomyopathy with diminished ejection fraction I will add hydralazine 10 mg 3 times daily to his medical regimen.  I will see him back within a month to  continue augmenting his therapy.   Medication Adjustments/Labs and Tests Ordered: Current medicines are reviewed at length with the patient today.  Concerns regarding medicines are outlined above.  No orders of the defined types were placed in this encounter.  Medication changes: No orders of the defined types were placed in this encounter.   Signed, Park Liter, MD, [facility] 01/09/2020 9:23 AM    [place]

## 2020-01-09 NOTE — H&P (View-Only) (Signed)
Cardiology Office Note:    Date:  01/09/2020   ID:  John [name], DOB 1951/07/16, MRN [id number]  PCP:  Imagene Riches, NP  Cardiologist:  Jenne Campus, MD    Referring MD: Imagene Riches, NP   No chief complaint on file. Still feeling weak and tired  History of Present Illness:    John [name] is a 69 y.o. male   The patient has a history of coronary artery disease with prior PCI to the RCA, type 2 diabetes, hypertension, PAD status post aortofemoral bypass, end-stage IVchronic kidney disease.He was found to have elevated troponin, and therefore cardiac catheterization was completed on 03/27/2019.  Cardiac cath revealed severe multivessel disease to include 90% mid RCA lesion, ostial circumflex to proximal circumflex lesion 80% stenosed, proximal LAD to mid LAD 75% stenosed with markedly positive DFR 0.71, first diagonal was 70% stenosed, proximal RCA to mid RCA was 35% stenosed, distal RCA 1 lesion 40% stenosis was prior stent revealing mild in-stent restenosis, distal RCA to lesion 80% stenosed. The patient was referred to CVTS for surgical consultation for CABG.  The patient underwent a four-vessel CABG on 03/29/2019 with LIMA to LAD, SVG to diagonal, SVG to OM, and SVG to PDA. He had endoscopic harvesting of the greater saphenous vein from his right leg. Postoperatively he had thrombocytopenia on Lovenox and this was discontinued, he was also found to have postoperative CHF and was diuresed aggressively. He was followed by nephrology throughout hospital course. He was discharged on 04/03/2019  As a part of evaluation he had a chest x-ray done which showed some loculated large fluid in the left pleura, also echocardiogram being done under some suspicion for endocarditis of the aortic valve.  He comes today to my office to talk about dose issues. Overall he complained of being weak tired exhausted.  He denies have any fever no chills.  He does have some exertional  shortness of breath but no swelling of lower extremities he said he does not feel right.  He also got history of endocarditis which was diagnosed in 2019.  He came with fever of unknown origin quite extensive evaluation was done some suspicious structure was seen on both aortic as well as mitral valve and then he was treated for endocarditis.  He tells me that at that time he feels very sick.  Now he just feels weak and tired nothing as compared to his sickness before.  Past Medical History:  Diagnosis Date  . Diabetes mellitus without complication ([place])   . Hypertension   . MI (myocardial infarction) ([place])   . Renal disorder     Past Surgical History:  Procedure Laterality Date  . ABDOMINAL AORTIC ANEURYSM REPAIR    . APPENDECTOMY    . AV FISTULA PLACEMENT Right 10/18/2018   Procedure: BRACHIO-CEPHALIC ARTERIOVENOUS (AV) FISTULA CREATION;  Surgeon: Angelia Mould, MD;  Location: [facility];  Service: Vascular;  Laterality: Right;  . CARDIAC CATHETERIZATION    . CORONARY ANGIOPLASTY    . CORONARY ARTERY BYPASS GRAFT N/A 03/29/2019   Procedure: CORONARY ARTERY BYPASS GRAFTING (CABG) x 4, using left internal mammary artery and right leg greater saphenous vein harvested endoscopically;  Surgeon: Melrose Nakayama, MD;  Location: [facility];  Service: Open Heart Surgery;  Laterality: N/A;  . CORONARY PRESSURE WIRE/FFR WITH 3D MAPPING N/A 03/27/2019   Procedure: Coronary Pressure Wire/FFR w/3D Mapping;  Surgeon: Jettie Booze, MD;  Location: [facility];  Service: Cardiovascular;  Laterality:  N/A;  . IR FLUORO GUIDE CV LINE RIGHT  09/13/2017  . IR REMOVAL TUN CV CATH W/O FL  10/22/2017  . IR US GUIDE VASC ACCESS RIGHT  09/13/2017  . LEFT HEART CATH AND CORONARY ANGIOGRAPHY N/A 03/27/2019   Procedure: LEFT HEART CATH AND CORONARY ANGIOGRAPHY;  Surgeon: Jettie Booze, MD;  Location: [facility];  Service: Cardiovascular;  Laterality: N/A;  . NECK SURGERY    . SMALL  INTESTINE SURGERY    . TEE WITHOUT CARDIOVERSION N/A 09/10/2017   Procedure: TRANSESOPHAGEAL ECHOCARDIOGRAM (TEE);  Surgeon: Skeet Latch, MD;  Location: [facility];  Service: Cardiovascular;  Laterality: N/A;  . TEE WITHOUT CARDIOVERSION N/A 03/29/2019   Procedure: TRANSESOPHAGEAL ECHOCARDIOGRAM (TEE);  Surgeon: Melrose Nakayama, MD;  Location: [facility];  Service: Open Heart Surgery;  Laterality: N/A;    Current Medications: Current Meds  Medication Sig  . acetaminophen (TYLENOL) 500 MG tablet Take 1,500 mg by mouth 2 (two) times daily as needed (pain).  Marland Kitchen amLODipine (NORVASC) 5 MG tablet Take 5 mg by mouth daily.  Marland Kitchen aspirin EC 81 MG tablet Take 81 mg by mouth daily.  Marland Kitchen atorvastatin (LIPITOR) 40 MG tablet Take 40 mg by mouth daily.  . calcitRIOL (ROCALTROL) 0.5 MCG capsule Take 0.5 mcg by mouth daily.  . Dulaglutide (TRULICITY) 1.5 0000000 SOPN Inject 1.5 mg into the skin every Sunday.  . ferrous sulfate 325 (65 FE) MG tablet Take 325 mg by mouth 2 (two) times daily with a meal.  . gabapentin (NEURONTIN) 100 MG capsule Take 2 capsules (200 mg total) by mouth at bedtime.  Marland Kitchen glipiZIDE (GLUCOTROL XL) 2.5 MG 24 hr tablet Take 2.5 mg by mouth 2 (two) times daily.  Marland Kitchen linagliptin (TRADJENTA) 5 MG TABS tablet Take 5 mg by mouth daily.  . metoprolol succinate (TOPROL-XL) 50 MG 24 hr tablet Take 50 mg by mouth daily.  Marland Kitchen OVER THE COUNTER MEDICATION Place 1 drop into both eyes 2 (two) times daily as needed (dry eyes). Hyper Tears eye drops  . patiromer (VELTASSA) 8.4 g packet Take 1 packet (8.4 g total) by mouth daily at 3 pm.  . rOPINIRole (REQUIP) 0.25 MG tablet Take 0.25 mg by mouth at bedtime.  . sevelamer carbonate (RENVELA) 800 MG tablet Take 1 tablet (800 mg total) by mouth 3 (three) times daily with meals.  . tamsulosin (FLOMAX) 0.4 MG CAPS capsule Take 0.4 mg by mouth daily before supper.  . zolpidem (AMBIEN) 5 MG tablet Take 10 mg by mouth at bedtime.     Allergies:   Penicillins     Social History   Socioeconomic History  . Marital status: Married    Spouse name: Not on file  . Number of children: Not on file  . Years of education: Not on file  . Highest education level: Not on file  Occupational History  . Not on file  Tobacco Use  . Smoking status: Former Research scientist (life sciences)  . Smokeless tobacco: Never Used  Substance and Sexual Activity  . Alcohol use: Yes    Comment: Occasionally.  . Drug use: Never  . Sexual activity: Not on file  Other Topics Concern  . Not on file  Social History Narrative  . Not on file   Social Determinants of Health   Financial Resource Strain:   . Difficulty of Paying Living Expenses: Not on file  Food Insecurity:   . Worried About Charity fundraiser in the Last Year: Not on file  .  Ran Out of Food in the Last Year: Not on file  Transportation Needs:   . Lack of Transportation (Medical): Not on file  . Lack of Transportation (Non-Medical): Not on file  Physical Activity:   . Days of Exercise per Week: Not on file  . Minutes of Exercise per Session: Not on file  Stress:   . Feeling of Stress : Not on file  Social Connections:   . Frequency of Communication with Friends and Family: Not on file  . Frequency of Social Gatherings with Friends and Family: Not on file  . Attends Religious Services: Not on file  . Active Member of Clubs or Organizations: Not on file  . Attends Archivist Meetings: Not on file  . Marital Status: Not on file     Family History: The patient's family history includes Cancer in his maternal grandmother. ROS:   Please see the history of present illness.    All 14 point review of systems negative except as described per history of present illness  EKGs/Labs/Other Studies Reviewed:    09/10/18 TEE : EF 55-60%. "Aortic valve: There was a 1.0 cm (L) x 0.2 cm (W), strand-like echodensity intermittently seen in the LVOT. It is unclear if it is attached to the aortic valve. Mitral Valve Very  small 0.6 cm (L) x 0.2 cm (W) filamentous hypodensity intermittently seen on the atrial side of the anterior leaflet.  Impression: Very small, strand-like echodensities were noted on the mitral and aortic valves. Cannot rule out early endocarditis. No clear vegetations were noted   01/02/2020 transthoracic echocardiogram: 1. Left ventricular ejection fraction, by estimation, is 40 to 45%. The  left ventricle has moderately decreased function. The left ventrical  demonstrates global hypokinesis. There is moderately increased left  ventricular hypertrophy. Left ventricular  diastolic parameters are consistent with Grade II diastolic dysfunction  (pseudonormalization). Elevated left ventricular pressure. The average  left ventricular global longitudinal strain is -11.8 %.  2. Right ventricular systolic function is normal. The right ventricular  size is mildly enlarged. There is moderately elevated pulmonary artery  systolic pressure.  3. Moderate mitral valve regurgitation.  4. Aortic valve vegetation is visualized on the noncoronary cusp.  5. The aortic valve is abnormal. Aortic valve regurgitation is not  visualized. No aortic stenosis is present.  6. There is moderate dilatation of the ascending aorta measuring 43 mm.  7. The inferior vena cava is dilated in size with >50% respiratory  variability, suggesting right atrial pressure of 8 mmHg.  Recent Labs: 03/29/2019: ALT 37 03/30/2019: Magnesium 2.2 04/04/2019: BUN 65; Creatinine, Ser 5.40; Potassium 4.9; Sodium 136 01/08/2020: Hemoglobin 10.2; Platelets 227  Recent Lipid Panel    Component Value Date/Time   CHOL 86 (L) 01/02/2020 0952   TRIG 41 01/02/2020 0952   HDL 50 01/02/2020 0952   CHOLHDL 1.7 01/02/2020 0952   CHOLHDL 2.5 03/25/2019 0454   VLDL 16 03/25/2019 0454   LDLCALC 24 01/02/2020 0952    Physical Exam:    VS:  BP 128/70   Pulse 90   Ht 6\' 2"  (1.88 m)   Wt 211 lb 6.4 oz (95.9 kg)   SpO2 96%   BMI 27.14  kg/m     Wt Readings from Last 3 Encounters:  01/09/20 211 lb 6.4 oz (95.9 kg)  12/27/19 212 lb (96.2 kg)  07/12/19 207 lb (93.9 kg)     GEN:  Well nourished, well developed in no acute distress HEENT: Normal NECK: No JVD;  No carotid bruits LYMPHATICS: No lymphadenopathy CARDIAC: RRR, no murmurs, no rubs, no gallops RESPIRATORY:  Clear to auscultation without rales, wheezing or rhonchi poor air entry to the left base ABDOMEN: Soft, non-tender, non-distended MUSCULOSKELETAL:  No edema; No deformity  SKIN: Warm and dry LOWER EXTREMITIES: no swelling NEUROLOGIC:  Alert and oriented x 3 PSYCHIATRIC:  Normal affect   ASSESSMENT:    1. S/P CABG x 4   2. History of endocarditis   3. Pleural effusion on left   4. Ischemic cardiomyopathy   5. Essential hypertension   6. Chronic kidney disease (CKD), stage IV (severe) (HCC)    PLAN:    In order of problems listed above:  1. Status post coronary artery bypass graft wound healed completely.  Doing well from that point review 2. History of endocarditis in 2019 and now he does have some lesion on the aortic valve looks like a possible vegetation.  I will schedule him to have transesophageal echocardiogram again to look at the lesion both on the aortic and mitral valve.  Likely he does not have white blood cell count elevation, also does not have fever, he does have however high sedimentation rate.  We waiting for blood cultures.  I do not think we have enough indication for active process to start antibiotic at this stage. 3. Pleural effusion in the left side.  We getting him with pulmonary team so he can be taking care of.  He may require drainage. 4. Essential hypertension blood pressure controlled continue present management. 5. Chronic kidney failure.  Noted followed by nephrology. 6. Cardiomyopathy with diminished ejection fraction I will add hydralazine 10 mg 3 times daily to his medical regimen.  I will see him back within a month to  continue augmenting his therapy.   Medication Adjustments/Labs and Tests Ordered: Current medicines are reviewed at length with the patient today.  Concerns regarding medicines are outlined above.  No orders of the defined types were placed in this encounter.  Medication changes: No orders of the defined types were placed in this encounter.   Signed, Park Liter, MD, [facility] 01/09/2020 9:23 AM    [place]

## 2020-01-09 NOTE — Patient Instructions (Addendum)
Medication Instructions:   START: hydralazine 10 mg three times daily    *If you need a refill on your cardiac medications before your next appointment, please call your pharmacy*  Lab Work: You will have las and blood cultures drawn today.   If you have labs (blood work) drawn today and your tests are completely normal, you will receive your results only by: Marland Kitchen MyChart Message (if you have MyChart) OR . A paper copy in the mail If you have any lab test that is abnormal or we need to change your treatment, we will call you to review the results.  Testing/Procedures:  You are scheduled for a TEE on 01/09/2020 with Dr. Stanford Breed.  Please arrive at the [facility] (Main Entrance A) at [facility]: [address] [place], [place] [address] at 7 am. (1 hour prior to procedure unless lab work is needed; if lab work is needed arrive 1.5 hours ahead)  DIET: Nothing to eat or drink after midnight except a sip of water with medications (see medication instructions below)  Medication Instructions: Hold Glipizide, tradjenta, and trulicity     Labs: You will have labs drawn today.    You must have a responsible person to drive you home and stay in the waiting area during your procedure. Failure to do so could result in cancellation.  Bring your insurance cards.  *Special Note: Every effort is made to have your procedure done on time. Occasionally there are emergencies that occur at the hospital that may cause delays. Please be patient if a delay does occur.   Follow-Up: At [facility], you and your health needs are our priority.  As part of our continuing mission to provide you with exceptional heart care, we have created designated Provider Care Teams.  These Care Teams include your primary Cardiologist (physician) and Advanced Practice Providers (APPs -  Physician Assistants and Nurse Practitioners) who all work together to provide you with the care you need, when you need  it.  Your next appointment:   1 month(s)  The format for your next appointment:   In Person  Provider:   Jenne Campus, MD  Other Instructions  Hydralazine tablets What is this medicine? HYDRALAZINE (hye DRAL a zeen) is a type of vasodilator. It relaxes blood vessels, increasing the blood and oxygen supply to your heart. This medicine is used to treat high blood pressure. This medicine may be used for other purposes; ask your health care provider or pharmacist if you have questions. COMMON BRAND NAME(S): Apresoline What should I tell my health care provider before I take this medicine? They need to know if you have any of these conditions:  blood vessel disease  heart disease including angina or history of heart attack  kidney or liver disease  systemic lupus erythematosus (SLE)  an unusual or allergic reaction to hydralazine, tartrazine dye, other medicines, foods, dyes, or preservatives  pregnant or trying to get pregnant  breast-feeding How should I use this medicine? Take this medicine by mouth with a glass of water. Follow the directions on the prescription label. Take your doses at regular intervals. Do not take your medicine more often than directed. Do not stop taking except on the advice of your doctor or health care professional. Talk to your pediatrician regarding the use of this medicine in children. Special care may be needed. While this drug may be prescribed for children for selected conditions, precautions do apply. Overdosage: If you think you have taken too  much of this medicine contact a poison control center or emergency room at once. NOTE: This medicine is only for you. Do not share this medicine with others. What if I miss a dose? If you miss a dose, take it as soon as you can. If it is almost time for your next dose, take only that dose. Do not take double or extra doses. What may interact with this medicine?  medicines for high blood  pressure  medicines for mental depression This list may not describe all possible interactions. Give your health care provider a list of all the medicines, herbs, non-prescription drugs, or dietary supplements you use. Also tell them if you smoke, drink alcohol, or use illegal drugs. Some items may interact with your medicine. What should I watch for while using this medicine? Visit your doctor or health care professional for regular checks on your progress. Check your blood pressure and pulse rate regularly. Ask your doctor or health care professional what your blood pressure and pulse rate should be and when you should contact him or her. You may get drowsy or dizzy. Do not drive, use machinery, or do anything that needs mental alertness until you know how this medicine affects you. Do not stand or sit up quickly, especially if you are an older patient. This reduces the risk of dizzy or fainting spells. Alcohol may interfere with the effect of this medicine. Avoid alcoholic drinks. Do not treat yourself for coughs, colds, or pain while you are taking this medicine without asking your doctor or health care professional for advice. Some ingredients may increase your blood pressure. What side effects may I notice from receiving this medicine? Side effects that you should report to your doctor or health care professional as soon as possible:  chest pain, or fast or irregular heartbeat  fever, chills, or sore throat  numbness or tingling in the hands or feet  shortness of breath  skin rash, redness, blisters or itching  stiff or swollen joints  sudden weight gain  swelling of the feet or legs  swollen lymph glands  unusual weakness Side effects that usually do not require medical attention (report to your doctor or health care professional if they continue or are bothersome):  diarrhea, or constipation  headache  loss of appetite  nausea, vomiting This list may not describe all  possible side effects. Call your doctor for medical advice about side effects. You may report side effects to FDA at 1-800-FDA-1088. Where should I keep my medicine? Keep out of the reach of children. Store at room temperature between 15 and 30 degrees C (59 and 86 degrees F). Throw away any unused medicine after the expiration date. NOTE: This sheet is a summary. It may not cover all possible information. If you have questions about this medicine, talk to your doctor, pharmacist, or health care provider.  2020 Elsevier/Gold Standard (2008-03-23 15:44:58)   Transesophageal Echocardiogram  Transesophageal echocardiogram (TEE) is a test that uses sound waves (echocardiogram) to produce very clear, detailed images of the heart. TEE is done by passing a flexible tube down the esophagus. The heart is located in front of the esophagus. TEE may be done:  To check how well your heart valves are working.  To check for any abnormal growth or tumor  To look for blood clots  To check for infection of the lining of the heart (endocarditis).  To evaluate the dividing wall (septum) of the heart and check for a hole that did  not close after birth (patent foramen ovale or atrial septal defect).  To help diagnose a tear in the wall of the blood vessels (aortic dissection).  To look at the heart shape, size, and function. Any changes can be associated with certain conditions, including heart failure, aneurysm, and coronary heart disease, CAD.  During cardiac valve surgery. This allows the surgeon to assess the valve repair before closing the chest.  During a variety of other cardiac procedures to guide positioning of catheters.  To monitor your heart's response to IV fluids or medicine. TEE is usually not a painful procedure. You may feel the probe press against the back of the throat. The probe does not enter the trachea and does not affect your breathing. Tell a health care provider about:  Any  allergies you have.  All medicines you are taking, including vitamins, herbs, eye drops, creams, and over-the-counter medicines.  Any problems you or family members have had with anesthetic medicines.  Any blood disorders you have.  Any surgeries you have had.  Any medical conditions you have.  Any swallowing difficulties.  Whether you have or have had a blockage of the esophagus (esophageal obstruction).  Whether you are pregnant or may be pregnant. What are the risks? Generally, this is a safe procedure. However, problems may occur, including:  Damage to other structures or organs.  A tear of the esophagus (esophageal rupture).  Irregular heart beat (arrhythmia).  Hoarse voice or difficulty swallowing.  Bleeding (hemorrhage). What happens before the procedure? Staying hydrated Follow instructions from your health care provider about hydration, which may include:  Up to 3 hours before the procedure - you may continue to drink clear liquids, such as water, clear fruit juice, black coffee, and plain tea. Eating and drinking Follow instructions from your health care provider about eating and drinking, which may include:  8 hours before the procedure - stop eating heavy meals or foods such as meat, fried foods, or fatty foods.  6 hours before the procedure - stop eating light meals or foods, such as toast or cereal.  6 hours before the procedure - stop drinking milk or drinks that contain milk.  3 hours before the procedure - stop drinking clear liquids. General instructions  You will need to remove any dentures or dental retainers.  Plan to have someone take you home from the hospital or clinic.  Plan to have a responsible adult care for you for at least 24 hours after you leave the hospital or clinic. This is important.  Ask your health care provider about: ? Changing or stopping your normal medicines. This is important if you take diabetes medicines or blood  thinners. ? Taking over-the-counter medicines, vitamins, herbs, and supplements. ? Taking medicines such as aspirin and ibuprofen. These medicines can thin your blood. Do not take these medicines unless your health care provider tells you to take them. What happens during the procedure?  To reduce your risk of infection: ? Your health care team will wash or sanitize their hands. ? Hair may be removed from the surgical area. ? Your skin will be washed with soap.  An IV will be inserted into one of your veins.  You will be given one or both of the following: ? A medicine to help you relax (sedative). ? A medicine to be sprayed or gargled in order to numb the back of your throat (local anesthetic).  Your blood pressure, heart rate, and breathing (vital signs) will be monitored during  the procedure.  You may be asked to lay on your left side.  A bite block will be placed in your mouth to keep you from biting the tube during the procedure.  The tip of the TEE probe will be placed into the back of your mouth. You will be asked to swallow. This helps to pass the tip of the probe into the esophagus.  Once the tip of the probe is in the correct area, your health care provider will take pictures of the heart.  The probe and bite block will be removed when the procedure is done. The procedure may vary among health care providers and hospitals What happens after the procedure?  Your blood pressure, heart rate, breathing rate, and blood oxygen level will be monitored until the medicines you were given have worn off.  When you first wake up, your throat may feel slightly sore and will probably still feel numb. This will improve slowly over time. You will not be allowed to eat or drink until the numbness has gone away.  Do not drive for 24 hours if you received a sedative. Summary  Transesophageal echocardiogram (TEE) is a test that uses sound waves (echocardiogram) to produce very clear,  detailed images of the heart.  TEE is done by passing a flexible tube down the esophagus.  Generally, this is a safe procedure. However, problems may occur, including damage to other structures or organs, bleeding, irregular heart beat, and a hoarse voice or trouble swallowing.  Tell your health care provider about any medicines and medical conditions you may have, as some conditions may increase your risk of complications. This information is not intended to replace advice given to you by your health care provider. Make sure you discuss any questions you have with your health care provider. Document Revised: 04/20/2017 Document Reviewed: 02/05/2017 Elsevier Patient Education  [place].

## 2020-01-09 NOTE — Procedures (Signed)
John [name] w/ [place] Note Informed consent obtained, risk of hemothorax and pneumothorax discussed Timeout performed Left chest examined with [place] and skin overlying fluid pocket marked Area prepped and anesthesized with 1% lidocaine 600  cc bloody  fluid removed before patient c/o pleurisy Catheter removed and bandaid applied to site CXR no PTX No immediate complications Fluid sent for cell count, ldh, protein, cytology

## 2020-01-10 ENCOUNTER — Telehealth: Payer: Self-pay | Admitting: Cardiology

## 2020-01-10 LAB — CYTOLOGY - NON PAP

## 2020-01-10 NOTE — Telephone Encounter (Signed)
John [name] from [facility] calling stating the patient wants to cancel his TEE for tomorrow. She would like a call back to reschedule it for him.

## 2020-01-10 NOTE — Telephone Encounter (Signed)
Called patient. Informed him that his TEE has been rescheduled to Friday 01/12/2020 at 10 am. He verbally understood. No further questions.

## 2020-01-11 ENCOUNTER — Telehealth: Payer: Self-pay | Admitting: *Deleted

## 2020-01-11 LAB — LD, BODY FLUID (OTHER): LD, Body Fluid: 219 IU/L

## 2020-01-11 LAB — PROTEIN, BODY FLUID (OTHER): Protein, Fluid: 2.9 g/dL

## 2020-01-11 NOTE — Telephone Encounter (Signed)
Patient informed. Copy sent to PCP °

## 2020-01-11 NOTE — Telephone Encounter (Signed)
-----   Message from Park Liter, MD sent at 01/10/2020 12:39 PM EST ----- Creatinine elevated however MI months ago.  Continue present management

## 2020-01-12 ENCOUNTER — Encounter (HOSPITAL_COMMUNITY): Payer: Self-pay | Admitting: Cardiovascular Disease

## 2020-01-12 ENCOUNTER — Ambulatory Visit (HOSPITAL_COMMUNITY)
Admission: RE | Admit: 2020-01-12 | Discharge: 2020-01-12 | Disposition: A | Discharge status: Home / Self Care | Payer: Medicare HMO | Attending: Cardiovascular Disease | Admitting: Cardiovascular Disease

## 2020-01-12 ENCOUNTER — Encounter (HOSPITAL_COMMUNITY)
Admission: RE | Disposition: A | Discharge status: Home / Self Care | Payer: Self-pay | Source: Home / Self Care | Attending: Cardiovascular Disease

## 2020-01-12 ENCOUNTER — Ambulatory Visit (HOSPITAL_BASED_OUTPATIENT_CLINIC_OR_DEPARTMENT_OTHER)
Admission: RE | Admit: 2020-01-12 | Discharge: 2020-01-12 | Disposition: A | Discharge status: Home / Self Care | Payer: Medicare HMO | Source: Home / Self Care | Attending: Cardiovascular Disease | Admitting: Cardiovascular Disease

## 2020-01-12 ENCOUNTER — Other Ambulatory Visit: Payer: Self-pay

## 2020-01-12 DIAGNOSIS — I2510 Atherosclerotic heart disease of native coronary artery without angina pectoris: Secondary | ICD-10-CM | POA: Diagnosis not present

## 2020-01-12 DIAGNOSIS — Z87891 Personal history of nicotine dependence: Secondary | ICD-10-CM | POA: Insufficient documentation

## 2020-01-12 DIAGNOSIS — Z7984 Long term (current) use of oral hypoglycemic drugs: Secondary | ICD-10-CM | POA: Diagnosis not present

## 2020-01-12 DIAGNOSIS — Z8679 Personal history of other diseases of the circulatory system: Secondary | ICD-10-CM | POA: Insufficient documentation

## 2020-01-12 DIAGNOSIS — Z95828 Presence of other vascular implants and grafts: Secondary | ICD-10-CM | POA: Diagnosis not present

## 2020-01-12 DIAGNOSIS — I38 Endocarditis, valve unspecified: Secondary | ICD-10-CM

## 2020-01-12 DIAGNOSIS — Z7901 Long term (current) use of anticoagulants: Secondary | ICD-10-CM | POA: Diagnosis not present

## 2020-01-12 DIAGNOSIS — I509 Heart failure, unspecified: Secondary | ICD-10-CM | POA: Insufficient documentation

## 2020-01-12 DIAGNOSIS — Z880 Allergy status to penicillin: Secondary | ICD-10-CM | POA: Insufficient documentation

## 2020-01-12 DIAGNOSIS — I080 Rheumatic disorders of both mitral and aortic valves: Secondary | ICD-10-CM | POA: Diagnosis not present

## 2020-01-12 DIAGNOSIS — R7881 Bacteremia: Secondary | ICD-10-CM | POA: Insufficient documentation

## 2020-01-12 DIAGNOSIS — I255 Ischemic cardiomyopathy: Secondary | ICD-10-CM | POA: Diagnosis not present

## 2020-01-12 DIAGNOSIS — I252 Old myocardial infarction: Secondary | ICD-10-CM | POA: Diagnosis not present

## 2020-01-12 DIAGNOSIS — Z79899 Other long term (current) drug therapy: Secondary | ICD-10-CM | POA: Insufficient documentation

## 2020-01-12 DIAGNOSIS — Z7982 Long term (current) use of aspirin: Secondary | ICD-10-CM | POA: Diagnosis not present

## 2020-01-12 DIAGNOSIS — Z951 Presence of aortocoronary bypass graft: Secondary | ICD-10-CM | POA: Diagnosis not present

## 2020-01-12 DIAGNOSIS — I130 Hypertensive heart and chronic kidney disease with heart failure and stage 1 through stage 4 chronic kidney disease, or unspecified chronic kidney disease: Secondary | ICD-10-CM | POA: Insufficient documentation

## 2020-01-12 DIAGNOSIS — I340 Nonrheumatic mitral (valve) insufficiency: Secondary | ICD-10-CM | POA: Diagnosis not present

## 2020-01-12 DIAGNOSIS — J90 Pleural effusion, not elsewhere classified: Secondary | ICD-10-CM | POA: Insufficient documentation

## 2020-01-12 DIAGNOSIS — E1122 Type 2 diabetes mellitus with diabetic chronic kidney disease: Secondary | ICD-10-CM | POA: Diagnosis not present

## 2020-01-12 DIAGNOSIS — N184 Chronic kidney disease, stage 4 (severe): Secondary | ICD-10-CM | POA: Insufficient documentation

## 2020-01-12 HISTORY — PX: TEE WITHOUT CARDIOVERSION: SHX5443

## 2020-01-12 LAB — GLUCOSE, CAPILLARY: Glucose-Capillary: 105 mg/dL — ABNORMAL HIGH (ref 70–99)

## 2020-01-12 SURGERY — ECHOCARDIOGRAM, TRANSESOPHAGEAL
Anesthesia: Moderate Sedation

## 2020-01-12 MED ORDER — MIDAZOLAM HCL (PF) 10 MG/2ML IJ SOLN
INTRAMUSCULAR | Status: DC | PRN
  Administered 2020-01-12 (×2): 2 mg via INTRAVENOUS

## 2020-01-12 MED ORDER — SODIUM CHLORIDE 0.9 % IV SOLN: INTRAVENOUS | Status: DC

## 2020-01-12 MED ORDER — FENTANYL CITRATE (PF) 100 MCG/2ML IJ SOLN
INTRAMUSCULAR | Status: AC
  Filled 2020-01-12: qty 4

## 2020-01-12 MED ORDER — FENTANYL CITRATE (PF) 100 MCG/2ML IJ SOLN
INTRAMUSCULAR | Status: DC | PRN
  Administered 2020-01-12 (×2): 25 ug via INTRAVENOUS

## 2020-01-12 MED ORDER — BUTAMBEN-TETRACAINE-BENZOCAINE 2-2-14 % EX AERO
INHALATION_SPRAY | CUTANEOUS | Status: DC | PRN
  Administered 2020-01-12: 2 via TOPICAL

## 2020-01-12 MED ORDER — MIDAZOLAM HCL (PF) 5 MG/ML IJ SOLN
INTRAMUSCULAR | Status: AC
  Filled 2020-01-12: qty 2

## 2020-01-12 NOTE — Interval H&P Note (Signed)
History and Physical Interval Note:  01/12/2020 10:03 AM  John [name]  has presented today for surgery, with the diagnosis of ENDOCARDITIS.  The various methods of treatment have been discussed with the patient and family. After consideration of risks, benefits and other options for treatment, the patient has consented to  Procedure(s): TRANSESOPHAGEAL ECHOCARDIOGRAM (TEE) (N/A) as a surgical intervention.  The patient's history has been reviewed, patient examined, no change in status, stable for surgery.  I have reviewed the patient's chart and labs.  Questions were answered to the patient's satisfaction.     Daneesha Quinteros

## 2020-01-12 NOTE — Op Note (Signed)
INDICATIONS: infective endocarditis  PROCEDURE:   Informed consent was obtained prior to the procedure. The risks, benefits and alternatives for the procedure were discussed and the patient comprehended these risks.  Risks include, but are not limited to, cough, sore throat, vomiting, nausea, somnolence, esophageal and stomach trauma or perforation, bleeding, low blood pressure, aspiration, pneumonia, infection, trauma to the teeth and death.    After a procedural time-out, the oropharynx was anesthetized with 20% benzocaine spray.   During this procedure the patient was administered a total of Versed 4 mg and Fentanyl 50 mcg IV to achieve and maintain moderate conscious sedation.  The patient's heart rate, blood pressure, and oxygen saturationweare monitored continuously during the procedure. The period of conscious sedation was 17 minutes, of which I was present face-to-face 100% of this time.  The transesophageal probe was inserted in the esophagus and stomach without difficulty and multiple views were obtained.  The patient was kept under observation until the patient left the procedure room.  The patient left the procedure room in stable condition.   Agitated microbubble saline contrast was not administered.  COMPLICATIONS:    There were no immediate complications.  FINDINGS:  No evidence of endocarditis. There is mild degenerative aortic valve sclerosis. A tiny fibrinous strand on the mitral valve is not suggestive of a vegetation. Trivial MR, no AI, mild TR. Mildly depressed LV systolic function, inferior wall hypokinesis. A small, heavily loculated left pleural effusion is present.  RECOMMENDATIONS:     Continue treatment for inflammatory pleural effusion.  Time Spent Directly with the Patient:  45 minutes   Isay Perleberg 01/12/2020, 11:26 AM

## 2020-01-12 NOTE — Discharge Instructions (Signed)

## 2020-01-12 NOTE — Progress Notes (Signed)
  Echocardiogram Echocardiogram Transesophageal has been performed.  Michiel Cowboy 01/12/2020, 11:32 AM

## 2020-01-12 NOTE — Progress Notes (Deleted)
  Echocardiogram 2D Echocardiogram has been performed.  Michiel Cowboy 01/12/2020, 11:31 AM

## 2020-01-14 LAB — CULTURE, BLOOD (SINGLE)

## 2020-01-16 ENCOUNTER — Other Ambulatory Visit: Payer: Self-pay

## 2020-01-16 ENCOUNTER — Telehealth: Payer: Self-pay | Admitting: Primary Care

## 2020-01-16 ENCOUNTER — Ambulatory Visit (INDEPENDENT_AMBULATORY_CARE_PROVIDER_SITE_OTHER): Payer: Medicare HMO | Admitting: Primary Care

## 2020-01-16 ENCOUNTER — Encounter: Payer: Self-pay | Admitting: Primary Care

## 2020-01-16 DIAGNOSIS — J90 Pleural effusion, not elsewhere classified: Secondary | ICD-10-CM

## 2020-01-16 DIAGNOSIS — R0602 Shortness of breath: Secondary | ICD-10-CM

## 2020-01-16 MED ORDER — ALBUTEROL SULFATE HFA 108 (90 BASE) MCG/ACT IN AERS: 2.0000 | INHALATION_SPRAY | Freq: Four times a day (QID) | RESPIRATORY_TRACT | 1 refills | Status: DC | PRN

## 2020-01-16 NOTE — Telephone Encounter (Signed)
Ok thanks. Do you want me to follow up with you or John [name] after CXR if he needs another thoracentesis

## 2020-01-16 NOTE — Telephone Encounter (Signed)
Just spoke with John [name]. He reports having some initial soreness on his left side radiating to anterior chest wall. No current chest pain. Continues to have fatigue and dyspnea with exertion. Recovers with rest.  Shortness of breath returned on Saturday 2/20. States that his shortness of breath is not quite as bad as it was prior to thoracentesis. No fevers.   I'm getting repeat CXR on Friday. Ordering PFTs in 4 weeks to evaluated for underlying COPD d.t smoking hx and chronic bronchitis on CXR. Sending in albuterol rescue inhaler.  Your note mentioned possibly trying antibiotics d.t elevated ESR. Do you want me to consider this or wait til CXR results  - Beth    

## 2020-01-16 NOTE — Telephone Encounter (Signed)
Wait for CXR May need another tap Okay for short course of abx 5-7 days

## 2020-01-16 NOTE — Patient Instructions (Signed)
Orders: CXR Friday re: fu pleural effusion PFTs in 4 weeks  Rx: Albuterol 2 puffs every 4-6 hours as needed for breakthrough shortness of breath/wheezing  FU: 1 month

## 2020-01-16 NOTE — Telephone Encounter (Signed)
Any of John [name]/John [name]/John [name]/John [name]/John [name]

## 2020-01-16 NOTE — Progress Notes (Signed)
Virtual Visit via Telephone Note  I connected with John [name] on 01/16/20 at  3:00 PM EST by telephone and verified that I am speaking with the correct person using two identifiers.  Location: Patient: Home Provider: Office   I discussed the limitations, risks, security and privacy concerns of performing an evaluation and management service by telephone and the availability of in person appointments. I also discussed with the patient that there may be a patient responsible charge related to this service. The patient expressed understanding and agreed to proceed.   History of Present Illness: 69 year old male, former smoker quit in [place] (60 pack year hx). PMH significant for left pleural effusion, NSTEMI, HTN, cardiomyopathy, chronic infective endocarditis, MSSA bacteremia, DM type 2, CKD stage IV, HLD, back pain. Patient of Dr. Loanne Drilling, seen by Dr. Tamala Julian on 2/16 d/t worsening dyspnea and cough over past several weeks. CXR showed left effusion present since CABG in May 2020 but enlarged. Loculated effusion suggesting entrapment physiology from chronicity, not felt to be infected. No WBC but elevated ESR, abx on hold but if continues to not feel well can try. He under went a thoracentesis performed on 01/09/20 with 600cc bloody fluid removed. CXR showed no pneumothorax. Fluid sent for cell count, LDH, protein and cytology.   01/16/2020 Patient contacted today for 1 week televisit following thoracentesis. He is doing ok, reports initial soreness on his left side radiating to anterior chest wall. No current chest pain. Continue to have fatigue and has been experiencing shortness of breath with exertion since Saturday 2/20. 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 Reports that he is fine as long as he is sitting but if he has to get up quickly he gets winded. He does recover after a few minutes rest. States that his dyspnea is no quite as bad as it was prior to thoracentesis.  Denies chest pain, chest tightness or wheezing. Has chronic back pain  Imaging: 01/09/20 CXR- showed persistent small loculated appearing left pleural fluid collection, left lower lobe atelectasis or infiltrate. Persistent peribronchial thickening and increased interstitial markings suggesting bronchitis.   Observations/Objective:  - Able to speak in full sentences - No observed shortness of breath, wheezing or cough  Assessment and Plan:  Loculated pleural effusion, left: - S/p thoracentesis by Dr. Tamala Julian on 2/16 with 600cc bloody fluid removed - CXR post procedure showed no evidence on pneumothorax; persistent small loculated left pleural effusion/LLL atelectasis or infiltrate and and peribronchial thickening  - Checking CXR, consider abx for 5-7 days/may need repeat thoracentesis  - Exudative pleural fluid, no malignant cell identified, BC negative   Dyspnea: - SOB returned 4 days after thoracentesis, worse with exertion and recovers with rest - Denies chest pain, tightness or wheezing - CXR, PFTs and prn albuterol hfa   Follow Up Instructions:   - 4 weeks with Dr. Loanne Drilling or APP with PFTs  I discussed the assessment and treatment plan with the patient. The patient was provided an opportunity to ask questions and all were answered. The patient agreed with the plan and demonstrated an understanding of the instructions.   The patient was advised to call back or seek an in-person evaluation if the symptoms worsen or if the condition fails to improve as anticipated.  I provided 22 minutes of non-face-to-face time during this encounter.   Martyn Ehrich, NP

## 2020-01-17 NOTE — Progress Notes (Signed)
Hey, just to let you know, our PFT's our booked out until mid May 2021. Do you want to push his appt until then or just the PFT?

## 2020-01-19 ENCOUNTER — Ambulatory Visit (INDEPENDENT_AMBULATORY_CARE_PROVIDER_SITE_OTHER): Payer: Medicare HMO

## 2020-01-19 DIAGNOSIS — R0602 Shortness of breath: Secondary | ICD-10-CM | POA: Diagnosis not present

## 2020-01-19 DIAGNOSIS — J90 Pleural effusion, not elsewhere classified: Secondary | ICD-10-CM | POA: Diagnosis not present

## 2020-01-19 MED ORDER — DOXYCYCLINE HYCLATE 100 MG PO TABS: 100.0000 mg | ORAL_TABLET | Freq: Two times a day (BID) | ORAL | 0 refills | Status: DC

## 2020-01-19 NOTE — Progress Notes (Unsigned)
CXR showed persistent pleural effusion, possible superimposed consolidation to left lower lobe. Recommend course of antibiotics- sending in doxycyline (he has an allergy to amoxicillin). FU OV or televisit visit in 10 days with myself or ellison.

## 2020-01-19 NOTE — Progress Notes (Signed)
CXR showed persistent pleural effusion, possible superimposed consolidation to left lower lobe. Recommend course of antibiotics- sending in doxycyline. FU OV or televisit visit in 10 days with myself or ellison.

## 2020-01-22 ENCOUNTER — Other Ambulatory Visit: Payer: Self-pay

## 2020-01-22 DIAGNOSIS — J90 Pleural effusion, not elsewhere classified: Secondary | ICD-10-CM

## 2020-01-22 DIAGNOSIS — R0602 Shortness of breath: Secondary | ICD-10-CM

## 2020-01-23 ENCOUNTER — Telehealth: Payer: Self-pay | Admitting: Emergency Medicine

## 2020-01-23 NOTE — Telephone Encounter (Signed)
Left lab results on patient's voicemail per dpr, advised hhim to call with any questions.

## 2020-01-30 ENCOUNTER — Other Ambulatory Visit: Payer: Self-pay

## 2020-01-30 ENCOUNTER — Ambulatory Visit (INDEPENDENT_AMBULATORY_CARE_PROVIDER_SITE_OTHER): Payer: Medicare HMO

## 2020-01-30 ENCOUNTER — Encounter: Payer: Self-pay | Admitting: Pulmonary Disease

## 2020-01-30 ENCOUNTER — Ambulatory Visit: Payer: Medicare HMO | Admitting: Pulmonary Disease

## 2020-01-30 ENCOUNTER — Ambulatory Visit: Payer: Medicare HMO | Admitting: Primary Care

## 2020-01-30 VITALS — BP 138/72 | HR 84 | Temp 97.6°F | Ht 72.25 in | Wt 204.0 lb

## 2020-01-30 DIAGNOSIS — J90 Pleural effusion, not elsewhere classified: Secondary | ICD-10-CM

## 2020-01-30 NOTE — Progress Notes (Signed)
@Patient  ID: John [name], male    DOB: 05/25/1951, 69 y.o.   MRN: [id number]  Chief Complaint  Patient presents with  . Follow-up    10 day f/u for pleural effusion. States his breathing has been stable. Still gets SOB when bending over.     Referring provider: Imagene Riches, NP  HPI:  69 year old male former smoker followed in our office for persistent pleural effusion  PMH: Type 2 diabetes, hypertension, hyperlipidemia, chronic kidney disease stage IV, cardiomyopathy, chronic infective endocarditis, status post CABG x4 Smoker/ Smoking History: Former smoker Maintenance:  none Pt of: Dr. Tamala Julian  01/30/2020  - Visit   69 year old male former smoker called our office for recurrent left pleural effusion status post CABG. Patient status post 1 thoracentesis on 01/09/2020.  Patient continues to have ongoing shortness of breath.  Patient had pretty significant pleurisy with first thoracentesis.  Presenting to office today as a follow-up visit.  Chest x-ray today still showing persistent loculated appearing left pleural fluid collection.    We will have Dr. Tamala Julian also see patient today.  Tests:   01/09/2020-chest x-ray-persistent loculated appearing left pleural fluid collection overlying left lower lobe atelectasis or infiltrate, persistent peribronchial thickening and increased interstitial markings suggesting bronchitis  01/19/2020-persistent loculated pleural effusion on the left posteriorly and laterally with probable combination of loculated effusion and rounded atelectasis on the left, there may be superimposed airspace consolidation in a portion of the left lower lobe, the appearance is stable compared to recent studies  01/30/20 - Chest xray - left sided pleural effusion with underlying opacity is unchanged since 01/19/20, right lung clear   Left ventricular ejection fraction is 40 to 45%, mildly decreased function, mild concentric left ventricular hypertrophy, left ventricular  systolic function is normal  01/09/2020-left thoracentesis-600 cc of bloody fluid removed, patient c/O pleurisy 01/09/2020-LD body fluid 219 01/09/2020-protein 2.9 01/09/2020-glucose-25 01/09/2020-body fluid cell count-color red, turbid, lymphs 57%   FENO:  No results found for: NITRICOXIDE  PFT: No flowsheet data found.  WALK:  No flowsheet data found.  Imaging: DG Chest 2 View  Result Date: 01/30/2020 CLINICAL DATA:  Follow-up left-sided pleural effusion. EXAM: CHEST - 2 VIEW COMPARISON:  January 19, 2020 FINDINGS: The left-sided pleural effusion with underlying opacity is unchanged since January 19, 2020. The cardiomediastinal silhouette is stable. No pneumothorax. The right lung remains clear. IMPRESSION: Stable left-sided pleural effusion with underlying opacity. Electronically Signed   By: Dorise Bullion III M.D   On: 01/30/2020 16:27   DG Chest 2 View  Result Date: 01/19/2020 CLINICAL DATA:  Pleural effusion, status post thoracentesis EXAM: CHEST - 2 VIEW COMPARISON:  January 09, 2020 FINDINGS: There remains loculated pleural effusion on the left with atelectatic change in the left lower lung region. Lungs elsewhere are clear. Heart size and pulmonary vascularity are normal. No adenopathy. Patient is status post coronary artery bypass grafting. There is aortic atherosclerosis. No adenopathy. There is postoperative change in the lower cervical spine. There is degenerative change in the thoracic spine. IMPRESSION: Persistent loculated pleural effusion on the left posteriorly and laterally with probable combination of loculated effusion and rounded atelectasis on the left. There may be superimposed airspace consolidation in a portion of the left lower lobe. The appearance is stable compared to recent study. Right lung remains clear. Stable cardiac silhouette. Status post coronary artery bypass grafting. Aortic Atherosclerosis (ICD10-I70.0). Electronically Signed   By: Lowella Grip III  M.D.   On: 01/19/2020 14:55   DG Chest  2 View  Result Date: 01/09/2020 CLINICAL DATA:  Follow-up pleural effusion. EXAM: CHEST - 2 VIEW COMPARISON:  Chest x-ray 01/02/2020 FINDINGS: The cardiac silhouette, mediastinal and hilar contours are stable. Stable surgical changes from triple bypass surgery. Stable tortuosity and calcification of the thoracic aorta. Persistent small loculated appearing left pleural fluid collection and significant overlying left lower lobe atelectasis or infiltrate. Persistent peribronchial thickening and increased interstitial markings suggesting bronchitis. No right-sided pleural effusion or right-sided infiltrate. IMPRESSION: 1. Persistent loculated appearing left pleural fluid collection and overlying left lower lobe atelectasis or infiltrate. 2. Persistent peribronchial thickening and increased interstitial markings suggesting bronchitis. Electronically Signed   By: Marijo Sanes M.D.   On: 01/09/2020 15:27   DG Chest 2 View  Result Date: 01/02/2020 CLINICAL DATA:  Shortness of breath. EXAM: CHEST - 2 VIEW COMPARISON:  05/30/2019 FINDINGS: The heart size and pulmonary vascularity are normal. Aortic atherosclerosis. CABG. There is a moderate left pleural effusion which appears loculated posteriorly and laterally. The patient had only a tiny left effusion on the prior exam. There is a masslike density at the right base which probably represents atelectatic lung surrounded by the loculated effusion but I can not exclude a new mass. The right lung is clear. Bones are normal. IMPRESSION: 1. Moderate loculated left pleural effusion significantly increased since 05/30/2019. 2. Masslike density at the right base probably represents atelectatic lung surrounded by the loculated effusion. However, I can not exclude a new mass. 3.  Aortic Atherosclerosis (ICD10-I70.0). Electronically Signed   By: Lorriane Shire M.D.   On: 01/02/2020 14:36   ECHOCARDIOGRAM COMPLETE  Result Date:  01/02/2020    ECHOCARDIOGRAM REPORT   Patient Name:   ZYIRE EIDSON Date of Exam: 01/02/2020 Medical Rec #:  [id number]         Height:       74.0 in Accession #:    [id number]        Weight:       212.0 lb Date of Birth:  1951/07/09        BSA:          2.23 m Patient Age:    55 years          BP:           150/78 mmHg Patient Gender: M                 HR:           83 bpm. Exam Location:  [facility] Procedure: 2D Echo, Cardiac Doppler, Color Doppler and Strain Analysis Indications:    Dyspnea  History:        Patient has prior history of Echocardiogram examinations, most                 recent 03/25/2019. Previous Myocardial Infarction, CKD; Risk                 Factors:Hypertension, Diabetes and Dyslipidemia.  Sonographer:    Cardell Peach RDCS (AE) Referring Phys: [place]  1. Left ventricular ejection fraction, by estimation, is 40 to 45%. The left ventricle has moderately decreased function. The left ventrical demonstrates global hypokinesis. There is moderately increased left ventricular hypertrophy. Left ventricular diastolic parameters are consistent with Grade II diastolic dysfunction (pseudonormalization). Elevated left ventricular pressure. The average left ventricular global longitudinal strain is -11.8 %.  2. Right ventricular systolic function is normal. The right ventricular size is mildly enlarged. There is moderately elevated  pulmonary artery systolic pressure.  3. Moderate mitral valve regurgitation.  4. Aortic valve vegetation is visualized on the noncoronary cusp.  5. The aortic valve is abnormal. Aortic valve regurgitation is not visualized. No aortic stenosis is present.  6. There is moderate dilatation of the ascending aorta measuring 43 mm.  7. The inferior vena cava is dilated in size with >50% respiratory variability, suggesting right atrial pressure of 8 mmHg. FINDINGS  Left Ventricle: Left ventricular ejection fraction, by estimation, is 40 to 45%. The left  ventricle has moderately decreased function. The left ventricle demonstrates global hypokinesis. The average left ventricular global longitudinal strain is -11.8 %. The left ventricular internal cavity size was normal in size. There is moderately increased left ventricular hypertrophy. Concentric remodeling left ventricular hypertrophy. Left ventricular diastolic parameters are consistent with Grade II diastolic dysfunction (pseudonormalization). Right Ventricle: The right ventricular size is mildly enlarged. No increase in right ventricular wall thickness. Right ventricular systolic function is normal. There is moderately elevated pulmonary artery systolic pressure. The tricuspid regurgitant velocity is 2.99 m/s, and with an assumed right atrial pressure of 8 mmHg, the estimated right ventricular systolic pressure is 69.6 mmHg. Left Atrium: Left atrial size was normal in size. Right Atrium: Right atrial size was normal in size. Pericardium: There is no evidence of pericardial effusion. Mitral Valve: The mitral valve is normal in structure and function. Normal mobility of the mitral valve leaflets. Moderate mitral valve regurgitation. No evidence of mitral valve stenosis. Tricuspid Valve: The tricuspid valve is normal in structure. Tricuspid valve regurgitation is not demonstrated. No evidence of tricuspid stenosis. Aortic Valve: The aortic valve is abnormal. . There is mild thickening of the non coronary cusp of the aortic valve. Aortic valve regurgitation is not visualized. No aortic stenosis is present. There is mild thickening of the aortic valve. A mobile vegetation is seen on the noncoronary cusp. The AoV vegetation measures 2.5 mm x 4.5 mm. Pulmonic Valve: The pulmonic valve was normal in structure. Pulmonic valve regurgitation is not visualized. No evidence of pulmonic stenosis. Aorta: The aortic root is normal in size and structure. There is moderate dilatation of the ascending aorta measuring 43 mm.  Venous: A systolic blunting flow pattern is recorded from the right upper pulmonary vein. The inferior vena cava is dilated in size with greater than 50% respiratory variability, suggesting right atrial pressure of 8 mmHg. The inferior vena cava and the hepatic vein show a normal flow pattern. IAS/Shunts: No atrial level shunt detected by color flow Doppler.  LEFT VENTRICLE PLAX 2D LVIDd:         5.64 cm  Diastology LVIDs:         4.22 cm  LV e' lateral:   8.05 cm/s LV PW:         1.39 cm  LV E/e' lateral: 11.9 LV IVS:        1.40 cm  LV e' medial:    6.96 cm/s LVOT diam:     2.20 cm  LV E/e' medial:  13.7 LV SV:         72.23 ml LV SV Index:   33.91    2D Longitudinal Strain LVOT Area:     3.80 cm 2D Strain GLS Avg:     -11.8 %  RIGHT VENTRICLE             IVC RV Basal diam:  4.73 cm     IVC diam: 2.37 cm RV S prime:  11.10 cm/s TAPSE (M-mode): 2.0 cm LEFT ATRIUM             Index       RIGHT ATRIUM           Index LA diam:        4.60 cm 2.06 cm/m  RA Area:     20.60 cm LA Vol (A2C):   72.3 ml 32.45 ml/m RA Volume:   62.70 ml  28.14 ml/m LA Vol (A4C):   73.4 ml 32.94 ml/m LA Biplane Vol: 73.7 ml 33.08 ml/m  AORTIC VALVE LVOT Vmax:   91.65 cm/s LVOT Vmean:  60.700 cm/s LVOT VTI:    0.190 m  AORTA Ao Root diam: 3.50 cm Ao Asc diam:  4.30 cm MITRAL VALVE                        TRICUSPID VALVE MV Area (PHT): 3.99 cm             TR Peak grad:   35.8 mmHg MV Decel Time: 190 msec             TR Vmax:        299.00 cm/s MV E velocity: 95.40 cm/s 103 cm/s MV A velocity: 77.10 cm/s 70.3 cm/s SHUNTS MV E/A ratio:  1.24       1.5       Systemic VTI:  0.19 m                                     Systemic Diam: 2.20 cm Shirlee More MD Electronically signed by Shirlee More MD Signature Date/Time: 01/02/2020/12:52:52 PM    Final    ECHO TEE  Result Date: 01/12/2020    TRANSESOPHOGEAL ECHO REPORT   Patient Name:   Elfego IGNATIUS KLOOS Date of Exam: 01/12/2020 Medical Rec #:  [id number]         Height:       72.2 in Accession  #:    [id number]        Weight:       212.0 lb Date of Birth:  [birth year]        BSA:          2.19 m Patient Age:    49 years          BP:           150/77 mmHg Patient Gender: M                 HR:           86 bpm. Exam Location:  Inpatient Procedure: Transesophageal Echo, Cardiac Doppler and Color Doppler Indications:     Bacteremia 790.7  History:         Patient has prior history of Echocardiogram examinations, most                  recent 01/02/2020. Cardiomyopathy, Prior CABG, Endocarditis,                  Arrythmias:non-specific ST changes, Signs/Symptoms:Chest Pain;                  Risk Factors:Hypertension, Diabetes, Dyslipidemia and Former                  Smoker. Pleural effusion.  Sonographer:     Vickie Epley RDCS Referring Phys:  [phone]  MIHAI CROITORU Diagnosing Phys: Sanda Klein MD PROCEDURE: The transesophogeal probe was passed without difficulty through the esophogus of the patient. Local oropharyngeal anesthetic was provided with Cetacaine. Sedation performed by performing physician. Patients was under conscious sedation during this procedure. Anesthetic administered: 64mcg of Fentanyl, 4.0mg  of Versed. The patient's vital signs; including heart rate, blood pressure, and oxygen saturation; remained stable throughout the procedure. The patient developed no complications during the procedure. IMPRESSIONS  1. Left ventricular ejection fraction, by estimation, is 40 to 45%. The left ventricle has mildly decreased function. The left ventricle demonstrates regional wall motion abnormalities (see scoring diagram/findings for description). There is mild concentric left ventricular hypertrophy. Left ventricular diastolic function could not be evaluated.  2. Right ventricular systolic function is normal. The right ventricular size is normal.  3. No left atrial/left atrial appendage thrombus was detected.  4. The mitral valve is normal in structure and function. Mild mitral valve regurgitation. No evidence  of mitral stenosis.  5. The aortic valve is normal in structure and function. Aortic valve regurgitation is not visualized. Mild aortic valve sclerosis is present, with no evidence of aortic valve stenosis.  6. The inferior vena cava is normal in size with greater than 50% respiratory variability, suggesting right atrial pressure of 3 mmHg. Conclusion(s)/Recomendation(s): No evidence of vegetation/infective endocarditis on this transesophageal echocardiogram. FINDINGS  Left Ventricle: Left ventricular ejection fraction, by estimation, is 40 to 45%. The left ventricle has mildly decreased function. The left ventricle demonstrates regional wall motion abnormalities. The left ventricular internal cavity size was normal in size. There is mild concentric left ventricular hypertrophy. Left ventricular diastolic function could not be evaluated.  LV Wall Scoring: The entire inferior wall is hypokinetic. Right Ventricle: The right ventricular size is normal. No increase in right ventricular wall thickness. Right ventricular systolic function is normal. Left Atrium: Left atrial size was normal in size. No left atrial/left atrial appendage thrombus was detected. Right Atrium: Right atrial size was normal in size. Pericardium: There is no evidence of pericardial effusion. Mitral Valve: The mitral valve is normal in structure and function. Normal mobility of the mitral valve leaflets. Mild mitral valve regurgitation. No evidence of mitral valve stenosis. Tricuspid Valve: The tricuspid valve is normal in structure. Tricuspid valve regurgitation is mild . No evidence of tricuspid stenosis. Aortic Valve: There is focal calcification of the non-coronary cusp. The aortic valve is normal in structure and function. Aortic valve regurgitation is not visualized. Mild aortic valve sclerosis is present, with no evidence of aortic valve stenosis. Pulmonic Valve: The pulmonic valve was normal in structure. Pulmonic valve regurgitation is  trivial. No evidence of pulmonic stenosis. Aorta: The aortic root is normal in size and structure. Venous: The inferior vena cava is normal in size with greater than 50% respiratory variability, suggesting right atrial pressure of 3 mmHg. IAS/Shunts: No atrial level shunt detected by color flow Doppler. Sanda Klein MD Electronically signed by Sanda Klein MD Signature Date/Time: 01/12/2020/2:05:50 PM    Final     Lab Results:  CBC    Component Value Date/Time   WBC 7.9 01/08/2020 1630   WBC 7.9 04/03/2019 0248   RBC 3.78 (L) 01/08/2020 1630   RBC 2.69 (L) 04/03/2019 0248   HGB 10.2 (L) 01/08/2020 1630   HCT 32.0 (L) 01/08/2020 1630   PLT 227 01/08/2020 1630   MCV 85 01/08/2020 1630   MCH 27.0 01/08/2020 1630   MCH 30.1 04/03/2019 0248   MCHC 31.9 01/08/2020 1630  MCHC 32.5 04/03/2019 0248   RDW 14.3 01/08/2020 1630   LYMPHSABS 1.3 01/08/2020 1630   MONOABS 1.1 (H) 09/13/2017 0744   EOSABS 0.2 01/08/2020 1630   BASOSABS 0.0 01/08/2020 1630    BMET    Component Value Date/Time   NA 140 01/09/2020 1012   K 5.2 01/09/2020 1012   CL 107 (H) 01/09/2020 1012   CO2 18 (L) 01/09/2020 1012   GLUCOSE 105 (H) 01/09/2020 1012   GLUCOSE 161 (H) 04/04/2019 0312   BUN 48 (H) 01/09/2020 1012   CREATININE 4.51 (H) 01/09/2020 1012   CALCIUM 9.7 01/09/2020 1012   GFRNONAA 13 (L) 01/09/2020 1012   GFRAA 15 (L) 01/09/2020 1012    BNP No results found for: BNP  ProBNP No results found for: PROBNP  Specialty Problems      Pulmonary Problems   Pleural effusion on left    Status post cardiac surgery in May/2020 1 thoracentesis on 01/09/20  01/09/2020-left thoracentesis-600 cc of bloody fluid removed, patient c/O pleurisy 01/09/2020-LD body fluid 219 01/09/2020-protein 2.9 01/09/2020-glucose-25 01/09/2020-body fluid cell count-color red, turbid, lymphs 57%          Allergies  Allergen Reactions  . Penicillins Rash and Other (See Comments)    Did it involve swelling of the  face/tongue/throat, SOB, or low BP? No Did it involve sudden or severe rash/hives, skin peeling, or any reaction on the inside of your mouth or nose? Yes Did you need to seek medical attention at a hospital or doctor's office? Yes When did it last happen?Childhood allergy If all above answers are "NO", may proceed with cephalosporin use.     Immunization History  Administered Date(s) Administered  . Influenza Inj Mdck Quad Pf 08/24/2018  . Influenza, High Dose Seasonal PF 10/03/2019  . Influenza,inj,Quad PF,6+ Mos 09/08/2017    Past Medical History:  Diagnosis Date  . Diabetes mellitus without complication ([place])   . Hypertension   . MI (myocardial infarction) ([place])   . Renal disorder     Tobacco History: Social History   Tobacco Use  Smoking Status Former Smoker  . Packs/day: 2.00  . Years: 30.00  . Pack years: 60.00  . Types: Cigarettes  . Start date: 17  . Quit date: 36  . Years since quitting: 23.2  Smokeless Tobacco Never Used   Counseling given: Yes   Continue to not smoke  Outpatient Encounter Medications as of 01/30/2020  Medication Sig  . acetaminophen (TYLENOL) 500 MG tablet Take 1,500 mg by mouth 2 (two) times daily as needed (pain).  Marland Kitchen albuterol (VENTOLIN HFA) 108 (90 Base) MCG/ACT inhaler Inhale 2 puffs into the lungs every 6 (six) hours as needed for wheezing or shortness of breath.  Marland Kitchen amLODipine (NORVASC) 5 MG tablet Take 5 mg by mouth daily.  Marland Kitchen ascorbic acid (VITAMIN C) 500 MG tablet Take 500 mg by mouth daily.  Marland Kitchen aspirin EC 81 MG tablet Take 81 mg by mouth daily.  Marland Kitchen atorvastatin (LIPITOR) 40 MG tablet Take 40 mg by mouth daily in the afternoon.   . calcitRIOL (ROCALTROL) 0.5 MCG capsule Take 0.5 mcg by mouth daily.  . Dulaglutide (TRULICITY) 1.5 GG/2.6RS SOPN Inject 1.5 mg into the skin every Sunday.  . ferrous sulfate 325 (65 FE) MG tablet Take 325 mg by mouth 2 (two) times daily with a meal.  . fluticasone (FLONASE) 50 MCG/ACT nasal spray  Place 1 spray into both nostrils daily as needed for allergies or rhinitis.  Marland Kitchen gabapentin (NEURONTIN) 100  MG capsule Take 2 capsules (200 mg total) by mouth at bedtime.  Marland Kitchen glipiZIDE (GLUCOTROL XL) 2.5 MG 24 hr tablet Take 2.5 mg by mouth 2 (two) times daily.  . hydrALAZINE (APRESOLINE) 10 MG tablet Take 1 tablet (10 mg total) by mouth 3 (three) times daily.  Marland Kitchen linagliptin (TRADJENTA) 5 MG TABS tablet Take 5 mg by mouth daily.  . metoprolol succinate (TOPROL-XL) 50 MG 24 hr tablet Take 50 mg by mouth daily.  Marland Kitchen OVER THE COUNTER MEDICATION Place 1 drop into both eyes 2 (two) times daily as needed (dry eyes). Hyper Tears eye drops  . rOPINIRole (REQUIP) 0.25 MG tablet Take 0.25 mg by mouth at bedtime.  . sevelamer carbonate (RENVELA) 800 MG tablet Take 1 tablet (800 mg total) by mouth 3 (three) times daily with meals.  . tamsulosin (FLOMAX) 0.4 MG CAPS capsule Take 0.4 mg by mouth daily before supper.  . zolpidem (AMBIEN) 10 MG tablet Take 10 mg by mouth at bedtime.  . [DISCONTINUED] doxycycline (VIBRA-TABS) 100 MG tablet Take 1 tablet (100 mg total) by mouth 2 (two) times daily.   No facility-administered encounter medications on file as of 01/30/2020.     Review of Systems  Review of Systems  Constitutional: Positive for fatigue. Negative for activity change, chills, fever and unexpected weight change.  HENT: Negative for postnasal drip, rhinorrhea, sinus pressure, sinus pain and sore throat.   Eyes: Negative.   Respiratory: Positive for cough and shortness of breath. Negative for wheezing.   Cardiovascular: Negative for chest pain and palpitations.  Gastrointestinal: Negative for constipation, diarrhea, nausea and vomiting.  Endocrine: Negative.   Genitourinary: Negative.   Musculoskeletal: Negative.   Skin: Negative.   Neurological: Negative for dizziness and headaches.  Psychiatric/Behavioral: Negative.  Negative for dysphoric mood. The patient is not nervous/anxious.   All other  systems reviewed and are negative.    Physical Exam  BP 138/72   Pulse 84   Temp 97.6 F (36.4 C) (Temporal)   Ht 6' 0.25" (1.835 m)   Wt 204 lb (92.5 kg)   SpO2 98%   BMI 27.48 kg/m   Wt Readings from Last 5 Encounters:  01/30/20 204 lb (92.5 kg)  01/09/20 212 lb (96.2 kg)  01/09/20 211 lb 6.4 oz (95.9 kg)  12/27/19 212 lb (96.2 kg)  07/12/19 207 lb (93.9 kg)    BMI Readings from Last 5 Encounters:  01/30/20 27.48 kg/m  01/09/20 28.55 kg/m  01/09/20 27.14 kg/m  12/27/19 27.22 kg/m  07/12/19 26.58 kg/m     Physical Exam Vitals and nursing note reviewed.  Constitutional:      General: He is not in acute distress.    Appearance: Normal appearance. He is normal weight.  HENT:     Head: Normocephalic and atraumatic.     Right Ear: Hearing and external ear normal.     Left Ear: Hearing and external ear normal.     Nose: Nose normal. No mucosal edema.     Right Turbinates: Not enlarged.     Left Turbinates: Not enlarged.  Eyes:     Pupils: Pupils are equal, round, and reactive to light.  Cardiovascular:     Rate and Rhythm: Normal rate and regular rhythm.     Pulses: Normal pulses.     Heart sounds: Normal heart sounds. No murmur.  Pulmonary:     Effort: Pulmonary effort is normal.     Breath sounds: Examination of the left-lower field reveals decreased breath sounds.  Decreased breath sounds present. No wheezing or rales.     Comments: Left pleural effusion also evaluated via ultrasound at bedside with Dr. Tamala Julian Musculoskeletal:     Cervical back: Normal range of motion.     Right lower leg: No edema.     Left lower leg: No edema.  Lymphadenopathy:     Cervical: No cervical adenopathy.  Skin:    General: Skin is warm and dry.     Capillary Refill: Capillary refill takes less than 2 seconds.     Findings: No erythema or rash.  Neurological:     General: No focal deficit present.     Mental Status: He is alert and oriented to person, place, and time.      Motor: No weakness.     Coordination: Coordination normal.     Gait: Gait is intact. Gait normal.  Psychiatric:        Mood and Affect: Mood normal.        Behavior: Behavior normal. Behavior is cooperative.        Thought Content: Thought content normal.        Judgment: Judgment normal.       Assessment & Plan:   Pleural effusion on left Persistent left loculated pleural effusion Evaluated at bedside with ultrasound today with Dr. Tamala Julian  Discussion: Patient is status post 1 thoracentesis on 01/09/2020.  Moderate symptom relief for 4 days.  Significant pleurisy during this 4 days after the procedure.  Due to patient's increased pleurisy as well as symptoms status post thoracentesis Dr. Tamala Julian and I both agree that the best to see if he could potentially have this left loculated effusion cleared surgically.  To help avoid additional thoracentesis in the future.  Patient agrees.  Plan: We will order CT chest without contrast We will refer to cardiothoracic surgery -Dr. Roxan Hockey 4 to 6-week follow-up with Dr. Tamala Julian    Return in about 6 weeks (around 03/12/2020), or if symptoms worsen or fail to improve, for Follow up with Dr. Tamala Julian.   Lauraine Rinne, NP 01/30/2020   This appointment required 32 minutes of patient care (this includes precharting, chart review, review of results, face-to-face care, etc.).

## 2020-01-30 NOTE — Patient Instructions (Addendum)
You were seen today by Lauraine Rinne, NP  for:   It was a pleasure meeting you today.  Thank you for your patience.  We got an x-ray and Dr. Tamala Julian also evaluated you with an ultrasound.  We both agree that we need to get you over to cardiothoracic surgery Dr. Roxan Hockey.  I have also ordered a CT to get a better evaluation of your chest x-ray.  Please let me know if you have not been scheduled with cardiothoracic surgery or scheduled for a CT sometime over the next 7 days.  Stay safe and take care,  John [name]  1. Pleural effusion on left  - DG Chest 2 View; Future - Ambulatory referral to Cardiothoracic Surgery - CT Chest Wo Contrast; Future   We will refer you to Dr. Roxan Hockey for further evaluation    We recommend today:  Orders Placed This Encounter  Procedures  . DG Chest 2 View    Standing Status:   Future    Number of Occurrences:   1    Standing Expiration Date:   03/31/2021    Order Specific Question:   Reason for Exam (SYMPTOM  OR DIAGNOSIS REQUIRED)    Answer:   recent abx, pleural effusion    Order Specific Question:   Preferred imaging location?    Answer:   [facility]    Order Specific Question:   Radiology Contrast Protocol - do NOT remove file path    Answer:   [email or web address]  . CT Chest Wo Contrast    Standing Status:   Future    Standing Expiration Date:   03/31/2021    Scheduling Instructions:     Complete by 02/05/20    Order Specific Question:   Preferred imaging location?    Answer:   [place]    Order Specific Question:   Radiology Contrast Protocol - do NOT remove file path    Answer:   [email or web address]  . Ambulatory referral to Cardiothoracic Surgery    Referral Priority:   Urgent    Referral Type:   Surgical    Referral Reason:   Specialty Services Required    Requested Specialty:   Cardiothoracic Surgery    Number of Visits Requested:   1   Orders Placed This  Encounter  Procedures  . DG Chest 2 View  . CT Chest Wo Contrast  . Ambulatory referral to Cardiothoracic Surgery   No orders of the defined types were placed in this encounter.   Follow Up:    Return in about 6 weeks (around 03/12/2020), or if symptoms worsen or fail to improve, for Follow up with Dr. Tamala Julian.   Please do your part to reduce the spread of COVID-19:      Reduce your risk of any infection  and COVID19 by using the similar precautions used for avoiding the common cold or flu:  Marland Kitchen Wash your hands often with soap and warm water for at least 20 seconds.  If soap and water are not readily available, use an alcohol-based hand sanitizer with at least 60% alcohol.  . If coughing or sneezing, cover your mouth and nose by coughing or sneezing into the elbow areas of your shirt or coat, into a tissue or into your sleeve (not your hands). John [name] A MASK when in public  . Avoid shaking hands with others and consider head nods or verbal greetings only. . Avoid touching your eyes, nose, or mouth with unwashed hands.  Marland Kitchen  Avoid close contact with people who are sick. . Avoid places or events with large numbers of people in one location, like concerts or sporting events. . If you have some symptoms but not all symptoms, continue to monitor at home and seek medical attention if your symptoms worsen. . If you are having a medical emergency, call 911.   [place] / e-Visit: [email or web address]         [facility]: [facility]: [phone]                   [facility]: [phone]     It is flu season:   >>> Best ways to protect herself from the flu: Receive the yearly flu vaccine, practice good hand hygiene washing with soap and also using hand sanitizer when available, eat a nutritious meals, get adequate rest, hydrate  appropriately   Please contact the office if your symptoms worsen or you have concerns that you are not improving.   Thank you for choosing [facility] for your healthcare, and for allowing [place] to partner with you on your healthcare journey. I am thankful to be able to provide care to you today.   Wyn Quaker FNP-C

## 2020-01-30 NOTE — Assessment & Plan Note (Addendum)
Persistent left loculated pleural effusion Evaluated at bedside with ultrasound today with Dr. Tamala Julian  Discussion: Patient is status post 1 thoracentesis on 01/09/2020.  Moderate symptom relief for 4 days.  Significant pleurisy during this 4 days after the procedure.  Due to patient's increased pleurisy as well as symptoms status post thoracentesis Dr. Tamala Julian and I both agree that the best to see if he could potentially have this left loculated effusion cleared surgically.  To help avoid additional thoracentesis in the future.  Patient agrees.  Plan: We will order CT chest without contrast We will refer to cardiothoracic surgery -Dr. Roxan Hockey 4 to 6-week follow-up with Dr. Tamala Julian

## 2020-01-31 ENCOUNTER — Other Ambulatory Visit: Payer: Self-pay | Admitting: Cardiology

## 2020-01-31 NOTE — Progress Notes (Signed)
Attending addendum:  Seen and examined.  Tap in past resulted in partial breathing relief but also significant pleurisy suggesting entrapment.  Moderate effusion on [place] with lots of septations  If we think effusion contributing significantly to ongoing dyspnea, thoracenteses will not be effective. Would ask TCTS to re-eval and determine if this should be considered for decortication or left alone given comorbidities.  Erskine Emery MD PCM

## 2020-02-02 ENCOUNTER — Ambulatory Visit (INDEPENDENT_AMBULATORY_CARE_PROVIDER_SITE_OTHER)
Admission: RE | Admit: 2020-02-02 | Discharge: 2020-02-02 | Disposition: A | Discharge status: Home / Self Care | Payer: Medicare HMO | Source: Ambulatory Visit | Attending: Pulmonary Disease | Admitting: Pulmonary Disease

## 2020-02-02 ENCOUNTER — Other Ambulatory Visit: Payer: Self-pay

## 2020-02-02 DIAGNOSIS — J90 Pleural effusion, not elsewhere classified: Secondary | ICD-10-CM

## 2020-02-07 ENCOUNTER — Other Ambulatory Visit: Payer: Self-pay

## 2020-02-07 ENCOUNTER — Encounter: Payer: Self-pay | Admitting: Thoracic Surgery (Cardiothoracic Vascular Surgery)

## 2020-02-07 ENCOUNTER — Institutional Professional Consult (permissible substitution): Payer: Medicare HMO | Admitting: Thoracic Surgery (Cardiothoracic Vascular Surgery)

## 2020-02-07 VITALS — BP 150/72 | HR 90 | Temp 97.9°F | Resp 20 | Ht 72.0 in | Wt 204.0 lb

## 2020-02-07 DIAGNOSIS — J90 Pleural effusion, not elsewhere classified: Secondary | ICD-10-CM

## 2020-02-07 NOTE — H&P (View-Only) (Signed)
PCP is Imagene Riches, NP Referring Provider is Lauraine Rinne, NP  Chief Complaint  Patient presents with  . Pleural Effusion    Surgical eval, Chest CT 02/02/20, Hx of CABG 03/2019    HPI: John [name] is sent for consultation regarding a left pleural effusion.  John [name] is a 69 year old man with a past medical history significant for coronary artery disease, non-ST elevation MI, coronary bypass grafting x4 in May 2020, stage V chronic kidney disease, hypertension, hyperlipidemia, type 2 diabetes with renal complications, and low back pain.  He had coronary bypass grafting in May 2020.  On a postoperative follow-up he had a small left pleural effusion.  He was treated with steroids.  A follow-up in July showed near complete resolution with only minimal residual layering effusion.  He states that he had Covid shortly after that  Recently he has developed shortness of breath with exertion, although there are not any records indicate that.  He saw Dr. Agustin Cree in February.  He complained that he had been getting progressively more short of breath over the past several months.  He was noted to have decreased breath sounds on the left side.  A chest x-ray showed a loculated left pleural effusion.  An echocardiogram suggested possible endocarditis.  His ejection fraction was 40 to 45%.  A transesophageal echocardiogram later showed no evidence of endocarditis.  He was referred to Dr. Erskine Emery.  Thoracentesis was performed which drained 600 mL of fluid.  The fluid was bloody and had an elevated LDH of 219 consistent with an exudate.  He had a great deal of pain after the thoracentesis.  He thinks it helped his breathing a little bit but he was hurting so much for 5 or 6 days that he is not really sure how much of an effect it had.  He is now referred for consideration for surgical intervention.  He continues to have shortness of breath with exertion.  After he catches his breath he then can  continue with his activity.  He complains of a cough when he lies down and that causes left-sided chest pain.  He is not having any anginal pain.  Zubrod Score: At the time of surgery this patient's most appropriate activity status/level should be described as: []     0    Normal activity, no symptoms [x]     1    Restricted in physical strenuous activity but ambulatory, able to do out light work []     2    Ambulatory and capable of self care, unable to do work activities, up and about >50 % of waking hours                              []     3    Only limited self care, in bed greater than 50% of waking hours []     4    Completely disabled, no self care, confined to bed or chair []     5    Moribund  Past Medical History:  Diagnosis Date  . Diabetes mellitus without complication ([place])   . Hypertension   . MI (myocardial infarction) ([place])   . Renal disorder     Past Surgical History:  Procedure Laterality Date  . ABDOMINAL AORTIC ANEURYSM REPAIR    . APPENDECTOMY    . AV FISTULA PLACEMENT Right 10/18/2018   Procedure: BRACHIO-CEPHALIC ARTERIOVENOUS (AV) FISTULA CREATION;  Surgeon: Scot Dock,  Judeth Cornfield, MD;  Location: [facility];  Service: Vascular;  Laterality: Right;  . CARDIAC CATHETERIZATION    . CORONARY ANGIOPLASTY    . CORONARY ARTERY BYPASS GRAFT N/A 03/29/2019   Procedure: CORONARY ARTERY BYPASS GRAFTING (CABG) x 4, using left internal mammary artery and right leg greater saphenous vein harvested endoscopically;  Surgeon: Melrose Nakayama, MD;  Location: [facility];  Service: Open Heart Surgery;  Laterality: N/A;  . CORONARY PRESSURE WIRE/FFR WITH 3D MAPPING N/A 03/27/2019   Procedure: Coronary Pressure Wire/FFR w/3D Mapping;  Surgeon: Jettie Booze, MD;  Location: [facility];  Service: Cardiovascular;  Laterality: N/A;  . IR FLUORO GUIDE CV LINE RIGHT  09/13/2017  . IR REMOVAL TUN CV CATH W/O FL  10/22/2017  . IR US GUIDE VASC ACCESS RIGHT  09/13/2017  . LEFT HEART CATH  AND CORONARY ANGIOGRAPHY N/A 03/27/2019   Procedure: LEFT HEART CATH AND CORONARY ANGIOGRAPHY;  Surgeon: Jettie Booze, MD;  Location: [facility];  Service: Cardiovascular;  Laterality: N/A;  . NECK SURGERY    . SMALL INTESTINE SURGERY    . TEE WITHOUT CARDIOVERSION N/A 09/10/2017   Procedure: TRANSESOPHAGEAL ECHOCARDIOGRAM (TEE);  Surgeon: Skeet Latch, MD;  Location: [facility];  Service: Cardiovascular;  Laterality: N/A;  . TEE WITHOUT CARDIOVERSION N/A 03/29/2019   Procedure: TRANSESOPHAGEAL ECHOCARDIOGRAM (TEE);  Surgeon: Melrose Nakayama, MD;  Location: [facility];  Service: Open Heart Surgery;  Laterality: N/A;  . TEE WITHOUT CARDIOVERSION N/A 01/12/2020   Procedure: TRANSESOPHAGEAL ECHOCARDIOGRAM (TEE);  Surgeon: Sanda Klein, MD;  Location: [facility];  Service: Cardiovascular;  Laterality: N/A;    Family History  Problem Relation Age of Onset  . Cancer Maternal Grandmother     Social History Social History   Tobacco Use  . Smoking status: Former Smoker    Packs/day: 2.00    Years: 30.00    Pack years: 60.00    Types: Cigarettes    Start date: 1968    Quit date: 1998    Years since quitting: 23.2  . Smokeless tobacco: Never Used  Substance Use Topics  . Alcohol use: Yes    Comment: Occasionally.  . Drug use: Never    Current Outpatient Medications  Medication Sig Dispense Refill  . acetaminophen (TYLENOL) 500 MG tablet Take 1,500 mg by mouth 2 (two) times daily as needed (pain).    Marland Kitchen albuterol (VENTOLIN HFA) 108 (90 Base) MCG/ACT inhaler Inhale 2 puffs into the lungs every 6 (six) hours as needed for wheezing or shortness of breath. 8 g 1  . amLODipine (NORVASC) 5 MG tablet Take 5 mg by mouth daily.  12  . ascorbic acid (VITAMIN C) 500 MG tablet Take 500 mg by mouth daily.    Marland Kitchen aspirin EC 81 MG tablet Take 81 mg by mouth daily.    Marland Kitchen atorvastatin (LIPITOR) 40 MG tablet Take 40 mg by mouth daily in the afternoon.     . calcitRIOL (ROCALTROL)  0.5 MCG capsule Take 0.5 mcg by mouth daily.  3  . Dulaglutide (TRULICITY) 1.5 ST/4.1DQ SOPN Inject 1.5 mg into the skin every Sunday.    . ferrous sulfate 325 (65 FE) MG tablet Take 325 mg by mouth 2 (two) times daily with a meal.    . fluticasone (FLONASE) 50 MCG/ACT nasal spray Place 1 spray into both nostrils daily as needed for allergies or rhinitis.    Marland Kitchen gabapentin (NEURONTIN) 100 MG capsule Take 2 capsules (200 mg total) by  mouth at bedtime.    Marland Kitchen glipiZIDE (GLUCOTROL XL) 2.5 MG 24 hr tablet Take 2.5 mg by mouth 2 (two) times daily.    . hydrALAZINE (APRESOLINE) 10 MG tablet TAKE 1 TABLET BY MOUTH THREE TIMES A DAY 270 tablet 3  . linagliptin (TRADJENTA) 5 MG TABS tablet Take 5 mg by mouth daily.    . metoprolol succinate (TOPROL-XL) 50 MG 24 hr tablet Take 50 mg by mouth daily.  0  . OVER THE COUNTER MEDICATION Place 1 drop into both eyes 2 (two) times daily as needed (dry eyes). Hyper Tears eye drops    . rOPINIRole (REQUIP) 0.25 MG tablet Take 0.25 mg by mouth at bedtime.    . sevelamer carbonate (RENVELA) 800 MG tablet Take 1 tablet (800 mg total) by mouth 3 (three) times daily with meals. 90 tablet 1  . tamsulosin (FLOMAX) 0.4 MG CAPS capsule Take 0.4 mg by mouth daily before supper.  2  . zolpidem (AMBIEN) 10 MG tablet Take 10 mg by mouth at bedtime.     No current facility-administered medications for this visit.    Allergies  Allergen Reactions  . Penicillins Rash and Other (See Comments)    Did it involve swelling of the face/tongue/throat, SOB, or low BP? No Did it involve sudden or severe rash/hives, skin peeling, or any reaction on the inside of your mouth or nose? Yes Did you need to seek medical attention at a hospital or doctor's office? Yes When did it last happen?Childhood allergy If all above answers are "NO", may proceed with cephalosporin use.     Review of Systems  Constitutional: Negative for activity change, fever and unexpected weight change.  HENT:  Negative for trouble swallowing and voice change.   Respiratory: Positive for cough and shortness of breath.   Cardiovascular: Positive for chest pain (Left-sided pleuritic) and leg swelling (Right ankle).  Genitourinary: Negative for difficulty urinating and dysuria.  Musculoskeletal: Positive for arthralgias, back pain, joint swelling and myalgias (Leg cramps).  Neurological: Negative for syncope and weakness.  Hematological: Bruises/bleeds easily.  All other systems reviewed and are negative.   BP (!) 150/72   Pulse 90   Temp 97.9 F (36.6 C) (Skin)   Resp 20   Ht 6' (1.829 m)   Wt 204 lb (92.5 kg)   SpO2 93% Comment: RA  BMI 27.67 kg/m  Physical Exam Vitals reviewed.  Constitutional:      General: He is not in acute distress.    Appearance: Normal appearance.  HENT:     Head: Normocephalic and atraumatic.  Eyes:     General: No scleral icterus.    Extraocular Movements: Extraocular movements intact.  Cardiovascular:     Rate and Rhythm: Normal rate and regular rhythm.     Heart sounds: Normal heart sounds. No murmur.  Pulmonary:     Effort: Pulmonary effort is normal.     Breath sounds: No wheezing.     Comments: Diminished breath sounds left base to mid lung Abdominal:     General: There is no distension.     Palpations: Abdomen is soft.  Musculoskeletal:        General: Swelling (Trace edema right ankle) present.     Cervical back: Neck supple.  Skin:    General: Skin is warm and dry.  Neurological:     General: No focal deficit present.     Mental Status: He is alert and oriented to person, place, and time.  Cranial Nerves: No cranial nerve deficit.     Motor: No weakness.    Diagnostic Tests: CT CHEST WITHOUT CONTRAST  TECHNIQUE: Multidetector CT imaging of the chest was performed following the standard protocol without IV contrast.  COMPARISON:  Radiograph 01/30/2020, additional prior radiographs reviewed. Chest CT  03/24/2019.  FINDINGS: Cardiovascular: Dense coronary artery calcifications of native coronary arteries post CABG. Atherosclerosis of the thoracic aorta with fusiform ascending aneurysmal dilatation, maximum dimension 4.3 cm. The previous broad-based saccular lesion in the distal aortic arch is grossly stable, no periaortic stranding. There is branch vessel atherosclerosis. Mild cardiomegaly without pericardial effusion.  Mediastinum/Nodes: Multiple prominent lymph nodes, all subcentimeter and majority are stable from prior exam. An 8 mm lower paratracheal node has slightly increased from prior. Limited assessment for hilar adenopathy in the absence of IV contrast. No visualized thyroid nodule. No esophageal wall thickening.  Lungs/Pleura: Moderate size left pleural effusion is loculated and tracks anteriorly. There is also pleural thickening dependently. Left lower lobe consolidation is likely in part compressive, with probable round atelectasis superiorly. Lobulated area of consolidation in the lingula adjacent to pleural effusion may also represent round atelectasis, but margins are slightly irregular and is nonspecific, series 3, image 83. Multiple small nodules are reticulonodular opacities at the left lung apex. There are innumerable tiny nodules throughout the right lung. These nodules are all in the 3-6 mm range trachea and mainstem bronchi are patent.  Upper Abdomen: Atherosclerosis of upper abdominal vasculature. Scarring of the included kidneys. No acute findings. No adrenal nodule.  Musculoskeletal: Median sternotomy. There are no acute or suspicious osseous abnormalities. No focal bone lesion. Subcortical cyst in the left proximal humerus also present on prior exam.  IMPRESSION: 1. Moderate loculated left pleural effusion with pleural thickening. Left lower lobe consolidation is likely in part compressive, with probable round atelectasis superiorly.  Additional area of subpleural opacity in the lingula may represent atelectasis, however has slightly spiculated margins. Pneumonia or malignancy is not excluded. Recommend correlation with recent thoracentesis results. 2. Innumerable tiny nodules throughout the right lung, all in the 3-6 mm range. This is likely infectious or inflammatory, however metastatic disease is not entirely excluded. 3. Unchanged saccular lesion in the distal aortic arch. No evidence of acute aortic inflammation on noncontrast exam. 4. Multiple prominent mediastinal lymph nodes, likely reactive but nonspecific. 5. Dense coronary artery calcifications post CABG. 6. Fusiform ascending aortic aneurysm. Recommend annual imaging followup by CTA or MRA. This recommendation follows 2010 ACCF/AHA/AATS/ACR/ASA/SCA/SCAI/SIR/STS/SVM Guidelines for the Diagnosis and Management of Patients with Thoracic Aortic Disease. Circulation. 2010; 121: T267-T245. Aortic aneurysm NOS (ICD10-I71.9)  Aortic Atherosclerosis (ICD10-I70.0).   Electronically Signed   By: Keith Rake M.D.   On: 02/02/2020 15:39 I personally reviewed the chest x-ray images and concur with the findings noted above  Impression: John [name] is a 69 year old gentleman a past medical history significant for coronary artery disease, non-ST elevation MI, coronary bypass grafting x4 in May 2020, stage V chronic kidney disease, hypertension, hyperlipidemia, type 2 diabetes with renal complications, and low back pain.  He now presents with a loculated left pleural effusion.  Although the effusion is moderate in size there is significant underlying consolidation/atelectasis in the left lung particularly in the lower lobe.  I think this is likely the source of his shortness of breath.  He had a lot of pain after his thoracentesis, which limited his relief from that procedure.  It is possible that there are other sources contributing to his dyspnea.  This  would be extremely unusual appearance for a post CABG pleural effusion.  He had almost no effusion 2 months postoperatively.  This is likely related to pneumonia. I recommended that we do bronchoscopy to rule out any underlying malignancy, although I think that is unlikely and most of the pulmonary changes are likely consolidation/atelectasis.  I recommended that we do a bronchoscopy and left video-assisted thoracoscopy for drainage of the pleural effusion and decortication of the lung.  I informed him of the general nature of the procedure including the need for general anesthesia, the incisions to be used, the use of drainage tubes postoperatively, the expected hospital stay, and the overall recovery.  I informed him of the indications, risk, benefits, and alternatives.  He understands the risks include, but not limited to death, MI, DVT, PE, bleeding, possible need for transfusion, infection, prolonged air leak, cardiac arrhythmias, as well as the possibility of other unforeseeable complications.  He wishes to proceed with surgery but says he needs to make some arrangements before scheduling.  He will call us to schedule in the next couple of weeks.  Plan: Bronchoscopy and left VATS to drain pleural effusion and decorticate the lung. Patient will call to schedule.  Melrose Nakayama, MD [facility] [phone]

## 2020-02-07 NOTE — Progress Notes (Signed)
PCP is Imagene Riches, NP Referring Provider is Lauraine Rinne, NP  Chief Complaint  Patient presents with  . Pleural Effusion    Surgical eval, Chest CT 02/02/20, Hx of CABG 03/2019    HPI: John [name] is sent for consultation regarding a left pleural effusion.  John [name] is a 69 year old man with a past medical history significant for coronary artery disease, non-ST elevation MI, coronary bypass grafting x4 in May 2020, stage V chronic kidney disease, hypertension, hyperlipidemia, type 2 diabetes with renal complications, and low back pain.  He had coronary bypass grafting in May 2020.  On a postoperative follow-up he had a small left pleural effusion.  He was treated with steroids.  A follow-up in July showed near complete resolution with only minimal residual layering effusion.  He states that he had Covid shortly after that  Recently he has developed shortness of breath with exertion, although there are not any records indicate that.  He saw Dr. Agustin Cree in February.  He complained that he had been getting progressively more short of breath over the past several months.  He was noted to have decreased breath sounds on the left side.  A chest x-ray showed a loculated left pleural effusion.  An echocardiogram suggested possible endocarditis.  His ejection fraction was 40 to 45%.  A transesophageal echocardiogram later showed no evidence of endocarditis.  He was referred to Dr. Erskine Emery.  Thoracentesis was performed which drained 600 mL of fluid.  The fluid was bloody and had an elevated LDH of 219 consistent with an exudate.  He had a great deal of pain after the thoracentesis.  He thinks it helped his breathing a little bit but he was hurting so much for 5 or 6 days that he is not really sure how much of an effect it had.  He is now referred for consideration for surgical intervention.  He continues to have shortness of breath with exertion.  After he catches his breath he then can  continue with his activity.  He complains of a cough when he lies down and that causes left-sided chest pain.  He is not having any anginal pain.  Zubrod Score: At the time of surgery this patient's most appropriate activity status/level should be described as: []     0    Normal activity, no symptoms [x]     1    Restricted in physical strenuous activity but ambulatory, able to do out light work []     2    Ambulatory and capable of self care, unable to do work activities, up and about >50 % of waking hours                              []     3    Only limited self care, in bed greater than 50% of waking hours []     4    Completely disabled, no self care, confined to bed or chair []     5    Moribund  Past Medical History:  Diagnosis Date  . Diabetes mellitus without complication ([place])   . Hypertension   . MI (myocardial infarction) ([place])   . Renal disorder     Past Surgical History:  Procedure Laterality Date  . ABDOMINAL AORTIC ANEURYSM REPAIR    . APPENDECTOMY    . AV FISTULA PLACEMENT Right 10/18/2018   Procedure: BRACHIO-CEPHALIC ARTERIOVENOUS (AV) FISTULA CREATION;  Surgeon: Scot Dock,  Judeth Cornfield, MD;  Location: [facility];  Service: Vascular;  Laterality: Right;  . CARDIAC CATHETERIZATION    . CORONARY ANGIOPLASTY    . CORONARY ARTERY BYPASS GRAFT N/A 03/29/2019   Procedure: CORONARY ARTERY BYPASS GRAFTING (CABG) x 4, using left internal mammary artery and right leg greater saphenous vein harvested endoscopically;  Surgeon: Melrose Nakayama, MD;  Location: [facility];  Service: Open Heart Surgery;  Laterality: N/A;  . CORONARY PRESSURE WIRE/FFR WITH 3D MAPPING N/A 03/27/2019   Procedure: Coronary Pressure Wire/FFR w/3D Mapping;  Surgeon: Jettie Booze, MD;  Location: [facility];  Service: Cardiovascular;  Laterality: N/A;  . IR FLUORO GUIDE CV LINE RIGHT  09/13/2017  . IR REMOVAL TUN CV CATH W/O FL  10/22/2017  . IR US GUIDE VASC ACCESS RIGHT  09/13/2017  . LEFT HEART CATH  AND CORONARY ANGIOGRAPHY N/A 03/27/2019   Procedure: LEFT HEART CATH AND CORONARY ANGIOGRAPHY;  Surgeon: Jettie Booze, MD;  Location: [facility];  Service: Cardiovascular;  Laterality: N/A;  . NECK SURGERY    . SMALL INTESTINE SURGERY    . TEE WITHOUT CARDIOVERSION N/A 09/10/2017   Procedure: TRANSESOPHAGEAL ECHOCARDIOGRAM (TEE);  Surgeon: Skeet Latch, MD;  Location: [facility];  Service: Cardiovascular;  Laterality: N/A;  . TEE WITHOUT CARDIOVERSION N/A 03/29/2019   Procedure: TRANSESOPHAGEAL ECHOCARDIOGRAM (TEE);  Surgeon: Melrose Nakayama, MD;  Location: [facility];  Service: Open Heart Surgery;  Laterality: N/A;  . TEE WITHOUT CARDIOVERSION N/A 01/12/2020   Procedure: TRANSESOPHAGEAL ECHOCARDIOGRAM (TEE);  Surgeon: Sanda Klein, MD;  Location: [facility];  Service: Cardiovascular;  Laterality: N/A;    Family History  Problem Relation Age of Onset  . Cancer Maternal Grandmother     Social History Social History   Tobacco Use  . Smoking status: Former Smoker    Packs/day: 2.00    Years: 30.00    Pack years: 60.00    Types: Cigarettes    Start date: 1968    Quit date: 1998    Years since quitting: 23.2  . Smokeless tobacco: Never Used  Substance Use Topics  . Alcohol use: Yes    Comment: Occasionally.  . Drug use: Never    Current Outpatient Medications  Medication Sig Dispense Refill  . acetaminophen (TYLENOL) 500 MG tablet Take 1,500 mg by mouth 2 (two) times daily as needed (pain).    Marland Kitchen albuterol (VENTOLIN HFA) 108 (90 Base) MCG/ACT inhaler Inhale 2 puffs into the lungs every 6 (six) hours as needed for wheezing or shortness of breath. 8 g 1  . amLODipine (NORVASC) 5 MG tablet Take 5 mg by mouth daily.  12  . ascorbic acid (VITAMIN C) 500 MG tablet Take 500 mg by mouth daily.    Marland Kitchen aspirin EC 81 MG tablet Take 81 mg by mouth daily.    Marland Kitchen atorvastatin (LIPITOR) 40 MG tablet Take 40 mg by mouth daily in the afternoon.     . calcitRIOL (ROCALTROL)  0.5 MCG capsule Take 0.5 mcg by mouth daily.  3  . Dulaglutide (TRULICITY) 1.5 LO/7.5IE SOPN Inject 1.5 mg into the skin every Sunday.    . ferrous sulfate 325 (65 FE) MG tablet Take 325 mg by mouth 2 (two) times daily with a meal.    . fluticasone (FLONASE) 50 MCG/ACT nasal spray Place 1 spray into both nostrils daily as needed for allergies or rhinitis.    Marland Kitchen gabapentin (NEURONTIN) 100 MG capsule Take 2 capsules (200 mg total) by  mouth at bedtime.    Marland Kitchen glipiZIDE (GLUCOTROL XL) 2.5 MG 24 hr tablet Take 2.5 mg by mouth 2 (two) times daily.    . hydrALAZINE (APRESOLINE) 10 MG tablet TAKE 1 TABLET BY MOUTH THREE TIMES A DAY 270 tablet 3  . linagliptin (TRADJENTA) 5 MG TABS tablet Take 5 mg by mouth daily.    . metoprolol succinate (TOPROL-XL) 50 MG 24 hr tablet Take 50 mg by mouth daily.  0  . OVER THE COUNTER MEDICATION Place 1 drop into both eyes 2 (two) times daily as needed (dry eyes). Hyper Tears eye drops    . rOPINIRole (REQUIP) 0.25 MG tablet Take 0.25 mg by mouth at bedtime.    . sevelamer carbonate (RENVELA) 800 MG tablet Take 1 tablet (800 mg total) by mouth 3 (three) times daily with meals. 90 tablet 1  . tamsulosin (FLOMAX) 0.4 MG CAPS capsule Take 0.4 mg by mouth daily before supper.  2  . zolpidem (AMBIEN) 10 MG tablet Take 10 mg by mouth at bedtime.     No current facility-administered medications for this visit.    Allergies  Allergen Reactions  . Penicillins Rash and Other (See Comments)    Did it involve swelling of the face/tongue/throat, SOB, or low BP? No Did it involve sudden or severe rash/hives, skin peeling, or any reaction on the inside of your mouth or nose? Yes Did you need to seek medical attention at a hospital or doctor's office? Yes When did it last happen?Childhood allergy If all above answers are "NO", may proceed with cephalosporin use.     Review of Systems  Constitutional: Negative for activity change, fever and unexpected weight change.  HENT:  Negative for trouble swallowing and voice change.   Respiratory: Positive for cough and shortness of breath.   Cardiovascular: Positive for chest pain (Left-sided pleuritic) and leg swelling (Right ankle).  Genitourinary: Negative for difficulty urinating and dysuria.  Musculoskeletal: Positive for arthralgias, back pain, joint swelling and myalgias (Leg cramps).  Neurological: Negative for syncope and weakness.  Hematological: Bruises/bleeds easily.  All other systems reviewed and are negative.   BP (!) 150/72   Pulse 90   Temp 97.9 F (36.6 C) (Skin)   Resp 20   Ht 6' (1.829 m)   Wt 204 lb (92.5 kg)   SpO2 93% Comment: RA  BMI 27.67 kg/m  Physical Exam Vitals reviewed.  Constitutional:      General: He is not in acute distress.    Appearance: Normal appearance.  HENT:     Head: Normocephalic and atraumatic.  Eyes:     General: No scleral icterus.    Extraocular Movements: Extraocular movements intact.  Cardiovascular:     Rate and Rhythm: Normal rate and regular rhythm.     Heart sounds: Normal heart sounds. No murmur.  Pulmonary:     Effort: Pulmonary effort is normal.     Breath sounds: No wheezing.     Comments: Diminished breath sounds left base to mid lung Abdominal:     General: There is no distension.     Palpations: Abdomen is soft.  Musculoskeletal:        General: Swelling (Trace edema right ankle) present.     Cervical back: Neck supple.  Skin:    General: Skin is warm and dry.  Neurological:     General: No focal deficit present.     Mental Status: He is alert and oriented to person, place, and time.  Cranial Nerves: No cranial nerve deficit.     Motor: No weakness.    Diagnostic Tests: CT CHEST WITHOUT CONTRAST  TECHNIQUE: Multidetector CT imaging of the chest was performed following the standard protocol without IV contrast.  COMPARISON:  Radiograph 01/30/2020, additional prior radiographs reviewed. Chest CT  03/24/2019.  FINDINGS: Cardiovascular: Dense coronary artery calcifications of native coronary arteries post CABG. Atherosclerosis of the thoracic aorta with fusiform ascending aneurysmal dilatation, maximum dimension 4.3 cm. The previous broad-based saccular lesion in the distal aortic arch is grossly stable, no periaortic stranding. There is branch vessel atherosclerosis. Mild cardiomegaly without pericardial effusion.  Mediastinum/Nodes: Multiple prominent lymph nodes, all subcentimeter and majority are stable from prior exam. An 8 mm lower paratracheal node has slightly increased from prior. Limited assessment for hilar adenopathy in the absence of IV contrast. No visualized thyroid nodule. No esophageal wall thickening.  Lungs/Pleura: Moderate size left pleural effusion is loculated and tracks anteriorly. There is also pleural thickening dependently. Left lower lobe consolidation is likely in part compressive, with probable round atelectasis superiorly. Lobulated area of consolidation in the lingula adjacent to pleural effusion may also represent round atelectasis, but margins are slightly irregular and is nonspecific, series 3, image 83. Multiple small nodules are reticulonodular opacities at the left lung apex. There are innumerable tiny nodules throughout the right lung. These nodules are all in the 3-6 mm range trachea and mainstem bronchi are patent.  Upper Abdomen: Atherosclerosis of upper abdominal vasculature. Scarring of the included kidneys. No acute findings. No adrenal nodule.  Musculoskeletal: Median sternotomy. There are no acute or suspicious osseous abnormalities. No focal bone lesion. Subcortical cyst in the left proximal humerus also present on prior exam.  IMPRESSION: 1. Moderate loculated left pleural effusion with pleural thickening. Left lower lobe consolidation is likely in part compressive, with probable round atelectasis superiorly.  Additional area of subpleural opacity in the lingula may represent atelectasis, however has slightly spiculated margins. Pneumonia or malignancy is not excluded. Recommend correlation with recent thoracentesis results. 2. Innumerable tiny nodules throughout the right lung, all in the 3-6 mm range. This is likely infectious or inflammatory, however metastatic disease is not entirely excluded. 3. Unchanged saccular lesion in the distal aortic arch. No evidence of acute aortic inflammation on noncontrast exam. 4. Multiple prominent mediastinal lymph nodes, likely reactive but nonspecific. 5. Dense coronary artery calcifications post CABG. 6. Fusiform ascending aortic aneurysm. Recommend annual imaging followup by CTA or MRA. This recommendation follows 2010 ACCF/AHA/AATS/ACR/ASA/SCA/SCAI/SIR/STS/SVM Guidelines for the Diagnosis and Management of Patients with Thoracic Aortic Disease. Circulation. 2010; 121: E938-B017. Aortic aneurysm NOS (ICD10-I71.9)  Aortic Atherosclerosis (ICD10-I70.0).   Electronically Signed   By: Keith Rake M.D.   On: 02/02/2020 15:39 I personally reviewed the chest x-ray images and concur with the findings noted above  Impression: John [name] is a 69 year old gentleman a past medical history significant for coronary artery disease, non-ST elevation MI, coronary bypass grafting x4 in May 2020, stage V chronic kidney disease, hypertension, hyperlipidemia, type 2 diabetes with renal complications, and low back pain.  He now presents with a loculated left pleural effusion.  Although the effusion is moderate in size there is significant underlying consolidation/atelectasis in the left lung particularly in the lower lobe.  I think this is likely the source of his shortness of breath.  He had a lot of pain after his thoracentesis, which limited his relief from that procedure.  It is possible that there are other sources contributing to his dyspnea.  This  would be extremely unusual appearance for a post CABG pleural effusion.  He had almost no effusion 2 months postoperatively.  This is likely related to pneumonia. I recommended that we do bronchoscopy to rule out any underlying malignancy, although I think that is unlikely and most of the pulmonary changes are likely consolidation/atelectasis.  I recommended that we do a bronchoscopy and left video-assisted thoracoscopy for drainage of the pleural effusion and decortication of the lung.  I informed him of the general nature of the procedure including the need for general anesthesia, the incisions to be used, the use of drainage tubes postoperatively, the expected hospital stay, and the overall recovery.  I informed him of the indications, risk, benefits, and alternatives.  He understands the risks include, but not limited to death, MI, DVT, PE, bleeding, possible need for transfusion, infection, prolonged air leak, cardiac arrhythmias, as well as the possibility of other unforeseeable complications.  He wishes to proceed with surgery but says he needs to make some arrangements before scheduling.  He will call us to schedule in the next couple of weeks.  Plan: Bronchoscopy and left VATS to drain pleural effusion and decorticate the lung. Patient will call to schedule.  Melrose Nakayama, MD [facility] [phone]

## 2020-02-13 ENCOUNTER — Other Ambulatory Visit: Payer: Self-pay | Admitting: *Deleted

## 2020-02-13 DIAGNOSIS — J90 Pleural effusion, not elsewhere classified: Secondary | ICD-10-CM

## 2020-02-14 NOTE — Progress Notes (Signed)
[id number] - [place], [place] - [address] [place] [address] Phone: [phone] Fax: [phone]      Your procedure is scheduled on Monday, March 29th.  Report to [facility] Main Entrance "A" at 9:30 A.M., and check in at the Admitting office.  Call this number if you have problems the morning of surgery:  [phone]  Call [phone] if you have any questions prior to your surgery date Monday-Friday 8am-4pm    Remember:  Do not eat or drink after midnight the night before your surgery     Take these medicines the morning of surgery with A SIP OF WATER   Tylenol - if needed  Albuterol inhaler - if needed  Amlodipine (Norvasc)  Flonase nasal spray - if needed  Metoprolol  Hydralazine  Renvela  Follow your surgeon's instructions on when to stop Aspirin.  If no instructions were given by your surgeon then you will need to call the office to get those instructions.     As of today, stop taking all Aspirin (unless instructed by your doctor) and other Aspirin containing products, Vitamins, Fish Oils, and Herbal Medications. Also stop all NSAIDS i.e. Advil, Ibuprofen, Motrin, Aleve, Anaprox, Naproxen, BC, Goody Powders, and all Supplements.   WHAT DO I DO ABOUT MY DIABETES MEDICATION?   Marland Kitchen Do not take oral diabetes medicines (pills) the morning of surgery. - Glipizide, Tradjenta  . THE NIGHT BEFORE SURGERY, do NOT take evening dose of Glipizide     . THE MORNING OF SURGERY, take _____________ units of __________insulin.  . The day of surgery, do not take other diabetes injectables, including Byetta (exenatide), Bydureon (exenatide ER), Victoza (liraglutide), or Trulicity (dulaglutide).    HOW TO MANAGE YOUR DIABETES BEFORE AND AFTER SURGERY  Why is it important to control my blood sugar before and after surgery? . Improving blood sugar levels before and after surgery helps healing and can limit problems. . A way of improving blood  sugar control is eating a healthy diet by: o  Eating less sugar and carbohydrates o  Increasing activity/exercise o  Talking with your doctor about reaching your blood sugar goals . High blood sugars (greater than 180 mg/dL) can raise your risk of infections and slow your recovery, so you will need to focus on controlling your diabetes during the weeks before surgery. . Make sure that the doctor who takes care of your diabetes knows about your planned surgery including the date and location.  How do I manage my blood sugar before surgery? . Check your blood sugar at least 4 times a day, starting 2 days before surgery, to make sure that the level is not too high or low. . Check your blood sugar the morning of your surgery when you wake up and every 2 hours until you get to the Short Stay unit. o If your blood sugar is less than 70 mg/dL, you will need to treat for low blood sugar: - Do not take insulin. - Treat a low blood sugar (less than 70 mg/dL) with  cup of clear juice (cranberry or apple), 4 glucose tablets, OR glucose gel. - Recheck blood sugar in 15 minutes after treatment (to make sure it is greater than 70 mg/dL). If your blood sugar is not greater than 70 mg/dL on recheck, call [phone] for further instructions. . Report your blood sugar to the short stay nurse when you get to Short Stay.  . If you are admitted to the  hospital after surgery: o Your blood sugar will be checked by the staff and you will probably be given insulin after surgery (instead of oral diabetes medicines) to make sure you have good blood sugar levels. o The goal for blood sugar control after surgery is 80-180 mg/dL.    No Smoking of any kind, Tobacco, or Alcohol products 24 hours prior to your procedure. If you use a CPAP at night, you may bring all equipment for your overnight stay.                        Do not wear jewelry            Do not wear lotions, powders, colognes, or deodorant.             Men may shave face and neck.            Do not bring valuables to the hospital.            [facility] is not responsible for any belongings or valuables.   Contacts, glasses, dentures or bridgework may not be worn into surgery.      For patients admitted to the hospital, discharge time will be determined by your treatment team.   Patients discharged the day of surgery will not be allowed to drive home, and someone needs to stay with them for 24 hours.    Special instructions:   [facility]- Preparing For Surgery  Before surgery, you can play an important role. Because skin is not sterile, your skin needs to be as free of germs as possible. You can reduce the number of germs on your skin by washing with CHG (chlorahexidine gluconate) Soap before surgery.  CHG is an antiseptic cleaner which kills germs and bonds with the skin to continue killing germs even after washing.    Oral Hygiene is also important to reduce your risk of infection.  Remember - BRUSH YOUR TEETH THE MORNING OF SURGERY WITH YOUR REGULAR TOOTHPASTE  Please do not use if you have an allergy to CHG or antibacterial soaps. If your skin becomes reddened/irritated stop using the CHG.  Do not shave (including legs and underarms) for at least 48 hours prior to first CHG shower. It is OK to shave your face.  Please follow these instructions carefully.   1. Shower the NIGHT BEFORE SURGERY and the MORNING OF SURGERY with CHG Soap.   2. If you chose to wash your hair, wash your hair first as usual with your normal shampoo.  3. After you shampoo, rinse your hair and body thoroughly to remove the shampoo.  4. Use CHG as you would any other liquid soap. You can apply CHG directly to the skin and wash gently with a scrungie or a clean washcloth.   5. Apply the CHG Soap to your body ONLY FROM THE NECK DOWN.  Do not use on open wounds or open sores. Avoid contact with your eyes, ears, mouth and genitals (private parts). Wash Face and  genitals (private parts)  with your normal soap.   6. Wash thoroughly, paying special attention to the area where your surgery will be performed.  7. Thoroughly rinse your body with warm water from the neck down.  8. DO NOT shower/wash with your normal soap after using and rinsing off the CHG Soap.  9. Pat yourself dry with a CLEAN TOWEL.  10. Wear CLEAN PAJAMAS to bed the night before surgery, wear comfortable  clothes the morning of surgery  11. Place CLEAN SHEETS on your bed the night of your first shower and DO NOT SLEEP WITH PETS.   Day of Surgery:   Do not apply any deodorants/lotions.  Please wear clean clothes to the hospital/surgery center.   Remember to brush your teeth WITH YOUR REGULAR TOOTHPASTE.   Please read over the following fact sheets that you were given.

## 2020-02-15 ENCOUNTER — Other Ambulatory Visit: Payer: Self-pay

## 2020-02-15 ENCOUNTER — Encounter (HOSPITAL_COMMUNITY)
Admission: RE | Admit: 2020-02-15 | Discharge: 2020-02-15 | Disposition: A | Discharge status: Home / Self Care | Payer: Medicare HMO | Source: Ambulatory Visit | Attending: Thoracic Surgery (Cardiothoracic Vascular Surgery) | Admitting: Thoracic Surgery (Cardiothoracic Vascular Surgery)

## 2020-02-15 ENCOUNTER — Encounter (HOSPITAL_COMMUNITY): Payer: Self-pay

## 2020-02-15 ENCOUNTER — Other Ambulatory Visit (HOSPITAL_COMMUNITY)
Admission: RE | Admit: 2020-02-15 | Discharge: 2020-02-15 | Disposition: A | Discharge status: Home / Self Care | Payer: Medicare HMO | Source: Ambulatory Visit | Attending: Thoracic Surgery (Cardiothoracic Vascular Surgery) | Admitting: Thoracic Surgery (Cardiothoracic Vascular Surgery)

## 2020-02-15 DIAGNOSIS — Z8616 Personal history of COVID-19: Secondary | ICD-10-CM | POA: Diagnosis not present

## 2020-02-15 DIAGNOSIS — I120 Hypertensive chronic kidney disease with stage 5 chronic kidney disease or end stage renal disease: Secondary | ICD-10-CM | POA: Insufficient documentation

## 2020-02-15 DIAGNOSIS — Z87891 Personal history of nicotine dependence: Secondary | ICD-10-CM | POA: Diagnosis not present

## 2020-02-15 DIAGNOSIS — Z20822 Contact with and (suspected) exposure to covid-19: Secondary | ICD-10-CM | POA: Insufficient documentation

## 2020-02-15 DIAGNOSIS — J90 Pleural effusion, not elsewhere classified: Secondary | ICD-10-CM | POA: Diagnosis not present

## 2020-02-15 DIAGNOSIS — I252 Old myocardial infarction: Secondary | ICD-10-CM | POA: Diagnosis not present

## 2020-02-15 DIAGNOSIS — M1990 Unspecified osteoarthritis, unspecified site: Secondary | ICD-10-CM | POA: Insufficient documentation

## 2020-02-15 DIAGNOSIS — Z7982 Long term (current) use of aspirin: Secondary | ICD-10-CM | POA: Diagnosis not present

## 2020-02-15 DIAGNOSIS — E119 Type 2 diabetes mellitus without complications: Secondary | ICD-10-CM | POA: Diagnosis not present

## 2020-02-15 DIAGNOSIS — Z01812 Encounter for preprocedural laboratory examination: Secondary | ICD-10-CM | POA: Diagnosis not present

## 2020-02-15 DIAGNOSIS — Z951 Presence of aortocoronary bypass graft: Secondary | ICD-10-CM | POA: Insufficient documentation

## 2020-02-15 DIAGNOSIS — I2510 Atherosclerotic heart disease of native coronary artery without angina pectoris: Secondary | ICD-10-CM | POA: Insufficient documentation

## 2020-02-15 DIAGNOSIS — Z7901 Long term (current) use of anticoagulants: Secondary | ICD-10-CM | POA: Insufficient documentation

## 2020-02-15 DIAGNOSIS — N185 Chronic kidney disease, stage 5: Secondary | ICD-10-CM | POA: Diagnosis not present

## 2020-02-15 DIAGNOSIS — Z79899 Other long term (current) drug therapy: Secondary | ICD-10-CM | POA: Diagnosis not present

## 2020-02-15 DIAGNOSIS — Z794 Long term (current) use of insulin: Secondary | ICD-10-CM | POA: Insufficient documentation

## 2020-02-15 HISTORY — DX: Atherosclerotic heart disease of native coronary artery without angina pectoris: I25.10

## 2020-02-15 HISTORY — DX: Dyspnea, unspecified: R06.00

## 2020-02-15 HISTORY — DX: Unspecified osteoarthritis, unspecified site: M19.90

## 2020-02-15 HISTORY — DX: Thoracic aortic aneurysm, without rupture, unspecified: I71.20

## 2020-02-15 HISTORY — DX: Thoracic aortic aneurysm, without rupture: I71.2

## 2020-02-15 HISTORY — DX: Occlusion and stenosis of unspecified carotid artery: I65.29

## 2020-02-15 LAB — URINALYSIS, ROUTINE W REFLEX MICROSCOPIC
Bilirubin Urine: NEGATIVE
Glucose, UA: 50 mg/dL — AB
Hgb urine dipstick: NEGATIVE
Ketones, ur: NEGATIVE mg/dL
Leukocytes,Ua: NEGATIVE
Nitrite: NEGATIVE
Protein, ur: 100 mg/dL — AB
Specific Gravity, Urine: 1.01 (ref 1.005–1.030)
pH: 6 (ref 5.0–8.0)

## 2020-02-15 LAB — SURGICAL PCR SCREEN
MRSA, PCR: NEGATIVE
Staphylococcus aureus: NEGATIVE

## 2020-02-15 LAB — BLOOD GAS, ARTERIAL
Acid-base deficit: 6.4 mmol/L — ABNORMAL HIGH (ref 0.0–2.0)
Bicarbonate: 18.2 mmol/L — ABNORMAL LOW (ref 20.0–28.0)
Drawn by: 421801
FIO2: 21
O2 Saturation: 98.1 %
Patient temperature: 37
pCO2 arterial: 34.3 mmHg (ref 32.0–48.0)
pH, Arterial: 7.344 — ABNORMAL LOW (ref 7.350–7.450)
pO2, Arterial: 122 mmHg — ABNORMAL HIGH (ref 83.0–108.0)

## 2020-02-15 LAB — COMPREHENSIVE METABOLIC PANEL
ALT: 15 U/L (ref 0–44)
AST: 11 U/L — ABNORMAL LOW (ref 15–41)
Albumin: 3.1 g/dL — ABNORMAL LOW (ref 3.5–5.0)
Alkaline Phosphatase: 82 U/L (ref 38–126)
Anion gap: 12 (ref 5–15)
BUN: 61 mg/dL — ABNORMAL HIGH (ref 8–23)
CO2: 16 mmol/L — ABNORMAL LOW (ref 22–32)
Calcium: 9.2 mg/dL (ref 8.9–10.3)
Chloride: 110 mmol/L (ref 98–111)
Creatinine, Ser: 5.29 mg/dL — ABNORMAL HIGH (ref 0.61–1.24)
GFR calc Af Amer: 12 mL/min — ABNORMAL LOW (ref >60)
GFR calc non Af Amer: 10 mL/min — ABNORMAL LOW (ref >60)
Glucose, Bld: 100 mg/dL — ABNORMAL HIGH (ref 70–99)
Potassium: 4.7 mmol/L (ref 3.5–5.1)
Sodium: 138 mmol/L (ref 135–145)
Total Bilirubin: 0.6 mg/dL (ref 0.3–1.2)
Total Protein: 6.3 g/dL — ABNORMAL LOW (ref 6.5–8.1)

## 2020-02-15 LAB — CBC
HCT: 30.1 % — ABNORMAL LOW (ref 39.0–52.0)
Hemoglobin: 8.9 g/dL — ABNORMAL LOW (ref 13.0–17.0)
MCH: 26.1 pg (ref 26.0–34.0)
MCHC: 29.6 g/dL — ABNORMAL LOW (ref 30.0–36.0)
MCV: 88.3 fL (ref 80.0–100.0)
Platelets: 201 10*3/uL (ref 150–400)
RBC: 3.41 MIL/uL — ABNORMAL LOW (ref 4.22–5.81)
RDW: 15.4 % (ref 11.5–15.5)
WBC: 6.8 10*3/uL (ref 4.0–10.5)
nRBC: 0 % (ref 0.0–0.2)

## 2020-02-15 LAB — PROTIME-INR
INR: 1.1 (ref 0.8–1.2)
Prothrombin Time: 14.2 seconds (ref 11.4–15.2)

## 2020-02-15 LAB — SARS CORONAVIRUS 2 (TAT 6-24 HRS): SARS Coronavirus 2: NEGATIVE

## 2020-02-15 LAB — GLUCOSE, CAPILLARY: Glucose-Capillary: 108 mg/dL — ABNORMAL HIGH (ref 70–99)

## 2020-02-15 LAB — APTT: aPTT: 36 seconds (ref 24–36)

## 2020-02-15 LAB — HEMOGLOBIN A1C
Hgb A1c MFr Bld: 6.8 % — ABNORMAL HIGH (ref 4.8–5.6)
Mean Plasma Glucose: 148.46 mg/dL

## 2020-02-15 NOTE — Progress Notes (Signed)
PCP - [facility] in [place] - Dr Agustin Cree    Clearance  done  -   Chest x-ray - DOS EKG - 02/15/20 Stress Test - na ECHO - 2/21 Cardiac Cath - 5/20  Sleep Study - na CPAP - na  Fasting Blood Sugar - 108 Checks Blood Sugar _2____ times a day   Aspirin Instructions:stop 7 days     COVID TEST- for 25th  Heart hx Anesthesia review:   Patient denies shortness of breath, fever, cough and chest pain at PAT appointment   All instructions explained to the patient, with a verbal understanding of the material. Patient agrees to go over the instructions while at home for a better understanding. Patient also instructed to self quarantine after being tested for COVID-19. The opportunity to ask questions was provided.

## 2020-02-16 ENCOUNTER — Encounter (HOSPITAL_COMMUNITY): Payer: Self-pay

## 2020-02-16 NOTE — Progress Notes (Signed)
Anesthesia Chart Review:   Case: [id number] Date/Time: 02/19/20 1115   Procedures:      VIDEO BRONCHOSCOPY (N/A )     VIDEO ASSISTED THORACOSCOPY (Left Chest)     DRAINAGE OF PLEURAL EFFUSION (Left )     DECORTICATION (Left )   Anesthesia type: General   Pre-op diagnosis: LEFT PLEURAL EFFUSION   Location: [facility] OR ROOM 10 / [place] OR   Surgeons: Melrose Nakayama, MD      DISCUSSION: Patient is a 69 year old John [name] scheduled for the above procedure. He has CKD stage V and is s/p CABG 03/29/2019. He had a small left pleural effusion in post-operative follow-up that was treated with steroids with near resolution. He then was + COVID-19 06/14/19. In 12/2019 he developed worsening SOB and was found to have loculated left pleural effusion. Initially concerning for endocarditis, but no evidence of that on 01/12/20 TEE. He underwent left thoracentesis 01/09/20 by pulmonology (mixed inflammation, no malignant cells on pathology) and referred to CT surgery for surgical intervention. Pleural effusion felt likely related to pneumonia, but bronchoscopy to evaluate for any underlying malignancy.  History includes former smoker (quit 1998), HTN, DM2, CAD (MI 10/2003, s/p BMS RCA; NSTEMI, s/p CABG 03/29/19: LIMA-LAD, SVG-D1, SVG-OM1, SVG-PDA), infective endocarditis (of AV/MV secondary to MSSA 08/2017, s/p 6 weeks cefazolin), dyspnea, CKD (s/p right brachiocephalic AVF 82/42/35), AAA (s/p AFBG 2005), TAA (4.3 cm ascending TAA 02/02/20 CT), carotid artery stenosis (36-14% LICA, 4-31% RICA 03/27/99), spinal surgery (C6-7 ACDF 10/05/02; L3-4 laminectomy 06/20/09).   Preoperative labs showed BUN 61, Cr 5.29, eGFR 10. He has known CKD stage V and is followed by Dr. [name] at [facility]. Last visit 01/24/20. She notes that he was declined for [facility] transplant list due to aorto-iliac bypass. He was not having uremic symptoms at that time or volume overload. RUE AVF patent and ready for use if needed. Prior to CABG, baseline  Creatinine ~ 4.0-4.5. Post CABG his Creatinine ranged 5.17-5.John and eGFR 10-11 with most recent comparison result from 01/09/20 showed Cr 4.John with eGFR 13. His renal function should be followed post-operatively. Three month out-patient nephrology currently recommended.     Reportedly, holding ASA for 7 days for surgery. He is for CXR on the day of surgery. Given CKD stage V, although not on HD, will go ahead an order an ISTAT8 on the day of surgery--K 4.7 on 02/15/20.     VS: BP 134/65   Pulse 86   Temp 36.8 C (Oral)   Resp 18   Ht _0  (1.854 m)   Wt 95.4 kg   SpO2 97%   BMI 27.76 kg/m    PROVIDERS: Imagene Riches, NP is PCP  Jenne Campus, MD is cardiologist. Last evaluation 01/09/20. John Homes, MD is pulmonologist Jannifer Hick, MD is nephrologist   LABS: Preoperative labs noted.  (all labs ordered are listed, but only abnormal results are displayed)  Labs Reviewed  GLUCOSE, CAPILLARY - Abnormal; Notable for the following components:      Result Value   Glucose-Capillary 108 (*)    All other components within normal limits  BLOOD GAS, ARTERIAL - Abnormal; Notable for the following components:   pH, Arterial 7.344 (*)    pO2, Arterial 122 (*)    Bicarbonate 18.2 (*)    Acid-base deficit 6.4 (*)    All other components within normal limits  CBC - Abnormal; Notable for the following components:   RBC 3.41 (*)    Hemoglobin 8.9 (*)  HCT 30.1 (*)    MCHC 29.6 (*)    All other components within normal limits  COMPREHENSIVE METABOLIC PANEL - Abnormal; Notable for the following components:   CO2 16 (*)    Glucose, Bld 100 (*)    BUN 61 (*)    Creatinine, Ser 5.29 (*)    Total Protein 6.3 (*)    Albumin 3.1 (*)    AST 11 (*)    GFR calc non Af Amer 10 (*)    GFR calc Af Amer 12 (*)    All other components within normal limits  URINALYSIS, ROUTINE W REFLEX MICROSCOPIC - Abnormal; Notable for the following components:   Color, Urine STRAW (*)    Glucose, UA  50 (*)    Protein, ur 100 (*)    Bacteria, UA FEW (*)    All other components within normal limits  HEMOGLOBIN A1C - Abnormal; Notable for the following components:   Hgb A1c MFr Bld 6.8 (*)    All other components within normal limits  SURGICAL PCR SCREEN  APTT  PROTIME-INR  TYPE AND SCREEN     IMAGES: CT Chest 02/02/20: IMPRESSION: 1. Moderate loculated left pleural effusion with pleural thickening. Left lower lobe consolidation is likely in part compressive, with probable round atelectasis superiorly. Additional area of subpleural opacity in the lingula may represent atelectasis, however has slightly spiculated margins. Pneumonia or malignancy is not excluded. Recommend correlation with recent thoracentesis results. 2. Innumerable tiny nodules throughout the right lung, all in the 3-6 mm range. This is likely infectious or inflammatory, however metastatic disease is not entirely excluded. 3. Unchanged saccular lesion in the distal aortic arch. No evidence of acute aortic inflammation on noncontrast exam. 4. Multiple prominent mediastinal lymph nodes, likely reactive but nonspecific. 5. Dense coronary artery calcifications post CABG. 6. Fusiform ascending aortic aneurysm, maximum dimension 4.3 cm.  Recommend annual imaging followup by CTA or MRA. This recommendation follows 2010 ACCF/AHA/AATS/ACR/ASA/SCA/SCAI/SIR/STS/SVM Guidelines for the Diagnosis and Management of Patients with Thoracic Aortic Disease. Circulation. 2010; 121: F354-T625. Aortic aneurysm NOS (ICD10-I71.9) - Aortic Atherosclerosis (ICD10-I70.0).   EKG: 02/15/20: Normal sinus rhythm Nonspecific T wave abnormality Abnormal ECG No significant change since last tracing Confirmed by Ena Dawley [phone]) on 02/15/2020 6:52:42 PM   CV: TEE 01/12/20: IMPRESSIONS  1. Left ventricular ejection fraction, by estimation, is 40 to 45%. The  left ventricle has mildly decreased function. The left ventricle   demonstrates regional wall motion abnormalities (see scoring  diagram/findings for description). There is mild  concentric left ventricular hypertrophy. Left ventricular diastolic  function could not be evaluated.  2. Right ventricular systolic function is normal. The right ventricular  size is normal.  3. No left atrial/left atrial appendage thrombus was detected.  4. The mitral valve is normal in structure and function. Mild mitral  valve regurgitation. No evidence of mitral stenosis.  5. The aortic valve is normal in structure and function. Aortic valve  regurgitation is not visualized. Mild aortic valve sclerosis is present,  with no evidence of aortic valve stenosis.  6. The inferior vena cava is normal in size with greater than 50%  respiratory variability, suggesting right atrial pressure of 3 mmHg.  Conclusion(s)/Recomendation(s):  No evidence of vegetation/infective  endocarditis on this transesophageal  echocardiogram.  (Comparison LVEF: 40-45% 01/02/20; 50-55% 03/25/19)   Last LHC 03/27/19 prior to CABG and showed severe multivessel CAD.   Carotid US 03/27/19: Summary:  - Right Carotid: Velocities in the right ICA are consistent with  a 1-39%  stenosis.  - Left Carotid: Velocities in the left ICA are consistent with a 40-59%  stenosis.  - Vertebrals: Bilateral vertebral arteries demonstrate antegrade flow.  - Subclavians: Right subclavian artery was stenotic. Normal flow  hemodynamics were        seen in the left subclavian artery.    Past Medical History:  Diagnosis Date  . Arthritis   . Carotid artery stenosis    56-31% LICA, 4-97% RICA 0/2/63 [place]  . Coronary artery disease   . Diabetes mellitus without complication ([place])   . Dyspnea   . Hypertension   . MI (myocardial infarction) ([place])   . Renal disorder   . Thoracic aortic aneurysm (TAA) ([facility])    4.3 cm ascending TAA 02/02/20 CT    Past Surgical History:  Procedure Laterality Date  . ABDOMINAL  AORTIC ANEURYSM REPAIR    . APPENDECTOMY    . AV FISTULA PLACEMENT Right 10/18/2018   Procedure: BRACHIO-CEPHALIC ARTERIOVENOUS (AV) FISTULA CREATION;  Surgeon: Angelia Mould, MD;  Location: [place];  Service: Vascular;  Laterality: Right;  . CARDIAC CATHETERIZATION    . CORONARY ANGIOPLASTY    . CORONARY ARTERY BYPASS GRAFT N/A 03/29/2019   Procedure: CORONARY ARTERY BYPASS GRAFTING (CABG) x 4, using left internal mammary artery and right leg greater saphenous vein harvested endoscopically;  Surgeon: Melrose Nakayama, MD;  Location: [place];  Service: Open Heart Surgery;  Laterality: N/A;  . CORONARY PRESSURE WIRE/FFR WITH 3D MAPPING N/A 03/27/2019   Procedure: Coronary Pressure Wire/FFR w/3D Mapping;  Surgeon: Jettie Booze, MD;  Location: [place] CV LAB;  Service: Cardiovascular;  Laterality: N/A;  . IR FLUORO GUIDE CV LINE RIGHT  09/13/2017  . IR REMOVAL TUN CV CATH W/O FL  10/22/2017  . IR US GUIDE VASC ACCESS RIGHT  09/13/2017  . LEFT HEART CATH AND CORONARY ANGIOGRAPHY N/A 03/27/2019   Procedure: LEFT HEART CATH AND CORONARY ANGIOGRAPHY;  Surgeon: Jettie Booze, MD;  Location: [place] CV LAB;  Service: Cardiovascular;  Laterality: N/A;  . NECK SURGERY    . SMALL INTESTINE SURGERY    . TEE WITHOUT CARDIOVERSION N/A 09/10/2017   Procedure: TRANSESOPHAGEAL ECHOCARDIOGRAM (TEE);  Surgeon: Skeet Latch, MD;  Location: [place];  Service: Cardiovascular;  Laterality: N/A;  . TEE WITHOUT CARDIOVERSION N/A 03/29/2019   Procedure: TRANSESOPHAGEAL ECHOCARDIOGRAM (TEE);  Surgeon: Melrose Nakayama, MD;  Location: [place];  Service: Open Heart Surgery;  Laterality: N/A;  . TEE WITHOUT CARDIOVERSION N/A 01/12/2020   Procedure: TRANSESOPHAGEAL ECHOCARDIOGRAM (TEE);  Surgeon: Sanda Klein, MD;  Location: [facility] ENDOSCOPY;  Service: Cardiovascular;  Laterality: N/A;    MEDICATIONS: . acetaminophen (TYLENOL) 500 MG tablet  . albuterol (VENTOLIN HFA) 108 (90 Base)  MCG/ACT inhaler  . amLODipine (NORVASC) 5 MG tablet  . ascorbic acid (VITAMIN C) 500 MG tablet  . aspirin EC 81 MG tablet  . atorvastatin (LIPITOR) 40 MG tablet  . calcitRIOL (ROCALTROL) 0.5 MCG capsule  . Dulaglutide (TRULICITY) 1.5 ZC/5.8IF SOPN  . ferrous sulfate 325 (65 FE) MG tablet  . fluticasone (FLONASE) 50 MCG/ACT nasal spray  . gabapentin (NEURONTIN) 100 MG capsule  . glipiZIDE (GLUCOTROL XL) 2.5 MG 24 hr tablet  . hydrALAZINE (APRESOLINE) 10 MG tablet  . linagliptin (TRADJENTA) 5 MG TABS tablet  . metoprolol succinate (TOPROL-XL) 50 MG 24 hr tablet  . OVER THE COUNTER MEDICATION  . rOPINIRole (REQUIP) 0.25 MG tablet  . sevelamer carbonate (RENVELA) 800 MG tablet  . tamsulosin (  FLOMAX) 0.4 MG CAPS capsule  . zolpidem (AMBIEN) 10 MG tablet   No current facility-administered medications for this encounter.     Myra Gianotti, PA-C Surgical Short Stay/Anesthesiology [facility] Phone [phone] [facility] Phone [phone] 02/16/2020 6:07 PM

## 2020-02-19 ENCOUNTER — Other Ambulatory Visit: Payer: Self-pay | Admitting: Thoracic Surgery (Cardiothoracic Vascular Surgery)

## 2020-02-19 ENCOUNTER — Inpatient Hospital Stay (HOSPITAL_COMMUNITY): Payer: Medicare HMO

## 2020-02-19 ENCOUNTER — Inpatient Hospital Stay (HOSPITAL_COMMUNITY): Payer: Medicare HMO | Admitting: Vascular Surgery

## 2020-02-19 ENCOUNTER — Encounter (HOSPITAL_COMMUNITY): Payer: Self-pay | Admitting: Thoracic Surgery (Cardiothoracic Vascular Surgery)

## 2020-02-19 ENCOUNTER — Other Ambulatory Visit: Payer: Self-pay

## 2020-02-19 ENCOUNTER — Inpatient Hospital Stay (HOSPITAL_COMMUNITY)
Admission: RE | Admit: 2020-02-19 | Discharge: 2020-02-24 | DRG: 164 | Disposition: A | Discharge status: Home / Self Care | Payer: Medicare HMO | Attending: Thoracic Surgery (Cardiothoracic Vascular Surgery) | Admitting: Thoracic Surgery (Cardiothoracic Vascular Surgery)

## 2020-02-19 ENCOUNTER — Encounter (HOSPITAL_COMMUNITY)
Admission: RE | Disposition: A | Discharge status: Home / Self Care | Payer: Self-pay | Source: Home / Self Care | Attending: Thoracic Surgery (Cardiothoracic Vascular Surgery)

## 2020-02-19 DIAGNOSIS — I252 Old myocardial infarction: Secondary | ICD-10-CM | POA: Diagnosis not present

## 2020-02-19 DIAGNOSIS — E785 Hyperlipidemia, unspecified: Secondary | ICD-10-CM | POA: Diagnosis present

## 2020-02-19 DIAGNOSIS — M545 Low back pain: Secondary | ICD-10-CM | POA: Diagnosis present

## 2020-02-19 DIAGNOSIS — Z951 Presence of aortocoronary bypass graft: Secondary | ICD-10-CM

## 2020-02-19 DIAGNOSIS — I2510 Atherosclerotic heart disease of native coronary artery without angina pectoris: Secondary | ICD-10-CM | POA: Diagnosis present

## 2020-02-19 DIAGNOSIS — J948 Other specified pleural conditions: Secondary | ICD-10-CM | POA: Diagnosis not present

## 2020-02-19 DIAGNOSIS — Z79899 Other long term (current) drug therapy: Secondary | ICD-10-CM | POA: Diagnosis not present

## 2020-02-19 DIAGNOSIS — I712 Thoracic aortic aneurysm, without rupture: Secondary | ICD-10-CM | POA: Diagnosis present

## 2020-02-19 DIAGNOSIS — E1122 Type 2 diabetes mellitus with diabetic chronic kidney disease: Secondary | ICD-10-CM | POA: Diagnosis present

## 2020-02-19 DIAGNOSIS — Z880 Allergy status to penicillin: Secondary | ICD-10-CM

## 2020-02-19 DIAGNOSIS — J939 Pneumothorax, unspecified: Secondary | ICD-10-CM

## 2020-02-19 DIAGNOSIS — L259 Unspecified contact dermatitis, unspecified cause: Secondary | ICD-10-CM | POA: Diagnosis not present

## 2020-02-19 DIAGNOSIS — J90 Pleural effusion, not elsewhere classified: Secondary | ICD-10-CM | POA: Diagnosis present

## 2020-02-19 DIAGNOSIS — I120 Hypertensive chronic kidney disease with stage 5 chronic kidney disease or end stage renal disease: Secondary | ICD-10-CM | POA: Diagnosis present

## 2020-02-19 DIAGNOSIS — I700 Atherosclerosis of aorta: Secondary | ICD-10-CM | POA: Diagnosis present

## 2020-02-19 DIAGNOSIS — J918 Pleural effusion in other conditions classified elsewhere: Secondary | ICD-10-CM | POA: Diagnosis not present

## 2020-02-19 DIAGNOSIS — Z09 Encounter for follow-up examination after completed treatment for conditions other than malignant neoplasm: Secondary | ICD-10-CM

## 2020-02-19 DIAGNOSIS — N185 Chronic kidney disease, stage 5: Secondary | ICD-10-CM | POA: Diagnosis present

## 2020-02-19 DIAGNOSIS — Z7982 Long term (current) use of aspirin: Secondary | ICD-10-CM

## 2020-02-19 HISTORY — PX: VIDEO ASSISTED THORACOSCOPY: SHX5073

## 2020-02-19 HISTORY — PX: VIDEO BRONCHOSCOPY: SHX5072

## 2020-02-19 HISTORY — PX: DECORTICATION: SHX5101

## 2020-02-19 HISTORY — PX: INTERCOSTAL NERVE BLOCK: SHX5021

## 2020-02-19 HISTORY — PX: PLEURAL EFFUSION DRAINAGE: SHX5099

## 2020-02-19 LAB — POCT I-STAT, CHEM 8
BUN: 51 mg/dL — ABNORMAL HIGH (ref 8–23)
BUN: 51 mg/dL — ABNORMAL HIGH (ref 8–23)
Calcium, Ion: 1.41 mmol/L — ABNORMAL HIGH (ref 1.15–1.40)
Calcium, Ion: 1.34 mmol/L (ref 1.15–1.40)
Chloride: 113 mmol/L — ABNORMAL HIGH (ref 98–111)
Chloride: 114 mmol/L — ABNORMAL HIGH (ref 98–111)
Creatinine, Ser: 5.9 mg/dL — ABNORMAL HIGH (ref 0.61–1.24)
Creatinine, Ser: 5.6 mg/dL — ABNORMAL HIGH (ref 0.61–1.24)
Glucose, Bld: 100 mg/dL — ABNORMAL HIGH (ref 70–99)
Glucose, Bld: 119 mg/dL — ABNORMAL HIGH (ref 70–99)
HCT: 29 % — ABNORMAL LOW (ref 39.0–52.0)
HCT: 27 % — ABNORMAL LOW (ref 39.0–52.0)
Hemoglobin: 9.9 g/dL — ABNORMAL LOW (ref 13.0–17.0)
Hemoglobin: 9.2 g/dL — ABNORMAL LOW (ref 13.0–17.0)
Potassium: 4.9 mmol/L (ref 3.5–5.1)
Potassium: 4.9 mmol/L (ref 3.5–5.1)
Sodium: 144 mmol/L (ref 135–145)
Sodium: 142 mmol/L (ref 135–145)
TCO2: 21 mmol/L — ABNORMAL LOW (ref 22–32)
TCO2: 22 mmol/L (ref 22–32)

## 2020-02-19 LAB — POCT I-STAT 7, (LYTES, BLD GAS, ICA,H+H)
Acid-base deficit: 8 mmol/L — ABNORMAL HIGH (ref 0.0–2.0)
Acid-base deficit: 8 mmol/L — ABNORMAL HIGH (ref 0.0–2.0)
Acid-base deficit: 9 mmol/L — ABNORMAL HIGH (ref 0.0–2.0)
Bicarbonate: 20 mmol/L (ref 20.0–28.0)
Bicarbonate: 19.8 mmol/L — ABNORMAL LOW (ref 20.0–28.0)
Bicarbonate: 20.4 mmol/L (ref 20.0–28.0)
Calcium, Ion: 1.37 mmol/L (ref 1.15–1.40)
Calcium, Ion: 1.33 mmol/L (ref 1.15–1.40)
Calcium, Ion: 1.37 mmol/L (ref 1.15–1.40)
HCT: 26 % — ABNORMAL LOW (ref 39.0–52.0)
HCT: 26 % — ABNORMAL LOW (ref 39.0–52.0)
HCT: 29 % — ABNORMAL LOW (ref 39.0–52.0)
Hemoglobin: 8.8 g/dL — ABNORMAL LOW (ref 13.0–17.0)
Hemoglobin: 8.8 g/dL — ABNORMAL LOW (ref 13.0–17.0)
Hemoglobin: 9.9 g/dL — ABNORMAL LOW (ref 13.0–17.0)
O2 Saturation: 99 %
O2 Saturation: 100 %
O2 Saturation: 100 %
Patient temperature: 35
Patient temperature: 35
Potassium: 5.2 mmol/L — ABNORMAL HIGH (ref 3.5–5.1)
Potassium: 5.6 mmol/L — ABNORMAL HIGH (ref 3.5–5.1)
Potassium: 5.7 mmol/L — ABNORMAL HIGH (ref 3.5–5.1)
Sodium: 143 mmol/L (ref 135–145)
Sodium: 142 mmol/L (ref 135–145)
Sodium: 142 mmol/L (ref 135–145)
TCO2: 22 mmol/L (ref 22–32)
TCO2: 22 mmol/L (ref 22–32)
TCO2: 22 mmol/L (ref 22–32)
pCO2 arterial: 53.3 mmHg — ABNORMAL HIGH (ref 32.0–48.0)
pCO2 arterial: 51 mmHg — ABNORMAL HIGH (ref 32.0–48.0)
pCO2 arterial: 53 mmHg — ABNORMAL HIGH (ref 32.0–48.0)
pH, Arterial: 7.182 — CL (ref 7.350–7.450)
pH, Arterial: 7.17 — CL (ref 7.350–7.450)
pH, Arterial: 7.198 — CL (ref 7.350–7.450)
pO2, Arterial: 185 mmHg — ABNORMAL HIGH (ref 83.0–108.0)
pO2, Arterial: 232 mmHg — ABNORMAL HIGH (ref 83.0–108.0)
pO2, Arterial: 283 mmHg — ABNORMAL HIGH (ref 83.0–108.0)

## 2020-02-19 LAB — BLOOD GAS, ARTERIAL
Acid-base deficit: 7.2 mmol/L — ABNORMAL HIGH (ref 0.0–2.0)
Bicarbonate: 17.7 mmol/L — ABNORMAL LOW (ref 20.0–28.0)
FIO2: 21
O2 Saturation: 93.4 %
Patient temperature: 36.5
pCO2 arterial: 34.5 mmHg (ref 32.0–48.0)
pH, Arterial: 7.327 — ABNORMAL LOW (ref 7.350–7.450)
pO2, Arterial: 67.8 mmHg — ABNORMAL LOW (ref 83.0–108.0)

## 2020-02-19 LAB — GLUCOSE, CAPILLARY
Glucose-Capillary: 97 mg/dL (ref 70–99)
Glucose-Capillary: 161 mg/dL — ABNORMAL HIGH (ref 70–99)
Glucose-Capillary: 147 mg/dL — ABNORMAL HIGH (ref 70–99)

## 2020-02-19 LAB — PREPARE RBC (CROSSMATCH)

## 2020-02-19 SURGERY — BRONCHOSCOPY, VIDEO-ASSISTED
Anesthesia: General | Site: Chest

## 2020-02-19 MED ORDER — SODIUM CHLORIDE 0.9 % IV SOLN: INTRAVENOUS | Status: DC | PRN

## 2020-02-19 MED ORDER — HYDROMORPHONE HCL 1 MG/ML IJ SOLN: 0.2500 mg | INTRAMUSCULAR | Status: DC | PRN

## 2020-02-19 MED ORDER — VASOPRESSIN 20 UNIT/ML IV SOLN
INTRAVENOUS | Status: DC | PRN
  Administered 2020-02-19: 1 [IU] via INTRAVENOUS

## 2020-02-19 MED ORDER — ALBUTEROL SULFATE (2.5 MG/3ML) 0.083% IN NEBU: 2.5000 mg | INHALATION_SOLUTION | Freq: Four times a day (QID) | RESPIRATORY_TRACT | Status: DC | PRN

## 2020-02-19 MED ORDER — MIDAZOLAM HCL 2 MG/2ML IJ SOLN
INTRAMUSCULAR | Status: AC
  Filled 2020-02-19: qty 2

## 2020-02-19 MED ORDER — SODIUM CHLORIDE 0.9% FLUSH: 9.0000 mL | INTRAVENOUS | Status: DC | PRN

## 2020-02-19 MED ORDER — KETOROLAC TROMETHAMINE 0.5 % OP SOLN
1.0000 [drp] | Freq: Four times a day (QID) | OPHTHALMIC | Status: DC
  Administered 2020-02-19 – 2020-02-24 (×15): 1 [drp] via OPHTHALMIC
  Filled 2020-02-19: qty 5

## 2020-02-19 MED ORDER — VASOPRESSIN 20 UNIT/ML IV SOLN
INTRAVENOUS | Status: AC
  Filled 2020-02-19: qty 1

## 2020-02-19 MED ORDER — FENTANYL CITRATE (PF) 250 MCG/5ML IJ SOLN
INTRAMUSCULAR | Status: AC
  Filled 2020-02-19: qty 5

## 2020-02-19 MED ORDER — SODIUM CHLORIDE 0.9% IV SOLUTION: Freq: Once | INTRAVENOUS | Status: DC

## 2020-02-19 MED ORDER — IPRATROPIUM-ALBUTEROL 0.5-2.5 (3) MG/3ML IN SOLN
RESPIRATORY_TRACT | Status: AC
  Administered 2020-02-19: 21:00:00 3 mL via RESPIRATORY_TRACT
  Filled 2020-02-19: qty 3

## 2020-02-19 MED ORDER — PHENYLEPHRINE 40 MCG/ML (10ML) SYRINGE FOR IV PUSH (FOR BLOOD PRESSURE SUPPORT)
PREFILLED_SYRINGE | INTRAVENOUS | Status: AC
  Filled 2020-02-19: qty 10

## 2020-02-19 MED ORDER — ENOXAPARIN SODIUM 40 MG/0.4ML ~~LOC~~ SOLN: 40.0000 mg | Freq: Every day | SUBCUTANEOUS | Status: DC

## 2020-02-19 MED ORDER — MIDAZOLAM HCL 5 MG/5ML IJ SOLN
INTRAMUSCULAR | Status: DC | PRN
  Administered 2020-02-19 (×2): 1 mg via INTRAVENOUS

## 2020-02-19 MED ORDER — TAMSULOSIN HCL 0.4 MG PO CAPS
0.4000 mg | ORAL_CAPSULE | Freq: Every day | ORAL | Status: DC
  Administered 2020-02-19 – 2020-02-23 (×5): 0.4 mg via ORAL
  Filled 2020-02-19 (×5): qty 1

## 2020-02-19 MED ORDER — METOCLOPRAMIDE HCL 5 MG/ML IJ SOLN
10.0000 mg | Freq: Four times a day (QID) | INTRAMUSCULAR | Status: AC
  Administered 2020-02-20 (×2): 10 mg via INTRAVENOUS
  Filled 2020-02-19 (×4): qty 2

## 2020-02-19 MED ORDER — AMLODIPINE BESYLATE 5 MG PO TABS
5.0000 mg | ORAL_TABLET | Freq: Every day | ORAL | Status: DC
  Administered 2020-02-20 – 2020-02-24 (×5): 5 mg via ORAL
  Filled 2020-02-19 (×5): qty 1

## 2020-02-19 MED ORDER — MEPERIDINE HCL 25 MG/ML IJ SOLN: 6.2500 mg | INTRAMUSCULAR | Status: DC | PRN

## 2020-02-19 MED ORDER — KETOROLAC TROMETHAMINE 30 MG/ML IJ SOLN
INTRAMUSCULAR | Status: AC
  Filled 2020-02-19: qty 1

## 2020-02-19 MED ORDER — ROCURONIUM BROMIDE 100 MG/10ML IV SOLN
INTRAVENOUS | Status: DC | PRN
  Administered 2020-02-19 (×2): 20 mg via INTRAVENOUS
  Administered 2020-02-19: 60 mg via INTRAVENOUS

## 2020-02-19 MED ORDER — PROPOFOL 10 MG/ML IV BOLUS
INTRAVENOUS | Status: DC | PRN
  Administered 2020-02-19: 100 mg via INTRAVENOUS

## 2020-02-19 MED ORDER — IPRATROPIUM-ALBUTEROL 0.5-2.5 (3) MG/3ML IN SOLN: 3.0000 mL | Freq: Four times a day (QID) | RESPIRATORY_TRACT | Status: DC

## 2020-02-19 MED ORDER — SODIUM CHLORIDE 0.9 % IV SOLN: INTRAVENOUS | Status: DC

## 2020-02-19 MED ORDER — CHLORHEXIDINE GLUCONATE CLOTH 2 % EX PADS
6.0000 | MEDICATED_PAD | Freq: Every day | CUTANEOUS | Status: DC
  Administered 2020-02-22 – 2020-02-23 (×2): 6 via TOPICAL

## 2020-02-19 MED ORDER — SUCCINYLCHOLINE CHLORIDE 200 MG/10ML IV SOSY
PREFILLED_SYRINGE | INTRAVENOUS | Status: AC
  Filled 2020-02-19: qty 10

## 2020-02-19 MED ORDER — FENTANYL CITRATE (PF) 100 MCG/2ML IJ SOLN
INTRAMUSCULAR | Status: AC
  Filled 2020-02-19: qty 2

## 2020-02-19 MED ORDER — DEXAMETHASONE SODIUM PHOSPHATE 10 MG/ML IJ SOLN
INTRAMUSCULAR | Status: DC | PRN
  Administered 2020-02-19: 5 mg via INTRAVENOUS

## 2020-02-19 MED ORDER — ACETAMINOPHEN 500 MG PO TABS
1000.0000 mg | ORAL_TABLET | Freq: Four times a day (QID) | ORAL | Status: DC
  Administered 2020-02-20 – 2020-02-24 (×16): 1000 mg via ORAL
  Filled 2020-02-19 (×17): qty 2

## 2020-02-19 MED ORDER — HYDRALAZINE HCL 10 MG PO TABS
10.0000 mg | ORAL_TABLET | Freq: Three times a day (TID) | ORAL | Status: DC
  Administered 2020-02-19 – 2020-02-24 (×13): 10 mg via ORAL
  Filled 2020-02-19 (×14): qty 1

## 2020-02-19 MED ORDER — PHENYLEPHRINE 40 MCG/ML (10ML) SYRINGE FOR IV PUSH (FOR BLOOD PRESSURE SUPPORT)
PREFILLED_SYRINGE | INTRAVENOUS | Status: DC | PRN
  Administered 2020-02-19: 80 ug via INTRAVENOUS
  Administered 2020-02-19: 40 ug via INTRAVENOUS
  Administered 2020-02-19: 80 ug via INTRAVENOUS
  Administered 2020-02-19: 40 ug via INTRAVENOUS
  Administered 2020-02-19: 80 ug via INTRAVENOUS
  Administered 2020-02-19: 40 ug via INTRAVENOUS
  Administered 2020-02-19 (×2): 80 ug via INTRAVENOUS

## 2020-02-19 MED ORDER — SUGAMMADEX SODIUM 500 MG/5ML IV SOLN
INTRAVENOUS | Status: AC
  Filled 2020-02-19: qty 5

## 2020-02-19 MED ORDER — OXYCODONE HCL 5 MG PO TABS
5.0000 mg | ORAL_TABLET | ORAL | Status: DC | PRN
  Administered 2020-02-22 – 2020-02-24 (×4): 10 mg via ORAL
  Filled 2020-02-19 (×4): qty 2

## 2020-02-19 MED ORDER — ONDANSETRON HCL 4 MG/2ML IJ SOLN
INTRAMUSCULAR | Status: DC | PRN
  Administered 2020-02-19: 4 mg via INTRAVENOUS

## 2020-02-19 MED ORDER — VANCOMYCIN HCL IN DEXTROSE 1-5 GM/200ML-% IV SOLN
1000.0000 mg | Freq: Two times a day (BID) | INTRAVENOUS | Status: AC
  Administered 2020-02-19: 1000 mg via INTRAVENOUS
  Filled 2020-02-19: qty 200

## 2020-02-19 MED ORDER — SODIUM CHLORIDE (PF) 0.9 % IJ SOLN
INTRAMUSCULAR | Status: DC | PRN
  Administered 2020-02-19 (×5): 10 mL via INTRAVENOUS

## 2020-02-19 MED ORDER — FERROUS SULFATE 325 (65 FE) MG PO TABS
325.0000 mg | ORAL_TABLET | Freq: Two times a day (BID) | ORAL | Status: DC
  Administered 2020-02-20 – 2020-02-24 (×9): 325 mg via ORAL
  Filled 2020-02-19 (×9): qty 1

## 2020-02-19 MED ORDER — FLUTICASONE PROPIONATE 50 MCG/ACT NA SUSP
1.0000 | Freq: Every day | NASAL | Status: DC | PRN
  Filled 2020-02-19: qty 16

## 2020-02-19 MED ORDER — INSULIN ASPART 100 UNIT/ML ~~LOC~~ SOLN
0.0000 [IU] | SUBCUTANEOUS | Status: DC
  Administered 2020-02-19 – 2020-02-20 (×2): 2 [IU] via SUBCUTANEOUS

## 2020-02-19 MED ORDER — ACETAMINOPHEN 160 MG/5ML PO SOLN: 1000.0000 mg | Freq: Four times a day (QID) | ORAL | Status: DC

## 2020-02-19 MED ORDER — DIPHENHYDRAMINE HCL 50 MG/ML IJ SOLN: 12.5000 mg | Freq: Four times a day (QID) | INTRAMUSCULAR | Status: DC | PRN

## 2020-02-19 MED ORDER — DIPHENHYDRAMINE HCL 12.5 MG/5ML PO ELIX: 12.5000 mg | ORAL_SOLUTION | Freq: Four times a day (QID) | ORAL | Status: DC | PRN

## 2020-02-19 MED ORDER — ASPIRIN EC 81 MG PO TBEC
81.0000 mg | DELAYED_RELEASE_TABLET | Freq: Every day | ORAL | Status: DC
  Administered 2020-02-20 – 2020-02-24 (×5): 81 mg via ORAL
  Filled 2020-02-19 (×5): qty 1

## 2020-02-19 MED ORDER — LIDOCAINE 2% (20 MG/ML) 5 ML SYRINGE
INTRAMUSCULAR | Status: AC
  Filled 2020-02-19: qty 5

## 2020-02-19 MED ORDER — SEVELAMER CARBONATE 800 MG PO TABS
800.0000 mg | ORAL_TABLET | Freq: Three times a day (TID) | ORAL | Status: DC
  Administered 2020-02-20 – 2020-02-24 (×13): 800 mg via ORAL
  Filled 2020-02-19 (×13): qty 1

## 2020-02-19 MED ORDER — FENTANYL 50 MCG/ML IV PCA SOLN
INTRAVENOUS | Status: DC
  Administered 2020-02-20 (×2): 15 ug via INTRAVENOUS
  Administered 2020-02-20: 90 ug via INTRAVENOUS
  Administered 2020-02-20: 15 ug via INTRAVENOUS
  Administered 2020-02-20: 75 ug via INTRAVENOUS
  Administered 2020-02-21: 0 ug via INTRAVENOUS
  Administered 2020-02-21: 30 ug via INTRAVENOUS
  Administered 2020-02-21: 15 ug via INTRAVENOUS
  Administered 2020-02-22: 0 ug via INTRAVENOUS
  Filled 2020-02-19: qty 20

## 2020-02-19 MED ORDER — PROPOFOL 10 MG/ML IV BOLUS
INTRAVENOUS | Status: AC
  Filled 2020-02-19: qty 20

## 2020-02-19 MED ORDER — NALOXONE HCL 0.4 MG/ML IJ SOLN: 0.4000 mg | INTRAMUSCULAR | Status: DC | PRN

## 2020-02-19 MED ORDER — ROPINIROLE HCL 0.25 MG PO TABS
0.2500 mg | ORAL_TABLET | Freq: Every day | ORAL | Status: DC
  Administered 2020-02-19 – 2020-02-23 (×5): 0.25 mg via ORAL
  Filled 2020-02-19 (×6): qty 1

## 2020-02-19 MED ORDER — ONDANSETRON HCL 4 MG/2ML IJ SOLN: 4.0000 mg | Freq: Four times a day (QID) | INTRAMUSCULAR | Status: DC | PRN

## 2020-02-19 MED ORDER — SENNOSIDES-DOCUSATE SODIUM 8.6-50 MG PO TABS
1.0000 | ORAL_TABLET | Freq: Every day | ORAL | Status: DC
  Administered 2020-02-21 – 2020-02-23 (×3): 1 via ORAL
  Filled 2020-02-19 (×4): qty 1

## 2020-02-19 MED ORDER — SUGAMMADEX SODIUM 200 MG/2ML IV SOLN
INTRAVENOUS | Status: DC | PRN
  Administered 2020-02-19 (×2): 200 mg via INTRAVENOUS

## 2020-02-19 MED ORDER — BISACODYL 5 MG PO TBEC
10.0000 mg | DELAYED_RELEASE_TABLET | Freq: Every day | ORAL | Status: DC
  Administered 2020-02-20 – 2020-02-24 (×5): 10 mg via ORAL
  Filled 2020-02-19 (×5): qty 2

## 2020-02-19 MED ORDER — ZOLPIDEM TARTRATE 5 MG PO TABS
5.0000 mg | ORAL_TABLET | Freq: Every evening | ORAL | Status: DC | PRN
  Administered 2020-02-19 – 2020-02-22 (×4): 5 mg via ORAL
  Filled 2020-02-19 (×4): qty 1

## 2020-02-19 MED ORDER — TRAMADOL HCL 50 MG PO TABS: 50.0000 mg | ORAL_TABLET | Freq: Four times a day (QID) | ORAL | Status: DC | PRN

## 2020-02-19 MED ORDER — GLIPIZIDE ER 2.5 MG PO TB24
2.5000 mg | ORAL_TABLET | Freq: Two times a day (BID) | ORAL | Status: DC
  Administered 2020-02-20 – 2020-02-24 (×9): 2.5 mg via ORAL
  Filled 2020-02-19 (×10): qty 1

## 2020-02-19 MED ORDER — BUPIVACAINE HCL (PF) 0.5 % IJ SOLN
INTRAMUSCULAR | Status: AC
  Filled 2020-02-19: qty 30

## 2020-02-19 MED ORDER — VANCOMYCIN HCL IN DEXTROSE 1-5 GM/200ML-% IV SOLN
1000.0000 mg | INTRAVENOUS | Status: AC
  Administered 2020-02-19: 10:00:00 1000 mg via INTRAVENOUS
  Filled 2020-02-19: qty 200

## 2020-02-19 MED ORDER — KETOROLAC TROMETHAMINE 0.5 % OP SOLN
OPHTHALMIC | Status: AC
  Filled 2020-02-19: qty 5

## 2020-02-19 MED ORDER — FENTANYL CITRATE (PF) 250 MCG/5ML IJ SOLN
INTRAMUSCULAR | Status: DC | PRN
  Administered 2020-02-19 (×2): 50 ug via INTRAVENOUS
  Administered 2020-02-19 (×2): 25 ug via INTRAVENOUS
  Administered 2020-02-19: 50 ug via INTRAVENOUS

## 2020-02-19 MED ORDER — ROCURONIUM BROMIDE 10 MG/ML (PF) SYRINGE
PREFILLED_SYRINGE | INTRAVENOUS | Status: AC
  Filled 2020-02-19: qty 10

## 2020-02-19 MED ORDER — BUPIVACAINE LIPOSOME 1.3 % IJ SUSP
20.0000 mL | Freq: Once | INTRAMUSCULAR | Status: DC
  Filled 2020-02-19: qty 20

## 2020-02-19 MED ORDER — SODIUM BICARBONATE 8.4 % IV SOLN
INTRAVENOUS | Status: DC | PRN
  Administered 2020-02-19: 50 meq via INTRAVENOUS

## 2020-02-19 MED ORDER — CALCITRIOL 0.25 MCG PO CAPS
0.5000 ug | ORAL_CAPSULE | Freq: Every day | ORAL | Status: DC
  Administered 2020-02-19 – 2020-02-24 (×6): 0.5 ug via ORAL
  Filled 2020-02-19 (×6): qty 2

## 2020-02-19 MED ORDER — ALBUMIN HUMAN 5 % IV SOLN: INTRAVENOUS | Status: DC | PRN

## 2020-02-19 MED ORDER — METOPROLOL SUCCINATE ER 50 MG PO TB24
50.0000 mg | ORAL_TABLET | Freq: Every day | ORAL | Status: DC
  Administered 2020-02-20 – 2020-02-24 (×5): 50 mg via ORAL
  Filled 2020-02-19 (×5): qty 1

## 2020-02-19 MED ORDER — PHENYLEPHRINE HCL-NACL 10-0.9 MG/250ML-% IV SOLN
INTRAVENOUS | Status: DC | PRN
  Administered 2020-02-19: 20 ug/min via INTRAVENOUS

## 2020-02-19 MED ORDER — ATORVASTATIN CALCIUM 40 MG PO TABS
40.0000 mg | ORAL_TABLET | Freq: Every day | ORAL | Status: DC
  Administered 2020-02-20 – 2020-02-23 (×4): 40 mg via ORAL
  Filled 2020-02-19 (×4): qty 1

## 2020-02-19 MED ORDER — ONDANSETRON HCL 4 MG/2ML IJ SOLN
INTRAMUSCULAR | Status: AC
  Filled 2020-02-19: qty 2

## 2020-02-19 MED ORDER — SODIUM CHLORIDE FLUSH 0.9 % IV SOLN
INTRAVENOUS | Status: DC | PRN
  Administered 2020-02-19: 75 mL

## 2020-02-19 MED ORDER — DEXAMETHASONE SODIUM PHOSPHATE 10 MG/ML IJ SOLN
INTRAMUSCULAR | Status: AC
  Filled 2020-02-19: qty 1

## 2020-02-19 MED ORDER — ASCORBIC ACID 500 MG PO TABS
500.0000 mg | ORAL_TABLET | Freq: Every day | ORAL | Status: DC
  Administered 2020-02-19 – 2020-02-24 (×6): 500 mg via ORAL
  Filled 2020-02-19 (×6): qty 1

## 2020-02-19 MED ORDER — 0.9 % SODIUM CHLORIDE (POUR BTL) OPTIME
TOPICAL | Status: DC | PRN
  Administered 2020-02-19 (×2): 1000 mL

## 2020-02-19 MED ORDER — PROMETHAZINE HCL 25 MG/ML IJ SOLN: 6.2500 mg | INTRAMUSCULAR | Status: DC | PRN

## 2020-02-19 MED ORDER — GABAPENTIN 100 MG PO CAPS
200.0000 mg | ORAL_CAPSULE | Freq: Every day | ORAL | Status: DC
  Administered 2020-02-19 – 2020-02-23 (×5): 200 mg via ORAL
  Filled 2020-02-19 (×5): qty 2

## 2020-02-19 SURGICAL SUPPLY — 92 items
BLADE CLIPPER SURG (BLADE) ×4 IMPLANT
BLADE SURG 11 STRL SS (BLADE) ×4 IMPLANT
BNDG COHESIVE 4X5 TAN STRL (GAUZE/BANDAGES/DRESSINGS) ×4 IMPLANT
CANISTER SUCT 3000ML PPV (MISCELLANEOUS) ×4 IMPLANT
CLEANER TIP ELECTROSURG 2X2 (MISCELLANEOUS) ×4 IMPLANT
CLIP VESOCCLUDE MED 6/CT (CLIP) ×4 IMPLANT
CNTNR URN SCR LID CUP LEK RST (MISCELLANEOUS) ×15 IMPLANT
CONN ST 1/4X3/8  BEN (MISCELLANEOUS) ×8
CONN ST 1/4X3/8 BEN (MISCELLANEOUS) ×6 IMPLANT
CONN Y 3/8X3/8X3/8  BEN (MISCELLANEOUS) ×3
CONN Y 3/8X3/8X3/8 BEN (MISCELLANEOUS) ×3 IMPLANT
CONT SPEC 4OZ STRL OR WHT (MISCELLANEOUS) ×20
COVER BACK TABLE 60X90IN (DRAPES) ×4 IMPLANT
COVER SURGICAL LIGHT HANDLE (MISCELLANEOUS) ×4 IMPLANT
DEFOGGER ANTIFOG KIT (MISCELLANEOUS) ×4 IMPLANT
DERMABOND ADVANCED (GAUZE/BANDAGES/DRESSINGS) ×1
DERMABOND ADVANCED .7 DNX12 (GAUZE/BANDAGES/DRESSINGS) ×3 IMPLANT
DRAIN CHANNEL 32F RND 10.7 FF (WOUND CARE) ×8 IMPLANT
DRAPE CV SPLIT W-CLR ANES SCRN (DRAPES) ×4 IMPLANT
DRAPE ORTHO SPLIT 77X108 STRL (DRAPES) ×3
DRAPE SLUSH/WARMER DISC (DRAPES) ×4 IMPLANT
DRAPE SURG ORHT 6 SPLT 77X108 (DRAPES) ×3 IMPLANT
DRSG XEROFORM 1X8 (GAUZE/BANDAGES/DRESSINGS) ×4 IMPLANT
ELECT BLADE 4.0 EZ CLEAN MEGAD (MISCELLANEOUS) ×4
ELECT BLADE 6.5 EXT (BLADE) ×4 IMPLANT
ELECT REM PT RETURN 9FT ADLT (ELECTROSURGICAL) ×4
ELECTRODE BLDE 4.0 EZ CLN MEGD (MISCELLANEOUS) ×3 IMPLANT
ELECTRODE REM PT RTRN 9FT ADLT (ELECTROSURGICAL) ×3 IMPLANT
GAUZE SPONGE 4X4 12PLY STRL (GAUZE/BANDAGES/DRESSINGS) ×4 IMPLANT
GAUZE SPONGE 4X4 12PLY STRL LF (GAUZE/BANDAGES/DRESSINGS) ×4 IMPLANT
GLOVE BIO SURGEON STRL SZ 6.5 (GLOVE) ×4 IMPLANT
GLOVE BIOGEL PI IND STRL 6.5 (GLOVE) ×3 IMPLANT
GLOVE BIOGEL PI IND STRL 7.5 (GLOVE) ×3 IMPLANT
GLOVE BIOGEL PI IND STRL 8 (GLOVE) ×3 IMPLANT
GLOVE BIOGEL PI INDICATOR 6.5 (GLOVE) ×1
GLOVE BIOGEL PI INDICATOR 7.5 (GLOVE) ×1
GLOVE BIOGEL PI INDICATOR 8 (GLOVE) ×1
GLOVE SURG SIGNA 7.5 PF LTX (GLOVE) ×8 IMPLANT
GLOVE TRIUMPH SURG SIZE 7.5 (KITS) ×4 IMPLANT
GOWN STRL REUS W/ TWL LRG LVL3 (GOWN DISPOSABLE) ×6 IMPLANT
GOWN STRL REUS W/ TWL XL LVL3 (GOWN DISPOSABLE) ×3 IMPLANT
GOWN STRL REUS W/TWL LRG LVL3 (GOWN DISPOSABLE) ×8
GOWN STRL REUS W/TWL XL LVL3 (GOWN DISPOSABLE) ×3
HEMOSTAT SURGICEL 2X14 (HEMOSTASIS) ×4 IMPLANT
KIT BASIN OR (CUSTOM PROCEDURE TRAY) ×4 IMPLANT
KIT CLEAN ENDO COMPLIANCE (KITS) ×4 IMPLANT
KIT SUCTION CATH 14FR (SUCTIONS) ×4 IMPLANT
KIT TURNOVER KIT B (KITS) ×4 IMPLANT
MARKER SKIN DUAL TIP RULER LAB (MISCELLANEOUS) ×4 IMPLANT
NEEDLE HYPO 25GX1X1/2 BEV (NEEDLE) ×4 IMPLANT
NEEDLE SPNL 22GX3.5 QUINCKE BK (NEEDLE) ×4 IMPLANT
NS IRRIG 1000ML POUR BTL (IV SOLUTION) ×8 IMPLANT
OIL SILICONE PENTAX (PARTS (SERVICE/REPAIRS)) ×4 IMPLANT
PACK CHEST (CUSTOM PROCEDURE TRAY) ×4 IMPLANT
PAD ARMBOARD 7.5X6 YLW CONV (MISCELLANEOUS) ×8 IMPLANT
POUCH ENDO CATCH II 15MM (MISCELLANEOUS) IMPLANT
SOL ANTI FOG 6CC (MISCELLANEOUS) ×3 IMPLANT
SOLUTION ANTI FOG 6CC (MISCELLANEOUS) ×1
SPECIMEN JAR MEDIUM (MISCELLANEOUS) ×4 IMPLANT
SPONGE INTESTINAL PEANUT (DISPOSABLE) ×28 IMPLANT
SPONGE TONSIL TAPE 1 RFD (DISPOSABLE) ×4 IMPLANT
STOPCOCK 4 WAY LG BORE MALE ST (IV SETS) ×4 IMPLANT
SUT SILK  1 MH (SUTURE) ×6
SUT SILK 1 MH (SUTURE) ×6 IMPLANT
SUT SILK 3 0SH CR/8 30 (SUTURE) ×4 IMPLANT
SUT VIC AB 1 CTX 36 (SUTURE) ×4
SUT VIC AB 1 CTX36XBRD ANBCTR (SUTURE) ×3 IMPLANT
SUT VIC AB 2-0 CTX 36 (SUTURE) ×8 IMPLANT
SUT VIC AB 2-0 UR6 27 (SUTURE) ×8 IMPLANT
SUT VIC AB 3-0 X1 27 (SUTURE) ×12 IMPLANT
SYR 10ML LL (SYRINGE) ×4 IMPLANT
SYR 20ML ECCENTRIC (SYRINGE) ×8 IMPLANT
SYR 20ML LL LF (SYRINGE) ×4 IMPLANT
SYR 50ML LL SCALE MARK (SYRINGE) ×4 IMPLANT
SYR 5ML LL (SYRINGE) ×4 IMPLANT
SYR 5ML LUER SLIP (SYRINGE) ×4 IMPLANT
SYSTEM SAHARA CHEST DRAIN ATS (WOUND CARE) ×4 IMPLANT
TAPE CLOTH 4X10 WHT NS (GAUZE/BANDAGES/DRESSINGS) ×4 IMPLANT
TAPE CLOTH SURG 4X10 WHT LF (GAUZE/BANDAGES/DRESSINGS) ×4 IMPLANT
TOWEL GREEN STERILE (TOWEL DISPOSABLE) ×4 IMPLANT
TOWEL GREEN STERILE FF (TOWEL DISPOSABLE) ×4 IMPLANT
TRAP SPECIMEN MUCOUS 40CC (MISCELLANEOUS) ×8 IMPLANT
TRAY FOLEY MTR SLVR 16FR STAT (SET/KITS/TRAYS/PACK) ×4 IMPLANT
TROCAR BLADELESS 5M (ENDOMECHANICALS) ×4 IMPLANT
TROCAR BLADELESS 5MM (ENDOMECHANICALS) ×4 IMPLANT
TROCAR XCEL BLADELESS 5X75MML (TROCAR) ×4 IMPLANT
TUBE CONNECTING 20X1/4 (TUBING) ×8 IMPLANT
TUBING EXTENTION W/L.L. (IV SETS) ×4 IMPLANT
VALVE BIOPSY  SINGLE USE (MISCELLANEOUS) ×8
VALVE BIOPSY SINGLE USE (MISCELLANEOUS) ×6 IMPLANT
VALVE SUCTION BRONCHIO DISP (MISCELLANEOUS) ×4 IMPLANT
WATER STERILE IRR 1000ML POUR (IV SOLUTION) ×8 IMPLANT

## 2020-02-19 NOTE — Brief Op Note (Addendum)
02/19/2020  1:53 PM  PATIENT:  John [name]  69 y.o. male  PRE-OPERATIVE DIAGNOSIS:  LEFT PLEURAL EFFUSION  POST-OPERATIVE DIAGNOSIS:  LEFT PLEURAL EFFUSION WITH TRAPPED LUNG  PROCEDURE:  Procedure(s):  VIDEO BRONCHOSCOPY (N/A)  VIDEO ASSISTED THORACOSCOPY  -Drainage of pleural effusion -Decortication of Lung -Intercostal Nerve Blocks (Left)  SURGEON:  Surgeon(s) and Role:    * Melrose Nakayama, MD - Primary  PHYSICIAN ASSISTANT: Ellwood Handler PA-C  ANESTHESIA:   general  EBL:  1000 mL   BLOOD ADMINISTERED:1 unit CC PRBC  DRAINS: 32 Blake x 2   LOCAL MEDICATIONS USED:  BUPIVICAINE   SPECIMEN:  Source of Specimen:  Pleural Fluid, Pleural Peel, Visceral Peel  DISPOSITION OF SPECIMEN:  Microbiology, Pathology  COUNTS:  YES  TOURNIQUET:  * No tourniquets in log *  DICTATION: .Dragon Dictation  PLAN OF CARE: Admit to inpatient   PATIENT DISPOSITION:  PACU - hemodynamically stable.   Delay start of Pharmacological VTE agent (>24hrs) due to surgical blood loss or risk of bleeding: no

## 2020-02-19 NOTE — Op Note (Signed)
NAME: John [name], John [name] MEDICAL RECORD [id number] ACCOUNT [id number] DATE OF BIRTH:1951-06-24 FACILITY: [facility] LOCATION: [facility] PHYSICIAN:Alyxandria Wentz C. Breydon Senters, MD  OPERATIVE REPORT  DATE OF PROCEDURE:  02/19/2020  PREOPERATIVE DIAGNOSIS:  Left pleural effusion.  POSTOPERATIVE DIAGNOSIS:  Left pleural effusion with trapped left lung.  PROCEDURES:   1.  Bronchoscopy 2.  Left video-assisted thoracoscopy. 3.  Drainage of pleural effusion.   4.  Visceral and parietal pleural decortication.   5.  Intercostal nerve blocks at levels 3 through 10.  SURGEON:  Modesto Charon, MD  ASSISTANT:  Ellwood Handler, PA  ANESTHESIA:  General.  FINDINGS:  Loculated posterolateral pleural effusion with dense visceral and parietal pleural peels.  Good reexpansion of the lung post-decortication.  CLINICAL NOTE:  John [name] is a 69 year old man with multiple medical problems who presented with progressive shortness of breath.  Chest x-ray showed a loculated left pleural effusion.  Thoracentesis was performed.  Fluid was consistent with exudate,  but the fluid rapidly recurred.  He also had a great deal of pain after the thoracentesis.  He had minimal symptomatic improvement.  He was advised to undergo left VATS for drainage of the effusion and decortication of the lung.  The indications, risks,  benefits, and alternatives were discussed in detail with the patient.  He understood and accepted the risks and agreed to proceed.  He also was advised to have a bronchoscopy at the beginning of the procedure to rule out any endobronchial lesions.  OPERATIVE NOTE:  John [name] was brought to the preoperative holding area on 02/19/2020.  Anesthesia placed a central venous catheter and an arterial blood pressure monitoring line.  He was taken to the Operating Room, anesthetized and intubated.   Intravenous antibiotics were administered.  A Foley catheter was placed.  Sequential compression devices were  placed on the calves for DVT prophylaxis.  A timeout was performed.  Flexible fiberoptic bronchoscopy was performed via the endotracheal tube.  It revealed normal endobronchial anatomy with no endobronchial lesions to the level of the subsegmental bronchi.  The bronchoscope was removed.  The patient was reintubated with a double lumen endotracheal tube.  He was placed in a right lateral decubitus position.  The left chest was prepped and draped in the usual sterile fashion.  Single-lung ventilation of the right lung was initiated and was  tolerated well throughout the procedure.  A timeout was performed.  A solution containing 20 mL of liposomal bupivacaine, 30 mL of 0.5% bupivacaine and 50 mL of saline was prepared.  This was used for local at the incision sites as well as for the intercostal nerve blocks.  An incision was made  in the 8th interspace in the midaxillary line.  The  chest was bluntly entered using a hemostat.  A sucker was placed into the chest and  blood-tinged pleural fluid was evacuated.  Approximately a liter of fluid was present.  A 5 cm working incision was  made in the 5th interspace anterolaterally.  No rib spreading was performed during the procedure.  A second port incision was made anterior to the first for retraction purposes.  After evacuating all the pleural effusion, there was a thick peel present  on the lower half of the upper lobe and the entire lower lobe.  There also was a thick peel over the parietal pleura in the lower two-thirds of the chest.  A biopsy was taken of the parietal pleural peel and then sent for permanent pathology.  The  decortication then was  begun on the diaphragmatic surface of the lung.  This was a slow and tedious process.  The peel was almost a centimeter thick in some areas and approximately 4-5 mm thick over the majority of the lower lobe.  The vast majority of  the peel was removed.  In some areas where the peel could not be removed, it was  scored with electrocautery to allow expansion of the underlying lung.  The fissure was decorticated, as was the lingular portion of the upper lobe.  More superiorly in the  upper lobe, a plane could not be developed and this area was scored with cautery as well.  There were multiple superficial pleural tears.  Dual-lung ventilation was resumed and additional areas that were restricted were freed up with additional decortication and  dividing adhesions with cautery, respectively.  After achieving adequate reexpansion, the chest was copiously irrigated with warm saline.  Hemostasis was achieved.  Intercostal nerve blocks were performed from the 3rd to the 10th interspace, injecting  the bupivacaine solution into a subpleural plane at each level.  Two 32-French Blake drains were placed through the port incisions and secured with #1 silk sutures.  The working incision was closed in standard fashion in 3 layers.  The chest tube was  placed to suction.  The patient then was placed back in the supine position.  He was extubated in the operating room and taken to the [place] [facility] in good condition.  All sponge, needle and instrument counts were correct at the end of the  procedure.  VN/NUANCE  D:02/19/2020 T:02/19/2020 JOB:[id number]

## 2020-02-19 NOTE — Transfer of Care (Signed)
Immediate Anesthesia Transfer of Care Note  Patient: John [name]  Procedure(s) Performed: VIDEO BRONCHOSCOPY (N/A ) VIDEO ASSISTED THORACOSCOPY (Left Chest) DRAINAGE OF PLEURAL EFFUSION (Left ) DECORTICATION (Left ) Intercostal Nerve Block (Left Chest)  Patient Location: PACU  Anesthesia Type:General  Level of Consciousness: drowsy  Airway & Oxygen Therapy: Patient Spontanous Breathing and Patient connected to face mask oxygen  Post-op Assessment: Report given to RN and Post -op Vital signs reviewed and stable  Post vital signs: Reviewed  Last Vitals:  Vitals Value Taken Time  BP 126/64 02/19/20 1423  Temp    Pulse 66 02/19/20 1433  Resp 14 02/19/20 1433  SpO2 100 % 02/19/20 1433  Vitals shown include unvalidated device data.  Last Pain:  Vitals:   02/19/20 0950  TempSrc:   PainSc: 0-No pain      Patients Stated Pain Goal: 2 (84/66/59 9357)  Complications: No apparent anesthesia complications

## 2020-02-19 NOTE — Anesthesia Preprocedure Evaluation (Addendum)
Anesthesia Evaluation  Patient identified by MRN, date of birth, ID band Patient awake    Reviewed: Allergy & Precautions, H&P , NPO status , Patient's Chart, lab work & pertinent test results, reviewed documented beta blocker date and time   Airway Mallampati: II  TM Distance: >3 FB Neck ROM: Full    Dental no notable dental hx. (+) Teeth Intact, Dental Advisory Given   Pulmonary shortness of breath, former smoker,    Pulmonary exam normal        Cardiovascular hypertension, Pt. on medications and Pt. on home beta blockers + CAD, + Past MI and + Cardiac Stents  Normal cardiovascular exam+ Valvular Problems/Murmurs AS   Echo 1. Left ventricular ejection fraction, by estimation, is 40 to 45%. The left ventricle has mildly decreased function. The left ventricle demonstrates regional wall motion abnormalities (see scoring diagram/findings for description). There is mild concentric left ventricular hypertrophy. Left ventricular diastolic function could not be evaluated.  2. Right ventricular systolic function is normal. The right ventricular size is normal.  3. No left atrial/left atrial appendage thrombus was detected.  4. The mitral valve is normal in structure and function. Mild mitral valve regurgitation. No evidence of mitral stenosis.  5. The aortic valve is normal in structure and function. Aortic valve regurgitation is not visualized. Mild aortic valve sclerosis is present, with no evidence of aortic valve stenosis.  6. The inferior vena cava is normal in size with greater than 50% respiratory variability, suggesting right atrial pressure of 3 mmHg.     Neuro/Psych negative neurological ROS  negative psych ROS   GI/Hepatic negative GI ROS, Neg liver ROS,   Endo/Other  diabetes, Insulin Dependent, Oral Hypoglycemic Agents  Renal/GU CRFRenal disease     Musculoskeletal  (+) Arthritis ,   Abdominal   Peds  Hematology negative hematology ROS (+)   Anesthesia Other Findings   Reproductive/Obstetrics                            Anesthesia Physical  Anesthesia Plan  ASA: III  Anesthesia Plan: General   Post-op Pain Management:    Induction: Intravenous  PONV Risk Score and Plan: 3 and Ondansetron, Dexamethasone and Treatment may vary due to age or medical condition  Airway Management Planned: Oral ETT  Additional Equipment: Arterial line, CVP and Ultrasound Guidance Line Placement  Intra-op Plan:   Post-operative Plan: Extubation in OR  Informed Consent: I have reviewed the patients History and Physical, chart, labs and discussed the procedure including the risks, benefits and alternatives for the proposed anesthesia with the patient or authorized representative who has indicated his/her understanding and acceptance.     Dental advisory given  Plan Discussed with: CRNA  Anesthesia Plan Comments:         Anesthesia Quick Evaluation

## 2020-02-19 NOTE — Plan of Care (Signed)
  Problem: Education: Goal: Knowledge of disease or condition will improve Outcome: Progressing   Problem: Activity: Goal: Risk for activity intolerance will decrease Outcome: Progressing   Problem: Cardiac: Goal: Will achieve and/or maintain hemodynamic stability Outcome: Progressing   Problem: Clinical Measurements: Goal: Postoperative complications will be avoided or minimized Outcome: Progressing   Problem: Respiratory: Goal: Respiratory status will improve Outcome: Progressing   Problem: Pain Management: Goal: Pain level will decrease Outcome: Progressing

## 2020-02-19 NOTE — Addendum Note (Signed)
Addendum  created 02/19/20 1908 by Annye Asa, MD   Clinical Note Signed

## 2020-02-19 NOTE — Anesthesia Procedure Notes (Signed)
Arterial Line Insertion Start/End3/29/2021 10:30 AM Performed by: Janene Harvey, CRNA, CRNA  Preanesthetic checklist: patient identified, IV checked, site marked, risks and benefits discussed, surgical consent, monitors and equipment checked, pre-op evaluation, timeout performed and anesthesia consent Lidocaine 1% used for infiltration Left, radial was placed Catheter size: 20 G Hand hygiene performed  and maximum sterile barriers used  Allen's test indicative of satisfactory collateral circulation Attempts: 1 Procedure performed without using ultrasound guided technique. Following insertion, Biopatch and dressing applied. Post procedure assessment: normal  Patient tolerated the procedure well with no immediate complications.

## 2020-02-19 NOTE — Anesthesia Postprocedure Evaluation (Signed)
Anesthesia Post Note  Patient: John [name]  Procedure(s) Performed: VIDEO BRONCHOSCOPY (N/A ) VIDEO ASSISTED THORACOSCOPY (Left Chest) DRAINAGE OF PLEURAL EFFUSION (Left ) DECORTICATION (Left ) Intercostal Nerve Block (Left Chest)     Patient location during evaluation: PACU Anesthesia Type: General Level of consciousness: awake and alert Pain management: pain level controlled Vital Signs Assessment: post-procedure vital signs reviewed and stable Respiratory status: spontaneous breathing, nonlabored ventilation, respiratory function stable and patient connected to nasal cannula oxygen Cardiovascular status: blood pressure returned to baseline and stable Postop Assessment: no apparent nausea or vomiting Anesthetic complications: no    Last Vitals:  Vitals:   02/19/20 1508 02/19/20 1538  BP: 104/62 113/62  Pulse: 67 72  Resp: 15 16  Temp:    SpO2: 93% 92%    Last Pain:  Vitals:   02/19/20 1538  TempSrc:   PainSc: 0-No pain                 Barnet Glasgow

## 2020-02-19 NOTE — Interval H&P Note (Signed)
History and Physical Interval Note:  02/19/2020 9:53 AM  John [name]  has presented today for surgery, with the diagnosis of LEFT PLEURAL EFFUSION.  The various methods of treatment have been discussed with the patient and family. After consideration of risks, benefits and other options for treatment, the patient has consented to  Procedure(s): VIDEO BRONCHOSCOPY (N/A) VIDEO ASSISTED THORACOSCOPY (Left) DRAINAGE OF PLEURAL EFFUSION (Left) DECORTICATION (Left) as a surgical intervention.  The patient's history has been reviewed, patient examined, no change in status, stable for surgery.  I have reviewed the patient's chart and labs.  Questions were answered to the patient's satisfaction.     Melrose Nakayama

## 2020-02-19 NOTE — Anesthesia Procedure Notes (Signed)
Procedure Name: Intubation Date/Time: 02/19/2020 11:08 AM Performed by: Janene Harvey, CRNA Pre-anesthesia Checklist: Patient identified, Emergency Drugs available, Suction available and Patient being monitored Patient Re-evaluated:Patient Re-evaluated prior to induction Oxygen Delivery Method: Circle system utilized Preoxygenation: Pre-oxygenation with 100% oxygen Induction Type: IV induction Ventilation: Mask ventilation without difficulty Laryngoscope Size: Mac and 4 Grade View: Grade I Tube type: Oral Tube size: 8.5 mm Number of attempts: 1 Airway Equipment and Method: Stylet Placement Confirmation: ETT inserted through vocal cords under direct vision,  positive ETCO2 and breath sounds checked- equal and bilateral Secured at: 24 cm Tube secured with: Tape Dental Injury: Teeth and Oropharynx as per pre-operative assessment  Comments: Inserted by Rexford Maus, SRNA

## 2020-02-19 NOTE — Anesthesia Procedure Notes (Addendum)
Procedure Name: Intubation Date/Time: 02/19/2020 11:22 AM Performed by: Janene Harvey, CRNA Pre-anesthesia Checklist: Patient identified, Emergency Drugs available, Suction available and Patient being monitored Patient Re-evaluated:Patient Re-evaluated prior to induction Oxygen Delivery Method: Circle system utilized Preoxygenation: Pre-oxygenation with 100% oxygen Induction Type: IV induction Ventilation: Mask ventilation without difficulty Laryngoscope Size: Mac and 4 Grade View: Grade I Tube type: Oral Endobronchial tube: Left and Double lumen EBT and 39 Fr Number of attempts: 1 Airway Equipment and Method: Stylet and Fiberoptic brochoscope Placement Confirmation: ETT inserted through vocal cords under direct vision,  positive ETCO2 and breath sounds checked- equal and bilateral Secured at: 29 cm Tube secured with: Tape Dental Injury: Teeth and Oropharynx as per pre-operative assessment  Comments: Intubation by [place]

## 2020-02-19 NOTE — Addendum Note (Signed)
Addendum  created 02/19/20 1831 by Annye Asa, MD   Order list changed

## 2020-02-19 NOTE — Progress Notes (Signed)
Post op/PACU: Called to see patient complaining of scratchiness and pain in R eye. Pt is in PACU, s/p VATS decortication of pleural effusion. Otherwise, pt has no complaints.   On exam, both eyes are not red or tearing. There is no apparent foreign body in the right eye.   Suspect superficial corneal abrasion. Ketorolac anti-inflammatory drops ordered. Expect total resolution in a few days. Discussed with patient, who will follow-up with [place] if symptoms don't resolve with Ketorolac drops by the tim eof discharge.   Jenita Seashore, MD   [phone]

## 2020-02-19 NOTE — Anesthesia Procedure Notes (Signed)
Central Venous Catheter Insertion Performed by: Nolon Nations, MD, anesthesiologist Start/End3/29/2021 10:35 AM, 02/19/2020 10:50 AM Patient location: Pre-op. Preanesthetic checklist: patient identified, IV checked, site marked, risks and benefits discussed, surgical consent, monitors and equipment checked, pre-op evaluation, timeout performed and anesthesia consent Position: Trendelenburg Lidocaine 1% used for infiltration and patient sedated Hand hygiene performed  and maximum sterile barriers used  Catheter size: 8 Fr Total catheter length 16. Central line was placed.Double lumen Procedure performed using ultrasound guided technique. Ultrasound Notes:image(s) printed for medical record Attempts: 1 Following insertion, dressing applied, line sutured and Biopatch. Post procedure assessment: blood return through all ports, free fluid flow and no air  Patient tolerated the procedure well with no immediate complications.

## 2020-02-20 ENCOUNTER — Inpatient Hospital Stay (HOSPITAL_COMMUNITY): Payer: Medicare HMO

## 2020-02-20 DIAGNOSIS — Z09 Encounter for follow-up examination after completed treatment for conditions other than malignant neoplasm: Secondary | ICD-10-CM

## 2020-02-20 HISTORY — DX: Encounter for follow-up examination after completed treatment for conditions other than malignant neoplasm: Z09

## 2020-02-20 LAB — GLUCOSE, CAPILLARY
Glucose-Capillary: 104 mg/dL — ABNORMAL HIGH (ref 70–99)
Glucose-Capillary: 105 mg/dL — ABNORMAL HIGH (ref 70–99)
Glucose-Capillary: 145 mg/dL — ABNORMAL HIGH (ref 70–99)
Glucose-Capillary: 180 mg/dL — ABNORMAL HIGH (ref 70–99)
Glucose-Capillary: 196 mg/dL — ABNORMAL HIGH (ref 70–99)
Glucose-Capillary: 202 mg/dL — ABNORMAL HIGH (ref 70–99)

## 2020-02-20 LAB — CBC
HCT: 34.6 % — ABNORMAL LOW (ref 39.0–52.0)
Hemoglobin: 10.6 g/dL — ABNORMAL LOW (ref 13.0–17.0)
MCH: 26.8 pg (ref 26.0–34.0)
MCHC: 30.6 g/dL (ref 30.0–36.0)
MCV: 87.4 fL (ref 80.0–100.0)
Platelets: 188 10*3/uL (ref 150–400)
RBC: 3.96 MIL/uL — ABNORMAL LOW (ref 4.22–5.81)
RDW: 15.3 % (ref 11.5–15.5)
WBC: 11.8 10*3/uL — ABNORMAL HIGH (ref 4.0–10.5)
nRBC: 0 % (ref 0.0–0.2)

## 2020-02-20 LAB — ACID FAST SMEAR (AFB, MYCOBACTERIA): Acid Fast Smear: NEGATIVE

## 2020-02-20 LAB — BASIC METABOLIC PANEL
Anion gap: 11 (ref 5–15)
BUN: 56 mg/dL — ABNORMAL HIGH (ref 8–23)
CO2: 18 mmol/L — ABNORMAL LOW (ref 22–32)
Calcium: 9.2 mg/dL (ref 8.9–10.3)
Chloride: 111 mmol/L (ref 98–111)
Creatinine, Ser: 5.03 mg/dL — ABNORMAL HIGH (ref 0.61–1.24)
GFR calc Af Amer: 13 mL/min — ABNORMAL LOW (ref >60)
GFR calc non Af Amer: 11 mL/min — ABNORMAL LOW (ref >60)
Glucose, Bld: 110 mg/dL — ABNORMAL HIGH (ref 70–99)
Potassium: 5 mmol/L (ref 3.5–5.1)
Sodium: 140 mmol/L (ref 135–145)

## 2020-02-20 MED ORDER — IPRATROPIUM-ALBUTEROL 0.5-2.5 (3) MG/3ML IN SOLN
3.0000 mL | Freq: Two times a day (BID) | RESPIRATORY_TRACT | Status: DC
  Administered 2020-02-20 – 2020-02-21 (×2): 3 mL via RESPIRATORY_TRACT
  Filled 2020-02-20 (×2): qty 3

## 2020-02-20 MED ORDER — SODIUM CHLORIDE 0.45 % IV SOLN: INTRAVENOUS | Status: DC

## 2020-02-20 MED ORDER — ENOXAPARIN SODIUM 30 MG/0.3ML ~~LOC~~ SOLN
30.0000 mg | Freq: Every day | SUBCUTANEOUS | Status: DC
  Administered 2020-02-20 – 2020-02-23 (×4): 30 mg via SUBCUTANEOUS
  Filled 2020-02-20 (×5): qty 0.3

## 2020-02-20 MED ORDER — IPRATROPIUM-ALBUTEROL 0.5-2.5 (3) MG/3ML IN SOLN
3.0000 mL | Freq: Three times a day (TID) | RESPIRATORY_TRACT | Status: DC
  Administered 2020-02-20 (×2): 3 mL via RESPIRATORY_TRACT
  Filled 2020-02-20 (×2): qty 3

## 2020-02-20 NOTE — Progress Notes (Addendum)
[address]       [place] [address]             [phone]      1 Day Post-Op Procedure(s) (LRB): VIDEO BRONCHOSCOPY (N/A) VIDEO ASSISTED THORACOSCOPY (Left) DRAINAGE OF PLEURAL EFFUSION (Left) DECORTICATION (Left) Intercostal Nerve Block (Left)   Subjective:  The patient has pain along his right side.  He denies N/V.  States he doesn't want eat because he is fearful he wont make to bathroom for a bowel movement.    Objective: Vital signs in last 24 hours: Temp:  [96.6 F (35.9 C)-98.3 F (36.8 C)] 98 F (36.7 C) (03/30 0400) Pulse Rate:  [66-94] 81 (03/30 0713) Cardiac Rhythm: Heart block (03/29 2000) Resp:  [11-20] 15 (03/30 0713) BP: (104-167)/(54-88) 142/76 (03/30 0713) SpO2:  [92 %-100 %] 98 % (03/30 0713) Arterial Line BP: (122-193)/(45-73) 193/73 (03/29 1823) Weight:  [95.4 kg] 95.4 kg (03/29 0953)  Intake/Output from previous day: 03/29 0701 - 03/30 0700 In: 3690 [I.V.:2250; Blood:940; IV Piggyback:500] Out: 5102 [Urine:1200; Blood:1000; Chest Tube:1630]  General appearance: alert, cooperative and no distress Heart: regular rate and rhythm Lungs: diminished breath sounds bibasilar Abdomen: soft, non-tender; bowel sounds normal; no masses,  no organomegaly Extremities: extremities normal, atraumatic, no cyanosis or edema Wound: clean and dry  Lab Results: Recent Labs    02/19/20 1358 02/19/20 1403  HGB 8.8* 9.9*  HCT 26.0* 29.0*   BMET:  Recent Labs    02/19/20 1016 02/19/20 1016 02/19/20 1232 02/19/20 1259 02/19/20 1358 02/19/20 1403  NA 144   < > 142   < > 142 142  K 4.9   < > 4.9   < > 5.6* 5.7*  CL 113*  --  114*  --   --   --   GLUCOSE 100*  --  119*  --   --   --   BUN 51*  --  51*  --   --   --   CREATININE 5.90*  --  5.60*  --   --   --    < > = values in this interval not displayed.    PT/INR: No results for input(s): LABPROT, INR in the last 72 hours. ABG    Component Value Date/Time   PHART 7.327 (L)  02/19/2020 1843   HCO3 17.7 (L) 02/19/2020 1843   TCO2 22 02/19/2020 1403   ACIDBASEDEF 7.2 (H) 02/19/2020 1843   O2SAT 93.4 02/19/2020 1843   CBG (last 3)  Recent Labs    02/19/20 2004 02/20/20 0015 02/20/20 0438  GLUCAP 147* 145* 104*    Assessment/Plan: S/P Procedure(s) (LRB): VIDEO BRONCHOSCOPY (N/A) VIDEO ASSISTED THORACOSCOPY (Left) DRAINAGE OF PLEURAL EFFUSION (Left) DECORTICATION (Left) Intercostal Nerve Block (Left)  1. CV- NSR, + HTN- continue Toprol XL, Norvasc, Hydralazine 2. Pulm- 1700 ml of output since surgery, + air leak leave on suction. unable to view CXR have contact Radiology who is working on resolving the issues 3. Pain- patient getting some relief with pain medication, not a candidate for Toradol with renal issues, continue current regimen, encouraged patient to use oral agents in addition to PCA 4. Lovenox for DVT prophylaxis 5. Dispo- patient stable, on home cardiac medications, labs not drawn as ordered overnight.. will review results once completed... CXR completed however no images available.. will review once available, CT with air leak leave on suction today   LOS: 1 day    Ellwood Handler, PA-C  02/20/2020  Patient seen and examined, agree with above He has high output from chest tube- it is blood tinged serous drainage CXR is Ok, there may be a small lateral basilar pneumo, but difficult to tell No air leak when I examined him but there was earlier, keep CT on suction for now Hct 34- expected acute blood loss anemia Mobilize  Madyn Ivins C. Roxan Hockey, MD [facility] [phone]

## 2020-02-20 NOTE — Discharge Summary (Addendum)
Physician Discharge Summary  Patient ID: John [name] MRN: [id number] DOB/AGE: [birth year] 69 y.o.  Admit date: 02/19/2020 Discharge date: 02/24/2020  Admission Diagnoses: Recurrent left pleural effusion with trapped lung  Patient Active Problem List   Diagnosis Date Noted  . Chronic infective endocarditis   . History of endocarditis 01/09/2020  . Pleural effusion on left 01/09/2020  . Cardiomyopathy ([place]) 01/09/2020  . S/P CABG x 4 04/03/2019  . Chest pain 03/24/2019  . CKD (chronic kidney disease), stage V ([place]) 03/24/2019  . NSTEMI (non-ST elevated myocardial infarction) ([place]) 03/24/2019  . Chronic kidney disease (CKD), stage IV (severe) ([place]) 12/01/2018  . MSSA bacteremia 09/10/2017  . Low back pain 09/07/2017  . Acute on chronic kidney failure ([place]) 09/07/2017  . DM (diabetes mellitus), type 2 with renal complications ([place]) 70/96/2836  . Essential hypertension 09/07/2017  . HLD (hyperlipidemia) 09/07/2017  . Back pain 09/07/2017  . Fever    Discharge Diagnoses: Recurrent left pleural effusion with trapped lung  Patient Active Problem List   Diagnosis Date Noted  . S/P Video Bronchoscopy, Right VATS with drainage of pleural effusion, decortication, intercostal nerve block 02/20/2020  . Chronic infective endocarditis   . History of endocarditis 01/09/2020  . Pleural effusion on left 01/09/2020  . Cardiomyopathy ([place]) 01/09/2020  . S/P CABG x 4 04/03/2019  . Chest pain 03/24/2019  . CKD (chronic kidney disease), stage V ([place]) 03/24/2019  . NSTEMI (non-ST elevated myocardial infarction) ([place]) 03/24/2019  . Chronic kidney disease (CKD), stage IV (severe) ([place]) 12/01/2018  . MSSA bacteremia 09/10/2017  . Low back pain 09/07/2017  . Acute on chronic kidney failure ([place]) 09/07/2017  . DM (diabetes mellitus), type 2 with renal complications ([place]) 62/94/7654  . Essential hypertension 09/07/2017  . HLD (hyperlipidemia) 09/07/2017  . Back pain 09/07/2017  . Fever     Discharged Condition: good  History of Present Illness:  John [name] is a 69 yo male with PMH for CAD, H/O NSTEMI, S/P CABG x 4 performed 03/2019, stage V CKD, HTN, Hyperlipidemia, Type 2 DM with renal complications, and chronic low back pain.  The patient was initially noted to have a small left pleural effusion on his post operative CXR in May.  He was given steroids.  Follow up of effusion in July showed near complete resolution of left sided pleural effusion.  The patient developed COVID-10 shortly after.  He developed shortness of breath with exertion.  He followed up with Dr. Agustin Cree in February at which time he admitted to becoming progressively short of breath.  CXR obtained showed a loculated left pleural effusion.  Echocardiogram was also obtained and showed possible endocarditis.  Follow up TEE showed no evidence of suggest endocarditis.  He was referred to Dr. Erskine Emery who sent patient for a Thoracentesis which removed 600 ml of fluid.  The fluid was bloody with an elevated LDH of 219 consistent with exudate.  The patient had significant pain post procedure.  He was breathing a little better.  He was referred to Dr. Roxan Hockey for possible surgical intervention.  He was evaluated on 02/07/2020 at which time he admitted to continued shortness of breath with exertion.  He has a cough when lying down which results in left sided chest pain.  It was felt the patient should undergo a bronchoscopy with VATS procedure for diagnosis and removal of loculated fluid for better expansion of the lung.  The risks and benefits of the procedure were explained to the patient and he was  agreeable to proceed.      Hospital Course:   John [name] presented to [facility] on [facility]/19/2020.  He was taken to the operating room and underwent Video Bronchoscopy, Left VATS with drainage of empyema, decortication, and intercostal nerve block.  The patient tolerated the procedure well, was extubated, and taken  to the SICU in stable condition.  The patient progressed well post operatively.  He had a small air leak which resolved without difficulty.  He was transitioned to water seal and as CT output decreased his chest tubes were removed on 02/22/2020 and 02/23/2020.  Follow up CXR showed residual left pleural effusion and atelectasis.  His cultures obtained in the operating room remain negative.  He has remained hemodynamically stable on his home BP medications.  His creatinine level remained stable in his baseline range of 5.0-5.9.  He is ambulating independently.  His incisions are healing without evidence of infection.  He is medically stable for discharge home today.   Significant Diagnostic Studies: radiology:   1. Moderate loculated left pleural effusion with pleural thickening. Left lower lobe consolidation is likely in part compressive, with probable round atelectasis superiorly. Additional area of subpleural opacity in the lingula may represent atelectasis, however has slightly spiculated margins. Pneumonia or malignancy is not excluded. Recommend correlation with recent thoracentesis results. 2. Innumerable tiny nodules throughout the right lung, all in the 3-6 mm range. This is likely infectious or inflammatory, however metastatic disease is not entirely excluded. 3. Unchanged saccular lesion in the distal aortic arch. No evidence of acute aortic inflammation on noncontrast exam. 4. Multiple prominent mediastinal lymph nodes, likely reactive but nonspecific. 5. Dense coronary artery calcifications post CABG. 6. Fusiform ascending aortic aneurysm. Recommend annual imaging followup by CTA or MRA. This recommendation follows 2010 ACCF/AHA/AATS/ACR/ASA/SCA/SCAI/SIR/STS/SVM Guidelines for the Diagnosis and Management of Patients with Thoracic Aortic Disease. Circulation. 2010; 121: Y174-B449. Aortic aneurysm NOS (ICD10-I71.9)  Treatments: surgery:   PROCEDURES:   1.  Left video-assisted  thoracoscopy. 2.  Drainage of pleural effusion.   3.  Visceral and parietal pleural decortication.   4.  Intercostal nerve blocks at levels 3 through 10.  Discharge Exam: Blood pressure (!) 142/79, pulse 92, temperature 97.9 F (36.6 C), temperature source Oral, resp. rate (!) 21, height 6\' 1"  (1.854 m), weight 95.4 kg, SpO2 100 %.  General appearance: alert, cooperative and no distress Heart: regular rate and rhythm Lungs: dim left lower fields Abdomen: benign Extremities: no calf tenderness Wound: incis healing well    Discharge Medications:  Discharge Instructions    Discharge patient   Complete by: As directed    Discharge disposition: 01-Home or Self Care   Discharge patient date: 02/24/2020     Allergies as of 02/24/2020      Reactions   Penicillins Rash, Other (See Comments)   Did it involve swelling of the face/tongue/throat, SOB, or low BP? No Did it involve sudden or severe rash/hives, skin peeling, or any reaction on the inside of your mouth or nose? Yes Did you need to seek medical attention at a hospital or doctor's office? Yes When did it last happen?Childhood allergy If all above answers are "NO", may proceed with cephalosporin use.      Medication List    TAKE these medications   acetaminophen 500 MG tablet Commonly known as: TYLENOL Take 1,500 mg by mouth 2 (two) times daily as needed (pain).   albuterol 108 (90 Base) MCG/ACT inhaler Commonly known as: VENTOLIN HFA Inhale 2 puffs  into the lungs every 6 (six) hours as needed for wheezing or shortness of breath.   amLODipine 5 MG tablet Commonly known as: NORVASC Take 5 mg by mouth daily.   ascorbic acid 500 MG tablet Commonly known as: VITAMIN C Take 500 mg by mouth daily.   aspirin EC 81 MG tablet Take 81 mg by mouth daily.   atorvastatin 40 MG tablet Commonly known as: LIPITOR Take 40 mg by mouth daily in the afternoon.   calcitRIOL 0.5 MCG capsule Commonly known as: ROCALTROL Take  0.5 mcg by mouth daily.   ferrous sulfate 325 (65 FE) MG tablet Take 325 mg by mouth 2 (two) times daily with a meal.   fluticasone 50 MCG/ACT nasal spray Commonly known as: FLONASE Place 1 spray into both nostrils daily as needed for allergies or rhinitis.   gabapentin 100 MG capsule Commonly known as: NEURONTIN Take 2 capsules (200 mg total) by mouth at bedtime.   glipiZIDE 2.5 MG 24 hr tablet Commonly known as: GLUCOTROL XL Take 2.5 mg by mouth 2 (two) times daily.   hydrALAZINE 10 MG tablet Commonly known as: APRESOLINE TAKE 1 TABLET BY MOUTH THREE TIMES A DAY   linagliptin 5 MG Tabs tablet Commonly known as: TRADJENTA Take 5 mg by mouth daily.   metoprolol succinate 50 MG 24 hr tablet Commonly known as: TOPROL-XL Take 50 mg by mouth daily.   OVER THE COUNTER MEDICATION Place 1 drop into both eyes 2 (two) times daily as needed (dry eyes). Hyper Tears eye drops   rOPINIRole 0.25 MG tablet Commonly known as: REQUIP Take 0.25 mg by mouth at bedtime.   sevelamer carbonate 800 MG tablet Commonly known as: RENVELA Take 1 tablet (800 mg total) by mouth 3 (three) times daily with meals.   tamsulosin 0.4 MG Caps capsule Commonly known as: FLOMAX Take 0.4 mg by mouth daily before supper.   traMADol 50 MG tablet Commonly known as: ULTRAM Take 1 tablet (50 mg total) by mouth every 6 (six) hours as needed for up to 7 days (mild pain).   Trulicity 1.5 TM/2.2QJ Sopn Generic drug: Dulaglutide Inject 1.5 mg into the skin every Sunday.   zolpidem 10 MG tablet Commonly known as: AMBIEN Take 10 mg by mouth at bedtime.      Follow-up Information    Melrose Nakayama, MD Follow up on 03/12/2020.   Specialty: Cardiothoracic Surgery Why: Appointment is at 2:00, please get CXR at 1:30 at [facility] located on first floor of our office building Contact information: [place] [address] [phone]           Signed: John Giovanni PA-C 02/24/2020, 8:16 AM

## 2020-02-21 ENCOUNTER — Inpatient Hospital Stay (HOSPITAL_COMMUNITY): Payer: Medicare HMO

## 2020-02-21 LAB — COMPREHENSIVE METABOLIC PANEL
ALT: 11 U/L (ref 0–44)
AST: 7 U/L — ABNORMAL LOW (ref 15–41)
Albumin: 2.6 g/dL — ABNORMAL LOW (ref 3.5–5.0)
Alkaline Phosphatase: 65 U/L (ref 38–126)
Anion gap: 10 (ref 5–15)
BUN: 61 mg/dL — ABNORMAL HIGH (ref 8–23)
CO2: 16 mmol/L — ABNORMAL LOW (ref 22–32)
Calcium: 8.8 mg/dL — ABNORMAL LOW (ref 8.9–10.3)
Chloride: 107 mmol/L (ref 98–111)
Creatinine, Ser: 5.25 mg/dL — ABNORMAL HIGH (ref 0.61–1.24)
GFR calc Af Amer: 12 mL/min — ABNORMAL LOW (ref >60)
GFR calc non Af Amer: 11 mL/min — ABNORMAL LOW (ref >60)
Glucose, Bld: 88 mg/dL (ref 70–99)
Potassium: 4.6 mmol/L (ref 3.5–5.1)
Sodium: 133 mmol/L — ABNORMAL LOW (ref 135–145)
Total Bilirubin: 0.7 mg/dL (ref 0.3–1.2)
Total Protein: 5.5 g/dL — ABNORMAL LOW (ref 6.5–8.1)

## 2020-02-21 LAB — GLUCOSE, CAPILLARY
Glucose-Capillary: 110 mg/dL — ABNORMAL HIGH (ref 70–99)
Glucose-Capillary: 135 mg/dL — ABNORMAL HIGH (ref 70–99)
Glucose-Capillary: 93 mg/dL (ref 70–99)

## 2020-02-21 LAB — SURGICAL PATHOLOGY

## 2020-02-21 LAB — CBC
HCT: 34.5 % — ABNORMAL LOW (ref 39.0–52.0)
Hemoglobin: 10.4 g/dL — ABNORMAL LOW (ref 13.0–17.0)
MCH: 26.7 pg (ref 26.0–34.0)
MCHC: 30.1 g/dL (ref 30.0–36.0)
MCV: 88.7 fL (ref 80.0–100.0)
Platelets: 186 10*3/uL (ref 150–400)
RBC: 3.89 MIL/uL — ABNORMAL LOW (ref 4.22–5.81)
RDW: 15.6 % — ABNORMAL HIGH (ref 11.5–15.5)
WBC: 10.3 10*3/uL (ref 4.0–10.5)
nRBC: 0 % (ref 0.0–0.2)

## 2020-02-21 MED ORDER — SODIUM CHLORIDE 0.9% FLUSH
10.0000 mL | Freq: Two times a day (BID) | INTRAVENOUS | Status: DC
  Administered 2020-02-21 – 2020-02-22 (×2): 10 mL
  Administered 2020-02-22: 20 mL
  Administered 2020-02-23 – 2020-02-24 (×3): 10 mL

## 2020-02-21 MED ORDER — SODIUM CHLORIDE 0.9% FLUSH
10.0000 mL | INTRAVENOUS | Status: DC | PRN
  Administered 2020-02-22: 10 mL

## 2020-02-21 NOTE — Progress Notes (Addendum)
      [address]       [place] [address]             [phone]      2 Days Post-Op Procedure(s) (LRB): VIDEO BRONCHOSCOPY (N/A) VIDEO ASSISTED THORACOSCOPY (Left) DRAINAGE OF PLEURAL EFFUSION (Left) DECORTICATION (Left) Intercostal Nerve Block (Left)   Subjective:  Up in chair, doing better today.  States only has pain if he is up moving around.  He has started to pass gas....  Objective: Vital signs in last 24 hours: Temp:  [97.3 F (36.3 C)-98.6 F (37 C)] 98.1 F (36.7 C) (03/31 0439) Pulse Rate:  [74-86] 86 (03/31 0439) Cardiac Rhythm: Normal sinus rhythm (03/31 0439) Resp:  [15-21] 20 (03/31 0439) BP: (118-142)/(65-76) 142/76 (03/31 0439) SpO2:  [94 %-97 %] 96 % (03/31 0439)  Intake/Output from previous day: 03/30 0701 - 03/31 0700 In: 0623 [P.O.:1180; I.V.:177] Out: 2745 [Urine:2275; Chest Tube:470]  General appearance: alert, cooperative and no distress Heart: regular rate and rhythm Lungs: diminished breath sounds left base Abdomen: soft, non-tender; bowel sounds normal; no masses,  no organomegaly Extremities: extremities normal, atraumatic, no cyanosis or edema Wound: clean and dry  Lab Results: Recent Labs    02/20/20 0732 02/21/20 0459  WBC 11.8* 10.3  HGB 10.6* 10.4*  HCT 34.6* 34.5*  PLT 188 186   BMET:  Recent Labs    02/20/20 0732 02/21/20 0459  NA 140 133*  K 5.0 4.6  CL 111 107  CO2 18* 16*  GLUCOSE 110* 88  BUN 56* 61*  CREATININE 5.03* 5.25*  CALCIUM 9.2 8.8*    PT/INR: No results for input(s): LABPROT, INR in the last 72 hours. ABG    Component Value Date/Time   PHART 7.327 (L) 02/19/2020 1843   HCO3 17.7 (L) 02/19/2020 1843   TCO2 22 02/19/2020 1403   ACIDBASEDEF 7.2 (H) 02/19/2020 1843   O2SAT 93.4 02/19/2020 1843   CBG (last 3)  Recent Labs    02/20/20 2012 02/21/20 0004 02/21/20 0617  GLUCAP 180* 93 110*    Assessment/Plan: S/P Procedure(s) (LRB): VIDEO BRONCHOSCOPY (N/A) VIDEO  ASSISTED THORACOSCOPY (Left) DRAINAGE OF PLEURAL EFFUSION (Left) DECORTICATION (Left) Intercostal Nerve Block (Left)  1. CV- NSR, BP controlled- continue Toprol, Norvasc, Hydralazine 2. Pulm- 650 cc output yesterday (level currently at 350), no air leak present, will place chest tube on water seal... CXR with improvement of left pleural effusions 3. ID- OR cultures negative to date, remains afebrile, no indication for ABX 4. Renal- Stage V CKD- creatinine remains at baseline range of 5.25 5. Expected post operative blood loss anemia, mild Hgb remains stable at 10.4 6. Dispo- patient stable, chest tubes to water seal, CT output at 650, will need to decrease prior to removing chest tubes, continue current care, repeat CXR in AM   LOS: 2 days    Ellwood Handler, PA-C  02/21/2020 Patient seen and examined, agree with above No air leak this AM, moderate serous drainage - CT to water seal Continue IS, ambulation  Braiden Rodman C. Roxan Hockey, MD [facility] [phone]

## 2020-02-21 NOTE — Plan of Care (Signed)
°  Problem: Education: °Goal: Knowledge of disease or condition will improve °Outcome: Progressing °Goal: Knowledge of the prescribed therapeutic regimen will improve °Outcome: Progressing °  °Problem: Activity: °Goal: Risk for activity intolerance will decrease °Outcome: Progressing °  °Problem: Cardiac: °Goal: Will achieve and/or maintain hemodynamic stability °Outcome: Progressing °  °Problem: Clinical Measurements: °Goal: Postoperative complications will be avoided or minimized °Outcome: Progressing °  °Problem: Respiratory: °Goal: Respiratory status will improve °Outcome: Progressing °  °Problem: Pain Management: °Goal: Pain level will decrease °Outcome: Progressing °  °Problem: Skin Integrity: °Goal: Wound healing without signs and symptoms infection will improve °Outcome: Progressing °  °

## 2020-02-22 ENCOUNTER — Inpatient Hospital Stay (HOSPITAL_COMMUNITY): Payer: Medicare HMO

## 2020-02-22 LAB — ACID FAST SMEAR (AFB, MYCOBACTERIA): Acid Fast Smear: NEGATIVE

## 2020-02-22 LAB — GLUCOSE, CAPILLARY: Glucose-Capillary: 92 mg/dL (ref 70–99)

## 2020-02-22 NOTE — Plan of Care (Signed)
°  Problem: Education: °Goal: Knowledge of disease or condition will improve °Outcome: Progressing °Goal: Knowledge of the prescribed therapeutic regimen will improve °Outcome: Progressing °  °Problem: Activity: °Goal: Risk for activity intolerance will decrease °Outcome: Progressing °  °Problem: Cardiac: °Goal: Will achieve and/or maintain hemodynamic stability °Outcome: Progressing °  °Problem: Clinical Measurements: °Goal: Postoperative complications will be avoided or minimized °Outcome: Progressing °  °Problem: Respiratory: °Goal: Respiratory status will improve °Outcome: Progressing °  °Problem: Pain Management: °Goal: Pain level will decrease °Outcome: Progressing °  °Problem: Skin Integrity: °Goal: Wound healing without signs and symptoms infection will improve °Outcome: Progressing °  °

## 2020-02-22 NOTE — Progress Notes (Addendum)
      [address]       [place] [address]             [phone]       3 Days Post-Op Procedure(s) (LRB): VIDEO BRONCHOSCOPY (N/A) VIDEO ASSISTED THORACOSCOPY (Left) DRAINAGE OF PLEURAL EFFUSION (Left) DECORTICATION (Left) Intercostal Nerve Block (Left)   Subjective:  Up in chair, feeling a little better.  Was able to move his bowels yesterday.  He would like his neck line out, however he is unwilling to allow a peripheral IV to be inserted.   Objective: Vital signs in last 24 hours: Temp:  [98 F (36.7 C)-98.6 F (37 C)] 98.6 F (37 C) (04/01 0346) Pulse Rate:  [83-94] 93 (04/01 0346) Cardiac Rhythm: Normal sinus rhythm (04/01 0346) Resp:  [15-18] 15 (04/01 0346) BP: (132-147)/(74-85) 143/81 (04/01 0346) SpO2:  [96 %-100 %] 100 % (04/01 0346)  Intake/Output from previous day: 03/31 0701 - 04/01 0700 In: 988.7 [P.O.:720; I.V.:268.7] Out: 3900 [Urine:3750; Chest Tube:150]  General appearance: alert, cooperative and no distress Heart: regular rate and rhythm Lungs: diminished breath sounds left base Abdomen: soft, non-tender; bowel sounds normal; no masses,  no organomegaly Extremities: extremities normal, atraumatic, no cyanosis or edema Wound: clean and dry, some ecchymosis present  Lab Results: Recent Labs    02/20/20 0732 02/21/20 0459  WBC 11.8* 10.3  HGB 10.6* 10.4*  HCT 34.6* 34.5*  PLT 188 186   BMET:  Recent Labs    02/20/20 0732 02/21/20 0459  NA 140 133*  K 5.0 4.6  CL 111 107  CO2 18* 16*  GLUCOSE 110* 88  BUN 56* 61*  CREATININE 5.03* 5.25*  CALCIUM 9.2 8.8*    PT/INR: No results for input(s): LABPROT, INR in the last 72 hours. ABG    Component Value Date/Time   PHART 7.327 (L) 02/19/2020 1843   HCO3 17.7 (L) 02/19/2020 1843   TCO2 22 02/19/2020 1403   ACIDBASEDEF 7.2 (H) 02/19/2020 1843   O2SAT 93.4 02/19/2020 1843   CBG (last 3)  Recent Labs    02/21/20 0617 02/21/20 2246 02/22/20 0622  GLUCAP 110*  135* 92    Assessment/Plan: S/P Procedure(s) (LRB): VIDEO BRONCHOSCOPY (N/A) VIDEO ASSISTED THORACOSCOPY (Left) DRAINAGE OF PLEURAL EFFUSION (Left) DECORTICATION (Left) Intercostal Nerve Block (Left)  1. CV- NSR, BP stable- continue Toprol, Norvasc, Hydralazine 2. Pulm- CT output was 250 cc output yesterday, can possibly d/c one chest tube today 3. ID- OR cultures negative to date, no need for ABX 4. Renal- CKD Stage V, stable 5. Lovenox for DVT prophylaxis 6. Dispo- patient stable, output is decreasing can possibly d/c one chest tube today, remains medically stable, repeat CXR in AM   LOS: 3 days    Ellwood Handler, PA-C 02/22/2020 Patient seen and examined, agree with above Dc posterior chest tube today  Remo Lipps C. Roxan Hockey, MD [facility] [phone]

## 2020-02-22 NOTE — Discharge Instructions (Signed)
Video-Assisted Thoracic Surgery, Care After This sheet gives you information about how to care for yourself after your procedure. Your health care provider may also give you more specific instructions. If you have problems or questions, contact your health care provider. What can I expect after the procedure? After the procedure, it is common to have:  Some pain and soreness in your chest.  Pain when breathing in (inhaling) and coughing.  Constipation.  Fatigue.  Difficulty sleeping. Follow these instructions at home: Preventing pneumonia  Take deep breaths or do breathing exercises as instructed by your health care provider. Doing this helps prevent lung infection (pneumonia).  Cough frequently. Coughing may cause discomfort, but it is important to clear mucus (phlegm) and expand your lungs. If it hurts to cough, hold a pillow against your chest or place the palms of both hands on top of the incision (use splinting) when you cough. This may help relieve discomfort.  If you were given an incentive spirometer, use it as directed. An incentive spirometer is a tool that measures how well you are filling your lungs with each breath.  Participate in pulmonary rehabilitation as directed by your health care provider. This is a program that combines education, exercise, and support from a team of specialists. The goal is to help you heal and get back to your normal activities as soon as possible. Medicines  Take over-the-counter or prescription medicines only as told by your health care provider.  If you have pain, take pain-relieving medicine before your pain becomes severe. This is important because if your pain is under control, you will be able to breathe and cough more comfortably.  If you were prescribed an antibiotic medicine, take it as told by your health care provider. Do not stop taking the antibiotic even if you start to feel better. Activity  Ask your health care provider what  activities are safe for you.  Avoid activities that use your chest muscles for at least 3-4 weeks.  Do not lift anything that is heavier than 10 lb (4.5 kg), or the limit that your health care provider tells you, until he or she says that it is safe. Incision care  Follow instructions from your health care provider about how to take care of your incision(s). Make sure you: ? Wash your hands with soap and water before you change your bandage (dressing). If soap and water are not available, use hand sanitizer. ? Change your dressing as told by your health care provider. ? Leave stitches (sutures), skin glue, or adhesive strips in place. These skin closures may need to stay in place for 2 weeks or longer. If adhesive strip edges start to loosen and curl up, you may trim the loose edges. Do not remove adhesive strips completely unless your health care provider tells you to do that.  Keep your dressing dry until it has been removed.  Check your incision area every day for signs of infection. Check for: ? Redness, swelling, or pain. ? Fluid or blood. ? Warmth. ? Pus or a bad smell. Bathing  Do not take baths, swim, or use a hot tub until your health care provider approves. You may take showers.  After your dressing has been removed, use soap and water to gently wash your incision area. Do not use anything else to clean your incision(s) unless your health care provider tells you to do this. Driving   Do not drive until your health care provider approves.  Do not drive or   use heavy machinery while taking prescription pain medicine. Eating and drinking  Eat a healthy, balanced diet as instructed by your health care provider. A healthy diet includes plenty of fresh fruits and vegetables, whole grains, and low-fat (lean) proteins.  Limit foods that are high in fat and processed sugars, such as fried and sweet foods.  Drink enough fluid to keep your urine clear or pale yellow. General  instructions   To prevent or treat constipation while you are taking prescription pain medicine, your health care provider may recommend that you: ? Take over-the-counter or prescription medicines. ? Eat foods that are high in fiber, such as beans, fresh fruits and vegetables, and whole grains.  Do not use any products that contain nicotine or tobacco, such as cigarettes and e-cigarettes. If you need help quitting, ask your health care provider.  Avoid secondhand smoke.  Wear compression stockings as told by your health care provider. These stockings help to prevent blood clots and reduce swelling in your legs.  If you have a chest tube, care for it as instructed by your health care provider. Do not travel by airplane during the 2 weeks after your chest tube is removed, or until your health care provider says that this is safe.  Keep all follow-up visits as told by your health care provider. This is important. Contact a health care provider if:  You have redness, swelling, or pain around an incision.  You have fluid or blood coming from an incision.  Your incision area feels warm to the touch.  You have pus or a bad smell coming from an incision.  You have a fever or chills.  You have nausea or vomiting.  You have pain that does not get better with medicine. Get help right away if:  You have chest pain.  Your heart is fluttering or beating rapidly.  You develop a rash.  You have shortness of breath or trouble breathing.  You are confused.  You have trouble speaking.  You feel weak, light-headed, or dizzy.  You faint. Summary  To help prevent lung infection (pneumonia), take deep breaths or do breathing exercises as instructed by your health care provider.  Cough frequently to clear mucus (phlegm) and expand your lungs. If it hurts to cough, hold a pillow against your chest or place the palms of both hands on top of the incision (use splinting) when you cough.  If  you have pain, take pain-relieving medicine before your pain becomes severe. This is important because if your pain is under control, you will be able to breathe and cough more comfortably.  Ask your health care provider what activities are safe for you. This information is not intended to replace advice given to you by your health care provider. Make sure you discuss any questions you have with your health care provider. Document Revised: 10/22/2017 Document Reviewed: 10/19/2016 Elsevier Patient Education  2020 [employer]  

## 2020-02-22 NOTE — Progress Notes (Signed)
Fentanyl PCA pump dc'd, wasted with RN

## 2020-02-23 ENCOUNTER — Inpatient Hospital Stay (HOSPITAL_COMMUNITY): Payer: Medicare HMO

## 2020-02-23 LAB — BPAM RBC
Blood Product Expiration Date: 202104102359
Blood Product Expiration Date: 202104132359
Blood Product Expiration Date: 202104262359
Blood Product Expiration Date: 202104262359
ISSUE DATE / TIME: 202103291048
ISSUE DATE / TIME: 202103291048
ISSUE DATE / TIME: 202103291441
Unit Type and Rh: 6200
Unit Type and Rh: 6200
Unit Type and Rh: 6200
Unit Type and Rh: 6200

## 2020-02-23 LAB — TYPE AND SCREEN
ABO/RH(D): A POS
Antibody Screen: NEGATIVE
Unit division: 0
Unit division: 0
Unit division: 0
Unit division: 0

## 2020-02-23 MED ORDER — DIPHENHYDRAMINE HCL 50 MG/ML IJ SOLN: 25.0000 mg | Freq: Four times a day (QID) | INTRAMUSCULAR | Status: DC | PRN

## 2020-02-23 MED ORDER — DIPHENHYDRAMINE-ZINC ACETATE 2-0.1 % EX CREA
TOPICAL_CREAM | Freq: Three times a day (TID) | CUTANEOUS | Status: DC | PRN
  Administered 2020-02-23: 1 via TOPICAL
  Filled 2020-02-23: qty 28

## 2020-02-23 NOTE — Plan of Care (Signed)
  Problem: Pain Management: Goal: Pain level will decrease Outcome: Progressing   

## 2020-02-23 NOTE — Plan of Care (Signed)
  Problem: Education: Goal: Knowledge of disease or condition will improve Outcome: Completed/Met Goal: Knowledge of the prescribed therapeutic regimen will improve Outcome: Progressing   Problem: Activity: Goal: Risk for activity intolerance will decrease Outcome: Progressing   Problem: Cardiac: Goal: Will achieve and/or maintain hemodynamic stability Outcome: Progressing   Problem: Clinical Measurements: Goal: Postoperative complications will be avoided or minimized Outcome: Progressing   Problem: Respiratory: Goal: Respiratory status will improve Outcome: Progressing   Problem: Pain Management: Goal: Pain level will decrease Outcome: Progressing   Problem: Skin Integrity: Goal: Wound healing without signs and symptoms infection will improve Outcome: Progressing   Problem: Education: Goal: Knowledge of disease or condition will improve Outcome: Completed/Met Goal: Knowledge of the prescribed therapeutic regimen will improve Outcome: Completed/Met   Problem: Activity: Goal: Risk for activity intolerance will decrease Outcome: Progressing   Problem: Cardiac: Goal: Will achieve and/or maintain hemodynamic stability Outcome: Progressing   Problem: Clinical Measurements: Goal: Postoperative complications will be avoided or minimized Outcome: Progressing   Problem: Respiratory: Goal: Respiratory status will improve Outcome: Progressing   Problem: Pain Management: Goal: Pain level will decrease Outcome: Progressing   Problem: Skin Integrity: Goal: Wound healing without signs and symptoms infection will improve Outcome: Progressing

## 2020-02-23 NOTE — Progress Notes (Addendum)
      [address]Suite [address]       [place] [address]             [phone]      4 Days Post-Op Procedure(s) (LRB): VIDEO BRONCHOSCOPY (N/A) VIDEO ASSISTED THORACOSCOPY (Left) DRAINAGE OF PLEURAL EFFUSION (Left) DECORTICATION (Left) Intercostal Nerve Block (Left)   Subjective:  Patient doing well. Continues to be sore along left side.  He also has itching and a "rash" along his back.  Objective: Vital signs in last 24 hours: Temp:  [98 F (36.7 C)-98.2 F (36.8 C)] 98.2 F (36.8 C) (04/02 0317) Pulse Rate:  [82-91] 88 (04/02 0317) Cardiac Rhythm: Normal sinus rhythm (04/02 0400) Resp:  [16-18] 16 (04/02 0317) BP: (127-152)/(68-81) 142/76 (04/02 0317) SpO2:  [98 %-100 %] 99 % (04/02 0317)  Intake/Output from previous day: 04/01 0701 - 04/02 0700 In: 790 [P.O.:740; I.V.:50] Out: 1675 [Urine:1475; Chest Tube:200]  General appearance: alert, cooperative and no distress Heart: regular rate and rhythm Lungs: diminished breath sounds left base Abdomen: soft, non-tender; bowel sounds normal; no masses,  no organomegaly Extremities: minimal rash along back Wound: clean and dry  Lab Results: Recent Labs    02/20/20 0732 02/21/20 0459  WBC 11.8* 10.3  HGB 10.6* 10.4*  HCT 34.6* 34.5*  PLT 188 186   BMET:  Recent Labs    02/20/20 0732 02/21/20 0459  NA 140 133*  K 5.0 4.6  CL 111 107  CO2 18* 16*  GLUCOSE 110* 88  BUN 56* 61*  CREATININE 5.03* 5.25*  CALCIUM 9.2 8.8*    PT/INR: No results for input(s): LABPROT, INR in the last 72 hours. ABG    Component Value Date/Time   PHART 7.327 (L) 02/19/2020 1843   HCO3 17.7 (L) 02/19/2020 1843   TCO2 22 02/19/2020 1403   ACIDBASEDEF 7.2 (H) 02/19/2020 1843   O2SAT 93.4 02/19/2020 1843   CBG (last 3)  Recent Labs    02/21/20 0617 02/21/20 2246 02/22/20 0622  GLUCAP 110* 135* 92    Assessment/Plan: S/P Procedure(s) (LRB): VIDEO BRONCHOSCOPY (N/A) VIDEO ASSISTED THORACOSCOPY (Left) DRAINAGE OF  PLEURAL EFFUSION (Left) DECORTICATION (Left) Intercostal Nerve Block (Left)  1. CV- NSR, BP stable continue home Toprol, Norvasc, Hydralazine 2. Pulm- 110 cc output from chest tube yesterday (level at 810), no air leak, will leave chest tube in one more day if output remains low can d/c tomorrow 3. ID-OR cultures remain negative to date, pathology shows inflammation 4. CKD Stage V- creatinine had been stable, will repeat BMET in AM 5. Contact Dermatitis- mild, patient is sweating notices itching, rash mainly when in bed, will add prn Benadryl 6. Dispo- patient stable, CT output is decreasing, 110 cc output yesterday, CXR remains stable after removal of posterior chest tube, will leave final chest tube in 1 more day can hopefully remove tomorrow if output remains low, repeat BMET in AM... patient likely d/c over the weekend   LOS: 4 days    Ellwood Handler, PA-C  02/23/2020 Patient seen and examined, agree with above Will go ahead and dc CT this AM, only 50 ml last 12 hours Will add flutter valve as he still has a lot of left lower lobe atelectasis Possibly home in AM  [place] C. Roxan Hockey, MD [facility] [phone]

## 2020-02-23 NOTE — Plan of Care (Signed)
  Problem: Education: Goal: Knowledge of disease or condition will improve 02/23/2020 1509 by Ander Slade, RN Outcome: Progressing 02/23/2020 1505 by Ander Slade, RN Outcome: Progressing Goal: Knowledge of the prescribed therapeutic regimen will improve 02/23/2020 1509 by Ander Slade, RN Outcome: Progressing 02/23/2020 1505 by Ander Slade, RN Outcome: Progressing   Problem: Activity: Goal: Risk for activity intolerance will decrease 02/23/2020 1509 by Ander Slade, RN Outcome: Progressing 02/23/2020 1505 by Ander Slade, RN Outcome: Progressing   Problem: Cardiac: Goal: Will achieve and/or maintain hemodynamic stability 02/23/2020 1509 by Ander Slade, RN Outcome: Progressing 02/23/2020 1505 by Ander Slade, RN Outcome: Progressing   Problem: Clinical Measurements: Goal: Postoperative complications will be avoided or minimized 02/23/2020 1509 by Ander Slade, RN Outcome: Progressing 02/23/2020 1505 by Ander Slade, RN Outcome: Progressing   Problem: Respiratory: Goal: Respiratory status will improve 02/23/2020 1509 by Ander Slade, RN Outcome: Progressing 02/23/2020 1505 by Ander Slade, RN Outcome: Progressing   Problem: Pain Management: Goal: Pain level will decrease 02/23/2020 1509 by Ander Slade, RN Outcome: Progressing 02/23/2020 1505 by Ander Slade, RN Outcome: Progressing   Problem: Skin Integrity: Goal: Wound healing without signs and symptoms infection will improve 02/23/2020 1509 by Ander Slade, RN Outcome: Progressing 02/23/2020 1505 by Ander Slade, RN Outcome: Progressing

## 2020-02-23 NOTE — Progress Notes (Signed)
Chest tube remvd 7867, no complications---jill/xray advised to do xray at 1415 today

## 2020-02-23 NOTE — Plan of Care (Signed)
Left Posterior chest tube remvd 4/1, tolerated well.  Left anterior chest tube remvd 4/2, tolerated well.  Patient continues to use incentive spirometry, has added flutter valve spirometry with teach back for understanding, patient reports minimal pain, mostly soreness

## 2020-02-23 NOTE — Plan of Care (Signed)
  Problem: Education: Goal: Knowledge of disease or condition will improve 02/23/2020 1511 by Ander Slade, RN Outcome: Progressing 02/23/2020 1509 by Ander Slade, RN Outcome: Progressing 02/23/2020 1505 by Ander Slade, RN Outcome: Progressing Goal: Knowledge of the prescribed therapeutic regimen will improve 02/23/2020 1511 by Ander Slade, RN Outcome: Progressing 02/23/2020 1509 by Ander Slade, RN Outcome: Progressing 02/23/2020 1505 by Ander Slade, RN Outcome: Progressing   Problem: Activity: Goal: Risk for activity intolerance will decrease 02/23/2020 1511 by Ander Slade, RN Outcome: Progressing 02/23/2020 1509 by Ander Slade, RN Outcome: Progressing 02/23/2020 1505 by Ander Slade, RN Outcome: Progressing   Problem: Cardiac: Goal: Will achieve and/or maintain hemodynamic stability 02/23/2020 1511 by Ander Slade, RN Outcome: Progressing 02/23/2020 1509 by Ander Slade, RN Outcome: Progressing 02/23/2020 1505 by Ander Slade, RN Outcome: Progressing   Problem: Clinical Measurements: Goal: Postoperative complications will be avoided or minimized 02/23/2020 1511 by Ander Slade, RN Outcome: Progressing 02/23/2020 1509 by Ander Slade, RN Outcome: Progressing 02/23/2020 1505 by Ander Slade, RN Outcome: Progressing   Problem: Respiratory: Goal: Respiratory status will improve 02/23/2020 1511 by Ander Slade, RN Outcome: Progressing 02/23/2020 1509 by Ander Slade, RN Outcome: Progressing 02/23/2020 1505 by Ander Slade, RN Outcome: Progressing   Problem: Pain Management: Goal: Pain level will decrease 02/23/2020 1511 by Ander Slade, RN Outcome: Progressing 02/23/2020 1509 by Ander Slade, RN Outcome: Progressing 02/23/2020 1505 by Ander Slade, RN Outcome: Progressing   Problem: Skin Integrity: Goal: Wound healing without signs and symptoms infection will  improve 02/23/2020 1511 by Ander Slade, RN Outcome: Progressing 02/23/2020 1509 by Ander Slade, RN Outcome: Progressing 02/23/2020 1505 by Ander Slade, RN Outcome: Progressing   Problem: Education: Goal: Knowledge of disease or condition will improve Outcome: Progressing Goal: Knowledge of the prescribed therapeutic regimen will improve Outcome: Progressing   Problem: Activity: Goal: Risk for activity intolerance will decrease Outcome: Progressing   Problem: Cardiac: Goal: Will achieve and/or maintain hemodynamic stability Outcome: Progressing   Problem: Clinical Measurements: Goal: Postoperative complications will be avoided or minimized Outcome: Progressing   Problem: Respiratory: Goal: Respiratory status will improve Outcome: Progressing   Problem: Pain Management: Goal: Pain level will decrease Outcome: Progressing   Problem: Skin Integrity: Goal: Wound healing without signs and symptoms infection will improve Outcome: Progressing

## 2020-02-24 ENCOUNTER — Inpatient Hospital Stay (HOSPITAL_COMMUNITY): Payer: Medicare HMO

## 2020-02-24 LAB — BASIC METABOLIC PANEL
Anion gap: 10 (ref 5–15)
BUN: 63 mg/dL — ABNORMAL HIGH (ref 8–23)
CO2: 19 mmol/L — ABNORMAL LOW (ref 22–32)
Calcium: 9.4 mg/dL (ref 8.9–10.3)
Chloride: 108 mmol/L (ref 98–111)
Creatinine, Ser: 5 mg/dL — ABNORMAL HIGH (ref 0.61–1.24)
GFR calc Af Amer: 13 mL/min — ABNORMAL LOW (ref >60)
GFR calc non Af Amer: 11 mL/min — ABNORMAL LOW (ref >60)
Glucose, Bld: 107 mg/dL — ABNORMAL HIGH (ref 70–99)
Potassium: 5.1 mmol/L (ref 3.5–5.1)
Sodium: 137 mmol/L (ref 135–145)

## 2020-02-24 LAB — AEROBIC/ANAEROBIC CULTURE W GRAM STAIN (SURGICAL/DEEP WOUND)
Culture: NO GROWTH
Culture: NO GROWTH
Gram Stain: NONE SEEN

## 2020-02-24 MED ORDER — TRAMADOL HCL 50 MG PO TABS: 50.0000 mg | ORAL_TABLET | Freq: Four times a day (QID) | ORAL | 0 refills | Status: AC | PRN

## 2020-02-24 NOTE — Progress Notes (Signed)
Social Visit Patient feeling well, happy to be going home. Discussed pulm clinic followup and he will call our office if he is still having DOE. Wished him best of luck.  Erskine Emery MD Pulmonary

## 2020-02-24 NOTE — Progress Notes (Addendum)
[address]       [place] [address]             [phone]      5 Days Post-Op Procedure(s) (LRB): VIDEO BRONCHOSCOPY (N/A) VIDEO ASSISTED THORACOSCOPY (Left) DRAINAGE OF PLEURAL EFFUSION (Left) DECORTICATION (Left) Intercostal Nerve Block (Left) Subjective: Mild soreness, breathing is comfortable at rest  Objective: Vital signs in last 24 hours: Temp:  [97.7 F (36.5 C)-98.2 F (36.8 C)] 97.9 F (36.6 C) (04/03 0743) Pulse Rate:  [81-92] 92 (04/03 0743) Cardiac Rhythm: Normal sinus rhythm (04/03 0347) Resp:  [11-21] 21 (04/03 0743) BP: (123-142)/(62-87) 142/79 (04/03 0743) SpO2:  [99 %-100 %] 100 % (04/03 0743)  Hemodynamic parameters for last 24 hours:    Intake/Output from previous day: 04/02 0701 - 04/03 0700 In: 720 [P.O.:720] Out: 20 [Chest Tube:20] Intake/Output this shift: No intake/output data recorded.  General appearance: alert, cooperative and no distress Heart: regular rate and rhythm Lungs: dim left lower fields Abdomen: benign Extremities: no calf tenderness Wound: incis healing well  Lab Results: No results for input(s): WBC, HGB, HCT, PLT in the last 72 hours. BMET:  Recent Labs    02/24/20 0404  NA 137  K 5.1  CL 108  CO2 19*  GLUCOSE 107*  BUN 63*  CREATININE 5.00*  CALCIUM 9.4    PT/INR: No results for input(s): LABPROT, INR in the last 72 hours. ABG    Component Value Date/Time   PHART 7.327 (L) 02/19/2020 1843   HCO3 17.7 (L) 02/19/2020 1843   TCO2 22 02/19/2020 1403   ACIDBASEDEF 7.2 (H) 02/19/2020 1843   O2SAT 93.4 02/19/2020 1843   CBG (last 3)  Recent Labs    02/21/20 2246 02/22/20 0622  GLUCAP 135* 92    Meds Scheduled Meds: . sodium chloride   Intravenous Once  . acetaminophen  1,000 mg Oral Q6H   Or  . acetaminophen (TYLENOL) oral liquid 160 mg/5 mL  1,000 mg Oral Q6H  . amLODipine  5 mg Oral Daily  . ascorbic acid  500 mg Oral Daily  . aspirin EC  81 mg Oral Daily  .  atorvastatin  40 mg Oral q1800  . bisacodyl  10 mg Oral Daily  . calcitRIOL  0.5 mcg Oral Daily  . Chlorhexidine Gluconate Cloth  6 each Topical Daily  . enoxaparin (LOVENOX) injection  30 mg Subcutaneous Daily  . ferrous sulfate  325 mg Oral BID WC  . gabapentin  200 mg Oral QHS  . glipiZIDE  2.5 mg Oral BID WC  . hydrALAZINE  10 mg Oral TID  . ketorolac  1 drop Right Eye QID  . metoprolol succinate  50 mg Oral Daily  . rOPINIRole  0.25 mg Oral QHS  . senna-docusate  1 tablet Oral QHS  . sevelamer carbonate  800 mg Oral TID WC  . sodium chloride flush  10-40 mL Intracatheter Q12H  . tamsulosin  0.4 mg Oral QAC supper   Continuous Infusions: . sodium chloride 10 mL/hr at 02/22/20 1200   PRN Meds:.albuterol, diphenhydrAMINE, diphenhydrAMINE-zinc acetate, fluticasone, ondansetron (ZOFRAN) IV, oxyCODONE, sodium chloride flush, traMADol, zolpidem  Xrays DG Chest Port 1 View  Result Date: 02/23/2020 CLINICAL DATA:  69 year old male with a history pleural effusion EXAM: PORTABLE CHEST 1 VIEW COMPARISON:  02/23/2020 FINDINGS: Cardiomediastinal silhouette unchanged in size and contour. Surgical changes of median sternotomy and CABG. Unchanged right IJ central venous catheter. Pleuroparenchymal thickening at the lateral aspect of  the left lung with blunting of left costophrenic angle. Interval removal of the remaining left chest tube. No pneumothorax. Coarsened interstitial markings. IMPRESSION: Interval removal of the left thoracostomy tube with no visualized pneumothorax. Small residual pleural thickening/fluid. Unchanged right IJ central venous catheter. Electronically Signed   By: Corrie Mckusick D.O.   On: 02/23/2020 14:31   DG CHEST PORT 1 VIEW  Result Date: 02/23/2020 CLINICAL DATA:  Pneumothorax. EXAM: PORTABLE CHEST 1 VIEW COMPARISON:  Chest radiograph 02/22/2020 FINDINGS: Interval removal of one of the two previously demonstrated left-sided chest tubes. Unchanged position of right IJ  approach central venous catheter. Prior median sternotomy. Partially visualized ACDF hardware. Stable cardiomegaly. Aortic atherosclerosis. Unchanged appearance of left pleural thickening/pleural fluid. Stable volume loss in the left hemithorax with vascular crowding and atelectasis. The right lung remains clear. A trace left apical pneumothorax is questioned. IMPRESSION: Interval removal of one of two previously demonstrated left-sided chest tubes. A trace left apical pneumothorax is questioned. Unchanged appearance of left pleural thickening/pleural fluid. Stable volume loss in the left hemithorax with vascular crowding and atelectasis. Electronically Signed   By: Kellie Simmering DO   On: 02/23/2020 07:27   Results for orders placed or performed during the hospital encounter of 02/19/20  Aerobic/Anaerobic Culture (surgical/deep wound)     Status: None (Preliminary result)   Collection Time: 02/19/20 11:54 AM   Specimen: Pleural, Left; Body Fluid  Result Value Ref Range Status   Specimen Description PLEURAL LEFT FLUID  Final   Special Requests SEPC A  Final   Gram Stain   Final    MODERATE WBC PRESENT,BOTH PMN AND MONONUCLEAR NO ORGANISMS SEEN    Culture   Final    NO GROWTH 4 DAYS Performed at [facility], [address] [place], [place] [address]    Report Status PENDING  Incomplete  Acid Fast Smear (AFB)     Status: None   Collection Time: 02/19/20 11:54 AM   Specimen: Pleural, Left; Body Fluid  Result Value Ref Range Status   AFB Specimen Processing Concentration  Final   Acid Fast Smear Negative  Final    Comment: (NOTE) Performed At: [facility] [place], [place] [id number] Rush Farmer MD [id number]    Source (AFB) PLEURAL  Final    Comment: LEFT Performed at [facility], [place] [address], [place], [place] [address]   Fungus Culture With Stain     Status: None (Preliminary result)   Collection Time: 02/19/20 11:54 AM  Result Value Ref  Range Status   Fungus Stain Final report  Final    Comment: (NOTE) Performed At: [facility] [place], [place] [id number] Rush Farmer MD [id number]    Fungus (Mycology) Culture PENDING  Incomplete   Fungal Source PLEURAL  Final    Comment: LEFT Performed at [facility], [place] [address], [place], [place] [address]   Fungus Culture Result     Status: None   Collection Time: 02/19/20 11:54 AM  Result Value Ref Range Status   Result 1 Comment  Final    Comment: (NOTE) KOH/Calcofluor preparation:  no fungus observed. Performed At: [facility] [place], [place] [id number] Rush Farmer MD FG:[id number]   Aerobic/Anaerobic Culture (surgical/deep wound)     Status: None (Preliminary result)   Collection Time: 02/19/20 12:03 PM   Specimen: Pleural, Left; Lung  Result Value Ref Range Status   Specimen Description TISSUE LEFT LUNG  Final  Special Requests SPEC B  Final   Gram Stain NO WBC SEEN NO ORGANISMS SEEN   Final   Culture   Final    NO GROWTH 4 DAYS Performed at [facility], [address], [place], [place] [address]    Report Status PENDING  Incomplete  Acid Fast Smear (AFB)     Status: None   Collection Time: 02/19/20 12:03 PM   Specimen: Pleural, Left; Lung  Result Value Ref Range Status   AFB Specimen Processing Comment  Final    Comment: Tissue Grinding and Digestion/Decontamination   Acid Fast Smear Negative  Final    Comment: (NOTE) Performed At: [facility] [place], [place] [id number] Rush Farmer MD [id number]    Source (AFB) TISSUE  Final    Comment: LEFT LUNG SPEC B Performed at [facility], [place] [address], [place], [place] [address]   Fungus Culture With Stain     Status: None (Preliminary result)   Collection Time: 02/19/20 12:03 PM  Result Value Ref Range Status   Fungus Stain Final report  Final    Comment: (NOTE) Performed At: [facility] [place], [place] [id number] Rush Farmer MD [id number]    Fungus (Mycology) Culture PENDING  Incomplete   Fungal Source TISSUE  Final    Comment: LEFT LUNG SPEC B Performed at [facility], [place] [address] [place], [place] [address]   Fungus Culture Result     Status: None   Collection Time: 02/19/20 12:03 PM  Result Value Ref Range Status   Result 1 Comment  Final    Comment: (NOTE) KOH/Calcofluor preparation:  no fungus observed. Performed At: [facility] [place], [place] [id number] Rush Farmer MD DZ:[id number]     Assessment/Plan: S/P Procedure(s) (LRB): VIDEO BRONCHOSCOPY (N/A) VIDEO ASSISTED THORACOSCOPY (Left) DRAINAGE OF PLEURAL EFFUSION (Left) DECORTICATION (Left) Intercostal Nerve Block (Left)  1 conts to progress  2 hemodyn stable in sinus rhythm 3 sats excellent on RA 4 CXR stable in appearance 5 no new findings on cx 6 stable for d/c   LOS: 5 days    John Giovanni PA-C pager [phone] 02/24/2020 Patient seen and examined, agree with above Dc home today  Remo Lipps C. Roxan Hockey, MD [facility] [phone]

## 2020-02-26 ENCOUNTER — Telehealth: Payer: Self-pay

## 2020-02-26 NOTE — Telephone Encounter (Signed)
-----   Message from Candee Furbish, MD sent at 02/24/2020 10:34 AM EDT ----- Regarding: dc appt Dc this guy's appt with me and he will be PRN f/u

## 2020-02-26 NOTE — Telephone Encounter (Signed)
Spoke with Dr. Tamala Julian who advised that since the patient is established with Dr. Roxan Hockey, he does not need to follow up tomorrow.   Called and spoke with patient. He agreed to cancel his appointment. I advised him that since he will probably be having surgery, it would be be best to keep his PFT appointment. He verbalized understanding.   Nothing further needed at time of call.

## 2020-02-27 ENCOUNTER — Encounter: Payer: Self-pay | Admitting: Cardiology

## 2020-02-27 ENCOUNTER — Ambulatory Visit: Payer: Medicare HMO | Admitting: Cardiology

## 2020-02-27 ENCOUNTER — Ambulatory Visit: Payer: Medicare HMO | Admitting: Internal Medicine

## 2020-02-27 ENCOUNTER — Other Ambulatory Visit: Payer: Self-pay

## 2020-02-27 VITALS — BP 148/62 | HR 88 | Ht 73.0 in | Wt 202.8 lb

## 2020-02-27 DIAGNOSIS — Z09 Encounter for follow-up examination after completed treatment for conditions other than malignant neoplasm: Secondary | ICD-10-CM

## 2020-02-27 DIAGNOSIS — I255 Ischemic cardiomyopathy: Secondary | ICD-10-CM

## 2020-02-27 DIAGNOSIS — N185 Chronic kidney disease, stage 5: Secondary | ICD-10-CM | POA: Diagnosis not present

## 2020-02-27 DIAGNOSIS — Z951 Presence of aortocoronary bypass graft: Secondary | ICD-10-CM | POA: Diagnosis not present

## 2020-02-27 DIAGNOSIS — I10 Essential (primary) hypertension: Secondary | ICD-10-CM

## 2020-02-27 MED ORDER — HYDRALAZINE HCL 25 MG PO TABS: 25.0000 mg | ORAL_TABLET | Freq: Three times a day (TID) | ORAL | 3 refills | Status: DC

## 2020-02-27 NOTE — Progress Notes (Signed)
Cardiology Office Note:    Date:  02/27/2020   ID:  John [name], DOB 10-Dec-1950, MRN [id number]  PCP:  Imagene Riches, NP  Cardiologist:  Jenne Campus, MD    Referring MD: Imagene Riches, NP   No chief complaint on file. Doing better  History of Present Illness:    John [name] is a 69 y.o. male  The patient has a history of coronary artery disease with prior PCI to the RCA, type 2 diabetes, hypertension, PAD status post aortofemoral bypass, end-stage IVchronic kidney disease.He was found to have elevated troponin, and therefore cardiac catheterization was completed on 03/27/2019.  Cardiac cath revealed severe multivessel disease to include 90% mid RCA lesion, ostial circumflex to proximal circumflex lesion 80% stenosed, proximal LAD to mid LAD 75% stenosed with markedly positive DFR 0.71, first diagonal was 70% stenosed, proximal RCA to mid RCA was 35% stenosed, distal RCA 1 lesion 40% stenosis was prior stent revealing mild in-stent restenosis, distal RCA to lesion 80% stenosed. The patient was referred to CVTS for surgical consultation for CABG.  The patient underwent a four-vessel CABG on 03/29/2019 with LIMA to LAD, SVG to diagonal, SVG to OM, and SVG to PDA. He had endoscopic harvesting of the greater saphenous vein from his right leg. Postoperatively he had thrombocytopenia on Lovenox and this was discontinued, he was also found to have postoperative CHF and was diuresed aggressively. He was followed by nephrology throughout hospital course. He was discharged on 04/03/2019  As a part of evaluation he had a chest x-ray done which showed some loculated large fluid in the left pleura, also echocardiogram being done under some suspicion for endocarditis of the aortic valve. TEE done today did not confirm presence of active endocarditis.  He did have some calcification of the leaflet of the aortic valve. He was also find to have chronic pleural effusion on the 02/19/2020  he did have video bronchoscopy done as well as left VATS with drainage of empyema as well as decortication and intercostal nerve block.  He tolerated procedure quite well and he was discharged home last Saturday.  Today is Tuesday.  Since that time he is doing well.  He said breathing is much better he does not get as much short of breath as before.  Denies have any fever or chills.  Overall he is very happy and satisfied with the results of his leg surgery.  Past Medical History:  Diagnosis Date  . Arthritis   . Carotid artery stenosis    03-47% LICA, 4-25% RICA 07/28/62 [place]  . Coronary artery disease   . Diabetes mellitus without complication ([place])   . Dyspnea   . Hypertension   . MI (myocardial infarction) ([place])   . Renal disorder   . Thoracic aortic aneurysm (TAA) ([facility])    4.3 cm ascending TAA 02/02/20 CT    Past Surgical History:  Procedure Laterality Date  . ABDOMINAL AORTIC ANEURYSM REPAIR    . APPENDECTOMY    . AV FISTULA PLACEMENT Right 10/18/2018   Procedure: BRACHIO-CEPHALIC ARTERIOVENOUS (AV) FISTULA CREATION;  Surgeon: Angelia Mould, MD;  Location: [facility];  Service: Vascular;  Laterality: Right;  . CARDIAC CATHETERIZATION    . CORONARY ANGIOPLASTY    . CORONARY ARTERY BYPASS GRAFT N/A 03/29/2019   Procedure: CORONARY ARTERY BYPASS GRAFTING (CABG) x 4, using left internal mammary artery and right leg greater saphenous vein harvested endoscopically;  Surgeon: Melrose Nakayama, MD;  Location: [facility];  Service:  Open Heart Surgery;  Laterality: N/A;  . CORONARY PRESSURE WIRE/FFR WITH 3D MAPPING N/A 03/27/2019   Procedure: Coronary Pressure Wire/FFR w/3D Mapping;  Surgeon: Jettie Booze, MD;  Location: [facility];  Service: Cardiovascular;  Laterality: N/A;  . DECORTICATION Left 02/19/2020   Procedure: DECORTICATION;  Surgeon: Melrose Nakayama, MD;  Location: [facility];  Service: Thoracic;  Laterality: Left;  . INTERCOSTAL NERVE BLOCK Left 02/19/2020    Procedure: Intercostal Nerve Block;  Surgeon: Melrose Nakayama, MD;  Location: [facility];  Service: Thoracic;  Laterality: Left;  . IR FLUORO GUIDE CV LINE RIGHT  09/13/2017  . IR REMOVAL TUN CV CATH W/O FL  10/22/2017  . IR US GUIDE VASC ACCESS RIGHT  09/13/2017  . LEFT HEART CATH AND CORONARY ANGIOGRAPHY N/A 03/27/2019   Procedure: LEFT HEART CATH AND CORONARY ANGIOGRAPHY;  Surgeon: Jettie Booze, MD;  Location: [facility];  Service: Cardiovascular;  Laterality: N/A;  . NECK SURGERY    . PLEURAL EFFUSION DRAINAGE Left 02/19/2020   Procedure: DRAINAGE OF PLEURAL EFFUSION;  Surgeon: Melrose Nakayama, MD;  Location: [facility];  Service: Thoracic;  Laterality: Left;  . SMALL INTESTINE SURGERY    . TEE WITHOUT CARDIOVERSION N/A 09/10/2017   Procedure: TRANSESOPHAGEAL ECHOCARDIOGRAM (TEE);  Surgeon: Skeet Latch, MD;  Location: [facility];  Service: Cardiovascular;  Laterality: N/A;  . TEE WITHOUT CARDIOVERSION N/A 03/29/2019   Procedure: TRANSESOPHAGEAL ECHOCARDIOGRAM (TEE);  Surgeon: Melrose Nakayama, MD;  Location: [facility];  Service: Open Heart Surgery;  Laterality: N/A;  . TEE WITHOUT CARDIOVERSION N/A 01/12/2020   Procedure: TRANSESOPHAGEAL ECHOCARDIOGRAM (TEE);  Surgeon: Sanda Klein, MD;  Location: [facility];  Service: Cardiovascular;  Laterality: N/A;  . VIDEO ASSISTED THORACOSCOPY Left 02/19/2020   Procedure: VIDEO ASSISTED THORACOSCOPY;  Surgeon: Melrose Nakayama, MD;  Location: [facility];  Service: Thoracic;  Laterality: Left;  Marland Kitchen VIDEO BRONCHOSCOPY N/A 02/19/2020   Procedure: VIDEO BRONCHOSCOPY;  Surgeon: Melrose Nakayama, MD;  Location: [facility];  Service: Thoracic;  Laterality: N/A;    Current Medications: Current Meds  Medication Sig  . acetaminophen (TYLENOL) 500 MG tablet Take 1,500 mg by mouth 2 (two) times daily as needed (pain).  Marland Kitchen albuterol (VENTOLIN HFA) 108 (90 Base) MCG/ACT inhaler Inhale 2 puffs into the lungs every 6 (six) hours as needed for  wheezing or shortness of breath.  Marland Kitchen amLODipine (NORVASC) 5 MG tablet Take 5 mg by mouth daily.  Marland Kitchen ascorbic acid (VITAMIN C) 500 MG tablet Take 500 mg by mouth daily.  Marland Kitchen aspirin EC 81 MG tablet Take 81 mg by mouth daily.  Marland Kitchen atorvastatin (LIPITOR) 40 MG tablet Take 40 mg by mouth daily in the afternoon.   . calcitRIOL (ROCALTROL) 0.5 MCG capsule Take 0.5 mcg by mouth daily.  . Dulaglutide (TRULICITY) 1.5 IO/9.6EX SOPN Inject 1.5 mg into the skin every Sunday.  . ferrous sulfate 325 (65 FE) MG tablet Take 325 mg by mouth 2 (two) times daily with a meal.  . fluticasone (FLONASE) 50 MCG/ACT nasal spray Place 1 spray into both nostrils daily as needed for allergies or rhinitis.  Marland Kitchen gabapentin (NEURONTIN) 100 MG capsule Take 2 capsules (200 mg total) by mouth at bedtime.  Marland Kitchen glipiZIDE (GLUCOTROL XL) 2.5 MG 24 hr tablet Take 2.5 mg by mouth 2 (two) times daily.  . hydrALAZINE (APRESOLINE) 10 MG tablet TAKE 1 TABLET BY MOUTH THREE TIMES A DAY (Patient taking differently: Take 10 mg by mouth 3 (three) times  daily. )  . linagliptin (TRADJENTA) 5 MG TABS tablet Take 5 mg by mouth daily.  . metoprolol succinate (TOPROL-XL) 50 MG 24 hr tablet Take 50 mg by mouth daily.  Marland Kitchen OVER THE COUNTER MEDICATION Place 1 drop into both eyes 2 (two) times daily as needed (dry eyes). Hyper Tears eye drops  . rOPINIRole (REQUIP) 0.25 MG tablet Take 0.25 mg by mouth at bedtime.  . sevelamer carbonate (RENVELA) 800 MG tablet Take 1 tablet (800 mg total) by mouth 3 (three) times daily with meals.  . tamsulosin (FLOMAX) 0.4 MG CAPS capsule Take 0.4 mg by mouth daily before supper.  . traMADol (ULTRAM) 50 MG tablet Take 1 tablet (50 mg total) by mouth every 6 (six) hours as needed for up to 7 days (mild pain).  Marland Kitchen zolpidem (AMBIEN) 10 MG tablet Take 10 mg by mouth at bedtime.     Allergies:   Penicillins   Social History   Socioeconomic History  . Marital status: Married    Spouse name: Not on file  . Number of children: Not  on file  . Years of education: Not on file  . Highest education level: Not on file  Occupational History  . Not on file  Tobacco Use  . Smoking status: Former Smoker    Packs/day: 2.00    Years: 30.00    Pack years: 60.00    Types: Cigarettes    Start date: 1968    Quit date: 1998    Years since quitting: 23.2  . Smokeless tobacco: Never Used  Substance and Sexual Activity  . Alcohol use: Yes    Comment: Occasionally.  . Drug use: Never  . Sexual activity: Not on file  Other Topics Concern  . Not on file  Social History Narrative  . Not on file   Social Determinants of Health   Financial Resource Strain:   . Difficulty of Paying Living Expenses:   Food Insecurity:   . Worried About Charity fundraiser in the Last Year:   . Arboriculturist in the Last Year:   Transportation Needs:   . Film/video editor (Medical):   Marland Kitchen Lack of Transportation (Non-Medical):   Physical Activity:   . Days of Exercise per Week:   . Minutes of Exercise per Session:   Stress:   . Feeling of Stress :   Social Connections:   . Frequency of Communication with Friends and Family:   . Frequency of Social Gatherings with Friends and Family:   . Attends Religious Services:   . Active Member of Clubs or Organizations:   . Attends Archivist Meetings:   Marland Kitchen Marital Status:      Family History: The patient's family history includes Cancer in his maternal grandmother. ROS:   Please see the history of present illness.    All 14 point review of systems negative except as described per history of present illness  EKGs/Labs/Other Studies Reviewed:      Recent Labs: 03/30/2019: Magnesium 2.2 02/21/2020: ALT 11; Hemoglobin 10.4; Platelets 186 02/24/2020: BUN 63; Creatinine, Ser 5.00; Potassium 5.1; Sodium 137  Recent Lipid Panel    Component Value Date/Time   CHOL 86 (L) 01/02/2020 0952   TRIG 41 01/02/2020 0952   HDL 50 01/02/2020 0952   CHOLHDL 1.7 01/02/2020 0952   CHOLHDL 2.5  03/25/2019 0454   VLDL 16 03/25/2019 0454   LDLCALC 24 01/02/2020 0952    Physical Exam:    VS:  BP (!) 148/62   Pulse 88   Ht 6\' 1"  (1.854 m)   Wt 202 lb 12.8 oz (92 kg)   SpO2 97%   BMI 26.76 kg/m     Wt Readings from Last 3 Encounters:  02/27/20 202 lb 12.8 oz (92 kg)  02/19/20 210 lb 6 oz (95.4 kg)  02/15/20 210 lb 6 oz (95.4 kg)     GEN:  Well nourished, well developed in no acute distress HEENT: Normal NECK: No JVD; No carotid bruits LYMPHATICS: No lymphadenopathy CARDIAC: RRR, no murmurs, no rubs, no gallops RESPIRATORY:  Clear to auscultation without rales, wheezing or rhonchi  ABDOMEN: Soft, non-tender, non-distended MUSCULOSKELETAL:  No edema; No deformity  SKIN: Warm and dry LOWER EXTREMITIES: no swelling NEUROLOGIC:  Alert and oriented x 3 PSYCHIATRIC:  Normal affect   ASSESSMENT:    1. S/P CABG x 4   2. S/P Video Bronchoscopy, Right VATS with drainage of pleural effusion, decortication, intercostal nerve block   3. CKD (chronic kidney disease), stage V ([place])   4. Ischemic cardiomyopathy   5. Essential hypertension    PLAN:    In order of problems listed above:  1. Status post coronary bypass graft stable doing well from that point review 2. Status post bronchoscopy right VATS.  Doing well.  Still stitches present but air entry quite good on the left side. 3. Chronic kidney disease with creatinine high in the neighborhood 5.  He does have already AV shunt in the right arm. 4. Ischemic cardiomyopathy with ejection fraction 40 to 45%.  On appropriate medications.  I will ask him to increase dose of hydralazine from 10 mg 3 times daily to 25 mg 3 times a day. 5. Essential hypertension mildly elevated today but he forgot about appointment time he was rushing that probably has some issue with.  Again I will increase dose of hydralazine which should help. 6. History of pleural effusion status post bronchoscopy and VATS.  Doing well from that point  review   Medication Adjustments/Labs and Tests Ordered: Current medicines are reviewed at length with the patient today.  Concerns regarding medicines are outlined above.  No orders of the defined types were placed in this encounter.  Medication changes: No orders of the defined types were placed in this encounter.   Signed, Park Liter, MD, [facility] 02/27/2020 9:23 AM    [place]

## 2020-02-27 NOTE — Patient Instructions (Signed)
Medication Instructions:  Your physician has recommended you make the following change in your medication:   1.  Increase your hydralazine-hydralazine 25 mg one tablet by mouth three times a day (you may take hydralazine 20 mg - two of your 10 mg tablets three times a day until this medication is gone-then your new prescription will be for hydralazine 25 mg.  *If you need a refill on your cardiac medications before your next appointment, please call your pharmacy*  Lab Work: None ordered.  If you have labs (blood work) drawn today and your tests are completely normal, you will receive your results only by: Marland Kitchen MyChart Message (if you have MyChart) OR . A paper copy in the mail If you have any lab test that is abnormal or we need to change your treatment, we will call you to review the results.  Testing/Procedures: None ordered.  Follow-Up: At [facility], you and your health needs are our priority.  As part of our continuing mission to provide you with exceptional heart care, we have created designated Provider Care Teams.  These Care Teams include your primary Cardiologist (physician) and Advanced Practice Providers (APPs -  Physician Assistants and Nurse Practitioners) who all work together to provide you with the care you need, when you need it.  We recommend signing up for the patient portal called "MyChart".  Sign up information is provided on this After Visit Summary.  MyChart is used to connect with patients for Virtual Visits (Telemedicine).  Patients are able to view lab/test results, encounter notes, upcoming appointments, etc.  Non-urgent messages can be sent to your provider as well.   To learn more about what you can do with MyChart, go to [email or web address].    Your next appointment:   4 month(s)  The format for your next appointment:   In Person  Provider:   Jenne Campus, MD

## 2020-02-29 ENCOUNTER — Encounter (INDEPENDENT_AMBULATORY_CARE_PROVIDER_SITE_OTHER): Payer: Self-pay

## 2020-02-29 ENCOUNTER — Other Ambulatory Visit: Payer: Self-pay

## 2020-02-29 DIAGNOSIS — Z4802 Encounter for removal of sutures: Secondary | ICD-10-CM

## 2020-02-29 DIAGNOSIS — Z736 Limitation of activities due to disability: Secondary | ICD-10-CM

## 2020-03-06 LAB — CYTOLOGY - NON PAP

## 2020-03-07 ENCOUNTER — Other Ambulatory Visit: Payer: Self-pay | Admitting: Primary Care

## 2020-03-11 ENCOUNTER — Other Ambulatory Visit: Payer: Self-pay | Admitting: Thoracic Surgery (Cardiothoracic Vascular Surgery)

## 2020-03-11 DIAGNOSIS — Z951 Presence of aortocoronary bypass graft: Secondary | ICD-10-CM

## 2020-03-11 DIAGNOSIS — J90 Pleural effusion, not elsewhere classified: Secondary | ICD-10-CM

## 2020-03-12 ENCOUNTER — Ambulatory Visit (INDEPENDENT_AMBULATORY_CARE_PROVIDER_SITE_OTHER): Payer: Self-pay | Admitting: Thoracic Surgery (Cardiothoracic Vascular Surgery)

## 2020-03-12 ENCOUNTER — Other Ambulatory Visit: Payer: Self-pay

## 2020-03-12 ENCOUNTER — Ambulatory Visit
Admission: RE | Admit: 2020-03-12 | Discharge: 2020-03-12 | Disposition: A | Discharge status: Home / Self Care | Payer: Medicare HMO | Source: Ambulatory Visit | Attending: Thoracic Surgery (Cardiothoracic Vascular Surgery) | Admitting: Thoracic Surgery (Cardiothoracic Vascular Surgery)

## 2020-03-12 VITALS — BP 134/70 | HR 70 | Temp 98.1°F | Resp 20 | Ht 73.0 in | Wt 206.0 lb

## 2020-03-12 DIAGNOSIS — J90 Pleural effusion, not elsewhere classified: Secondary | ICD-10-CM

## 2020-03-12 DIAGNOSIS — Z951 Presence of aortocoronary bypass graft: Secondary | ICD-10-CM

## 2020-03-12 NOTE — Progress Notes (Signed)
John [name]       [place] [address]             [phone]     HPI: John [name] returns for a scheduled follow-up visit  John [name] is a 69 year old man with a history of coronary disease, non-ST elevation MI, coronary bypass grafting in May 2020, stage V chronic kidney disease, hypertension, hyperlipidemia, type 2 diabetes, and low back pain.  He had CABG in May 2020.  On postop follow-up he had a small left effusion.  He was treated with steroids.  A follow-up x-ray in July 2020 showed near complete resolution.  After that he had Covid.  This winter he developed shortness of breath with exertion.  He saw Dr. Agustin Cree.  A chest x-ray showed a loculated left pleural effusion.  Thoracentesis drained about 600 mL of fluid and there was an elevated LDH.  He had a lot of pain after thoracentesis.  His symptoms returned after about 5 or 6 days.  I did a left VATS for drainage of the effusion and decortication on 02/19/2020.  He did well postoperatively and went home on day 5.  Pathology showed chronic inflammation.  There was no evidence of malignancy.  He says that immediately after getting home his breathing was better than it had been prior to surgery.  He could walk up the hill from his mailbox to his house which he was not able to do previously.  He has a little bit of soreness in the chest and some radiation to the left upper quadrant.  He is not taking any narcotics.  Past Medical History:  Diagnosis Date  . Arthritis   . Carotid artery stenosis    30-94% LICA, 0-76% RICA 8/0/88 [place]  . Coronary artery disease   . Diabetes mellitus without complication ([place])   . Dyspnea   . Hypertension   . MI (myocardial infarction) ([place])   . Renal disorder   . Thoracic aortic aneurysm (TAA) ([facility])    4.3 cm ascending TAA 02/02/20 CT    Current Outpatient Medications  Medication Sig Dispense Refill  . acetaminophen (TYLENOL) 500 MG tablet Take 1,500 mg by mouth 2  (two) times daily as needed (pain).    Marland Kitchen albuterol (VENTOLIN HFA) 108 (90 Base) MCG/ACT inhaler TAKE 2 PUFFS BY MOUTH EVERY 6 HOURS AS NEEDED FOR WHEEZE OR SHORTNESS OF BREATH 6.7 g 3  . amLODipine (NORVASC) 5 MG tablet Take 5 mg by mouth daily.  12  . ascorbic acid (VITAMIN C) 500 MG tablet Take 500 mg by mouth daily.    Marland Kitchen aspirin EC 81 MG tablet Take 81 mg by mouth daily.    Marland Kitchen atorvastatin (LIPITOR) 40 MG tablet Take 40 mg by mouth daily in the afternoon.     . calcitRIOL (ROCALTROL) 0.5 MCG capsule Take 0.5 mcg by mouth daily.  3  . Dulaglutide (TRULICITY) 1.5 PJ/0.3PR SOPN Inject 1.5 mg into the skin every Sunday.    . ferrous sulfate 325 (65 FE) MG tablet Take 325 mg by mouth 2 (two) times daily with a meal.    . fluticasone (FLONASE) 50 MCG/ACT nasal spray Place 1 spray into both nostrils daily as needed for allergies or rhinitis.    Marland Kitchen gabapentin (NEURONTIN) 100 MG capsule Take 2 capsules (200 mg total) by mouth at bedtime.    Marland Kitchen glipiZIDE (GLUCOTROL XL) 2.5 MG 24 hr tablet Take 2.5 mg by mouth 2 (two) times daily.    Marland Kitchen  hydrALAZINE (APRESOLINE) 25 MG tablet Take 1 tablet (25 mg total) by mouth 3 (three) times daily. 270 tablet 3  . linagliptin (TRADJENTA) 5 MG TABS tablet Take 5 mg by mouth daily.    . metoprolol succinate (TOPROL-XL) 50 MG 24 hr tablet Take 50 mg by mouth daily.  0  . OVER THE COUNTER MEDICATION Place 1 drop into both eyes 2 (two) times daily as needed (dry eyes). Hyper Tears eye drops    . rOPINIRole (REQUIP) 0.25 MG tablet Take 0.25 mg by mouth at bedtime.    . sevelamer carbonate (RENVELA) 800 MG tablet Take 1 tablet (800 mg total) by mouth 3 (three) times daily with meals. 90 tablet 1  . tamsulosin (FLOMAX) 0.4 MG CAPS capsule Take 0.4 mg by mouth daily before supper.  2  . zolpidem (AMBIEN) 10 MG tablet Take 10 mg by mouth at bedtime.     No current facility-administered medications for this visit.    Physical Exam BP 134/70   Pulse 70   Temp 98.1 F (36.7 C)  (Skin)   Resp 20   Ht 6\' 1"  (1.854 m)   Wt 206 lb (93.4 kg)   SpO2 97%   BMI 27.96 kg/m  69 year old man in no acute distress Alert and oriented x3 with no focal deficits Lungs slightly diminished at left base but otherwise clear Cardiac regular rate and rhythm No peripheral edema  Diagnostic Tests: I personally reviewed the chest x-ray images.  The official report is not yet available.  There is a small area of pleural thickening versus effusion at the left base.  Overall there is markedly improved aeration of the left lower lobe.  Impression: John [name] is a 69 year old gentleman with a history of coronary disease, non-ST elevation MI, coronary bypass grafting in May 2020, stage V chronic kidney disease, hypertension, hyperlipidemia, type 2 diabetes, low back pain, and a recurrent pleural effusion.Marland Kitchen  He is now about 3 weeks out from surgery.  He has some discomfort but is not requiring any narcotics.  His discomfort is typical for this type of surgery.  It should continue to improve with time.  There are no restrictions on his activities.  He may begin driving a car.  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  He should build into new activities gradually.    Plan: Follow-up with Dr. Agustin Cree I will be happy to see John [name] back anytime in the future if I can be of any further assistance with his care  Melrose Nakayama, MD [facility] [phone]

## 2020-03-19 ENCOUNTER — Other Ambulatory Visit (HOSPITAL_COMMUNITY): Payer: Medicare HMO

## 2020-03-21 LAB — FUNGUS CULTURE WITH STAIN

## 2020-03-21 LAB — FUNGUS CULTURE RESULT

## 2020-03-21 LAB — FUNGAL ORGANISM REFLEX

## 2020-04-03 LAB — ACID FAST CULTURE WITH REFLEXED SENSITIVITIES (MYCOBACTERIA): Acid Fast Culture: NEGATIVE

## 2020-04-08 LAB — ACID FAST CULTURE WITH REFLEXED SENSITIVITIES (MYCOBACTERIA): Acid Fast Culture: NEGATIVE

## 2020-04-30 ENCOUNTER — Telehealth: Payer: Self-pay | Admitting: Cardiology

## 2020-04-30 DIAGNOSIS — R600 Localized edema: Secondary | ICD-10-CM

## 2020-04-30 NOTE — Telephone Encounter (Signed)
Pt c/o swelling: STAT is pt has developed SOB within 24 hours  1) How much weight have you gained and in what time span? No   2) If swelling, where is the swelling located? Ankles and Legs   3) Are you currently taking a fluid pill? No   4) Are you currently SOB? No, but has been been experiencing SOB   5) Do you have a log of your daily weights (if so, list)? No   6) Have you gained 3 pounds in a day or 5 pounds in a week? Doesn't know   7) Have you traveled recently? No   John [name] is calling in regards to his swelling in his ankles and legs. He states he will wake up and be fine, but it progressively gets worse when he is up and moving. He states the swelling gets so severe it causes his right leg tendon to hurt. He has to lay in the bed in order to get any relief and have the swelling go down at all. He is not currently on a fluid pill and has not kept up with his recent weight. An appointment has been scheduled in regards to this for Dr. Wendy Poet first available on 05/10/20, but he is requesting a sooner appointment if possible. Please advise.

## 2020-05-01 MED ORDER — TORSEMIDE 20 MG PO TABS
20.0000 mg | ORAL_TABLET | Freq: Every day | ORAL | 2 refills | Status: DC
Start: 2020-05-01 — End: 2020-08-29

## 2020-05-01 NOTE — Telephone Encounter (Signed)
Follow up    Pt is returning call Call transferred to [facility]

## 2020-05-01 NOTE — Telephone Encounter (Signed)
John [name] (pt of Dr.K) states that his legs and ankles are so swollen that it is causing hime pain. Pt states that he is short of breath as well. Pt is not on any fluid medication. How do you advise?

## 2020-05-01 NOTE — Telephone Encounter (Addendum)
Called patient. Informed him of Dr.Revankar's recommendations. Patient verbally understands to come have labs drawn and start torsemide 20 mg daily. Patient was scheduled to see Dr. Agustin Cree on Monday and advised to follow up with pcp as well. Patient informed to go to th emergency department if things don't get better. Patient verbally understood. No further questions.  Patient also reported his blood pressure on the phone with me was 138/78 and pulse was 80.

## 2020-05-01 NOTE — Addendum Note (Signed)
Addended by: Ashok Norris on: 05/01/2020 04:36 PM   Modules accepted: Orders

## 2020-05-01 NOTE — Telephone Encounter (Signed)
Left message to call back  

## 2020-05-01 NOTE — Telephone Encounter (Signed)
Please bring him in for a Chem-7 and start torsemide 20 mg daily.  I would like to know his pulse and blood pressure.  Please see if we can get him to see Dr. Agustin Cree early next week.  If this does not help he needs to go to the nearest emergency room for intravenous diuretics.  He also needs to call his primary care physician about this.  His renal function is very poor.

## 2020-05-06 ENCOUNTER — Other Ambulatory Visit: Payer: Self-pay

## 2020-05-06 ENCOUNTER — Ambulatory Visit: Payer: Medicare HMO | Admitting: Cardiology

## 2020-05-06 ENCOUNTER — Encounter: Payer: Self-pay | Admitting: Cardiology

## 2020-05-06 VITALS — BP 140/78 | HR 76 | Ht 73.0 in | Wt 201.0 lb

## 2020-05-06 DIAGNOSIS — Z8679 Personal history of other diseases of the circulatory system: Secondary | ICD-10-CM

## 2020-05-06 DIAGNOSIS — Z951 Presence of aortocoronary bypass graft: Secondary | ICD-10-CM | POA: Diagnosis not present

## 2020-05-06 DIAGNOSIS — I255 Ischemic cardiomyopathy: Secondary | ICD-10-CM | POA: Diagnosis not present

## 2020-05-06 DIAGNOSIS — Z09 Encounter for follow-up examination after completed treatment for conditions other than malignant neoplasm: Secondary | ICD-10-CM | POA: Diagnosis not present

## 2020-05-06 DIAGNOSIS — R0600 Dyspnea, unspecified: Secondary | ICD-10-CM

## 2020-05-06 MED ORDER — HYDRALAZINE HCL 10 MG PO TABS: 10.0000 mg | ORAL_TABLET | Freq: Three times a day (TID) | ORAL | 2 refills | Status: DC

## 2020-05-06 NOTE — Addendum Note (Signed)
Addended by: Ashok Norris on: 05/06/2020 10:09 AM   Modules accepted: Orders

## 2020-05-06 NOTE — Progress Notes (Signed)
Cardiology Office Note:    Date:  05/06/2020   ID:  John [name], DOB 11-Apr-1951, MRN [id number]  PCP:  Imagene Riches, NP  Cardiologist:  Jenne Campus, MD    Referring MD: Imagene Riches, NP   Chief Complaint  Patient presents with  . Follow-up  I have swollen legs  History of Present Illness:    John [name] is a 69 y.o. male   The patient has a history of coronary artery disease with prior PCI to the RCA, type 2 diabetes, hypertension, PAD status post aortofemoral bypass, end-stage IVchronic kidney disease.He was found to have elevated troponin, and therefore cardiac catheterization was completed on 03/27/2019.  Cardiac cath revealed severe multivessel disease to include 90% mid RCA lesion, ostial circumflex to proximal circumflex lesion 80% stenosed, proximal LAD to mid LAD 75% stenosed with markedly positive DFR 0.71, first diagonal was 70% stenosed, proximal RCA to mid RCA was 35% stenosed, distal RCA 1 lesion 40% stenosis was prior stent revealing mild in-stent restenosis, distal RCA to lesion 80% stenosed. The patient was referred to [facility] for surgical consultation for CABG.  The patient underwent a four-vessel CABG on 03/29/2019 with LIMA to LAD, SVG to diagonal, SVG to OM, and SVG to PDA. He had endoscopic harvesting of the greater saphenous vein from his right leg. Postoperatively he had thrombocytopenia on Lovenox and this was discontinued, he was also found to have postoperative CHF and was diuresed aggressively. He was followed by nephrology throughout hospital course. He was discharged on 04/03/2019  As a part of evaluation he had a chest x-ray done which showed some loculated large fluid in the left pleura, also echocardiogram being done under some suspicion for endocarditis of the aortic valve. TEE done today did not confirm presence of active endocarditis.  He did have some calcification of the leaflet of the aortic valve. He was also find to have  chronic pleural effusion on the 02/19/2020 he did have video bronchoscopy done as well as left VATS with drainage of empyema as well as decortication and intercostal nerve block.   He called [place] last week complaining of having swelling of lower extremities.  It happened especially at evening time.  He was given some torsemide with limited success.  Comes today to my office for follow-up.  Described to have fatigue tiredness.  He does not have proximal nocturnal dyspnea however he complained of having cough when he lays flat.  He also have to go quite often to the restroom in the middle of the night.  Swelling of lower extremities worse at evening time.  Right leg is slightly more swollen than the left 1.  This is the leg that he had vein taken for his bypass surgery.  Denies have any chest pain tightness squeezing pressure burning chest.  When he was taking 25 mg of hydralazine he felt very weak therefore he can discontinue hydralazine.  Past Medical History:  Diagnosis Date  . Arthritis   . Carotid artery stenosis    98-33% LICA, 8-25% RICA 0/5/39 [place]  . Coronary artery disease   . Diabetes mellitus without complication ([place])   . Dyspnea   . Hypertension   . MI (myocardial infarction) ([place])   . Renal disorder   . Thoracic aortic aneurysm (TAA) ([facility])    4.3 cm ascending TAA 02/02/20 CT    Past Surgical History:  Procedure Laterality Date  . ABDOMINAL AORTIC ANEURYSM REPAIR    . APPENDECTOMY    .  AV FISTULA PLACEMENT Right 10/18/2018   Procedure: BRACHIO-CEPHALIC ARTERIOVENOUS (AV) FISTULA CREATION;  Surgeon: Angelia Mould, MD;  Location: [place];  Service: Vascular;  Laterality: Right;  . CARDIAC CATHETERIZATION    . CORONARY ANGIOPLASTY    . CORONARY ARTERY BYPASS GRAFT N/A 03/29/2019   Procedure: CORONARY ARTERY BYPASS GRAFTING (CABG) x 4, using left internal mammary artery and right leg greater saphenous vein harvested endoscopically;  Surgeon: Melrose Nakayama, MD;  Location: [facility];  Service: Open Heart Surgery;  Laterality: N/A;  . CORONARY PRESSURE WIRE/FFR WITH 3D MAPPING N/A 03/27/2019   Procedure: Coronary Pressure Wire/FFR w/3D Mapping;  Surgeon: Jettie Booze, MD;  Location: [facility];  Service: Cardiovascular;  Laterality: N/A;  . DECORTICATION Left 02/19/2020   Procedure: DECORTICATION;  Surgeon: Melrose Nakayama, MD;  Location: [facility];  Service: Thoracic;  Laterality: Left;  . INTERCOSTAL NERVE BLOCK Left 02/19/2020   Procedure: Intercostal Nerve Block;  Surgeon: Melrose Nakayama, MD;  Location: [facility];  Service: Thoracic;  Laterality: Left;  . IR FLUORO GUIDE CV LINE RIGHT  09/13/2017  . IR REMOVAL TUN CV CATH W/O FL  10/22/2017  . IR US GUIDE VASC ACCESS RIGHT  09/13/2017  . LEFT HEART CATH AND CORONARY ANGIOGRAPHY N/A 03/27/2019   Procedure: LEFT HEART CATH AND CORONARY ANGIOGRAPHY;  Surgeon: Jettie Booze, MD;  Location: [facility];  Service: Cardiovascular;  Laterality: N/A;  . NECK SURGERY    . PLEURAL EFFUSION DRAINAGE Left 02/19/2020   Procedure: DRAINAGE OF PLEURAL EFFUSION;  Surgeon: Melrose Nakayama, MD;  Location: [facility];  Service: Thoracic;  Laterality: Left;  . SMALL INTESTINE SURGERY    . TEE WITHOUT CARDIOVERSION N/A 09/10/2017   Procedure: TRANSESOPHAGEAL ECHOCARDIOGRAM (TEE);  Surgeon: Skeet Latch, MD;  Location: [facility];  Service: Cardiovascular;  Laterality: N/A;  . TEE WITHOUT CARDIOVERSION N/A 03/29/2019   Procedure: TRANSESOPHAGEAL ECHOCARDIOGRAM (TEE);  Surgeon: Melrose Nakayama, MD;  Location: [facility];  Service: Open Heart Surgery;  Laterality: N/A;  . TEE WITHOUT CARDIOVERSION N/A 01/12/2020   Procedure: TRANSESOPHAGEAL ECHOCARDIOGRAM (TEE);  Surgeon: Sanda Klein, MD;  Location: [place];  Service: Cardiovascular;  Laterality: N/A;  . VIDEO ASSISTED THORACOSCOPY Left 02/19/2020   Procedure: VIDEO ASSISTED THORACOSCOPY;  Surgeon: Melrose Nakayama, MD;  Location: [facility];   Service: Thoracic;  Laterality: Left;  Marland Kitchen VIDEO BRONCHOSCOPY N/A 02/19/2020   Procedure: VIDEO BRONCHOSCOPY;  Surgeon: Melrose Nakayama, MD;  [facility];  Service: Thoracic;  Laterality: N/A;    Current Medications: Current Meds  Medication Sig  . acetaminophen (TYLENOL) 500 MG tablet Take 1,500 mg by mouth 2 (two) times daily as needed (pain).  Marland Kitchen albuterol (VENTOLIN HFA) 108 (90 Base) MCG/ACT inhaler TAKE 2 PUFFS BY MOUTH EVERY 6 HOURS AS NEEDED FOR WHEEZE OR SHORTNESS OF BREATH  . amLODipine (NORVASC) 5 MG tablet Take 5 mg by mouth daily.  Marland Kitchen aspirin EC 81 MG tablet Take 81 mg by mouth daily.  Marland Kitchen atorvastatin (LIPITOR) 40 MG tablet Take 40 mg by mouth daily in the afternoon.   . calcitRIOL (ROCALTROL) 0.5 MCG capsule Take 0.5 mcg by mouth daily.  . Dulaglutide (TRULICITY) 1.5 IR/5.1OA SOPN Inject 1.5 mg into the skin every Sunday.  . ferrous sulfate 325 (65 FE) MG tablet Take 325 mg by mouth 2 (two) times daily with a meal.  . fluticasone (FLONASE) 50 MCG/ACT nasal spray Place 1 spray into both nostrils daily as needed  for allergies or rhinitis.  Marland Kitchen gabapentin (NEURONTIN) 100 MG capsule Take 2 capsules (200 mg total) by mouth at bedtime.  Marland Kitchen glipiZIDE (GLUCOTROL XL) 2.5 MG 24 hr tablet Take 2.5 mg by mouth 2 (two) times daily.  . hydrALAZINE (APRESOLINE) 25 MG tablet Take 1 tablet (25 mg total) by mouth 3 (three) times daily.  Marland Kitchen linagliptin (TRADJENTA) 5 MG TABS tablet Take 5 mg by mouth daily.  . metoprolol succinate (TOPROL-XL) 50 MG 24 hr tablet Take 50 mg by mouth daily.  Marland Kitchen OVER THE COUNTER MEDICATION Place 1 drop into both eyes 2 (two) times daily as needed (dry eyes). Hyper Tears eye drops  . rOPINIRole (REQUIP) 0.25 MG tablet Take 0.25 mg by mouth at bedtime.  . sevelamer carbonate (RENVELA) 800 MG tablet Take 1 tablet (800 mg total) by mouth 3 (three) times daily with meals.  . torsemide (DEMADEX) 20 MG tablet Take 1 tablet (20 mg total) by mouth daily.  Marland Kitchen zolpidem (AMBIEN) 10  MG tablet Take 10 mg by mouth at bedtime.     Allergies:   Penicillins   Social History   Socioeconomic History  . Marital status: Married    Spouse name: Not on file  . Number of children: Not on file  . Years of education: Not on file  . Highest education level: Not on file  Occupational History  . Not on file  Tobacco Use  . Smoking status: Former Smoker    Packs/day: 2.00    Years: 30.00    Pack years: 60.00    Types: Cigarettes    Start date: 1968    Quit date: 1998    Years since quitting: 23.4  . Smokeless tobacco: Never Used  Vaping Use  . Vaping Use: Never used  Substance and Sexual Activity  . Alcohol use: Yes    Comment: Occasionally.  . Drug use: Never  . Sexual activity: Not on file  Other Topics Concern  . Not on file  Social History Narrative  . Not on file   Social Determinants of Health   Financial Resource Strain:   . Difficulty of Paying Living Expenses:   Food Insecurity:   . Worried About Charity fundraiser in the Last Year:   . Arboriculturist in the Last Year:   Transportation Needs:   . Film/video editor (Medical):   Marland Kitchen Lack of Transportation (Non-Medical):   Physical Activity:   . Days of Exercise per Week:   . Minutes of Exercise per Session:   Stress:   . Feeling of Stress :   Social Connections:   . Frequency of Communication with Friends and Family:   . Frequency of Social Gatherings with Friends and Family:   . Attends Religious Services:   . Active Member of Clubs or Organizations:   . Attends Archivist Meetings:   Marland Kitchen Marital Status:      Family History: The patient's family history includes Cancer in his maternal grandmother. ROS:   Please see the history of present illness.    All 14 point review of systems negative except as described per history of present illness  EKGs/Labs/Other Studies Reviewed:      Recent Labs: 02/21/2020: ALT 11; Hemoglobin 10.4; Platelets 186 02/24/2020: BUN 63; Creatinine,  Ser 5.00; Potassium 5.1; Sodium 137  Recent Lipid Panel    Component Value Date/Time   CHOL 86 (L) 01/02/2020 0952   TRIG 41 01/02/2020 0952   HDL 50 01/02/2020  0952   CHOLHDL 1.7 01/02/2020 0952   CHOLHDL 2.5 03/25/2019 0454   VLDL 16 03/25/2019 0454   LDLCALC 24 01/02/2020 0952    Physical Exam:    VS:  BP 140/78   Pulse 76   Ht 6\' 1"  (1.854 m)   Wt 201 lb (91.2 kg)   SpO2 96%   BMI 26.52 kg/m     Wt Readings from Last 3 Encounters:  05/06/20 201 lb (91.2 kg)  03/12/20 206 lb (93.4 kg)  02/27/20 202 lb 12.8 oz (92 kg)     GEN:  Well nourished, well developed in no acute distress HEENT: Normal NECK: No JVD; No carotid bruits LYMPHATICS: No lymphadenopathy CARDIAC: RRR, no murmurs, no rubs, no gallops RESPIRATORY:  Clear to auscultation without rales, wheezing or rhonchi  ABDOMEN: Soft, non-tender, non-distended MUSCULOSKELETAL: 1+ swelling of lower extremities SKIN: Warm and dry LOWER EXTREMITIES: no swelling NEUROLOGIC:  Alert and oriented x 3 PSYCHIATRIC:  Normal affect   ASSESSMENT:    1. Ischemic cardiomyopathy   2. S/P CABG x 4   3. S/P Video Bronchoscopy, Right VATS with drainage of pleural effusion, decortication, intercostal nerve block   4. History of endocarditis    PLAN:    In order of problems listed above:  1. Ischemic cardiomyopathy.  I will ask you to have proBNP as well as Chem-7 done today.  We will schedule him to have an echocardiogram to reassess left ventricle ejection fraction.  Future recommendation terms of therapy will depend on results of this test.  In the meantime I asked him to go back on 10 mg of hydralazine 3 times daily.  He was able to tolerate that dose without difficulty.  He was not able to tolerate 25 mg. 2. Status post coronary artery bypass grafting well from that point review. 3. Status post bronchoscopy decortication of his right lung.  Doing well from that point review.  However, he complained of having swelling of  lower extremities since the time of his surgery. 4. History of endocarditis questionable.  Last TEE showed calcification of the aortic valve but no active endocarditis.   Medication Adjustments/Labs and Tests Ordered: Current medicines are reviewed at length with the patient today.  Concerns regarding medicines are outlined above.  No orders of the defined types were placed in this encounter.  Medication changes: No orders of the defined types were placed in this encounter.   Signed, Park Liter, MD, [facility] 05/06/2020 9:56 AM    [place]

## 2020-05-06 NOTE — Patient Instructions (Signed)
Medication Instructions:  Your physician has recommended you make the following change in your medication:   START: Hydralazine 10 mg three times daily   *If you need a refill on your cardiac medications before your next appointment, please call your pharmacy*   Lab Work: Your physician recommends that you return for lab work today: bmp, pro bnp , cbc   If you have labs (blood work) drawn today and your tests are completely normal, you will receive your results only by: Marland Kitchen MyChart Message (if you have MyChart) OR . A paper copy in the mail If you have any lab test that is abnormal or we need to change your treatment, we will call you to review the results.   Testing/Procedures: Your physician has requested that you have an echocardiogram. Echocardiography is a painless test that uses sound waves to create images of your heart. It provides your doctor with information about the size and shape of your heart and how well your heart's chambers and valves are working. This procedure takes approximately one hour. There are no restrictions for this procedure.     Follow-Up: At [facility], you and your health needs are our priority.  As part of our continuing mission to provide you with exceptional heart care, we have created designated Provider Care Teams.  These Care Teams include your primary Cardiologist (physician) and Advanced Practice Providers (APPs -  Physician Assistants and Nurse Practitioners) who all work together to provide you with the care you need, when you need it.  We recommend signing up for the patient portal called "MyChart".  Sign up information is provided on this After Visit Summary.  MyChart is used to connect with patients for Virtual Visits (Telemedicine).  Patients are able to view lab/test results, encounter notes, upcoming appointments, etc.  Non-urgent messages can be sent to your provider as well.   To learn more about what you can do with MyChart, go to  [email or web address].    Your next appointment:   1 month(s)  The format for your next appointment:   In Person  Provider:   Jenne Campus, MD   Other Instructions   Echocardiogram An echocardiogram is a procedure that uses painless sound waves (ultrasound) to produce an image of the heart. Images from an echocardiogram can provide important information about:  Signs of coronary artery disease (CAD).  Aneurysm detection. An aneurysm is a weak or damaged part of an artery wall that bulges out from the normal force of blood pumping through the body.  Heart size and shape. Changes in the size or shape of the heart can be associated with certain conditions, including heart failure, aneurysm, and CAD.  Heart muscle function.  Heart valve function.  Signs of a past heart attack.  Fluid buildup around the heart.  Thickening of the heart muscle.  A tumor or infectious growth around the heart valves. Tell a health care provider about:  Any allergies you have.  All medicines you are taking, including vitamins, herbs, eye drops, creams, and over-the-counter medicines.  Any blood disorders you have.  Any surgeries you have had.  Any medical conditions you have.  Whether you are pregnant or may be pregnant. What are the risks? Generally, this is a safe procedure. However, problems may occur, including:  Allergic reaction to dye (contrast) that may be used during the procedure. What happens before the procedure? No specific preparation is needed. You may eat and drink normally. What happens during the procedure?  An IV tube may be inserted into one of your veins.  You may receive contrast through this tube. A contrast is an injection that improves the quality of the pictures from your heart.  A gel will be applied to your chest.  A wand-like tool (transducer) will be moved over your chest. The gel will help to transmit the sound waves from the  transducer.  The sound waves will harmlessly bounce off of your heart to allow the heart images to be captured in real-time motion. The images will be recorded on a computer. The procedure may vary among health care providers and hospitals. What happens after the procedure?  You may return to your normal, everyday life, including diet, activities, and medicines, unless your health care provider tells you not to do that. Summary  An echocardiogram is a procedure that uses painless sound waves (ultrasound) to produce an image of the heart.  Images from an echocardiogram can provide important information about the size and shape of your heart, heart muscle function, heart valve function, and fluid buildup around your heart.  You do not need to do anything to prepare before this procedure. You may eat and drink normally.  After the echocardiogram is completed, you may return to your normal, everyday life, unless your health care provider tells you not to do that. This information is not intended to replace advice given to you by your health care provider. Make sure you discuss any questions you have with your health care provider. Document Revised: 03/02/2019 Document Reviewed: 12/12/2016 Elsevier Patient Education  [place].

## 2020-05-07 LAB — CBC
Hematocrit: 31.4 % — ABNORMAL LOW (ref 37.5–51.0)
Hemoglobin: 9.5 g/dL — ABNORMAL LOW (ref 13.0–17.7)
MCH: 27.3 pg (ref 26.6–33.0)
MCHC: 30.3 g/dL — ABNORMAL LOW (ref 31.5–35.7)
MCV: 90 fL (ref 79–97)
Platelets: 169 10*3/uL (ref 150–450)
RBC: 3.48 x10E6/uL — ABNORMAL LOW (ref 4.14–5.80)
RDW: 16 % — ABNORMAL HIGH (ref 11.6–15.4)
WBC: 5.7 10*3/uL (ref 3.4–10.8)

## 2020-05-07 LAB — BASIC METABOLIC PANEL
BUN/Creatinine Ratio: 10 (ref 10–24)
BUN: 53 mg/dL — ABNORMAL HIGH (ref 8–27)
CO2: 17 mmol/L — ABNORMAL LOW (ref 20–29)
Calcium: 9.7 mg/dL (ref 8.6–10.2)
Chloride: 105 mmol/L (ref 96–106)
Creatinine, Ser: 5.22 mg/dL — ABNORMAL HIGH (ref 0.76–1.27)
GFR calc Af Amer: 12 mL/min/{1.73_m2} — ABNORMAL LOW (ref >59)
GFR calc non Af Amer: 11 mL/min/{1.73_m2} — ABNORMAL LOW (ref >59)
Glucose: 138 mg/dL — ABNORMAL HIGH (ref 65–99)
Potassium: 5.1 mmol/L (ref 3.5–5.2)
Sodium: 140 mmol/L (ref 134–144)

## 2020-05-07 LAB — PRO B NATRIURETIC PEPTIDE: NT-Pro BNP: 14398 pg/mL — ABNORMAL HIGH (ref 0–376)

## 2020-05-10 ENCOUNTER — Ambulatory Visit: Payer: Medicare HMO | Admitting: Cardiology

## 2020-05-31 ENCOUNTER — Telehealth: Payer: Self-pay | Admitting: Cardiology

## 2020-05-31 DIAGNOSIS — I255 Ischemic cardiomyopathy: Secondary | ICD-10-CM

## 2020-05-31 DIAGNOSIS — I429 Cardiomyopathy, unspecified: Secondary | ICD-10-CM

## 2020-05-31 NOTE — Telephone Encounter (Signed)
New message   Patient would like to have an echo done before his next app. Please put order in system and call the patient.

## 2020-06-25 ENCOUNTER — Ambulatory Visit: Payer: Medicare HMO | Admitting: Cardiology

## 2020-07-19 ENCOUNTER — Ambulatory Visit (HOSPITAL_BASED_OUTPATIENT_CLINIC_OR_DEPARTMENT_OTHER)
Admission: RE | Admit: 2020-07-19 | Discharge: 2020-07-19 | Disposition: A | Discharge status: Home / Self Care | Payer: Medicare HMO | Source: Ambulatory Visit | Attending: Cardiology | Admitting: Cardiology

## 2020-07-19 ENCOUNTER — Other Ambulatory Visit: Payer: Self-pay

## 2020-07-19 DIAGNOSIS — I429 Cardiomyopathy, unspecified: Secondary | ICD-10-CM

## 2020-07-19 DIAGNOSIS — Z87891 Personal history of nicotine dependence: Secondary | ICD-10-CM | POA: Diagnosis not present

## 2020-07-19 DIAGNOSIS — E1122 Type 2 diabetes mellitus with diabetic chronic kidney disease: Secondary | ICD-10-CM | POA: Diagnosis not present

## 2020-07-19 DIAGNOSIS — I700 Atherosclerosis of aorta: Secondary | ICD-10-CM | POA: Diagnosis not present

## 2020-07-19 DIAGNOSIS — R531 Weakness: Secondary | ICD-10-CM | POA: Diagnosis not present

## 2020-07-19 DIAGNOSIS — E785 Hyperlipidemia, unspecified: Secondary | ICD-10-CM | POA: Diagnosis not present

## 2020-07-19 DIAGNOSIS — Z951 Presence of aortocoronary bypass graft: Secondary | ICD-10-CM | POA: Insufficient documentation

## 2020-07-19 DIAGNOSIS — N185 Chronic kidney disease, stage 5: Secondary | ICD-10-CM | POA: Diagnosis not present

## 2020-07-19 DIAGNOSIS — I081 Rheumatic disorders of both mitral and tricuspid valves: Secondary | ICD-10-CM | POA: Diagnosis not present

## 2020-07-19 DIAGNOSIS — I130 Hypertensive heart and chronic kidney disease with heart failure and stage 1 through stage 4 chronic kidney disease, or unspecified chronic kidney disease: Secondary | ICD-10-CM | POA: Diagnosis not present

## 2020-07-19 DIAGNOSIS — I252 Old myocardial infarction: Secondary | ICD-10-CM | POA: Diagnosis not present

## 2020-07-19 DIAGNOSIS — R600 Localized edema: Secondary | ICD-10-CM | POA: Diagnosis not present

## 2020-07-19 LAB — ECHOCARDIOGRAM COMPLETE
AR max vel: 3.21 cm2
AV Area VTI: 3.31 cm2
AV Area mean vel: 3 cm2
AV Mean grad: 3 mmHg
AV Peak grad: 6.2 mmHg
Ao pk vel: 1.24 m/s
Area-P 1/2: 4.63 cm2
Calc EF: 29.3 %
MV M vel: 2.6 m/s
MV Peak grad: 27 mmHg
Radius: 0.7 cm
S' Lateral: 5.55 cm
Single Plane A2C EF: 28.3 %
Single Plane A4C EF: 29.9 %

## 2020-07-24 ENCOUNTER — Other Ambulatory Visit: Payer: Self-pay

## 2020-07-24 ENCOUNTER — Encounter: Payer: Self-pay | Admitting: Cardiology

## 2020-07-24 ENCOUNTER — Ambulatory Visit: Payer: Medicare HMO | Admitting: Cardiology

## 2020-07-24 VITALS — BP 140/70 | HR 72 | Ht 73.0 in | Wt 207.0 lb

## 2020-07-24 DIAGNOSIS — I10 Essential (primary) hypertension: Secondary | ICD-10-CM | POA: Diagnosis not present

## 2020-07-24 DIAGNOSIS — I255 Ischemic cardiomyopathy: Secondary | ICD-10-CM

## 2020-07-24 DIAGNOSIS — Z09 Encounter for follow-up examination after completed treatment for conditions other than malignant neoplasm: Secondary | ICD-10-CM

## 2020-07-24 DIAGNOSIS — N185 Chronic kidney disease, stage 5: Secondary | ICD-10-CM

## 2020-07-24 DIAGNOSIS — Z951 Presence of aortocoronary bypass graft: Secondary | ICD-10-CM

## 2020-07-24 NOTE — Progress Notes (Signed)
Cardiology Office Note:    Date:  07/24/2020   ID:  [name], DOB 1951/08/27, MRN [id number]  PCP:  Imagene Riches, NP  Cardiologist:  Jenne Campus, MD    Referring MD: Imagene Riches, NP   Chief Complaint  Patient presents with  . Follow-up  Am still exhausted and short of breath  History of Present Illness:    John [name] is a 69 y.o. male  The patient has a history of coronary artery disease with prior PCI to the RCA, type 2 diabetes, hypertension, PAD status post aortofemoral bypass, end-stage IVchronic kidney disease.He was found to have elevated troponin, and therefore cardiac catheterization was completed on 03/27/2019.  Cardiac cath revealed severe multivessel disease to include 90% mid RCA lesion, ostial circumflex to proximal circumflex lesion 80% stenosed, proximal LAD to mid LAD 75% stenosed with markedly positive DFR 0.71, first diagonal was 70% stenosed, proximal RCA to mid RCA was 35% stenosed, distal RCA 1 lesion 40% stenosis was prior stent revealing mild in-stent restenosis, distal RCA to lesion 80% stenosed. The patient was referred to CVTS for surgical consultation for CABG.  The patient underwent a four-vessel CABG on 03/29/2019 with LIMA to LAD, SVG to diagonal, SVG to OM, and SVG to PDA. He had endoscopic harvesting of the greater saphenous vein from his right leg. Postoperatively he had thrombocytopenia on Lovenox and this was discontinued, he was also found to have postoperative CHF and was diuresed aggressively. He was followed by nephrology throughout hospital course. He was discharged on 04/03/2019  As a part of evaluation he had a chest x-ray done which showed some loculated large fluid in the left pleura, also echocardiogram being done under some suspicion for endocarditis of the aortic valve. TEE done today did not confirm presence of active endocarditis. He did have some calcification of the leaflet of the aortic valve. He was also  find to have chronic pleural effusion on the 02/19/2020 he did have video bronchoscopy done as well as leftVATSwith drainage of empyema as well as decortication and intercostal nerve block.  Comes today 2 months of follow-up.  Her dose of diuretic has been increased by nephrologist, legs are much less swollen but still complain of being weak tired exhausted and easily getting short of breath.  I did repeat echocardiogram on him which showed ejection fraction 4045%, as before.  Past Medical History:  Diagnosis Date  . Arthritis   . Carotid artery stenosis    27-78% LICA, 2-42% RICA 01/26/35 [place]  . Coronary artery disease   . Diabetes mellitus without complication ([place])   . Dyspnea   . Hypertension   . MI (myocardial infarction) ([place])   . Renal disorder   . Thoracic aortic aneurysm (TAA) ([facility])    4.3 cm ascending TAA 02/02/20 CT    Past Surgical History:  Procedure Laterality Date  . ABDOMINAL AORTIC ANEURYSM REPAIR    . APPENDECTOMY    . AV FISTULA PLACEMENT Right 10/18/2018   Procedure: BRACHIO-CEPHALIC ARTERIOVENOUS (AV) FISTULA CREATION;  Surgeon: Angelia Mould, MD;  Location: [place];  Service: Vascular;  Laterality: Right;  . CARDIAC CATHETERIZATION    . CORONARY ANGIOPLASTY    . CORONARY ARTERY BYPASS GRAFT N/A 03/29/2019   Procedure: CORONARY ARTERY BYPASS GRAFTING (CABG) x 4, using left internal mammary artery and right leg greater saphenous vein harvested endoscopically;  Surgeon: Melrose Nakayama, MD;  Location: [place];  Service: Open Heart Surgery;  Laterality: N/A;  .  CORONARY PRESSURE WIRE/FFR WITH 3D MAPPING N/A 03/27/2019   Procedure: Coronary Pressure Wire/FFR w/3D Mapping;  Surgeon: Jettie Booze, MD;  Location: [place] CV [facility];  Service: Cardiovascular;  Laterality: N/A;  . DECORTICATION Left 02/19/2020   Procedure: DECORTICATION;  Surgeon: Melrose Nakayama, MD;  Location: [place];  Service: Thoracic;  Laterality: Left;  . INTERCOSTAL NERVE BLOCK Left  02/19/2020   Procedure: Intercostal Nerve Block;  Surgeon: Melrose Nakayama, MD;  Location: [place];  Service: Thoracic;  Laterality: Left;  . IR FLUORO GUIDE CV LINE RIGHT  09/13/2017  . IR REMOVAL TUN CV CATH W/O FL  10/22/2017  . IR US GUIDE VASC ACCESS RIGHT  09/13/2017  . LEFT HEART CATH AND CORONARY ANGIOGRAPHY N/A 03/27/2019   Procedure: LEFT HEART CATH AND CORONARY ANGIOGRAPHY;  Surgeon: Jettie Booze, MD;  Location: [place] [facility];  Service: Cardiovascular;  Laterality: N/A;  . NECK SURGERY    . PLEURAL EFFUSION DRAINAGE Left 02/19/2020   Procedure: DRAINAGE OF PLEURAL EFFUSION;  Surgeon: Melrose Nakayama, MD;  Location: [place];  Service: Thoracic;  Laterality: Left;  . SMALL INTESTINE SURGERY    . TEE WITHOUT CARDIOVERSION N/A 09/10/2017   Procedure: TRANSESOPHAGEAL ECHOCARDIOGRAM (TEE);  Surgeon: Skeet Latch, MD;  Location: [place];  Service: Cardiovascular;  Laterality: N/A;  . TEE WITHOUT CARDIOVERSION N/A 03/29/2019   Procedure: TRANSESOPHAGEAL ECHOCARDIOGRAM (TEE);  Surgeon: Melrose Nakayama, MD;  Location: [place];  Service: Open Heart Surgery;  Laterality: N/A;  . TEE WITHOUT CARDIOVERSION N/A 01/12/2020   Procedure: TRANSESOPHAGEAL ECHOCARDIOGRAM (TEE);  Surgeon: Sanda Klein, MD;  Location: [place];  Service: Cardiovascular;  Laterality: N/A;  . VIDEO ASSISTED THORACOSCOPY Left 02/19/2020   Procedure: VIDEO ASSISTED THORACOSCOPY;  Surgeon: Melrose Nakayama, MD;  Location: [place];  Service: Thoracic;  Laterality: Left;  Marland Kitchen VIDEO BRONCHOSCOPY N/A 02/19/2020   Procedure: VIDEO BRONCHOSCOPY;  Surgeon: Melrose Nakayama, MD;  Location: [facility];  Service: Thoracic;  Laterality: N/A;    Current Medications: Current Meds  Medication Sig  . acetaminophen (TYLENOL) 500 MG tablet Take 1,500 mg by mouth 2 (two) times daily as needed (pain).  Marland Kitchen albuterol (VENTOLIN HFA) 108 (90 Base) MCG/ACT inhaler TAKE 2 PUFFS BY MOUTH EVERY 6 HOURS AS NEEDED FOR  WHEEZE OR SHORTNESS OF BREATH  . amLODipine (NORVASC) 5 MG tablet Take 5 mg by mouth daily.  Marland Kitchen aspirin EC 81 MG tablet Take 81 mg by mouth daily.  Marland Kitchen atorvastatin (LIPITOR) 40 MG tablet Take 40 mg by mouth daily in the afternoon.   . calcitRIOL (ROCALTROL) 0.5 MCG capsule Take 0.5 mcg by mouth daily.  . Dulaglutide (TRULICITY) 1.5 IW/8.0HO SOPN Inject 1.5 mg into the skin every Sunday.  . ferrous sulfate 325 (65 FE) MG tablet Take 325 mg by mouth 2 (two) times daily with a meal.  . fluticasone (FLONASE) 50 MCG/ACT nasal spray Place 1 spray into both nostrils daily as needed for allergies or rhinitis.  Marland Kitchen gabapentin (NEURONTIN) 100 MG capsule Take 2 capsules (200 mg total) by mouth at bedtime.  Marland Kitchen glipiZIDE (GLUCOTROL XL) 2.5 MG 24 hr tablet Take 2.5 mg by mouth 2 (two) times daily.  . hydrALAZINE (APRESOLINE) 10 MG tablet Take 1 tablet (10 mg total) by mouth 3 (three) times daily.  Marland Kitchen linagliptin (TRADJENTA) 5 MG TABS tablet Take 5 mg by mouth daily.  . metoprolol succinate (TOPROL-XL) 50 MG 24 hr tablet Take 50 mg by mouth daily.  Marland Kitchen OVER  THE COUNTER MEDICATION Place 1 drop into both eyes 2 (two) times daily as needed (dry eyes). Hyper Tears eye drops  . rOPINIRole (REQUIP) 0.25 MG tablet Take 0.25 mg by mouth at bedtime.  . sevelamer carbonate (RENVELA) 800 MG tablet Take 1 tablet (800 mg total) by mouth 3 (three) times daily with meals.  . torsemide (DEMADEX) 20 MG tablet Take 1 tablet (20 mg total) by mouth daily. (Patient taking differently: Take 40 mg by mouth daily. )  . zolpidem (AMBIEN) 10 MG tablet Take 10 mg by mouth at bedtime.     Allergies:   Penicillins   Social History   Socioeconomic History  . Marital status: Married    Spouse name: Not on file  . Number of children: Not on file  . Years of education: Not on file  . Highest education level: Not on file  Occupational History  . Not on file  Tobacco Use  . Smoking status: Former Smoker    Packs/day: 2.00    Years: 30.00      Pack years: 60.00    Types: Cigarettes    Start date: 1968    Quit date: 1998    Years since quitting: 23.6  . Smokeless tobacco: Never Used  Vaping Use  . Vaping Use: Never used  Substance and Sexual Activity  . Alcohol use: Yes    Comment: Occasionally.  . Drug use: Never  . Sexual activity: Not on file  Other Topics Concern  . Not on file  Social History Narrative  . Not on file   Social Determinants of Health   Financial Resource Strain:   . Difficulty of Paying Living Expenses: Not on file  Food Insecurity:   . Worried About Charity fundraiser in the Last Year: Not on file  . Ran Out of Food in the Last Year: Not on file  Transportation Needs:   . Lack of Transportation (Medical): Not on file  . Lack of Transportation (Non-Medical): Not on file  Physical Activity:   . Days of Exercise per Week: Not on file  . Minutes of Exercise per Session: Not on file  Stress:   . Feeling of Stress : Not on file  Social Connections:   . Frequency of Communication with Friends and Family: Not on file  . Frequency of Social Gatherings with Friends and Family: Not on file  . Attends Religious Services: Not on file  . Active Member of Clubs or Organizations: Not on file  . Attends Archivist Meetings: Not on file  . Marital Status: Not on file     Family History: The patient's family history includes Cancer in his maternal grandmother. ROS:   Please see the history of present illness.    All 14 point review of systems negative except as described per history of present illness  EKGs/Labs/Other Studies Reviewed:      Recent Labs: 02/21/2020: ALT 11 05/06/2020: BUN 53; Creatinine, Ser 5.22; Hemoglobin 9.5; NT-Pro BNP 14,398; Platelets 169; Potassium 5.1; Sodium 140  Recent Lipid Panel    Component Value Date/Time   CHOL 86 (L) 01/02/2020 0952   TRIG 41 01/02/2020 0952   HDL 50 01/02/2020 0952   CHOLHDL 1.7 01/02/2020 0952   CHOLHDL 2.5 03/25/2019 0454    VLDL 16 03/25/2019 0454   LDLCALC 24 01/02/2020 0952    Physical Exam:    VS:  BP 140/70 (BP Location: Left Arm, Patient Position: Sitting, Cuff Size: Normal)   Pulse  72   Ht 6\' 1"  (1.854 m)   Wt 207 lb (93.9 kg)   SpO2 97%   BMI 27.31 kg/m     Wt Readings from Last 3 Encounters:  07/24/20 207 lb (93.9 kg)  05/06/20 201 lb (91.2 kg)  03/12/20 206 lb (93.4 kg)     GEN:  Well nourished, well developed in no acute distress HEENT: Normal NECK: No JVD; No carotid bruits LYMPHATICS: No lymphadenopathy CARDIAC: RRR, no murmurs, no rubs, no gallops RESPIRATORY:  Clear to auscultation without rales, wheezing or rhonchi, minimal crackles left base ABDOMEN: Soft, non-tender, non-distended MUSCULOSKELETAL: Minimal swelling SKIN: Warm and dry LOWER EXTREMITIES: no swelling NEUROLOGIC:  Alert and oriented x 3 PSYCHIATRIC:  Normal affect   ASSESSMENT:    1. S/P CABG x 4   2. S/P Video Bronchoscopy, Right VATS with drainage of pleural effusion, decortication, intercostal nerve block   3. CKD (chronic kidney disease), stage V ([place])   4. Essential hypertension   5. Ischemic cardiomyopathy    PLAN:    In order of problems listed above:  1. Status post coronary bypass grafting well from that point review. 2. Status post bronchoscopy with VATS.  Doing well.  There is some crackles left base of his lungs. 3. Chronic kidney disease which clearly is contributing factor.  I think getting close to having dialysis done.  That will help manage his fluids.  I do not find him particularly overloaded with fluid today, however, his weight is up he still complaining having shortness of breath.  I will ask him to have Chem-7 as well as proBNP done to see if I can augment his diuresis. 4. Essential hypertension: Blood pressure seems to be well controlled continue present medications. 5. Ischemic cardiomyopathy on appropriate medication that he is able to tolerate and I will continue for  now. 6. Dyslipidemia he is on high intense statin in form of Lipitor 40 which I will continue.  I did review his K PN which showing LDL of 44 and HDL 50.  We will continue present management.   Medication Adjustments/Labs and Tests Ordered: Current medicines are reviewed at length with the patient today.  Concerns regarding medicines are outlined above.  No orders of the defined types were placed in this encounter.  Medication changes: No orders of the defined types were placed in this encounter.   Signed, Park Liter, MD, [facility] 07/24/2020 9:47 AM    [place]

## 2020-07-24 NOTE — Patient Instructions (Signed)
Medication Instructions:  Your physician recommends that you continue on your current medications as directed. Please refer to the Current Medication list given to you today.  *If you need a refill on your cardiac medications before your next appointment, please call your pharmacy*   Lab Work: Your physician recommends that you return for lab work today: bmp , pro bnp  If you have labs (blood work) drawn today and your tests are completely normal, you will receive your results only by: . MyChart Message (if you have MyChart) OR . A paper copy in the mail If you have any lab test that is abnormal or we need to change your treatment, we will call you to review the results.   Testing/Procedures: None   Follow-Up: At [facility], you and your health needs are our priority.  As part of our continuing mission to provide you with exceptional heart care, we have created designated Provider Care Teams.  These Care Teams include your primary Cardiologist (physician) and Advanced Practice Providers (APPs -  Physician Assistants and Nurse Practitioners) who all work together to provide you with the care you need, when you need it.  We recommend signing up for the patient portal called "MyChart".  Sign up information is provided on this After Visit Summary.  MyChart is used to connect with patients for Virtual Visits (Telemedicine).  Patients are able to view lab/test results, encounter notes, upcoming appointments, etc.  Non-urgent messages can be sent to your provider as well.   To learn more about what you can do with MyChart, go to [email or web address].    Your next appointment:   3 month(s)  The format for your next appointment:   In Person  Provider:   Robert Krasowski, MD   Other Instructions   

## 2020-07-24 NOTE — Addendum Note (Signed)
Addended by: Ashok Norris on: 07/24/2020 09:53 AM   Modules accepted: Orders

## 2020-07-25 LAB — BASIC METABOLIC PANEL
BUN/Creatinine Ratio: 10 (ref 10–24)
BUN: 58 mg/dL — ABNORMAL HIGH (ref 8–27)
CO2: 18 mmol/L — ABNORMAL LOW (ref 20–29)
Calcium: 9.8 mg/dL (ref 8.6–10.2)
Chloride: 105 mmol/L (ref 96–106)
Creatinine, Ser: 5.8 mg/dL — ABNORMAL HIGH (ref 0.76–1.27)
GFR calc Af Amer: 11 mL/min/{1.73_m2} — ABNORMAL LOW (ref >59)
GFR calc non Af Amer: 9 mL/min/{1.73_m2} — ABNORMAL LOW (ref >59)
Glucose: 199 mg/dL — ABNORMAL HIGH (ref 65–99)
Potassium: 5.3 mmol/L — ABNORMAL HIGH (ref 3.5–5.2)
Sodium: 141 mmol/L (ref 134–144)

## 2020-07-25 LAB — PRO B NATRIURETIC PEPTIDE: NT-Pro BNP: 20733 pg/mL — ABNORMAL HIGH (ref 0–376)

## 2020-07-31 ENCOUNTER — Telehealth: Payer: Self-pay | Admitting: Emergency Medicine

## 2020-07-31 DIAGNOSIS — Z79899 Other long term (current) drug therapy: Secondary | ICD-10-CM

## 2020-07-31 NOTE — Telephone Encounter (Signed)
Called patient informed him of results. He reports he is still not feeling well. Shortness of breath remains and swelling. He will come for labs tomorrow. Will consult with Dr. Agustin Cree.

## 2020-07-31 NOTE — Telephone Encounter (Signed)
-----   Message from Park Liter, MD sent at 07/31/2020 11:19 AM EDT ----- Please call him and ask him how he is doing, also he need to have repeated Chem-7, last time his potassium was elevated.

## 2020-08-01 NOTE — Telephone Encounter (Signed)
Called patient informed him that when we get results we will call him. He was also advised to follow up with nephrologists as he may need dialysis. Patient verbally understood. No further questions.

## 2020-08-01 NOTE — Telephone Encounter (Signed)
I would like to see his Chem-7, I still is a still active in the computer but not done yet.  He may need to contact his nephrologist because last time when we check his kidney function which was about a week ago his creatinine was 5.8.  I think is very close to getting on dialysis.  He need to talk to his nephrologist

## 2020-08-02 LAB — BASIC METABOLIC PANEL
BUN/Creatinine Ratio: 12 (ref 10–24)
BUN: 63 mg/dL — ABNORMAL HIGH (ref 8–27)
CO2: 20 mmol/L (ref 20–29)
Calcium: 9.9 mg/dL (ref 8.6–10.2)
Chloride: 105 mmol/L (ref 96–106)
Creatinine, Ser: 5.46 mg/dL — ABNORMAL HIGH (ref 0.76–1.27)
GFR calc Af Amer: 12 mL/min/{1.73_m2} — ABNORMAL LOW (ref >59)
GFR calc non Af Amer: 10 mL/min/{1.73_m2} — ABNORMAL LOW (ref >59)
Glucose: 91 mg/dL (ref 65–99)
Potassium: 5 mmol/L (ref 3.5–5.2)
Sodium: 140 mmol/L (ref 134–144)

## 2020-08-05 ENCOUNTER — Telehealth: Payer: Self-pay | Admitting: Emergency Medicine

## 2020-08-05 NOTE — Telephone Encounter (Signed)
Follow Up:    Returning your call from today, concerning his lab results.

## 2020-08-05 NOTE — Telephone Encounter (Signed)
Called patient informed him of results. No further questions.  

## 2020-08-26 ENCOUNTER — Emergency Department (HOSPITAL_COMMUNITY): Payer: Medicare HMO

## 2020-08-26 ENCOUNTER — Encounter (HOSPITAL_COMMUNITY): Payer: Self-pay

## 2020-08-26 ENCOUNTER — Other Ambulatory Visit: Payer: Self-pay

## 2020-08-26 ENCOUNTER — Inpatient Hospital Stay (HOSPITAL_COMMUNITY)
Admission: EM | Admit: 2020-08-26 | Discharge: 2020-08-29 | DRG: 683 | Disposition: A | Discharge status: Home / Self Care | Payer: Medicare HMO | Attending: Internal Medicine | Admitting: Internal Medicine

## 2020-08-26 DIAGNOSIS — E872 Acidosis: Secondary | ICD-10-CM | POA: Diagnosis present

## 2020-08-26 DIAGNOSIS — Z951 Presence of aortocoronary bypass graft: Secondary | ICD-10-CM

## 2020-08-26 DIAGNOSIS — Z955 Presence of coronary angioplasty implant and graft: Secondary | ICD-10-CM

## 2020-08-26 DIAGNOSIS — N185 Chronic kidney disease, stage 5: Secondary | ICD-10-CM

## 2020-08-26 DIAGNOSIS — N186 End stage renal disease: Secondary | ICD-10-CM | POA: Diagnosis not present

## 2020-08-26 DIAGNOSIS — I132 Hypertensive heart and chronic kidney disease with heart failure and with stage 5 chronic kidney disease, or end stage renal disease: Secondary | ICD-10-CM | POA: Diagnosis present

## 2020-08-26 DIAGNOSIS — E1151 Type 2 diabetes mellitus with diabetic peripheral angiopathy without gangrene: Secondary | ICD-10-CM | POA: Diagnosis present

## 2020-08-26 DIAGNOSIS — D509 Iron deficiency anemia, unspecified: Secondary | ICD-10-CM | POA: Diagnosis present

## 2020-08-26 DIAGNOSIS — Z7982 Long term (current) use of aspirin: Secondary | ICD-10-CM

## 2020-08-26 DIAGNOSIS — I252 Old myocardial infarction: Secondary | ICD-10-CM

## 2020-08-26 DIAGNOSIS — E11649 Type 2 diabetes mellitus with hypoglycemia without coma: Secondary | ICD-10-CM | POA: Diagnosis not present

## 2020-08-26 DIAGNOSIS — Z87891 Personal history of nicotine dependence: Secondary | ICD-10-CM

## 2020-08-26 DIAGNOSIS — Z79899 Other long term (current) drug therapy: Secondary | ICD-10-CM

## 2020-08-26 DIAGNOSIS — E1122 Type 2 diabetes mellitus with diabetic chronic kidney disease: Secondary | ICD-10-CM | POA: Diagnosis present

## 2020-08-26 DIAGNOSIS — N179 Acute kidney failure, unspecified: Secondary | ICD-10-CM | POA: Diagnosis not present

## 2020-08-26 DIAGNOSIS — E785 Hyperlipidemia, unspecified: Secondary | ICD-10-CM | POA: Diagnosis present

## 2020-08-26 DIAGNOSIS — I2510 Atherosclerotic heart disease of native coronary artery without angina pectoris: Secondary | ICD-10-CM | POA: Diagnosis present

## 2020-08-26 DIAGNOSIS — I10 Essential (primary) hypertension: Secondary | ICD-10-CM | POA: Diagnosis present

## 2020-08-26 DIAGNOSIS — L299 Pruritus, unspecified: Secondary | ICD-10-CM | POA: Diagnosis present

## 2020-08-26 DIAGNOSIS — I5042 Chronic combined systolic (congestive) and diastolic (congestive) heart failure: Secondary | ICD-10-CM | POA: Diagnosis present

## 2020-08-26 DIAGNOSIS — Z7984 Long term (current) use of oral hypoglycemic drugs: Secondary | ICD-10-CM

## 2020-08-26 DIAGNOSIS — N2581 Secondary hyperparathyroidism of renal origin: Secondary | ICD-10-CM | POA: Diagnosis present

## 2020-08-26 DIAGNOSIS — E1129 Type 2 diabetes mellitus with other diabetic kidney complication: Secondary | ICD-10-CM | POA: Diagnosis present

## 2020-08-26 DIAGNOSIS — Z20822 Contact with and (suspected) exposure to covid-19: Secondary | ICD-10-CM | POA: Diagnosis present

## 2020-08-26 LAB — CBC WITH DIFFERENTIAL/PLATELET
Abs Immature Granulocytes: 0.02 10*3/uL (ref 0.00–0.07)
Basophils Absolute: 0 10*3/uL (ref 0.0–0.1)
Basophils Relative: 1 %
Eosinophils Absolute: 0.2 10*3/uL (ref 0.0–0.5)
Eosinophils Relative: 4 %
HCT: 27.4 % — ABNORMAL LOW (ref 39.0–52.0)
Hemoglobin: 8.4 g/dL — ABNORMAL LOW (ref 13.0–17.0)
Immature Granulocytes: 0 %
Lymphocytes Relative: 14 %
Lymphs Abs: 0.9 10*3/uL (ref 0.7–4.0)
MCH: 27.5 pg (ref 26.0–34.0)
MCHC: 30.7 g/dL (ref 30.0–36.0)
MCV: 89.5 fL (ref 80.0–100.0)
Monocytes Absolute: 0.7 10*3/uL (ref 0.1–1.0)
Monocytes Relative: 11 %
Neutro Abs: 4.5 10*3/uL (ref 1.7–7.7)
Neutrophils Relative %: 70 %
Platelets: 144 10*3/uL — ABNORMAL LOW (ref 150–400)
RBC: 3.06 MIL/uL — ABNORMAL LOW (ref 4.22–5.81)
RDW: 16 % — ABNORMAL HIGH (ref 11.5–15.5)
WBC: 6.3 10*3/uL (ref 4.0–10.5)
nRBC: 0 % (ref 0.0–0.2)

## 2020-08-26 LAB — COMPREHENSIVE METABOLIC PANEL
ALT: 16 U/L (ref 0–44)
AST: 11 U/L — ABNORMAL LOW (ref 15–41)
Albumin: 3.5 g/dL (ref 3.5–5.0)
Alkaline Phosphatase: 97 U/L (ref 38–126)
Anion gap: 15 (ref 5–15)
BUN: 74 mg/dL — ABNORMAL HIGH (ref 8–23)
CO2: 20 mmol/L — ABNORMAL LOW (ref 22–32)
Calcium: 10 mg/dL (ref 8.9–10.3)
Chloride: 107 mmol/L (ref 98–111)
Creatinine, Ser: 6.84 mg/dL — ABNORMAL HIGH (ref 0.61–1.24)
GFR calc Af Amer: 9 mL/min — ABNORMAL LOW (ref >60)
GFR calc non Af Amer: 8 mL/min — ABNORMAL LOW (ref >60)
Glucose, Bld: 130 mg/dL — ABNORMAL HIGH (ref 70–99)
Potassium: 4.7 mmol/L (ref 3.5–5.1)
Sodium: 142 mmol/L (ref 135–145)
Total Bilirubin: 0.7 mg/dL (ref 0.3–1.2)
Total Protein: 6.6 g/dL (ref 6.5–8.1)

## 2020-08-26 LAB — TROPONIN I (HIGH SENSITIVITY)
Troponin I (High Sensitivity): 23 ng/L — ABNORMAL HIGH (ref <18)
Troponin I (High Sensitivity): 23 ng/L — ABNORMAL HIGH (ref <18)

## 2020-08-26 NOTE — ED Triage Notes (Signed)
Pt presents with SOB that increases w/exertion and generalized fatigue. Pt reports his iron is low, was scheduled for an iron infusion today but was unable to get himself out of the truck to go inside [facility] for transfusion. Pt states, "my arms and legs feel like slinkys"

## 2020-08-27 ENCOUNTER — Encounter (HOSPITAL_COMMUNITY): Payer: Self-pay | Admitting: Internal Medicine

## 2020-08-27 ENCOUNTER — Emergency Department (HOSPITAL_COMMUNITY): Payer: Medicare HMO

## 2020-08-27 DIAGNOSIS — R1032 Left lower quadrant pain: Secondary | ICD-10-CM | POA: Diagnosis not present

## 2020-08-27 DIAGNOSIS — Z20822 Contact with and (suspected) exposure to covid-19: Secondary | ICD-10-CM | POA: Diagnosis present

## 2020-08-27 DIAGNOSIS — Z7984 Long term (current) use of oral hypoglycemic drugs: Secondary | ICD-10-CM | POA: Diagnosis not present

## 2020-08-27 DIAGNOSIS — E872 Acidosis: Secondary | ICD-10-CM | POA: Diagnosis present

## 2020-08-27 DIAGNOSIS — L299 Pruritus, unspecified: Secondary | ICD-10-CM | POA: Diagnosis present

## 2020-08-27 DIAGNOSIS — E1151 Type 2 diabetes mellitus with diabetic peripheral angiopathy without gangrene: Secondary | ICD-10-CM | POA: Diagnosis present

## 2020-08-27 DIAGNOSIS — I5040 Unspecified combined systolic (congestive) and diastolic (congestive) heart failure: Secondary | ICD-10-CM | POA: Diagnosis not present

## 2020-08-27 DIAGNOSIS — I252 Old myocardial infarction: Secondary | ICD-10-CM | POA: Diagnosis not present

## 2020-08-27 DIAGNOSIS — Z7982 Long term (current) use of aspirin: Secondary | ICD-10-CM | POA: Diagnosis not present

## 2020-08-27 DIAGNOSIS — E11649 Type 2 diabetes mellitus with hypoglycemia without coma: Secondary | ICD-10-CM | POA: Diagnosis not present

## 2020-08-27 DIAGNOSIS — Z79899 Other long term (current) drug therapy: Secondary | ICD-10-CM | POA: Diagnosis not present

## 2020-08-27 DIAGNOSIS — N186 End stage renal disease: Secondary | ICD-10-CM

## 2020-08-27 DIAGNOSIS — D509 Iron deficiency anemia, unspecified: Secondary | ICD-10-CM | POA: Diagnosis present

## 2020-08-27 DIAGNOSIS — N179 Acute kidney failure, unspecified: Secondary | ICD-10-CM | POA: Diagnosis present

## 2020-08-27 DIAGNOSIS — Z955 Presence of coronary angioplasty implant and graft: Secondary | ICD-10-CM | POA: Diagnosis not present

## 2020-08-27 DIAGNOSIS — I5042 Chronic combined systolic (congestive) and diastolic (congestive) heart failure: Secondary | ICD-10-CM | POA: Diagnosis present

## 2020-08-27 DIAGNOSIS — I2510 Atherosclerotic heart disease of native coronary artery without angina pectoris: Secondary | ICD-10-CM | POA: Diagnosis present

## 2020-08-27 DIAGNOSIS — Z87891 Personal history of nicotine dependence: Secondary | ICD-10-CM | POA: Diagnosis not present

## 2020-08-27 DIAGNOSIS — N2581 Secondary hyperparathyroidism of renal origin: Secondary | ICD-10-CM | POA: Diagnosis present

## 2020-08-27 DIAGNOSIS — Z951 Presence of aortocoronary bypass graft: Secondary | ICD-10-CM | POA: Diagnosis not present

## 2020-08-27 DIAGNOSIS — E785 Hyperlipidemia, unspecified: Secondary | ICD-10-CM | POA: Diagnosis present

## 2020-08-27 DIAGNOSIS — E1122 Type 2 diabetes mellitus with diabetic chronic kidney disease: Secondary | ICD-10-CM | POA: Diagnosis present

## 2020-08-27 DIAGNOSIS — I132 Hypertensive heart and chronic kidney disease with heart failure and with stage 5 chronic kidney disease, or end stage renal disease: Secondary | ICD-10-CM | POA: Diagnosis present

## 2020-08-27 HISTORY — DX: End stage renal disease: N18.6

## 2020-08-27 LAB — RENAL FUNCTION PANEL
Albumin: 3.6 g/dL (ref 3.5–5.0)
Anion gap: 15 (ref 5–15)
BUN: 72 mg/dL — ABNORMAL HIGH (ref 8–23)
CO2: 18 mmol/L — ABNORMAL LOW (ref 22–32)
Calcium: 10 mg/dL (ref 8.9–10.3)
Chloride: 109 mmol/L (ref 98–111)
Creatinine, Ser: 6.49 mg/dL — ABNORMAL HIGH (ref 0.61–1.24)
GFR calc non Af Amer: 8 mL/min — ABNORMAL LOW (ref >60)
Glucose, Bld: 85 mg/dL (ref 70–99)
Phosphorus: 7.5 mg/dL — ABNORMAL HIGH (ref 2.5–4.6)
Potassium: 4.5 mmol/L (ref 3.5–5.1)
Sodium: 142 mmol/L (ref 135–145)

## 2020-08-27 LAB — URINALYSIS, ROUTINE W REFLEX MICROSCOPIC
Bilirubin Urine: NEGATIVE
Glucose, UA: 50 mg/dL — AB
Ketones, ur: NEGATIVE mg/dL
Leukocytes,Ua: NEGATIVE
Nitrite: NEGATIVE
Protein, ur: 100 mg/dL — AB
Specific Gravity, Urine: 1.009 (ref 1.005–1.030)
pH: 6 (ref 5.0–8.0)

## 2020-08-27 LAB — BRAIN NATRIURETIC PEPTIDE: B Natriuretic Peptide: 2583.5 pg/mL — ABNORMAL HIGH (ref 0.0–100.0)

## 2020-08-27 LAB — HEMOGLOBIN A1C
Hgb A1c MFr Bld: 6.3 % — ABNORMAL HIGH (ref 4.8–5.6)
Mean Plasma Glucose: 134.11 mg/dL

## 2020-08-27 LAB — CBG MONITORING, ED
Glucose-Capillary: 89 mg/dL (ref 70–99)
Glucose-Capillary: 108 mg/dL — ABNORMAL HIGH (ref 70–99)
Glucose-Capillary: 134 mg/dL — ABNORMAL HIGH (ref 70–99)

## 2020-08-27 LAB — RESPIRATORY PANEL BY RT PCR (FLU A&B, COVID)
Influenza A by PCR: NEGATIVE
Influenza B by PCR: NEGATIVE
SARS Coronavirus 2 by RT PCR: NEGATIVE

## 2020-08-27 LAB — HEPATITIS B SURFACE ANTIGEN: Hepatitis B Surface Ag: NONREACTIVE

## 2020-08-27 LAB — HIV ANTIBODY (ROUTINE TESTING W REFLEX): HIV Screen 4th Generation wRfx: NONREACTIVE

## 2020-08-27 LAB — GLUCOSE, CAPILLARY
Glucose-Capillary: 164 mg/dL — ABNORMAL HIGH (ref 70–99)
Glucose-Capillary: 93 mg/dL (ref 70–99)

## 2020-08-27 MED ORDER — ROPINIROLE HCL 0.25 MG PO TABS
0.2500 mg | ORAL_TABLET | Freq: Every day | ORAL | Status: DC
  Administered 2020-08-27 – 2020-08-28 (×2): 0.25 mg via ORAL
  Filled 2020-08-27 (×3): qty 1

## 2020-08-27 MED ORDER — DARBEPOETIN ALFA 60 MCG/0.3ML IJ SOSY: 60.0000 ug | PREFILLED_SYRINGE | INTRAMUSCULAR | Status: DC

## 2020-08-27 MED ORDER — GABAPENTIN 100 MG PO CAPS
200.0000 mg | ORAL_CAPSULE | Freq: Every day | ORAL | Status: DC
  Administered 2020-08-27 – 2020-08-28 (×2): 200 mg via ORAL
  Filled 2020-08-27 (×2): qty 2

## 2020-08-27 MED ORDER — CHLORHEXIDINE GLUCONATE CLOTH 2 % EX PADS: 6.0000 | MEDICATED_PAD | Freq: Every day | CUTANEOUS | Status: DC

## 2020-08-27 MED ORDER — INSULIN ASPART 100 UNIT/ML ~~LOC~~ SOLN
0.0000 [IU] | Freq: Three times a day (TID) | SUBCUTANEOUS | Status: DC
  Administered 2020-08-27 – 2020-08-28 (×2): 1 [IU] via SUBCUTANEOUS
  Administered 2020-08-29: 2 [IU] via SUBCUTANEOUS

## 2020-08-27 MED ORDER — INSULIN ASPART 100 UNIT/ML ~~LOC~~ SOLN: 0.0000 [IU] | Freq: Every day | SUBCUTANEOUS | Status: DC

## 2020-08-27 MED ORDER — POLYETHYLENE GLYCOL 3350 17 G PO PACK: 17.0000 g | PACK | Freq: Every day | ORAL | Status: DC | PRN

## 2020-08-27 MED ORDER — ALBUTEROL SULFATE (2.5 MG/3ML) 0.083% IN NEBU: 3.0000 mL | INHALATION_SOLUTION | Freq: Four times a day (QID) | RESPIRATORY_TRACT | Status: DC | PRN

## 2020-08-27 MED ORDER — ATORVASTATIN CALCIUM 40 MG PO TABS
40.0000 mg | ORAL_TABLET | Freq: Every day | ORAL | Status: DC
  Administered 2020-08-27 – 2020-08-29 (×3): 40 mg via ORAL
  Filled 2020-08-27: qty 4
  Filled 2020-08-27 (×2): qty 1

## 2020-08-27 MED ORDER — INFLUENZA VAC A&B SA ADJ QUAD 0.5 ML IM PRSY
0.5000 mL | PREFILLED_SYRINGE | INTRAMUSCULAR | Status: DC | PRN
  Filled 2020-08-27: qty 0.5

## 2020-08-27 MED ORDER — SODIUM CHLORIDE 0.9 % IV SOLN
250.0000 mg | INTRAVENOUS | Status: DC
  Filled 2020-08-27: qty 20

## 2020-08-27 MED ORDER — CALCITRIOL 0.5 MCG PO CAPS
0.5000 ug | ORAL_CAPSULE | Freq: Every day | ORAL | Status: DC
  Administered 2020-08-28 – 2020-08-29 (×2): 0.5 ug via ORAL
  Filled 2020-08-27 (×4): qty 1

## 2020-08-27 MED ORDER — METOPROLOL SUCCINATE ER 50 MG PO TB24
50.0000 mg | ORAL_TABLET | Freq: Every day | ORAL | Status: DC
  Administered 2020-08-27 – 2020-08-29 (×3): 50 mg via ORAL
  Filled 2020-08-27: qty 1
  Filled 2020-08-27: qty 2
  Filled 2020-08-27: qty 1

## 2020-08-27 MED ORDER — ACETAMINOPHEN 650 MG RE SUPP: 650.0000 mg | Freq: Four times a day (QID) | RECTAL | Status: DC | PRN

## 2020-08-27 MED ORDER — HEPARIN SODIUM (PORCINE) 5000 UNIT/ML IJ SOLN
5000.0000 [IU] | Freq: Three times a day (TID) | INTRAMUSCULAR | Status: DC
  Administered 2020-08-27 – 2020-08-29 (×4): 5000 [IU] via SUBCUTANEOUS
  Filled 2020-08-27 (×5): qty 1

## 2020-08-27 MED ORDER — SEVELAMER CARBONATE 800 MG PO TABS
800.0000 mg | ORAL_TABLET | Freq: Three times a day (TID) | ORAL | Status: DC
  Administered 2020-08-28 – 2020-08-29 (×5): 800 mg via ORAL
  Filled 2020-08-27 (×8): qty 1

## 2020-08-27 MED ORDER — INSULIN GLARGINE 100 UNIT/ML ~~LOC~~ SOLN
10.0000 [IU] | Freq: Every day | SUBCUTANEOUS | Status: DC
  Administered 2020-08-27 – 2020-08-28 (×2): 10 [IU] via SUBCUTANEOUS
  Filled 2020-08-27 (×4): qty 0.1

## 2020-08-27 MED ORDER — ASPIRIN EC 81 MG PO TBEC
81.0000 mg | DELAYED_RELEASE_TABLET | Freq: Every day | ORAL | Status: DC
  Administered 2020-08-27 – 2020-08-29 (×3): 81 mg via ORAL
  Filled 2020-08-27 (×3): qty 1

## 2020-08-27 MED ORDER — ACETAMINOPHEN 325 MG PO TABS
650.0000 mg | ORAL_TABLET | Freq: Four times a day (QID) | ORAL | Status: DC | PRN
  Administered 2020-08-27 – 2020-08-29 (×3): 650 mg via ORAL
  Filled 2020-08-27 (×3): qty 2

## 2020-08-27 NOTE — H&P (Signed)
Date: 08/27/2020               Patient Name:  John [name] MRN: [id number]  DOB: Feb 10, 1951 Age / Sex: 69 y.o., male   PCP: Imagene Riches, NP         Medical Service: Internal Medicine Teaching Service         Attending Physician: Dr. Lucious Groves, DO    First Contact: Dr. [name] Pager: [phone]  Second Contact: Dr. Gilford Rile Pager: [phone]       After Hours (After 5p/  First Contact Pager: [phone]  weekends / holidays): Second Contact Pager: [phone]   Chief Complaint: Fatigue and dyspnea on exertion  History of Present Illness: 69 y.o. gentleman with past medical history of CKD5, HTN, type 2 diabetes, CAD status post PCI and four-vessel CABG (03/29/2019) PAD status post aortofemoral bypass, p/w shortness of breath and fatigue.  He has had worsening of shortness of breath with activity and fatigue in the past 2 months.  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  He denies any nausea vomiting. No lightheadedness. No loss of consciousness. He has had some mild lower extremity edema but it has not changed recently.  No orthopnea but he has some cough when he lay flat.  He denies any chest pain.  He reports compliance to his medication.  He follows with Dr. [name], nephrologist outpatient and has an AV fistula placed on his right arm but he was not a started on hemodialysis yet. He reports some discomfort/soreness at left flank that is started about 2 weeks ago.  No associated urinary symptoms and he states that he urinates very well.  No dysuria or frequency.  No hematuria.  He denies any fever or chills.   Meds:  Amlodipine, albuterol, aspirin, Lipitor, calcitriol, Trulicity 1.5 mg every Sunday, ferrous sulfate, gabapentin, glipizide 2.5 mg twice daily, hydralazine, Tradjenta 5 mg daily, metoprolol 50 mg daily, ropinirole  0.25 mg at bedtime, sevelamer, tamsulosin, torsemide 20 mg (patient takes 80 mg) daily, zolpidem  Current Meds  Medication Sig  . acetaminophen (TYLENOL) 500 MG tablet Take 1,500 mg by mouth 2 (two) times daily as needed (pain).  Marland Kitchen albuterol (VENTOLIN HFA) 108 (90 Base) MCG/ACT inhaler TAKE 2 PUFFS BY MOUTH EVERY 6 HOURS AS NEEDED FOR WHEEZE OR SHORTNESS OF BREATH (Patient taking differently: Inhale 2 puffs into the lungs every 6 (six) hours as needed for wheezing or shortness of breath. )  . amLODipine (NORVASC) 5 MG tablet Take 5 mg by mouth daily.  Marland Kitchen aspirin EC 81 MG tablet Take 81 mg by mouth daily.  Marland Kitchen atorvastatin (LIPITOR) 40 MG tablet Take 40 mg by mouth daily in the afternoon.   . calcitRIOL (ROCALTROL) 0.5 MCG capsule Take 0.5 mcg by mouth daily.  . Dulaglutide (TRULICITY) 1.5 KM/6.2MM SOPN Inject 1.5 mg into the skin every Sunday.  . ferrous sulfate 325 (65 FE) MG tablet Take 325 mg by mouth 2 (two) times daily with a meal.  . fluticasone (FLONASE) 50 MCG/ACT nasal spray Place 1 spray into both nostrils daily as needed for allergies or rhinitis.  Marland Kitchen gabapentin (NEURONTIN) 100 MG capsule Take 2 capsules (200 mg total) by mouth at bedtime.  Marland Kitchen glipiZIDE (GLUCOTROL XL) 2.5 MG 24 hr tablet Take 2.5 mg  by mouth 2 (two) times daily.  . hydrALAZINE (APRESOLINE) 10 MG tablet Take 1 tablet (10 mg total) by mouth 3 (three) times daily.  Marland Kitchen linagliptin (TRADJENTA) 5 MG TABS tablet Take 5 mg by mouth daily.  . metoprolol succinate (TOPROL-XL) 50 MG 24 hr tablet Take 50 mg by mouth daily.  Marland Kitchen OVER THE COUNTER MEDICATION Place 1 drop into both eyes 2 (two) times daily as needed (dry eyes). Hyper Tears eye drops  . rOPINIRole (REQUIP) 0.25 MG tablet Take 0.25 mg by mouth at bedtime.  . sevelamer carbonate (RENVELA) 800 MG tablet Take 1 tablet (800 mg total) by mouth 3 (three) times daily with meals.  . tamsulosin (FLOMAX) 0.4 MG CAPS capsule Take 0.4 mg by mouth daily.  Marland Kitchen torsemide (DEMADEX) 20 MG  tablet Take 1 tablet (20 mg total) by mouth daily. (Patient taking differently: Take 80 mg by mouth daily. )  . zolpidem (AMBIEN) 10 MG tablet Take 10 mg by mouth at bedtime.     Allergies: Allergies as of 08/26/2020 - Review Complete 08/26/2020  Allergen Reaction Noted  . Penicillins Rash and Other (See Comments) 09/06/2017   Past Medical History:  Diagnosis Date  . Arthritis   . Carotid artery stenosis    73-42% LICA, 8-76% RICA 06/23/14 [place]  . Coronary artery disease   . Diabetes mellitus without complication ([place])   . Dyspnea   . Hypertension   . MI (myocardial infarction) ([place])   . Renal disorder   . Thoracic aortic aneurysm (TAA) ([facility])    4.3 cm ascending TAA 02/02/20 CT    Family History:  Cancer in grandparents. (Unsure about cancer type) Alcohol use disorder in father.  Father passed away due to suicide.  Family History  Problem Relation Age of Onset  . Cancer Maternal Grandmother     Social History: Mr. Denherder lives with his wife in [place]. He is a Administrator. He quit smoking around 20 years ago. Rarely drinks alcohol. No illicit drug use.  Review of Systems: A complete ROS was negative except as per HPI.   Physical Exam: Blood pressure (!) 155/87, pulse 79, temperature 98 F (36.7 C), temperature source Oral, resp. rate 15, height 6' (1.829 m), weight 96.6 kg, SpO2 95 %. Physical Exam Constitutional:      Appearance: He is ill-appearing.     Comments: Appears tired.  Cardiovascular:     Rate and Rhythm: Normal rate and regular rhythm.     Pulses: Normal pulses.     Heart sounds: No murmur heard.   Pulmonary:     Effort: Pulmonary effort is normal. No respiratory distress.     Breath sounds: Examination of the right-lower field reveals rales. Examination of the left-lower field reveals rales. Rales present.  Abdominal:     Tenderness: There is abdominal tenderness. There is no guarding or rebound.     Comments: Mild tenderness at left lower and  lateral quadrant and flank.  No CVA angle tenderness.  No rebound tenderness.  Musculoskeletal:     Right lower leg: Edema present.     Left lower leg: Edema present.  Skin:    Findings: No rash.     Comments: Bruise   Neurological:     Mental Status: He is alert and oriented to person, place, and time.  Psychiatric:        Behavior: Behavior normal.    CBC Latest Ref Rng & Units 08/26/2020 05/06/2020 02/21/2020  WBC 4.0 - 10.5 K/uL 6.3  5.7 10.3  Hemoglobin 13.0 - 17.0 g/dL 8.4(L) 9.5(L) 10.4(L)  Hematocrit 39 - 52 % 27.4(L) 31.4(L) 34.5(L)  Platelets 150 - 400 K/uL 144(L) 169 186    CMP Latest Ref Rng & Units 08/26/2020 08/01/2020 07/24/2020  Glucose 70 - 99 mg/dL 130(H) 91 199(H)  BUN 8 - 23 mg/dL 74(H) 63(H) 58(H)  Creatinine 0.61 - 1.24 mg/dL 6.84(H) 5.46(H) 5.80(H)  Sodium 135 - 145 mmol/L 142 140 141  Potassium 3.5 - 5.1 mmol/L 4.7 5.0 5.3(H)  Chloride 98 - 111 mmol/L 107 105 105  CO2 22 - 32 mmol/L 20(L) 20 18(L)  Calcium 8.9 - 10.3 mg/dL 10.0 9.9 9.8  Total Protein 6.5 - 8.1 g/dL 6.6 - -  Total Bilirubin 0.3 - 1.2 mg/dL 0.7 - -  Alkaline Phos 38 - 126 U/L 97 - -  AST 15 - 41 U/L 11(L) - -  ALT 0 - 44 U/L 16 - -    EKG: Sinus (artifact in V1), prolonged QT (Corrected QT 459) No orders in ED except oxygen, EDP consulted nephro Chest x-ray 10/4 showed borderline cardiomegaly, vascular congestion and probable early pulmonary edema.  Small bilateral pleural effusion and mild volume overload  CT abd 10/5: A loop of small bowel extends into a ventral hernia immediately to the left of midline in the lower abdominal region without bowel compromise.   3.  Mild ascites.  5. Bilateral pleural effusions, larger on the right than on the left. Bibasilar atelectasis and scarring.   6. No evident renal or ureteral calculus. No hydronephrosis. Urinary bladder wall thickness normal. Incidental note made of multiple prostatic calculi.   Assessment & Plan by Problem: Active  Problems:   ESRD (end stage renal disease) ([place])   68 y.o. gentleman with past medical history of CKD 5, HTN, type 2 diabetes, CAD status post PCI and four-vessel CABG (03/29/2019) PAD status post aortofemoral bypass, p/w shortness of Breath and Fatigue.Vital sign including O2 saturation were stable on arrival.  Mild to moderate volume overload on exam (bibasilar crackles and 1+ lower extremity edema) chest x-ray showed some congestion and pleural effusion. BNP was elevated at 2583. CT scan of abdomen was obtained due to pain and mild tenderness of LLQ and left left. No acute abnormality reported.  Nephrology consulted and recommended starting HD this amdission. IMTS consulted for admission.   CKD5 now ESRD:  Gradual worsening of kidney function and progressive fatigue and DOE, and mild itching in past 2 months.  Mild to moderate volume overload on exam: Bibasilar crackles and 1+ lower extremity edema up to lower third of legs.  Blood work showed some worsening of kidney function. (Gradualy worsening over past few months) Creatinine of 6.8 (from 5.4, a months ago) and BUN of 74. Potassium stable at 4.7. Hco3 is borderline at 20-18. Marland Kitchen He has history of CKD stage V and being seen by Dr.Kruska, nephrologist outpatient, who was consulted and saw the patient here in hospital today.  No significant volume overload, significant uremia or electrolyte abnormality that requires urgent HD but given continuous symptoms including fatigue, and mild uremia symptoms, plan is to start patient on hemodialysis this hospitalization. Patient has a right side AV fistula that can be used. 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  -HD per nephrology. Appreciate nephrology recommendation -Renal panel today and then daily -Continue calcitriol and sevelamer.  -Received IV iron with HD  Flank pain:  Reports mild soreness/aching at  left lower quadrant and left flank. He  denies any urinary symptoms. On exam, he has mild tenderness on the left lower quadrant, and left flank. No CVA angle tenderness. Has small bruise on left flank, but it is minor and CT did not show any evidence of retroperitoneal bleeding. He does not remember any trauma or fall and mentions that he did not inject his insulin on that side. CT-scan of the abdomen w/o acute renal abnormality or retroperitoneal hematoma but showed hernia..  No significant finding on exam or CT scan that explain his mild flank discomfort. Ordering UA and UC. Will monitor the bruise on his flank for any changes or worsening while on pharmacologic VT prophylaxis.  -No further management -Follow-up UA and UC  DM2:  Home antiglycemic medications:   Trulicity (dulaglutide) 1.5 mg weekly (inject every Sunday), glipizide 2.5 mg twice daily, Tradjenta (linagliptin) 5 mg daily.  Likely need adjustment of home meds after discharge.  -HbA1c today 6.3 -Holding home meds -Ordering Lantus 10 units at bedtime and sensitive SSI (Be causios w insulin given ESRD) -CBG monitroing  CAD status post CABG: PAD Systolic &diastolic heart failure: Last echo 8/27, w EF 40 to 45% and grade  III DD HTN: Patient is on amlodipine 5 mg daily, hydralazine 10 mg 3 times daily, torsemide 80 mg daily at home, metoprolol 50 mg daily at home. Blood pressure at 140-160s over 90s today. -Continue home metoprolol  -Holding amlodipine and hydralazine and torsemide anticipating starting HD -Continue aspirin and Lipitor   Diet: Renal/heart healthy/carb modified IV fluid: None VTE ppx: Subcu heparin Code status: Full  Dispo: Admit patient to Inpatient with expected length of stay greater than 2 midnights.  SignedDewayne Hatch, MD 08/27/2020, 2:51 PM  Pager: [phone] After 5pm on weekdays and 1pm on weekends: On Call pager: [phone]

## 2020-08-27 NOTE — ED Notes (Signed)
Patient was given a cup of Coffee. 

## 2020-08-27 NOTE — Hospital Course (Addendum)
Hospital Course: Mr. Dealmeida presented to the [facility] on 08/26/20 with several weeks of progressive shortness of breath and fatigue. He was found to be clinically hypervolemic on exam. Labs showed an acute worsening of kidney function (Creatinine was 6.8, up from 5.4 a few months prior). He was diagnosed with acute renal failure and nephrology was consulted. Nephrology recommended initiating dialysis. Patient already had an AVF placed several months ago and HD was initiated in the hospital. There were some issues with canalization of the fistula at the first HD session, but it remained patent and usable and he was successfully dialyzed twice while in the hospital with moderate improvement in his symptoms. Our renal social workers arranged outpatient dialysis for him prior to discharge.   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  DM2: Patient has been on Trulicity, Tradjenta, and glipizide on an outpatient basis. The purpose of this regimen is to keep him off insulin per DOT regulations. - It is possible that his diabetic medication needs will change with him initiating dialysis. Recommend monitoring CBGs and adjusting accordingly.

## 2020-08-27 NOTE — Consult Note (Signed)
[facility]  INPATIENT CONSULTATION  Reason for Consultation: CKD 5 Requesting Provider: Dr. Heber Mammoth  HPI: John [name] is an 69 y.o. male with CAD s/p CABG, DM, HTN, CKD 5 who is seen for evaluation and management of CKD.    I follow him at [facility] clinic --> lately I've been concerned for low grade uremic symptoms and have been following closely.  He's been more anemic recently as well and iron deficiency noted so referred for IV iron which is scheduled for tomorrow at [facility].  He's having worsening fatigue, pruritis of back x 1 month, DOE and generally feeling dragged out.  Appetite "isn't what it used to be" but no frank dysgeusia.  No LUTs or edema.  No orthopnea or CP.    He is agreeable to starting dialysis.  PMH: Past Medical History:  Diagnosis Date  . Arthritis   . Carotid artery stenosis    81-44% LICA, 8-18% RICA 03/28/30 [place]  . Coronary artery disease   . Diabetes mellitus without complication ([place])   . Dyspnea   . Hypertension   . MI (myocardial infarction) ([place])   . Renal disorder   . Thoracic aortic aneurysm (TAA) (HCC)    4.3 cm ascending TAA 02/02/20 CT   PSH: Past Surgical History:  Procedure Laterality Date  . ABDOMINAL AORTIC ANEURYSM REPAIR    . APPENDECTOMY    . AV FISTULA PLACEMENT Right 10/18/2018   Procedure: BRACHIO-CEPHALIC ARTERIOVENOUS (AV) FISTULA CREATION;  Surgeon: Angelia Mould, MD;  Location: [place];  Service: Vascular;  Laterality: Right;  . CARDIAC CATHETERIZATION    . CORONARY ANGIOPLASTY    . CORONARY ARTERY BYPASS GRAFT N/A 03/29/2019   Procedure: CORONARY ARTERY BYPASS GRAFTING (CABG) x 4, using left internal mammary artery and right leg greater saphenous vein harvested endoscopically;  Surgeon: Melrose Nakayama, MD;  Location: [place];  Service: Open Heart Surgery;  Laterality: N/A;  . CORONARY PRESSURE WIRE/FFR WITH 3D MAPPING N/A 03/27/2019   Procedure: Coronary Pressure Wire/FFR w/3D Mapping;  Surgeon:  Jettie Booze, MD;  Location: [place] [facility];  Service: Cardiovascular;  Laterality: N/A;  . DECORTICATION Left 02/19/2020   Procedure: DECORTICATION;  Surgeon: Melrose Nakayama, MD;  Location: [place];  Service: Thoracic;  Laterality: Left;  . INTERCOSTAL NERVE BLOCK Left 02/19/2020   Procedure: Intercostal Nerve Block;  Surgeon: Melrose Nakayama, MD;  Location: [place];  Service: Thoracic;  Laterality: Left;  . IR FLUORO GUIDE CV LINE RIGHT  09/13/2017  . IR REMOVAL TUN CV CATH W/O FL  10/22/2017  . IR US GUIDE VASC ACCESS RIGHT  09/13/2017  . LEFT HEART CATH AND CORONARY ANGIOGRAPHY N/A 03/27/2019   Procedure: LEFT HEART CATH AND CORONARY ANGIOGRAPHY;  Surgeon: Jettie Booze, MD;  Location: [place] [facility];  Service: Cardiovascular;  Laterality: N/A;  . NECK SURGERY    . PLEURAL EFFUSION DRAINAGE Left 02/19/2020   Procedure: DRAINAGE OF PLEURAL EFFUSION;  Surgeon: Melrose Nakayama, MD;  Location: [place];  Service: Thoracic;  Laterality: Left;  . SMALL INTESTINE SURGERY    . TEE WITHOUT CARDIOVERSION N/A 09/10/2017   Procedure: TRANSESOPHAGEAL ECHOCARDIOGRAM (TEE);  Surgeon: Skeet Latch, MD;  Location: [place];  Service: Cardiovascular;  Laterality: N/A;  . TEE WITHOUT CARDIOVERSION N/A 03/29/2019   Procedure: TRANSESOPHAGEAL ECHOCARDIOGRAM (TEE);  Surgeon: Melrose Nakayama, MD;  Location: [place];  Service: Open Heart Surgery;  Laterality: N/A;  . TEE WITHOUT CARDIOVERSION N/A 01/12/2020  Procedure: TRANSESOPHAGEAL ECHOCARDIOGRAM (TEE);  Surgeon: Sanda Klein, MD;  Location: [place];  Service: Cardiovascular;  Laterality: N/A;  . VIDEO ASSISTED THORACOSCOPY Left 02/19/2020   Procedure: VIDEO ASSISTED THORACOSCOPY;  Surgeon: Melrose Nakayama, MD;  Location: [place];  Service: Thoracic;  Laterality: Left;  Marland Kitchen VIDEO BRONCHOSCOPY N/A 02/19/2020   Procedure: VIDEO BRONCHOSCOPY;  Surgeon: Melrose Nakayama, MD;  Location: [place];  Service:  Thoracic;  Laterality: N/A;     Past Medical History:  Diagnosis Date  . Arthritis   . Carotid artery stenosis    41-28% LICA, 7-86% RICA 05/29/71 [place]  . Coronary artery disease   . Diabetes mellitus without complication ([place])   . Dyspnea   . Hypertension   . MI (myocardial infarction) ([place])   . Renal disorder   . Thoracic aortic aneurysm (TAA) (HCC)    4.3 cm ascending TAA 02/02/20 CT    Medications:  I have reviewed the patient's current medications.  (Not in a hospital admission)   ALLERGIES:   Allergies  Allergen Reactions  . Penicillins Rash and Other (See Comments)    Did it involve swelling of the face/tongue/throat, SOB, or low BP? No Did it involve sudden or severe rash/hives, skin peeling, or any reaction on the inside of your mouth or nose? Yes Did you need to seek medical attention at a hospital or doctor's office? Yes When did it last happen?Childhood allergy If all above answers are "NO", may proceed with cephalosporin use.     FAM HX: Family History  Problem Relation Age of Onset  . Cancer Maternal Grandmother     Social History:   reports that he quit smoking about 23 years ago. His smoking use included cigarettes. He started smoking about 53 years ago. He has a 60.00 pack-year smoking history. He has never used smokeless tobacco. He reports current alcohol use. He reports that he does not use drugs.  ROS: 12 system ROS per HPI above  Blood pressure (!) 148/85, pulse 80, temperature 98 F (36.7 C), temperature source Oral, resp. rate 19, height 6' (1.829 m), weight 96.6 kg, SpO2 93 %. PHYSICAL EXAM: Gen: tired but nontoxic appearing  Eyes:  anicteric ENT:MMM Neck: no JVD CV:  RRR, no rub Abd:  Soft, nontender Lungs: a few basilar rales, normal WOB at rest GU: no foley Extr:  Trace ankle edema Neuro: slower to speak than typical but fully conversational Skin: no rashes   Results for orders placed or performed during the hospital  encounter of 08/26/20 (from the past 48 hour(s))  CBC with Differential     Status: Abnormal   Collection Time: 08/26/20  5:16 PM  Result Value Ref Range   WBC 6.3 4.0 - 10.5 K/uL   RBC 3.06 (L) 4.22 - 5.81 MIL/uL   Hemoglobin 8.4 (L) 13.0 - 17.0 g/dL   HCT 27.4 (L) 39 - 52 %   MCV 89.5 80.0 - 100.0 fL   MCH 27.5 26.0 - 34.0 pg   MCHC 30.7 30.0 - 36.0 g/dL   RDW 16.0 (H) 11.5 - 15.5 %   Platelets 144 (L) 150 - 400 K/uL   nRBC 0.0 0.0 - 0.2 %   Neutrophils Relative % 70 %   Neutro Abs 4.5 1.7 - 7.7 K/uL   Lymphocytes Relative 14 %   Lymphs Abs 0.9 0.7 - 4.0 K/uL   Monocytes Relative 11 %   Monocytes Absolute 0.7 0 - 1 K/uL   Eosinophils Relative 4 %  Eosinophils Absolute 0.2 0 - 0 K/uL   Basophils Relative 1 %   Basophils Absolute 0.0 0 - 0 K/uL   Immature Granulocytes 0 %   Abs Immature Granulocytes 0.02 0.00 - 0.07 K/uL    Comment: Performed at [facility], [place] [address], [place], [place] [address]  Comprehensive metabolic panel     Status: Abnormal   Collection Time: 08/26/20  5:16 PM  Result Value Ref Range   Sodium 142 135 - 145 mmol/L   Potassium 4.7 3.5 - 5.1 mmol/L   Chloride 107 98 - 111 mmol/L   CO2 20 (L) 22 - 32 mmol/L   Glucose, Bld 130 (H) 70 - 99 mg/dL    Comment: Glucose reference range applies only to samples taken after fasting for at least 8 hours.   BUN 74 (H) 8 - 23 mg/dL   Creatinine, Ser 6.84 (H) 0.61 - 1.24 mg/dL   Calcium 10.0 8.9 - 10.3 mg/dL   Total Protein 6.6 6.5 - 8.1 g/dL   Albumin 3.5 3.5 - 5.0 g/dL   AST 11 (L) 15 - 41 U/L   ALT 16 0 - 44 U/L   Alkaline Phosphatase 97 38 - 126 U/L   Total Bilirubin 0.7 0.3 - 1.2 mg/dL   GFR calc non Af Amer 8 (L) >60 mL/min   GFR calc Af Amer 9 (L) >60 mL/min   Anion gap 15 5 - 15    Comment: Performed at [facility] [address] [place], [place] [address]  Troponin I (High Sensitivity)     Status: Abnormal   Collection Time: 08/26/20  5:16 PM  Result Value Ref Range   Troponin I  (High Sensitivity) 23 (H) <18 ng/L    Comment: (NOTE) Elevated high sensitivity troponin I (hsTnI) values and significant  changes across serial measurements may suggest ACS but many other  chronic and acute conditions are known to elevate hsTnI results.  Refer to the "Links" section for chest pain algorithms and additional  guidance. Performed at [facility], [place] [address], [place], [place] [address]   Brain natriuretic peptide     Status: Abnormal   Collection Time: 08/26/20  5:16 PM  Result Value Ref Range   B Natriuretic Peptide 2,583.5 (H) 0.0 - 100.0 pg/mL    Comment: Performed at [facility] [address], [place], [place] [address]  Troponin I (High Sensitivity)     Status: Abnormal   Collection Time: 08/26/20  9:00 PM  Result Value Ref Range   Troponin I (High Sensitivity) 23 (H) <18 ng/L    Comment: (NOTE) Elevated high sensitivity troponin I (hsTnI) values and significant  changes across serial measurements may suggest ACS but many other  chronic and acute conditions are known to elevate hsTnI results.  Refer to the "Links" section for chest pain algorithms and additional  guidance. Performed at [facility], [place] [address] [place],  [address]   CBG monitoring, ED     Status: None   Collection Time: 08/27/20  3:57 AM  Result Value Ref Range   Glucose-Capillary 89 70 - 99 mg/dL    Comment: Glucose reference range applies only to samples taken after fasting for at least 8 hours.  CBG monitoring, ED     Status: Abnormal   Collection Time: 08/27/20  8:27 AM  Result Value Ref Range   Glucose-Capillary 108 (H) 70 - 99 mg/dL    Comment: Glucose  reference range applies only to samples taken after fasting for at least 8 hours.    CT ABDOMEN PELVIS WO CONTRAST  Result Date: 08/27/2020 CLINICAL DATA:  Abdominal pain, primarily left-sided EXAM: CT ABDOMEN AND PELVIS WITHOUT CONTRAST TECHNIQUE: Multidetector CT imaging of the abdomen and pelvis  was performed following the standard protocol without IV contrast. COMPARISON:  January 14, 2009 FINDINGS: Lower chest: There are pleural effusions bilaterally, slightly larger on the right than on the left. There is atelectatic change and scarring in the lung bases. There are foci of coronary artery calcification. Hepatobiliary: No focal liver lesions are appreciable on this noncontrast enhanced study. Gallbladder wall does not appear appreciably thickened. There is no biliary duct dilatation. Pancreas: There is no pancreatic mass or inflammatory focus. Spleen: No splenic lesions are evident. Adrenals/Urinary Tract: Adrenals bilaterally appear normal. There is an extrarenal pelvis on the left, an anatomic variant. There is no appreciable renal mass or hydronephrosis on either side. There is no renal or ureteral calculus on either side. Urinary bladder is midline with wall thickness within normal limits. Stomach/Bowel: There is moderate stool throughout the colon. There is no bowel wall thickening or bowel obstruction. A loop of jejunum extends into a ventral hernia immediately to the left of midline without apparent bowel compromise at the site of this hernia. There are occasional ascending colonic diverticula without diverticulitis. Terminal ileum appears normal. No free air or portal venous air is evident. Vascular/Lymphatic: There is extensive aortic atherosclerosis throughout the abdominal and pelvic vessels. There is evidence of an aortobifemoral graft, similar in appearance to the prior study from 2010. Abdominal aortic diameter measures 4.3 x 4.1 cm, stable from the 2010 study. No periaortic or perivascular fluid. No adenopathy evident in the abdomen or pelvis. Reproductive: There are calcifications within the prostate. Prostate and seminal vesicles are normal in size and contour. Other: Appendix absent. No periappendiceal region inflammation. Ventral hernia containing loop of small bowel immediately to the  left of midline. Neck of this hernia measures 2.0 cm from right to left dimension and 1.4 cm from superior to inferior dimension. No evident abscess in the abdomen or pelvis. There is mild ascites adjacent to the liver. Musculoskeletal: No blastic or lytic bone lesions. There are multiple foci of degenerative change in the lumbar spine as well as degenerative type change in each sacroiliac joint. No intramuscular lesions are evident. IMPRESSION: 1. A loop of small bowel extends into a ventral hernia immediately to the left of midline in the lower abdominal region without bowel compromise. 2. No findings indicative of bowel obstruction. No abscess in the abdomen or pelvis. Appendix absent. 3.  Mild ascites. 4. Previous aortobifemoral grafting, stable appearance compared to 2010 study. Abdominal aorta has a current measured diameter 4.3 x 4.1 cm, stable. Recommend follow-up every 12 months and vascular consultation. This recommendation follows ACR consensus guidelines: White Paper of the ACR Incidental Findings Committee II on Vascular Findings. J Am Coll Radiol 2013; 10:789-794. multifocal coronary artery and pelvic arterial vascular calcification noted. 5. Bilateral pleural effusions, larger on the right than on the left. Bibasilar atelectasis and scarring. 6. No evident renal or ureteral calculus. No hydronephrosis. Urinary bladder wall thickness normal. Incidental note made of multiple prostatic calculi. Electronically Signed   By: Lowella Grip III M.D.   On: 08/27/2020 09:36   DG Chest 2 View  Result Date: 08/26/2020 CLINICAL DATA:  Shortness of breath.  Fatigue. EXAM: CHEST - 2 VIEW COMPARISON:  Most recent radiograph 03/12/2020  FINDINGS: Post median sternotomy and CABG. Upper normal heart size. Unchanged mediastinal contours with aortic atherosclerosis. Minimal pleural thickening/small effusion at the left lung base, similar or slightly improved from prior exam. There is a small right pleural effusion.  Vascular congestion with question of early pulmonary edema. No pneumothorax. Surgical hardware in the lower cervical spine is partially included. No acute osseous abnormalities are seen. IMPRESSION: Borderline cardiomegaly with vascular congestion and question of early pulmonary edema. Small bilateral pleural effusions. Findings suggest mild fluid overload. Aortic Atherosclerosis (ICD10-I70.0). Electronically Signed   By: Keith Rake M.D.   On: 08/26/2020 17:44    Assessment/Plan **CKD 5 now ESRD:  Developing uremic symptoms, discussed initiation of dialysis and he's agreeable.  Has useable AVF.  1st today. Refer for CLIP.    **Anemia: iron deficiency noted in outpt setting.  IV iron here.  Likely will give ESA prior to d/c after IV iron load  **HTN:  Cont home meds.  Follow with [facility].  **Secondary hyperPTH: cont calcitriol, check phos here. Cont sevelmer 1 TIDAC  **Metabolic acidosis:  Mild, follow with HD  Justin Mend 08/27/2020, 11:31 AM

## 2020-08-27 NOTE — ED Provider Notes (Signed)
[facility] Emergency Department Provider Note MRN:  [id number]  Arrival date & time: 08/27/20     Chief Complaint   Shortness of Breath and Fatigue   History of Present Illness   John [name] is a 69 y.o. year-old male with a history of CABG, diabetes, hypertension presenting to the ED with chief complaint of shortness of breath.  Worsening dyspnea on exertion for the past 1 or 2 months.  Endorsing left flank and left lower quadrant pain for the past 2 weeks.  Getting more and more fatigued, explaining that his arms and legs feel like "slinky's".  Denies chest pain, no headache, no vision change, no numbness or weakness to the arms or legs.  Symptoms constant, worse with ambulation.  Review of Systems  A complete 10 system review of systems was obtained and all systems are negative except as noted in the HPI and PMH.   Patient's Health History    Past Medical History:  Diagnosis Date  . Arthritis   . Carotid artery stenosis    85-27% LICA, 7-82% RICA 02/22/34 [place]  . Coronary artery disease   . Diabetes mellitus without complication ([place])   . Dyspnea   . Hypertension   . MI (myocardial infarction) ([place])   . Renal disorder   . Thoracic aortic aneurysm (TAA) (HCC)    4.3 cm ascending TAA 02/02/20 CT    Past Surgical History:  Procedure Laterality Date  . ABDOMINAL AORTIC ANEURYSM REPAIR    . APPENDECTOMY    . AV FISTULA PLACEMENT Right 10/18/2018   Procedure: BRACHIO-CEPHALIC ARTERIOVENOUS (AV) FISTULA CREATION;  Surgeon: Angelia Mould, MD;  Location: [place];  Service: Vascular;  Laterality: Right;  . CARDIAC CATHETERIZATION    . CORONARY ANGIOPLASTY    . CORONARY ARTERY BYPASS GRAFT N/A 03/29/2019   Procedure: CORONARY ARTERY BYPASS GRAFTING (CABG) x 4, using left internal mammary artery and right leg greater saphenous vein harvested endoscopically;  Surgeon: Melrose Nakayama, MD;  Location: [place];  Service: Open Heart Surgery;   Laterality: N/A;  . CORONARY PRESSURE WIRE/FFR WITH 3D MAPPING N/A 03/27/2019   Procedure: Coronary Pressure Wire/FFR w/3D Mapping;  Surgeon: Jettie Booze, MD;  Location: [place] [facility];  Service: Cardiovascular;  Laterality: N/A;  . DECORTICATION Left 02/19/2020   Procedure: DECORTICATION;  Surgeon: Melrose Nakayama, MD;  Location: [place];  Service: Thoracic;  Laterality: Left;  . INTERCOSTAL NERVE BLOCK Left 02/19/2020   Procedure: Intercostal Nerve Block;  Surgeon: Melrose Nakayama, MD;  Location: [place];  Service: Thoracic;  Laterality: Left;  . IR FLUORO GUIDE CV LINE RIGHT  09/13/2017  . IR REMOVAL TUN CV CATH W/O FL  10/22/2017  . IR US GUIDE VASC ACCESS RIGHT  09/13/2017  . LEFT HEART CATH AND CORONARY ANGIOGRAPHY N/A 03/27/2019   Procedure: LEFT HEART CATH AND CORONARY ANGIOGRAPHY;  Surgeon: Jettie Booze, MD;  Location: [place] [facility];  Service: Cardiovascular;  Laterality: N/A;  . NECK SURGERY    . PLEURAL EFFUSION DRAINAGE Left 02/19/2020   Procedure: DRAINAGE OF PLEURAL EFFUSION;  Surgeon: Melrose Nakayama, MD;  Location: [place];  Service: Thoracic;  Laterality: Left;  . SMALL INTESTINE SURGERY    . TEE WITHOUT CARDIOVERSION N/A 09/10/2017   Procedure: TRANSESOPHAGEAL ECHOCARDIOGRAM (TEE);  Surgeon: Skeet Latch, MD;  Location: [place];  Service: Cardiovascular;  Laterality: N/A;  . TEE WITHOUT CARDIOVERSION N/A 03/29/2019   Procedure: TRANSESOPHAGEAL ECHOCARDIOGRAM (TEE);  Surgeon: Roxan Hockey,  Revonda Standard, MD;  Location: [place];  Service: Open Heart Surgery;  Laterality: N/A;  . TEE WITHOUT CARDIOVERSION N/A 01/12/2020   Procedure: TRANSESOPHAGEAL ECHOCARDIOGRAM (TEE);  Surgeon: Sanda Klein, MD;  Location: [place];  Service: Cardiovascular;  Laterality: N/A;  . VIDEO ASSISTED THORACOSCOPY Left 02/19/2020   Procedure: VIDEO ASSISTED THORACOSCOPY;  Surgeon: Melrose Nakayama, MD;  Location: [place];  Service: Thoracic;  Laterality: Left;   Marland Kitchen VIDEO BRONCHOSCOPY N/A 02/19/2020   Procedure: VIDEO BRONCHOSCOPY;  Surgeon: Melrose Nakayama, MD;  Location: [facility];  Service: Thoracic;  Laterality: N/A;    Family History  Problem Relation Age of Onset  . Cancer Maternal Grandmother     Social History   Socioeconomic History  . Marital status: Married    Spouse name: Not on file  . Number of children: Not on file  . Years of education: Not on file  . Highest education level: Not on file  Occupational History  . Not on file  Tobacco Use  . Smoking status: Former Smoker    Packs/day: 2.00    Years: 30.00    Pack years: 60.00    Types: Cigarettes    Start date: 1968    Quit date: 1998    Years since quitting: 23.7  . Smokeless tobacco: Never Used  Vaping Use  . Vaping Use: Never used  Substance and Sexual Activity  . Alcohol use: Yes    Comment: Occasionally.  . Drug use: Never  . Sexual activity: Not on file  Other Topics Concern  . Not on file  Social History Narrative  . Not on file   Social Determinants of Health   Financial Resource Strain:   . Difficulty of Paying Living Expenses: Not on file  Food Insecurity:   . Worried About Charity fundraiser in the Last Year: Not on file  . Ran Out of Food in the Last Year: Not on file  Transportation Needs:   . Lack of Transportation (Medical): Not on file  . Lack of Transportation (Non-Medical): Not on file  Physical Activity:   . Days of Exercise per Week: Not on file  . Minutes of Exercise per Session: Not on file  Stress:   . Feeling of Stress : Not on file  Social Connections:   . Frequency of Communication with Friends and Family: Not on file  . Frequency of Social Gatherings with Friends and Family: Not on file  . Attends Religious Services: Not on file  . Active Member of Clubs or Organizations: Not on file  . Attends Archivist Meetings: Not on file  . Marital Status: Not on file  Intimate Partner Violence:   . Fear of Current or  Ex-Partner: Not on file  . Emotionally Abused: Not on file  . Physically Abused: Not on file  . Sexually Abused: Not on file     Physical Exam   Vitals:   08/27/20 0950 08/27/20 1003  BP:  (!) 148/85  Pulse: 80 80  Resp: 20 19  Temp:    SpO2: 95% 93%    CONSTITUTIONAL: Chronically ill-appearing, NAD NEURO:  Alert and oriented x 3, normal and symmetric strength and sensation, normal coordination, normal speech EYES:  eyes equal and reactive ENT/NECK:  no LAD, no JVD CARDIO: Regular rate, well-perfused, normal S1 and S2 PULM:  CTAB no wheezing or rhonchi GI/GU:  normal bowel sounds, non-distended, non-tender MSK/SPINE:  No gross deformities, no edema SKIN:  no rash, atraumatic  PSYCH:  Appropriate speech and behavior  *Additional and/or pertinent findings included in MDM below  Diagnostic and Interventional Summary    EKG Interpretation  Date/Time:  Monday August 26 2020 17:09:13 EDT Ventricular Rate:  79 PR Interval:  184 QRS Duration: 98 QT Interval:  410 QTC Calculation: 470 R Axis:   -10 Text Interpretation: Normal sinus rhythm Nonspecific T wave abnormality Prolonged QT Abnormal ECG No significant change was found Confirmed by Gerlene Fee [phone]) on 08/27/2020 9:33:58 AM      Labs Reviewed  CBC WITH DIFFERENTIAL/PLATELET - Abnormal; Notable for the following components:      Result Value   RBC 3.06 (*)    Hemoglobin 8.4 (*)    HCT 27.4 (*)    RDW 16.0 (*)    Platelets 144 (*)    All other components within normal limits  COMPREHENSIVE METABOLIC PANEL - Abnormal; Notable for the following components:   CO2 20 (*)    Glucose, Bld 130 (*)    BUN 74 (*)    Creatinine, Ser 6.84 (*)    AST 11 (*)    GFR calc non Af Amer 8 (*)    GFR calc Af Amer 9 (*)    All other components within normal limits  BRAIN NATRIURETIC PEPTIDE - Abnormal; Notable for the following components:   B Natriuretic Peptide 2,583.5 (*)    All other components within normal limits   CBG MONITORING, ED - Abnormal; Notable for the following components:   Glucose-Capillary 108 (*)    All other components within normal limits  TROPONIN I (HIGH SENSITIVITY) - Abnormal; Notable for the following components:   Troponin I (High Sensitivity) 23 (*)    All other components within normal limits  TROPONIN I (HIGH SENSITIVITY) - Abnormal; Notable for the following components:   Troponin I (High Sensitivity) 23 (*)    All other components within normal limits  CBG MONITORING, ED    CT ABDOMEN PELVIS WO CONTRAST  Final Result    DG Chest 2 View  Final Result      Medications - No data to display   Procedures  /  Critical Care .Critical Care Performed by: Maudie Flakes, MD Authorized by: Maudie Flakes, MD   Critical care provider statement:    Critical care time (minutes):  35   Critical care was necessary to treat or prevent imminent or life-threatening deterioration of the following conditions:  Metabolic crisis (Renal failure)   Critical care was time spent personally by me on the following activities:  Discussions with consultants, evaluation of patient's response to treatment, examination of patient, ordering and performing treatments and interventions, ordering and review of laboratory studies, ordering and review of radiographic studies, pulse oximetry, re-evaluation of patient's condition, obtaining history from patient or surrogate and review of old charts    ED Course and Medical Decision Making  I have reviewed the triage vital signs, the nursing notes, and pertinent available records from the EMR.  Listed above are laboratory and imaging tests that I personally ordered, reviewed, and interpreted and then considered in my medical decision making (see below for details).  Malaise, fatigue, dyspnea on exertion, labs revealing worsening anemia and worsening kidney function.  Patient is near end-stage CKD but is not yet started on dialysis.  Consulted Dr.  [name], who will evaluate patient but thinks that maybe patient needs to be started on dialysis.  Anticipating admission.  Given the worsening kidney function and flank pain, will  CT to evaluate the kidney.     CT without emergent process, labs supporting fluid overloaded state in the setting of renal failure, will admit to medicine.  Barth Kirks. [place] Small, [place] [email or web address]  Final Clinical Impressions(s) / ED Diagnoses     ICD-10-CM   1. Acute renal failure superimposed on stage 5 chronic kidney disease, not on chronic dialysis, unspecified acute renal failure type ([place])  N17.9    N18.5     ED Discharge Orders    None       Discharge Instructions Discussed with and Provided to Patient:   Discharge Instructions   None       Maudie Flakes, MD 08/27/20 1036

## 2020-08-27 NOTE — ED Notes (Signed)
Pt transported to CT ?

## 2020-08-27 NOTE — Progress Notes (Signed)
Renal Navigator received notification from Dr. Arlyss Gandy that patient needs referral to [facility] clinic for OP HD treatment for ESRD. Patient follows with Dr. [name] at [facility] and lives closest to the [place] clinic.  Navigator submitted referral, pending HepB labs, to [facility] and will follow closely.  Alphonzo Cruise, [place] Renal Navigator [phone]

## 2020-08-28 DIAGNOSIS — I132 Hypertensive heart and chronic kidney disease with heart failure and with stage 5 chronic kidney disease, or end stage renal disease: Secondary | ICD-10-CM | POA: Diagnosis not present

## 2020-08-28 DIAGNOSIS — E1122 Type 2 diabetes mellitus with diabetic chronic kidney disease: Secondary | ICD-10-CM

## 2020-08-28 DIAGNOSIS — I739 Peripheral vascular disease, unspecified: Secondary | ICD-10-CM

## 2020-08-28 DIAGNOSIS — I5040 Unspecified combined systolic (congestive) and diastolic (congestive) heart failure: Secondary | ICD-10-CM | POA: Diagnosis not present

## 2020-08-28 DIAGNOSIS — Z794 Long term (current) use of insulin: Secondary | ICD-10-CM

## 2020-08-28 DIAGNOSIS — R1032 Left lower quadrant pain: Secondary | ICD-10-CM

## 2020-08-28 DIAGNOSIS — I2510 Atherosclerotic heart disease of native coronary artery without angina pectoris: Secondary | ICD-10-CM

## 2020-08-28 DIAGNOSIS — N186 End stage renal disease: Secondary | ICD-10-CM | POA: Diagnosis not present

## 2020-08-28 DIAGNOSIS — Z951 Presence of aortocoronary bypass graft: Secondary | ICD-10-CM

## 2020-08-28 DIAGNOSIS — E785 Hyperlipidemia, unspecified: Secondary | ICD-10-CM

## 2020-08-28 LAB — CBC
HCT: 25.8 % — ABNORMAL LOW (ref 39.0–52.0)
Hemoglobin: 7.7 g/dL — ABNORMAL LOW (ref 13.0–17.0)
MCH: 26.5 pg (ref 26.0–34.0)
MCHC: 29.8 g/dL — ABNORMAL LOW (ref 30.0–36.0)
MCV: 88.7 fL (ref 80.0–100.0)
Platelets: 138 10*3/uL — ABNORMAL LOW (ref 150–400)
RBC: 2.91 MIL/uL — ABNORMAL LOW (ref 4.22–5.81)
RDW: 16.2 % — ABNORMAL HIGH (ref 11.5–15.5)
WBC: 5.1 10*3/uL (ref 4.0–10.5)
nRBC: 0 % (ref 0.0–0.2)

## 2020-08-28 LAB — GLUCOSE, CAPILLARY
Glucose-Capillary: 108 mg/dL — ABNORMAL HIGH (ref 70–99)
Glucose-Capillary: 144 mg/dL — ABNORMAL HIGH (ref 70–99)
Glucose-Capillary: 107 mg/dL — ABNORMAL HIGH (ref 70–99)
Glucose-Capillary: 98 mg/dL (ref 70–99)
Glucose-Capillary: 119 mg/dL — ABNORMAL HIGH (ref 70–99)

## 2020-08-28 LAB — URINE CULTURE: Culture: <10000 — AB

## 2020-08-28 LAB — RENAL FUNCTION PANEL
Albumin: 3.1 g/dL — ABNORMAL LOW (ref 3.5–5.0)
Anion gap: 15 (ref 5–15)
BUN: 51 mg/dL — ABNORMAL HIGH (ref 8–23)
CO2: 21 mmol/L — ABNORMAL LOW (ref 22–32)
Calcium: 9.5 mg/dL (ref 8.9–10.3)
Chloride: 107 mmol/L (ref 98–111)
Creatinine, Ser: 5.03 mg/dL — ABNORMAL HIGH (ref 0.61–1.24)
GFR calc non Af Amer: 11 mL/min — ABNORMAL LOW (ref >60)
Glucose, Bld: 100 mg/dL — ABNORMAL HIGH (ref 70–99)
Phosphorus: 5.9 mg/dL — ABNORMAL HIGH (ref 2.5–4.6)
Potassium: 4.5 mmol/L (ref 3.5–5.1)
Sodium: 143 mmol/L (ref 135–145)

## 2020-08-28 LAB — MRSA PCR SCREENING: MRSA by PCR: NEGATIVE

## 2020-08-28 MED ORDER — FLUTICASONE PROPIONATE 50 MCG/ACT NA SUSP
2.0000 | Freq: Every day | NASAL | Status: DC
  Administered 2020-08-28: 2 via NASAL
  Filled 2020-08-28: qty 16

## 2020-08-28 MED ORDER — SODIUM CHLORIDE 0.9 % IV SOLN: 100.0000 mL | INTRAVENOUS | Status: DC | PRN

## 2020-08-28 MED ORDER — HEPARIN SODIUM (PORCINE) 1000 UNIT/ML DIALYSIS: 1000.0000 [IU] | INTRAMUSCULAR | Status: DC | PRN

## 2020-08-28 MED ORDER — PENTAFLUOROPROP-TETRAFLUOROETH EX AERO: 1.0000 "application " | INHALATION_SPRAY | CUTANEOUS | Status: DC | PRN

## 2020-08-28 MED ORDER — LIDOCAINE HCL (PF) 1 % IJ SOLN: 5.0000 mL | INTRAMUSCULAR | Status: DC | PRN

## 2020-08-28 MED ORDER — ALTEPLASE 2 MG IJ SOLR: 2.0000 mg | Freq: Once | INTRAMUSCULAR | Status: DC | PRN

## 2020-08-28 MED ORDER — ZOLPIDEM TARTRATE 5 MG PO TABS
10.0000 mg | ORAL_TABLET | Freq: Every evening | ORAL | Status: DC | PRN
  Administered 2020-08-28: 10 mg via ORAL
  Filled 2020-08-28: qty 2

## 2020-08-28 MED ORDER — LIDOCAINE-PRILOCAINE 2.5-2.5 % EX CREA: 1.0000 "application " | TOPICAL_CREAM | CUTANEOUS | Status: DC | PRN

## 2020-08-28 MED ORDER — ZOLPIDEM TARTRATE 5 MG PO TABS
5.0000 mg | ORAL_TABLET | Freq: Every evening | ORAL | Status: DC | PRN
  Filled 2020-08-28: qty 1

## 2020-08-28 MED ORDER — ZOLPIDEM TARTRATE 5 MG PO TABS: 10.0000 mg | ORAL_TABLET | Freq: Every evening | ORAL | Status: DC | PRN

## 2020-08-28 NOTE — Progress Notes (Signed)
HepB Surface Antigen lab result faxed to [facility] Admissions to complete referral. Navigator will follow closely.  Alphonzo Cruise, [place] Renal Navigator [phone]

## 2020-08-28 NOTE — Progress Notes (Signed)
[facility] Progress Note   Subjective:   HD last PM - issues with cannulation of AVF but eventually ran ok at Low BFR.  Less pruritis otherwise feels the same.   Objective Vitals:   08/28/20 0000 08/28/20 0105 08/28/20 0404 08/28/20 0753  BP:   138/88 139/78  Pulse:   74 69  Resp:   16 18  Temp:   98.3 F (36.8 C) 97.9 F (36.6 C)  TempSrc:   Oral Oral  SpO2:   96% 95%  Weight: 92.3 kg 92.3 kg    Height:       Physical Exam General: appears comfortable Heart: RRR, no rub Lungs: clear, ^ WOB moving around room Abdomen: soft Extremities: no edema Dialysis Access:  RUE AVF +t/b  Additional Objective Labs: Basic Metabolic Panel: Recent Labs  Lab 08/26/20 1716 08/27/20 1324  NA 142 142  K 4.7 4.5  CL 107 109  CO2 20* 18*  GLUCOSE 130* 85  BUN 74* 72*  CREATININE 6.84* 6.49*  CALCIUM 10.0 10.0  PHOS  --  7.5*   Liver Function Tests: Recent Labs  Lab 08/26/20 1716 08/27/20 1324  AST 11*  --   ALT 16  --   ALKPHOS 97  --   BILITOT 0.7  --   PROT 6.6  --   ALBUMIN 3.5 3.6   No results for input(s): LIPASE, AMYLASE in the last 168 hours. CBC: Recent Labs  Lab 08/26/20 1716 08/28/20 0717  WBC 6.3 5.1  NEUTROABS 4.5  --   HGB 8.4* 7.7*  HCT 27.4* 25.8*  MCV 89.5 88.7  PLT 144* 138*   Blood Culture    Component Value Date/Time   SDES TISSUE LEFT LUNG 02/19/2020 1203   SPECREQUEST SPEC B 02/19/2020 1203   CULT  02/19/2020 1203    No growth aerobically or anaerobically. Performed at [facility], [place] [address], [place], [place] [address]    REPTSTATUS 02/24/2020 FINAL 02/19/2020 1203    Cardiac Enzymes: No results for input(s): CKTOTAL, CKMB, CKMBINDEX, TROPONINI in the last 168 hours. CBG: Recent Labs  Lab 08/27/20 0827 08/27/20 1715 08/27/20 1842 08/27/20 2336 08/28/20 0600  GLUCAP 108* 134* 164* 93 144*   Iron Studies: No results for input(s): IRON, TIBC, TRANSFERRIN, FERRITIN in the last 72  hours. @lablastinr3 @ Studies/Results: CT ABDOMEN PELVIS WO CONTRAST  Result Date: 08/27/2020 CLINICAL DATA:  Abdominal pain, primarily left-sided EXAM: CT ABDOMEN AND PELVIS WITHOUT CONTRAST TECHNIQUE: Multidetector CT imaging of the abdomen and pelvis was performed following the standard protocol without IV contrast. COMPARISON:  January 14, 2009 FINDINGS: Lower chest: There are pleural effusions bilaterally, slightly larger on the right than on the left. There is atelectatic change and scarring in the lung bases. There are foci of coronary artery calcification. Hepatobiliary: No focal liver lesions are appreciable on this noncontrast enhanced study. Gallbladder wall does not appear appreciably thickened. There is no biliary duct dilatation. Pancreas: There is no pancreatic mass or inflammatory focus. Spleen: No splenic lesions are evident. Adrenals/Urinary Tract: Adrenals bilaterally appear normal. There is an extrarenal pelvis on the left, an anatomic variant. There is no appreciable renal mass or hydronephrosis on either side. There is no renal or ureteral calculus on either side. Urinary bladder is midline with wall thickness within normal limits. Stomach/Bowel: There is moderate stool throughout the colon. There is no bowel wall thickening or bowel obstruction. A loop of jejunum extends into a ventral hernia immediately to the left of midline  without apparent bowel compromise at the site of this hernia. There are occasional ascending colonic diverticula without diverticulitis. Terminal ileum appears normal. No free air or portal venous air is evident. Vascular/Lymphatic: There is extensive aortic atherosclerosis throughout the abdominal and pelvic vessels. There is evidence of an aortobifemoral graft, similar in appearance to the prior study from 2010. Abdominal aortic diameter measures 4.3 x 4.1 cm, stable from the 2010 study. No periaortic or perivascular fluid. No adenopathy evident in the abdomen or  pelvis. Reproductive: There are calcifications within the prostate. Prostate and seminal vesicles are normal in size and contour. Other: Appendix absent. No periappendiceal region inflammation. Ventral hernia containing loop of small bowel immediately to the left of midline. Neck of this hernia measures 2.0 cm from right to left dimension and 1.4 cm from superior to inferior dimension. No evident abscess in the abdomen or pelvis. There is mild ascites adjacent to the liver. Musculoskeletal: No blastic or lytic bone lesions. There are multiple foci of degenerative change in the lumbar spine as well as degenerative type change in each sacroiliac joint. No intramuscular lesions are evident. IMPRESSION: 1. A loop of small bowel extends into a ventral hernia immediately to the left of midline in the lower abdominal region without bowel compromise. 2. No findings indicative of bowel obstruction. No abscess in the abdomen or pelvis. Appendix absent. 3.  Mild ascites. 4. Previous aortobifemoral grafting, stable appearance compared to 2010 study. Abdominal aorta has a current measured diameter 4.3 x 4.1 cm, stable. Recommend follow-up every 12 months and vascular consultation. This recommendation follows ACR consensus guidelines: White Paper of the ACR Incidental Findings Committee II on Vascular Findings. J Am Coll Radiol 2013; 10:789-794. multifocal coronary artery and pelvic arterial vascular calcification noted. 5. Bilateral pleural effusions, larger on the right than on the left. Bibasilar atelectasis and scarring. 6. No evident renal or ureteral calculus. No hydronephrosis. Urinary bladder wall thickness normal. Incidental note made of multiple prostatic calculi. Electronically Signed   By: Lowella Grip III M.D.   On: 08/27/2020 09:36   DG Chest 2 View  Result Date: 08/26/2020 CLINICAL DATA:  Shortness of breath.  Fatigue. EXAM: CHEST - 2 VIEW COMPARISON:  Most recent radiograph 03/12/2020 FINDINGS: Post  median sternotomy and CABG. Upper normal heart size. Unchanged mediastinal contours with aortic atherosclerosis. Minimal pleural thickening/small effusion at the left lung base, similar or slightly improved from prior exam. There is a small right pleural effusion. Vascular congestion with question of early pulmonary edema. No pneumothorax. Surgical hardware in the lower cervical spine is partially included. No acute osseous abnormalities are seen. IMPRESSION: Borderline cardiomegaly with vascular congestion and question of early pulmonary edema. Small bilateral pleural effusions. Findings suggest mild fluid overload. Aortic Atherosclerosis (ICD10-I70.0). Electronically Signed   By: Keith Rake M.D.   On: 08/26/2020 17:44   Medications: . ferric gluconate (FERRLECIT/NULECIT) IV Stopped (08/27/20 1611)   . aspirin EC  81 mg Oral Daily  . atorvastatin  40 mg Oral Q1500  . calcitRIOL  0.5 mcg Oral Daily  . [START ON 09/03/2020] darbepoetin (ARANESP) injection - DIALYSIS  60 mcg Intravenous Q Tue-HD  . gabapentin  200 mg Oral QHS  . heparin  5,000 Units Subcutaneous Q8H  . insulin aspart  0-5 Units Subcutaneous QHS  . insulin aspart  0-9 Units Subcutaneous TID WC  . insulin glargine  10 Units Subcutaneous QHS  . metoprolol succinate  50 mg Oral Daily  . rOPINIRole  0.25 mg Oral QHS  .  sevelamer carbonate  800 mg Oral TID WC   Assessment/Plan **CKD 5 now ESRD:  Developing uremic symptoms, initiating HD.  Has useable AVF but with some difficulties using yesterday - if persistant issues will call [facility].  2nd HD today. Referred for CLIP.    **Anemia: iron deficiency noted in outpt setting.  IV iron here.  Aranesp initiated.  **HTN:  Cont home meds.  Follow with [facility].  **Secondary hyperPTH: cont calcitriol, check phos here. Cont sevelmer 1 TIDAC  **Metabolic acidosis:  Mild, follow with HD   **DM: per primary  Jannifer Hick MD 08/28/2020, 8:33 AM  [facility] Pager: [phone]

## 2020-08-28 NOTE — Progress Notes (Signed)
PT Cancellation Note  Patient Details Name: JUNAID WURZER MRN: [id number] DOB: [birth year]   Cancelled Treatment:    Reason Eval/Treat Not Completed: PT screened, no needs identified, will sign off (Per OT, pt does not need PT.Sign off. )   Denice Paradise 08/28/2020, 1:06 PM Erynn Vaca W,PT Acute Rehabilitation Services Pager:  [phone]  Office:  [phone]

## 2020-08-28 NOTE — Evaluation (Signed)
Occupational Therapy Evaluation Patient Details Name: John [name] MRN: [id number] DOB: 10/07/1951 Today's Date: 08/28/2020    History of Present Illness 69 y.o. gentleman with past medical history of CKD5, HTN, type 2 diabetes, CAD status post PCI and four-vessel CABG (03/29/2019) PAD status post aortofemoral bypass, p/w shortness of breath and fatigue.  He has had worsening of shortness of breath with activity and fatigue in the past 2 months.Mild to moderate volume overload on exam (bibasilar crackles and 1+ lower extremity edema) chest x-ray showed some congestion and pleural effusion. BNP was elevated at 2583. CT scan of abdomen was obtained due to pain and mild tenderness of LLQ and left left. No acute abnormality reported.  Nephrology consulted and recommended starting HD.   Clinical Impression   Patient admitted with the above diagnosis.  Mild dizziness with supine to stand, resolved quickly.  HGB 7.7.  Patient educated to pause before walking to bathroom to ensure safety.  Patient verbalized understanding.  He is presenting at or near baseline function.  No acute OT needs identified.  Patient encouraged to walk the halls with nursing.  He is up ad-lib in his room.  No post acute OT needs identified.  Recommended shower chair at home.  Patient verbalized understanding.       Follow Up Recommendations  No OT follow up    Equipment Recommendations  Tub/shower seat    Recommendations for Other Services       Precautions / Restrictions Precautions Precautions: Fall Restrictions Weight Bearing Restrictions: No      Mobility Bed Mobility Overal bed mobility: Independent                Transfers Overall transfer level: Independent               General transfer comment: mild dizziness noted with initial stand.  HGB 7.7, starting Iron Supplement    Balance Overall balance assessment: Independent                                         ADL  either performed or assessed with clinical judgement   ADL Overall ADL's : At baseline                                             Vision Baseline Vision/History: Wears glasses Wears Glasses: At all times Patient Visual Report: No change from baseline       Perception     Praxis      Pertinent Vitals/Pain Pain Assessment: Faces Pain Score: 1  Pain Descriptors / Indicators: Sore Pain Intervention(s): Repositioned     Hand Dominance Right   Extremity/Trunk Assessment Upper Extremity Assessment Upper Extremity Assessment: Overall WFL for tasks assessed   Lower Extremity Assessment Lower Extremity Assessment: Defer to PT evaluation   Cervical / Trunk Assessment Cervical / Trunk Assessment: Kyphotic   Communication Communication Communication: No difficulties   Cognition Arousal/Alertness: Awake/alert Behavior During Therapy: WFL for tasks assessed/performed Overall Cognitive Status: Within Functional Limits for tasks assessed  Home Living Family/patient expects to be discharged to:: Private residence Living Arrangements: Spouse/significant other Available Help at Discharge: Available PRN/intermittently Type of Home: House Home Access: Stairs to enter [employer] of Steps: 3 - back [place]: One level     Bathroom Shower/Tub: Teacher, early years/pre: Standard     Home Equipment: None          Prior Functioning/Environment Level of Independence: Independent                 OT Problem List: Increased edema      OT Treatment/Interventions:      OT Goals(Current goals can be found in the care plan section) Acute Rehab OT Goals Patient Stated Goal: Return home OT Goal Formulation: With patient Time For Goal Achievement: 09/02/20 Potential to Achieve Goals: Good  OT Frequency:     Barriers to D/C: Decreased caregiver  support.  Patient's spouse with anxiety, depression and suicidal tendencies per patient.            Co-evaluation              AM-PAC OT "6 Clicks" Daily Activity     Outcome Measure Help from another person eating meals?: None Help from another person taking care of personal grooming?: None Help from another person toileting, which includes using toliet, bedpan, or urinal?: None Help from another person bathing (including washing, rinsing, drying)?: None Help from another person to put on and taking off regular upper body clothing?: None Help from another person to put on and taking off regular lower body clothing?: None 6 Click Score: 24   End of Session Nurse Communication: Other (comment) (No OT needs)  Activity Tolerance: Patient tolerated treatment well Patient left: in bed;with call bell/phone within reach  OT Visit Diagnosis: Dizziness and giddiness (R42)                Time: 5747-3403 OT Time Calculation (min): 36 min Charges:  OT Evaluation $OT Eval Low Complexity: 1 Low  08/28/2020  [name], OTR/L  Acute Rehabilitation Services  Office:  [phone]   [name] 08/28/2020, 11:18 AM

## 2020-08-28 NOTE — Progress Notes (Signed)
Renal Navigator met with patient at HD bedside to introduce self and discuss outpatient HD referral. Patient was very pleasant and talkative. He seems very well prepared for starting HD and states he had his fistula placed "years ago" in preparation. He states his wife, 3 step-daughters and 2 adopted daughters (twins who chose him as their "Lillia Abed" from across the street) are great supports for him. Patient has transportation until he feels comfortable driving again. 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 He states his trips are local and that he "gave up long distance a long time ago." He understands the importance of HD compliance and states he knows where the clinic is-not far from his home.  Navigator awaiting seat schedule from outpatient clinic/[place] via [facility]missions. Navigator will continue to follow closely.  Alphonzo Cruise, [place] Renal Navigator [phone]

## 2020-08-28 NOTE — Progress Notes (Signed)
Rounded on patient today in correlation to transition to outpatient HD. Ordered consult to dietician and Kidney Failure Book. Patient educated at the bedside regarding AV fistula/graft site care and proper medication administration on HD days.  The importance of limiting foods that are high in potassium and Phosphorus. Patient capable of verbalizing via teach back method. Educated patient on services are available to them through the interdisciplinary team in the clinic setting. Patient with no further questions at this time. Handouts and contact information provided to patient for any further assistance. Will follow as appropriate.   Dorthey Sawyer, RN  Dialysis Nurse Coordinator Phone: [phone]

## 2020-08-28 NOTE — Progress Notes (Signed)
Subjective:  John [name] states that he's feeling "a little better" this morning. His fatigue and shortness of breath are both improved and his itchiness is completely resolved. He reports no discomfort of complaints with dialysis yesterday. He understands that there were issues with canalization of his fistula yesterday and is amenable to further dialysis today. It seems that they were unable to remove any fluid during yesterday's session. He is eating, drinking, and urinating without issue at this time. 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   Objective:  Vital signs in last 24 hours: Vitals:   08/28/20 1310 08/28/20 1320 08/28/20 1335 08/28/20 1350  BP: (!) 142/80 (!) 148/79 133/73 135/72  Pulse:      Resp:  16 15 16   Temp:      TempSrc:      SpO2:      Weight:      Height:       Weight change: -0.416 kg  Intake/Output Summary (Last 24 hours) at 08/28/2020 1420 Last data filed at 08/28/2020 0445 Gross per 24 hour  Intake 480 ml  Output 950 ml  Net -470 ml   Physical Exam Vitals and nursing note reviewed.  Cardiovascular:     Rate and Rhythm: Normal rate and regular rhythm.     Heart sounds: Normal heart sounds.     Comments: JVP elevated to angle of the mandible.  Pulmonary:     Comments: Crackles at lung bases bilaterally.  Abdominal:     Comments: Distended. + fluid wave. non-tender. Normal bowel sounds.      Assessment/Plan:  Active Problems:   DM (diabetes mellitus), type 2 with renal complications (HCC)   Essential hypertension   ESRD (end stage renal disease) ([place])  ESRD: Gradual worsening of kidney function and progressive fatigue and DOE, mild itching for two months. Volume overload on exam. Bibasilar crackles and JVP to angle of the mandible. S/p first dialysis session, but with no fluid removed. Cr 6.7-->5.0 (seems to be his baseline).  BUN 74-->51. K stable at 4.5.  - Scheduled for dialysis again today. - Daily renal function panel.  Flank pain:  Reports mild soreness/aching at left lower quadrant and left flank. He denies any urinary symptoms. On exam, he has mild tenderness on the left lower quadrant, and left flank. No CVA angle tenderness. Has small bruise on left flank, but it is minor and CT did not show any evidence of retroperitoneal bleeding. UA with mild glucosuria and proteinuria consistent with his DM2 and ESRD. Rare bacteria seen. Urine culture pending. He does not remember any trauma or fall and mentions that he did not inject his insulin on that side. CT-scan of the abdomen w/o acute renal abnormality or retroperitoneal hematoma but showed hernia.  -No further management  DM2:  On Trulicity and Tradjenta at home. A1c 6.3. - Holding home meds. - Lantus 10 units at bedtime - Sensitive SSI  - CBG monitoring   Systolic and diastolic HF (EF 99-83%; Grade III DD): HTN: Home meds include metoprolol 50mg  daily, amlodipine 5mg  daily, hydral 10mg  TID, torsemide 80 mg daily. Holding home amlodipine, hydral, and torsemide while patient initiates HD. Maintained strong pressures in first session yesterday.  - Metoprolol 50mg  daily. - Continue to hold other home meds and assess dialysis BPs.  CAD s/p CABG: PAD: HLD: - Continue home ASA and  Lipitor   LOS: 1 day   Pearla Dubonnet, Medical Student 08/28/2020, 2:20 PM

## 2020-08-29 DIAGNOSIS — R1032 Left lower quadrant pain: Secondary | ICD-10-CM | POA: Diagnosis not present

## 2020-08-29 DIAGNOSIS — I5040 Unspecified combined systolic (congestive) and diastolic (congestive) heart failure: Secondary | ICD-10-CM | POA: Diagnosis not present

## 2020-08-29 DIAGNOSIS — I132 Hypertensive heart and chronic kidney disease with heart failure and with stage 5 chronic kidney disease, or end stage renal disease: Secondary | ICD-10-CM | POA: Diagnosis not present

## 2020-08-29 DIAGNOSIS — N186 End stage renal disease: Secondary | ICD-10-CM | POA: Diagnosis not present

## 2020-08-29 LAB — RENAL FUNCTION PANEL
Albumin: 3.2 g/dL — ABNORMAL LOW (ref 3.5–5.0)
Anion gap: 11 (ref 5–15)
BUN: 35 mg/dL — ABNORMAL HIGH (ref 8–23)
CO2: 24 mmol/L (ref 22–32)
Calcium: 9.7 mg/dL (ref 8.9–10.3)
Chloride: 104 mmol/L (ref 98–111)
Creatinine, Ser: 4.37 mg/dL — ABNORMAL HIGH (ref 0.61–1.24)
GFR calc non Af Amer: 13 mL/min — ABNORMAL LOW (ref >60)
Glucose, Bld: 72 mg/dL (ref 70–99)
Phosphorus: 5.1 mg/dL — ABNORMAL HIGH (ref 2.5–4.6)
Potassium: 4.5 mmol/L (ref 3.5–5.1)
Sodium: 139 mmol/L (ref 135–145)

## 2020-08-29 LAB — CBC WITH DIFFERENTIAL/PLATELET
Abs Immature Granulocytes: 0.01 10*3/uL (ref 0.00–0.07)
Basophils Absolute: 0 10*3/uL (ref 0.0–0.1)
Basophils Relative: 1 %
Eosinophils Absolute: 0.4 10*3/uL (ref 0.0–0.5)
Eosinophils Relative: 7 %
HCT: 28.2 % — ABNORMAL LOW (ref 39.0–52.0)
Hemoglobin: 8.4 g/dL — ABNORMAL LOW (ref 13.0–17.0)
Immature Granulocytes: 0 %
Lymphocytes Relative: 19 %
Lymphs Abs: 1.1 10*3/uL (ref 0.7–4.0)
MCH: 27 pg (ref 26.0–34.0)
MCHC: 29.8 g/dL — ABNORMAL LOW (ref 30.0–36.0)
MCV: 90.7 fL (ref 80.0–100.0)
Monocytes Absolute: 0.7 10*3/uL (ref 0.1–1.0)
Monocytes Relative: 13 %
Neutro Abs: 3.5 10*3/uL (ref 1.7–7.7)
Neutrophils Relative %: 60 %
Platelets: 117 10*3/uL — ABNORMAL LOW (ref 150–400)
RBC: 3.11 MIL/uL — ABNORMAL LOW (ref 4.22–5.81)
RDW: 15.9 % — ABNORMAL HIGH (ref 11.5–15.5)
WBC: 5.7 10*3/uL (ref 4.0–10.5)
nRBC: 0 % (ref 0.0–0.2)

## 2020-08-29 LAB — GLUCOSE, CAPILLARY
Glucose-Capillary: 69 mg/dL — ABNORMAL LOW (ref 70–99)
Glucose-Capillary: 90 mg/dL (ref 70–99)
Glucose-Capillary: 172 mg/dL — ABNORMAL HIGH (ref 70–99)

## 2020-08-29 LAB — HEPATITIS B SURFACE ANTIBODY, QUANTITATIVE: Hep B S AB Quant (Post): <3.1 m[IU]/mL — ABNORMAL LOW (ref >9.9)

## 2020-08-29 NOTE — Discharge Instructions (Addendum)
Please hold torsemide at this time until you follow up with nephrology.     End-Stage Kidney Disease End-stage kidney disease occurs when the kidneys are so damaged that they cannot function and cannot get better. This condition may also be referred to as end-stage renal disease or ESRD. The kidneys are two organs that do many important jobs in the body, including:  Removing wastes and extra fluids from the blood.  Making hormones that maintain the amount of fluid in your tissues and blood vessels.  Maintaining the right amount of fluids and chemicals in the body. Without functioning kidneys, toxins build up in the blood and life-threatening complications can occur. What are the causes? This condition usually occurs when a long-term (chronic) kidney disease gets worse and results in permanent damage to the kidneys. It may also be caused by sudden damage to the kidneys (acute kidney injury). Causes of this condition include:  Having a family history of chronic kidney disease (CKD).  Having chronic kidney disease for many years.  Chronic medical conditions that affect the kidneys, such as: ? Cardiovascular disease, including high blood pressure. ? Diabetes. ? Certain diseases that affect the body's disease-fighting (immune) system.  Overuse of over-the-counter pain medicines.  Being around or being in contact with poisonous (toxic) substances. What increases the risk? The following factors may make you more likely to develop this condition:  Being older than 60.  Being male.  Being of African-American, Asian, Native American, [place], or Hispanic descent.  Smoking or a history of smoking.  Obesity. What are the signs or symptoms? Symptoms of this condition include:  Swelling (edema) of the face, legs, ankles, or feet.  Numbness, tingling, or loss of feeling in the hands or feet.  Tiredness (lethargy).  Nausea or vomiting.  Confusion, trouble concentrating,  or loss of consciousness.  Chest pain.  Shortness of breath.  Passing little or no urine.  Muscle twitches and cramps, especially in the legs.  Dry, itchy skin.  Loss of appetite.  Pale skin due to anemia, including the skin and tissue around the eye (conjunctiva).  Headaches.  Abnormally dark or light skin.  Decrease in muscle size (muscle wasting).  Easy bruising.  Frequent hiccups.  Stopping of the monthly period in women.  Jerky movements (seizures). How is this diagnosed? This condition may be diagnosed based on:  A physical exam, including blood pressure measurements.  Urine tests.  Blood tests.  Imaging tests.  A test in which a sample of tissue is removed from the kidneys to be examined under a microscope (kidney biopsy). How is this treated? This condition may be treated with:  A procedure that removes toxic wastes from the body (dialysis). There are two types of dialysis: ? Dialysis that is done through your abdomen (peritoneal dialysis). This may be done several times a day. ? Dialysis that is done by a machine (hemodialysis). This may be done several times a week.  Surgery to receive a new kidney (kidney transplant). In addition to having dialysis or a kidney transplant, you may need to take medicines:  To control high blood pressure (hypertension).  To control high cholesterol.  To treat diabetes.  To maintain healthy levels of minerals in the blood (electrolytes). You may also be given a specific meal plan to follow that includes requirements or limits for:  Salt (sodium).  Protein.  Phosphorous.  Potassium.  Calcium. Follow these instructions at home: Medicines  Take over-the-counter and prescription medicines only as told by  your health care provider.  Do not take any new medicines, vitamins, or mineral supplements unless approved by your health care provider. Many medicines and supplements can worsen kidney damage.  Follow  instructions from your health care provider about adjusting the doses of any medicines you take. Lifestyle  Do not use any products that contain nicotine or tobacco, such as cigarettes and e-cigarettes. If you need help quitting, ask your health care provider.  Achieve and maintain a healthy weight. If you need help with this, ask your health care provider.  Start or continue an exercise plan. Exercise at least 30 minutes a day, 5 days a week.  Follow your prescribed meal plan. General instructions  Stay current with your shots (immunizations) as told by your health care provider.  Keep track of your blood pressure. Report changes in your blood pressure as told by your health care provider.  If you are being treated for diabetes, monitor and track your blood sugar (blood glucose) levels as told by your health care provider.  Keep all follow-up visits as told by your health care provider. This is important. Where to find more information  American Association of Kidney Patients: BombTimer.gl  National Kidney Foundation: www.kidney.[place]: https://mathis.com/  [facility]: www.lifeoptions.org and www.kidneyschool.org Contact a health care provider if:  Your symptoms get worse.  You develop new symptoms. Get help right away if:  You have weakness in an arm or leg on one side of your body.  You have difficulty speaking or you are slurring your speech.  You have a sudden change in your vision.  You have a sudden, severe headache.  You have a sudden weight increase.  You have difficulty breathing.  Your symptoms suddenly get worse. Summary  End-stage kidney disease occurs when the kidneys are so damaged that they cannot function and cannot get better.  Without functioning kidneys, toxins build up in the blood and life-threatening complications can occur.  Treatment may include dialysis or a kidney transplant along with medicines  and lifestyle changes. This information is not intended to replace advice given to you by your health care provider. Make sure you discuss any questions you have with your health care provider. Document Revised: 10/22/2017 Document Reviewed: 12/15/2016 Elsevier Patient Education  2020 [employer].

## 2020-08-29 NOTE — Progress Notes (Signed)
   Subjective:  John [name] states that he feels "okay" this morning but "still kinda bleh." He says that this is somewhat different from the fatigue that he came in with, but he attributes his current feeling to "laying in bed for a few days" and being away from home and work. His itchiness is resolved. He has an appointment for outpatient HD on Saturday and states he will have no issue honoring this appointment. He is eager to return to work. He recognizes that he still has some fluid on his lungs and abdomen and is hopeful that they will be able to begin pulling some fluid off of him at HD soon. He is amenable to going home.  Objective:  Vital signs in last 24 hours: Vitals:   08/28/20 2003 08/29/20 0338 08/29/20 0739 08/29/20 1114  BP: 132/80 127/61 136/68 135/70  Pulse: 75 69 79 75  Resp: 18 16 16 18   Temp: 98 F (36.7 C) 97.8 F (36.6 C) 98.1 F (36.7 C) 98.1 F (36.7 C)  TempSrc: Oral Oral Oral Oral  SpO2: 100% 95% 98% 98%  Weight:  93.5 kg    Height:       Weight change: -2 kg  Intake/Output Summary (Last 24 hours) at 08/29/2020 1124 Last data filed at 08/29/2020 1034 Gross per 24 hour  Intake 840 ml  Output 800 ml  Net 40 ml   Physical Exam Vitals and nursing note reviewed.  Constitutional:      General: He is not in acute distress. Cardiovascular:     Rate and Rhythm: Normal rate and regular rhythm.     Heart sounds: Normal heart sounds. No murmur heard.  No friction rub. No gallop.      Comments: JVP to the angle of the mandible Pulmonary:     Effort: Pulmonary effort is normal.  Abdominal:     Comments: Abdomen distended, non-tender, with + fluid wave.   Neurological:     Mental Status: He is alert.     Assessment/Plan:  Active Problems:   DM (diabetes mellitus), type 2 with renal complications (HCC)   Essential hypertension   ESRD (end stage renal disease) ([place])  ESRD on HD Tues-Thurs-Sat: Gradual worsening of kidney function and progressive  fatigue and DOE, mild itching for two months. Remains volume overload on exam. Abdominal fluid wave and JVP to angle of the mandible. S/p two dialysis sessions, but with no fluid removed. Cr 6.7-->5.0-->4.4. K stable at 4.5. Has an appointment for outpatient HD on Sat.  - Stable for discharge. - Outpatient HD on Saturday  DM2:  On Trulicity and Tradjenta at home. A1c 6.3. Episode of mild hypoglycemia to 69 this morning after having 10 units Lantus last night.  - Discharging on home meds  Systolic and diastolic HF (EF 26-71%; Grade III DD): HTN: Home meds include metoprolol 50mg  daily, amlodipine 5mg  daily, hydral 10mg  TID, torsemide 80 mg daily. Holding home amlodipine, hydral, and torsemide while patient initiates HD. Maintained strong pressures in first two HD sessions.  - Discharge on home meds.   CAD s/p CABG: PAD: HLD: - Continue home ASA and Lipitor    LOS: 2 days   Pearla Dubonnet, Medical Student 08/29/2020, 11:24 AM

## 2020-08-29 NOTE — Plan of Care (Signed)
Nutrition Education Note  RD consulted for Renal Education. Provided Renal Food Guide Pyramid to patient/family. Reviewed food groups and provided written recommended serving sizes specifically determined for patient's current nutritional status.   Explained why diet restrictions are needed and provided lists of foods to limit/avoid that are high in potassium, sodium, and phosphorus. Provided specific recommendations on safer alternatives of these foods. Strongly encouraged compliance of this diet.   Discussed importance of protein intake at each meal and snack. Provided examples of how to maximize protein intake throughout the day. Discussed need for fluid restriction with dialysis, importance of minimizing weight gain between HD treatments, and renal-friendly beverage options.  Encouraged pt to discuss specific diet questions/concerns with RD at HD outpatient facility. Teach back method used.  Expect fair compliance.  Body mass index is 27.97 kg/m. Pt meets criteria for overweight based on current BMI.  Current diet order is heart healthy/carb modified, patient is consuming approximately 100% of meals at this time. Labs and medications reviewed. No further nutrition interventions warranted at this time. RD contact information provided. If additional nutrition issues arise, please re-consult RD.  John Ina, MS, RD, LDN RD pager number and weekend/on-call pager number located in [place].

## 2020-08-29 NOTE — Progress Notes (Signed)
Patient has been accepted at [facility] for outpatient HD treatment for ESRD on a TTS schedule with a seat time of 9:15am. He needs to arrive to his treatments at 8:55am. Dr. Kruska/Nephrologist states patient is ready for discharge today, if cleared by Primary team, and therefore, patient will start in the clinic on Saturday. In order to have his first treatment in the clinic on Saturday, 08/31/20, patient needs to go to the clinic tomorrow, Friday, 08/30/20 to complete intake paperwork. I will instruct him to call in the morning to arrange a time.  Please contact Renal Navigator with any questions. Primary team has been notified of plan.  John [name], [place] Renal Navigator [phone]

## 2020-08-29 NOTE — Progress Notes (Signed)
D/C instructions given and reviewed. No questions asked but encouraged to call with any concerns. Tele and IV removed. Tolerated well.

## 2020-08-29 NOTE — Progress Notes (Signed)
Hypoglycemic Event  CBG: 69  Treatment: 8oz Juice  Symptoms: None  Follow-up CBG: OFBP:1025 CBG Result:90  Possible Reasons for Event:   Comments/MD notified: Followed protocol    Danae Chen A Tavonte Seybold

## 2020-08-29 NOTE — Progress Notes (Signed)
[facility] Progress Note   Subjective:   Tolerated HD fine yesterday, no new issues.  Not moving around much so hard to tell if feeling better.   Objective Vitals:   08/28/20 1651 08/28/20 2003 08/29/20 0338 08/29/20 0739  BP: (!) 143/72 132/80 127/61 136/68  Pulse: 75 75 69 79  Resp: 18 18 16 16   Temp: 98.1 F (36.7 C) 98 F (36.7 C) 97.8 F (36.6 C) 98.1 F (36.7 C)  TempSrc: Oral Oral Oral Oral  SpO2: 100% 100% 95% 98%  Weight:   93.5 kg   Height:       Physical Exam General: appears comfortable lying flat Heart: RRR, no rub Lungs: clear Abdomen: soft Extremities: no edema Dialysis Access:  RUE AVF +t/b  Additional Objective Labs: Basic Metabolic Panel: Recent Labs  Lab 08/27/20 1324 08/28/20 0717 08/29/20 0600  NA 142 143 139  K 4.5 4.5 4.5  CL 109 107 104  CO2 18* 21* 24  GLUCOSE 85 100* 72  BUN 72* 51* 35*  CREATININE 6.49* 5.03* 4.37*  CALCIUM 10.0 9.5 9.7  PHOS 7.5* 5.9* 5.1*   Liver Function Tests: Recent Labs  Lab 08/26/20 1716 08/26/20 1716 08/27/20 1324 08/28/20 0717 08/29/20 0600  AST 11*  --   --   --   --   ALT 16  --   --   --   --   ALKPHOS 97  --   --   --   --   BILITOT 0.7  --   --   --   --   PROT 6.6  --   --   --   --   ALBUMIN 3.5   < > 3.6 3.1* 3.2*   < > = values in this interval not displayed.   No results for input(s): LIPASE, AMYLASE in the last 168 hours. CBC: Recent Labs  Lab 08/26/20 1716 08/28/20 0717 08/29/20 0600  WBC 6.3 5.1 5.7  NEUTROABS 4.5  --  3.5  HGB 8.4* 7.7* 8.4*  HCT 27.4* 25.8* 28.2*  MCV 89.5 88.7 90.7  PLT 144* 138* 117*   Blood Culture    Component Value Date/Time   SDES URINE, RANDOM 08/27/2020 1155   SPECREQUEST NONE 08/27/2020 1155   CULT (A) 08/27/2020 1155    <10,000 COLONIES/mL INSIGNIFICANT GROWTH Performed at [facility] [address], [place], [place] [address]    REPTSTATUS 08/28/2020 FINAL 08/27/2020 1155    Cardiac Enzymes: No results for  input(s): CKTOTAL, CKMB, CKMBINDEX, TROPONINI in the last 168 hours. CBG: Recent Labs  Lab 08/28/20 1156 08/28/20 1649 08/28/20 2111 08/29/20 0604 08/29/20 0631  GLUCAP 107* 98 119* 69* 90   Iron Studies: No results for input(s): IRON, TIBC, TRANSFERRIN, FERRITIN in the last 72 hours. @lablastinr3 @ Studies/Results: CT ABDOMEN PELVIS WO CONTRAST  Result Date: 08/27/2020 CLINICAL DATA:  Abdominal pain, primarily left-sided EXAM: CT ABDOMEN AND PELVIS WITHOUT CONTRAST TECHNIQUE: Multidetector CT imaging of the abdomen and pelvis was performed following the standard protocol without IV contrast. COMPARISON:  January 14, 2009 FINDINGS: Lower chest: There are pleural effusions bilaterally, slightly larger on the right than on the left. There is atelectatic change and scarring in the lung bases. There are foci of coronary artery calcification. Hepatobiliary: No focal liver lesions are appreciable on this noncontrast enhanced study. Gallbladder wall does not appear appreciably thickened. There is no biliary duct dilatation. Pancreas: There is no pancreatic mass or inflammatory focus. Spleen: No splenic lesions are  evident. Adrenals/Urinary Tract: Adrenals bilaterally appear normal. There is an extrarenal pelvis on the left, an anatomic variant. There is no appreciable renal mass or hydronephrosis on either side. There is no renal or ureteral calculus on either side. Urinary bladder is midline with wall thickness within normal limits. Stomach/Bowel: There is moderate stool throughout the colon. There is no bowel wall thickening or bowel obstruction. A loop of jejunum extends into a ventral hernia immediately to the left of midline without apparent bowel compromise at the site of this hernia. There are occasional ascending colonic diverticula without diverticulitis. Terminal ileum appears normal. No free air or portal venous air is evident. Vascular/Lymphatic: There is extensive aortic atherosclerosis  throughout the abdominal and pelvic vessels. There is evidence of an aortobifemoral graft, similar in appearance to the prior study from 2010. Abdominal aortic diameter measures 4.3 x 4.1 cm, stable from the 2010 study. No periaortic or perivascular fluid. No adenopathy evident in the abdomen or pelvis. Reproductive: There are calcifications within the prostate. Prostate and seminal vesicles are normal in size and contour. Other: Appendix absent. No periappendiceal region inflammation. Ventral hernia containing loop of small bowel immediately to the left of midline. Neck of this hernia measures 2.0 cm from right to left dimension and 1.4 cm from superior to inferior dimension. No evident abscess in the abdomen or pelvis. There is mild ascites adjacent to the liver. Musculoskeletal: No blastic or lytic bone lesions. There are multiple foci of degenerative change in the lumbar spine as well as degenerative type change in each sacroiliac joint. No intramuscular lesions are evident. IMPRESSION: 1. A loop of small bowel extends into a ventral hernia immediately to the left of midline in the lower abdominal region without bowel compromise. 2. No findings indicative of bowel obstruction. No abscess in the abdomen or pelvis. Appendix absent. 3.  Mild ascites. 4. Previous aortobifemoral grafting, stable appearance compared to 2010 study. Abdominal aorta has a current measured diameter 4.3 x 4.1 cm, stable. Recommend follow-up every 12 months and vascular consultation. This recommendation follows ACR consensus guidelines: White Paper of the ACR Incidental Findings Committee II on Vascular Findings. J Am Coll Radiol 2013; 10:789-794. multifocal coronary artery and pelvic arterial vascular calcification noted. 5. Bilateral pleural effusions, larger on the right than on the left. Bibasilar atelectasis and scarring. 6. No evident renal or ureteral calculus. No hydronephrosis. Urinary bladder wall thickness normal. Incidental note  made of multiple prostatic calculi. Electronically Signed   By: Lowella Grip III M.D.   On: 08/27/2020 09:36   Medications: . ferric gluconate (FERRLECIT/NULECIT) IV Stopped (08/27/20 1611)   . aspirin EC  81 mg Oral Daily  . atorvastatin  40 mg Oral Q1500  . calcitRIOL  0.5 mcg Oral Daily  . [START ON 09/03/2020] darbepoetin (ARANESP) injection - DIALYSIS  60 mcg Intravenous Q Tue-HD  . fluticasone  2 spray Each Nare Daily  . gabapentin  200 mg Oral QHS  . heparin  5,000 Units Subcutaneous Q8H  . insulin aspart  0-5 Units Subcutaneous QHS  . insulin aspart  0-9 Units Subcutaneous TID WC  . metoprolol succinate  50 mg Oral Daily  . rOPINIRole  0.25 mg Oral QHS  . sevelamer carbonate  800 mg Oral TID WC   Assessment/Plan **CKD 5 now ESRD:  Developed uremic symptoms, initiating HD.  AVF in place.  2nd HD yesterday. Referred for CLIP --> will be [place] TTS and I'm ok with D/C today and next HD at [facility] Saturday.  I don't think he needs to stay into the evening to have HD here today, Sat can be his 3rd tx this week.   **Dyspnea: presumed to be related to anemia and should improve.  If it's not improving with uptrending Hb I will have cardiology evaluate outpt.    **Anemia: iron deficiency noted in outpt setting.  IV iron here.  Aranesp initiated.  **HTN:  Cont home meds.  Follow with UF.  **Secondary hyperPTH: cont calcitriol, phos 5.1 at goal. Cont sevelmer 1 TIDAC  **Metabolic acidosis:  improved with HD   **DM: per primary  OK for discharge now --> will be TTS at [facility], time pending but dialysis coordinator will let him know what time.    Jannifer Hick MD 08/29/2020, 8:16 AM  [facility] Pager: [phone]

## 2020-08-29 NOTE — Progress Notes (Signed)
Per nephrology MD [name], ok to give morning med's.

## 2020-08-30 DIAGNOSIS — Z111 Encounter for screening for respiratory tuberculosis: Secondary | ICD-10-CM

## 2020-08-30 DIAGNOSIS — D509 Iron deficiency anemia, unspecified: Secondary | ICD-10-CM | POA: Insufficient documentation

## 2020-08-30 DIAGNOSIS — N2581 Secondary hyperparathyroidism of renal origin: Secondary | ICD-10-CM

## 2020-08-30 DIAGNOSIS — T7840XA Allergy, unspecified, initial encounter: Secondary | ICD-10-CM

## 2020-08-30 DIAGNOSIS — R52 Pain, unspecified: Secondary | ICD-10-CM | POA: Insufficient documentation

## 2020-08-30 DIAGNOSIS — T782XXA Anaphylactic shock, unspecified, initial encounter: Secondary | ICD-10-CM

## 2020-08-30 DIAGNOSIS — E1121 Type 2 diabetes mellitus with diabetic nephropathy: Secondary | ICD-10-CM | POA: Insufficient documentation

## 2020-08-30 DIAGNOSIS — N189 Chronic kidney disease, unspecified: Secondary | ICD-10-CM

## 2020-08-30 DIAGNOSIS — D631 Anemia in chronic kidney disease: Secondary | ICD-10-CM

## 2020-08-30 DIAGNOSIS — L299 Pruritus, unspecified: Secondary | ICD-10-CM

## 2020-08-30 HISTORY — DX: Anemia in chronic kidney disease: D63.1

## 2020-08-30 HISTORY — DX: Anaphylactic shock, unspecified, initial encounter: T78.2XXA

## 2020-08-30 HISTORY — DX: Iron deficiency anemia, unspecified: D50.9

## 2020-08-30 HISTORY — DX: Secondary hyperparathyroidism of renal origin: N25.81

## 2020-08-30 HISTORY — DX: Pruritus, unspecified: L29.9

## 2020-08-30 HISTORY — DX: Allergy, unspecified, initial encounter: T78.40XA

## 2020-08-30 HISTORY — DX: Pain, unspecified: R52

## 2020-08-30 HISTORY — DX: Encounter for screening for respiratory tuberculosis: Z11.1

## 2020-08-30 HISTORY — DX: Anemia in chronic kidney disease: N18.9

## 2020-08-30 HISTORY — DX: Type 2 diabetes mellitus with diabetic nephropathy: E11.21

## 2020-08-31 ENCOUNTER — Telehealth: Payer: Self-pay | Admitting: Nephrology

## 2020-08-31 DIAGNOSIS — Z8679 Personal history of other diseases of the circulatory system: Secondary | ICD-10-CM | POA: Insufficient documentation

## 2020-08-31 DIAGNOSIS — I6529 Occlusion and stenosis of unspecified carotid artery: Secondary | ICD-10-CM | POA: Insufficient documentation

## 2020-08-31 DIAGNOSIS — Z951 Presence of aortocoronary bypass graft: Secondary | ICD-10-CM

## 2020-08-31 DIAGNOSIS — Z992 Dependence on renal dialysis: Secondary | ICD-10-CM

## 2020-08-31 DIAGNOSIS — M1388 Other specified arthritis, other site: Secondary | ICD-10-CM | POA: Insufficient documentation

## 2020-08-31 DIAGNOSIS — I712 Thoracic aortic aneurysm, without rupture, unspecified: Secondary | ICD-10-CM | POA: Insufficient documentation

## 2020-08-31 DIAGNOSIS — I7090 Unspecified atherosclerosis: Secondary | ICD-10-CM | POA: Insufficient documentation

## 2020-08-31 HISTORY — DX: Dependence on renal dialysis: Z99.2

## 2020-08-31 HISTORY — DX: Personal history of other diseases of the circulatory system: Z86.79

## 2020-08-31 HISTORY — DX: Other specified arthritis, other site: M13.88

## 2020-08-31 HISTORY — DX: Disorder of phosphorus metabolism, unspecified: E83.30

## 2020-08-31 HISTORY — DX: Presence of aortocoronary bypass graft: Z95.1

## 2020-08-31 NOTE — Telephone Encounter (Signed)
Transition of Care Contact from [place]   Date of Discharge: 08/29/20 Date of Contact: 08/31/20 Method of contact: phone Talked to patient   Patient contacted to discuss transition of care form recent hospitaliztion. Patient was admitted to [facility] from 10/5 to 08/29/20 with the discharge diagnosis of uremia, new ESRD started on hemodialysis.   Medication changes were reviewed - d/c home calcitriol and iron. Patient will follow up with is outpatient dialysis center today and 09/03/20.  Other follow up needs include none identified.   Jen Mow, PA-C [facility] Pager: [phone]

## 2020-09-02 IMAGING — CT CT CHEST W/O CM
2 of 4 series · 14 of 36 positions shown, 17 images · non-contrast
Comparison: Radiograph 01/30/2020, additional prior radiographs
reviewed. Chest CT 03/24/2019.

CLINICAL DATA: Pleural effusion. Abnormal normal x-ray. Patient had
reported thoracentesis 01/09/2020

EXAM:
CT CHEST WITHOUT CONTRAST
TECHNIQUE: Multidetector CT imaging of the chest was performed following the
standard protocol without IV contrast.

[Series 2: thorax · axial · 0.78mm/px · z∈[-361,-83]mm · 11 of 165 slices shown, 14 images]
[im 13/165  mediastinal]
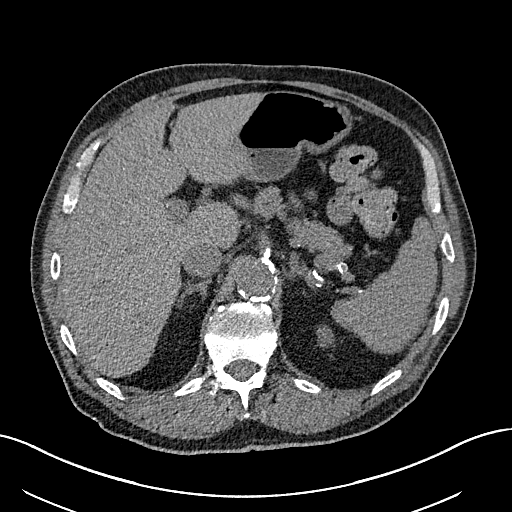
[im 13/165  lung]
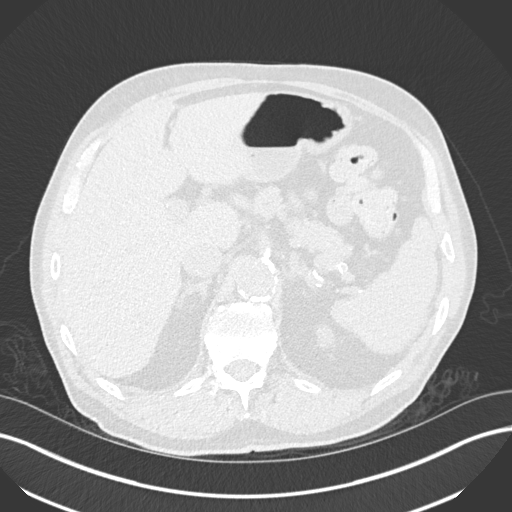
[im 26/165  lung]
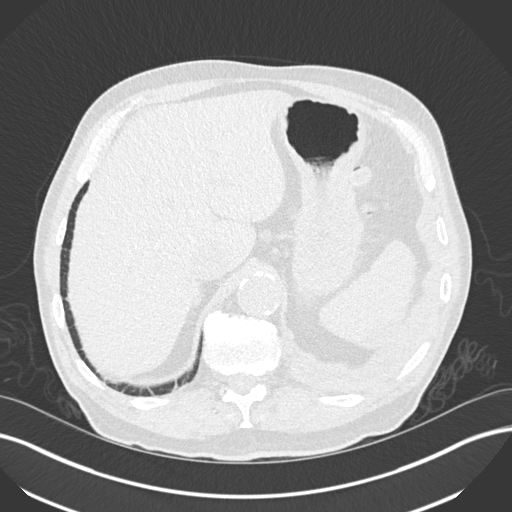
[im 38/165  lung]
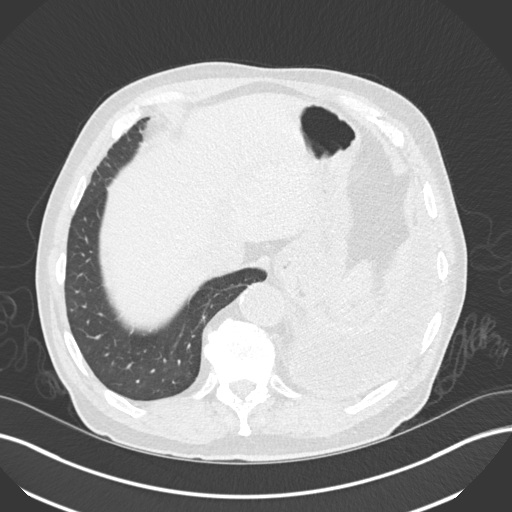
[im 51/165  lung]
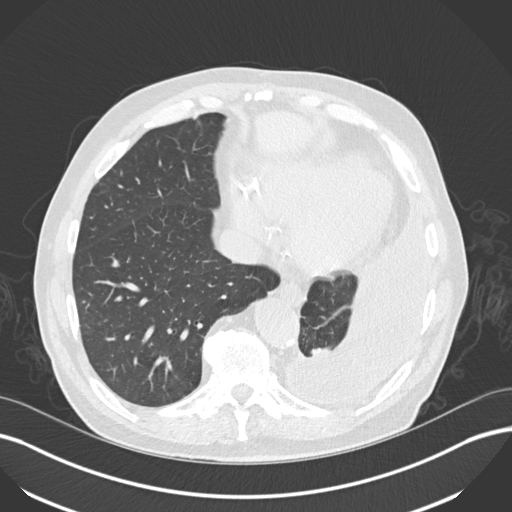
[im 64/165  mediastinal]
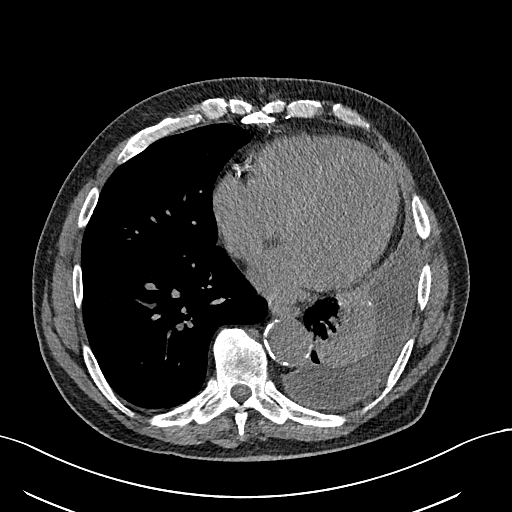
[im 64/165  lung]
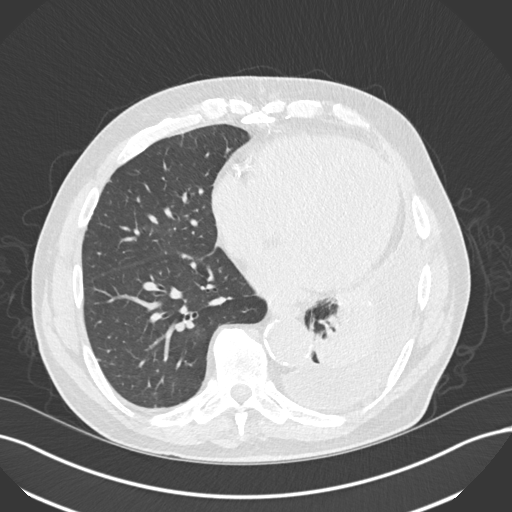
[im 89/165  lung]
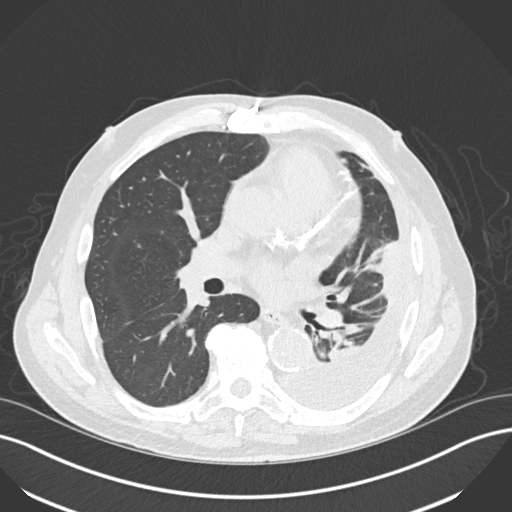
[im 101/165  lung]
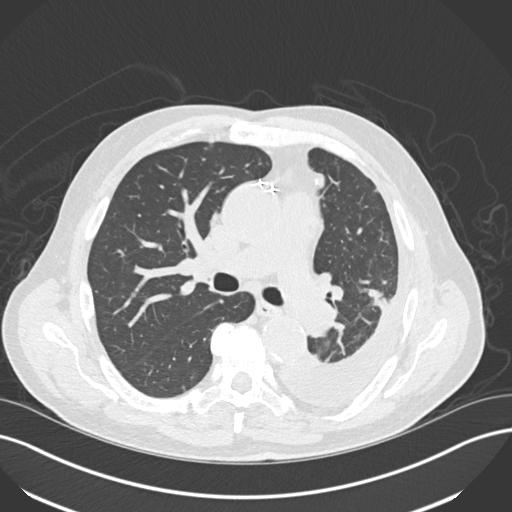
[im 114/165  lung]
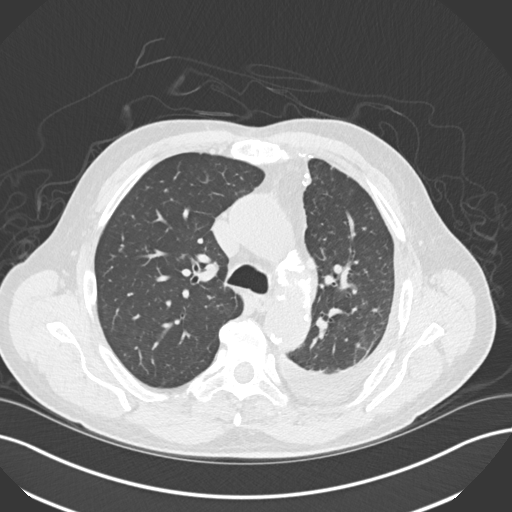
[im 127/165  mediastinal]
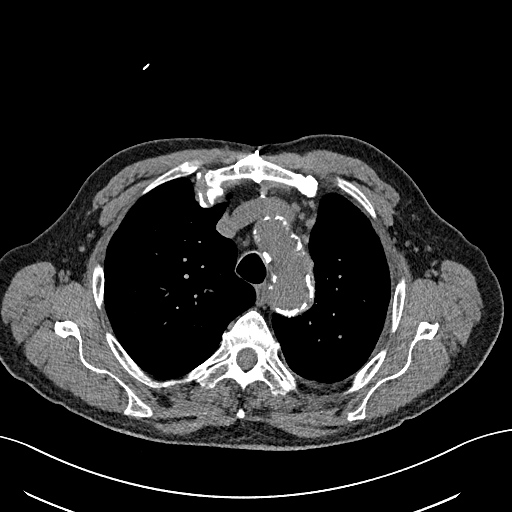
[im 127/165  lung]
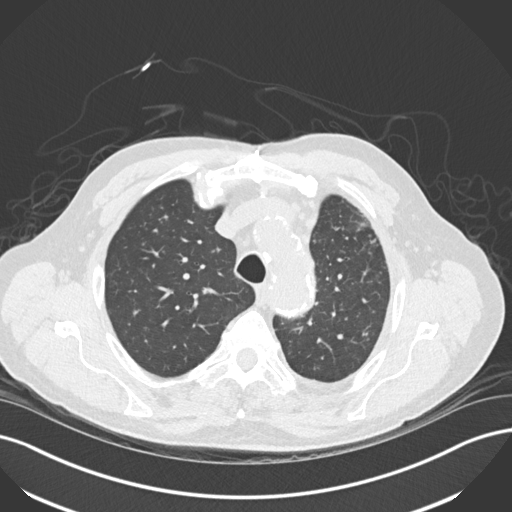
[im 139/165  lung]
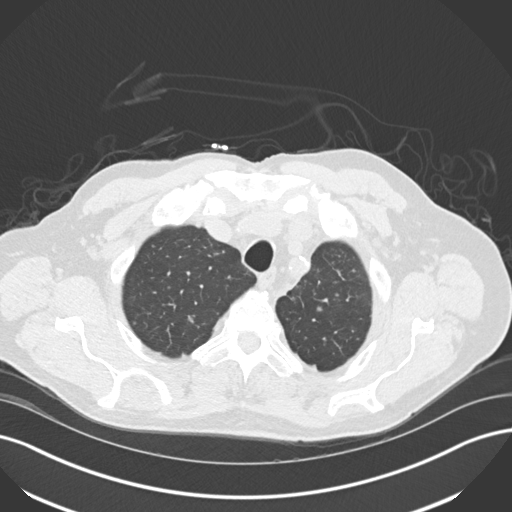
[im 152/165  lung]
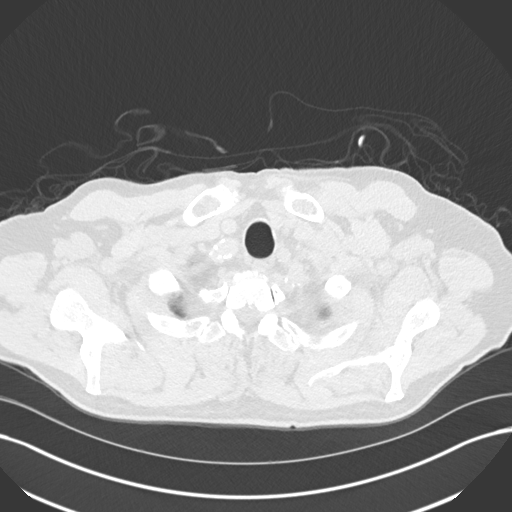

[Series 5: coronal · coronal · 0.64mm/px · 3 of 151 slices shown]
[im 31/151  lung]
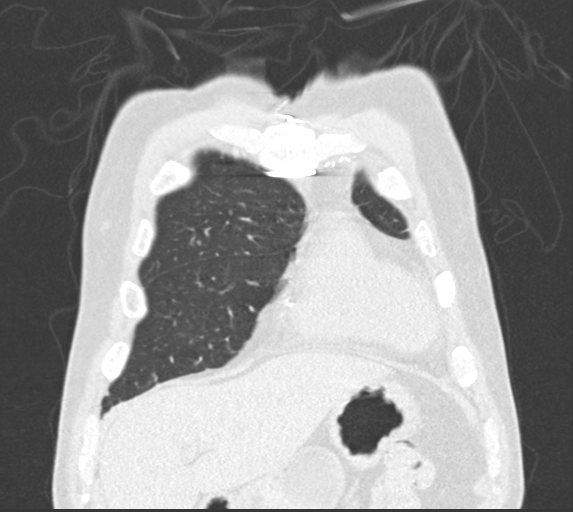
[im 61/151  lung]
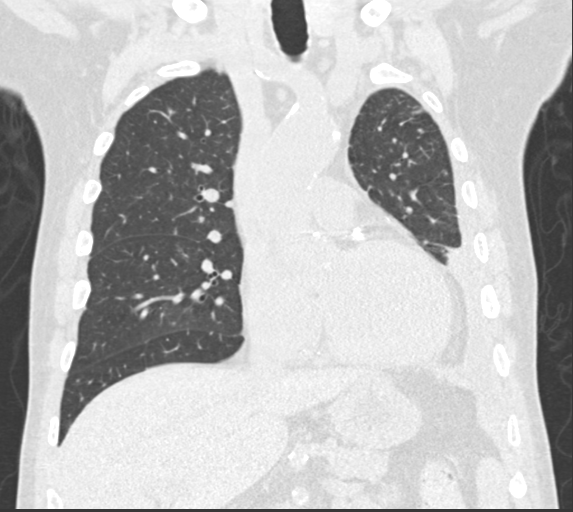
[im 91/151  lung]
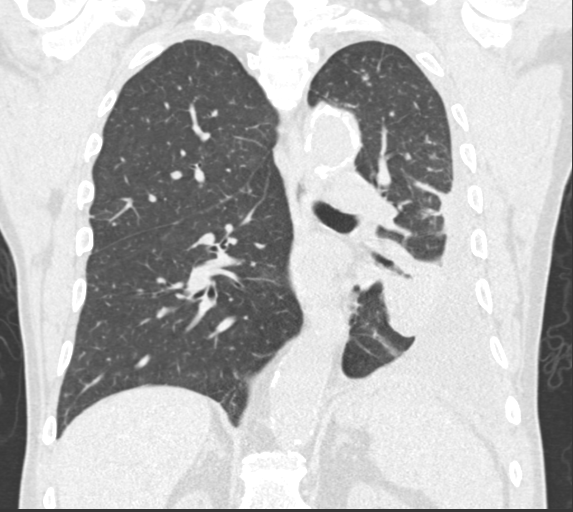

[14 of 36 positions shown; findings below may reference images not displayed]

FINDINGS: Cardiovascular: Dense coronary artery calcifications of native
coronary arteries post CABG. Atherosclerosis of the thoracic aorta
with fusiform ascending aneurysmal dilatation, maximum dimension
cm. The previous broad-based saccular lesion in the distal aortic
arch is grossly stable, no periaortic stranding. There is branch
vessel atherosclerosis. Mild cardiomegaly without pericardial
effusion.

Mediastinum/Nodes: Multiple prominent lymph nodes, all subcentimeter
and majority are stable from prior exam. An 8 mm lower paratracheal
node has slightly increased from prior. Limited assessment for hilar
adenopathy in the absence of IV contrast. No visualized thyroid
nodule. No esophageal wall thickening.

Lungs/Pleura: Moderate size left pleural effusion is loculated and
tracks anteriorly. There is also pleural thickening dependently.
Left lower lobe consolidation is likely in part compressive, with
probable round atelectasis superiorly. Lobulated area of
consolidation in the lingula adjacent to pleural effusion may also
represent round atelectasis, but margins are slightly irregular and
is nonspecific, series 3, image 83. Multiple small nodules are
reticulonodular opacities at the left lung apex. There are
innumerable tiny nodules throughout the right lung. These nodules
are all in the 3-6 mm range trachea and mainstem bronchi are
patent.

Upper Abdomen: Atherosclerosis of upper abdominal vasculature.
Scarring of the included kidneys. No acute findings. No adrenal
nodule.

Musculoskeletal: Median sternotomy. There are no acute or suspicious
osseous abnormalities. No focal bone lesion. Subcortical cyst in the
left proximal humerus also present on prior exam.
IMPRESSION: 1. Moderate loculated left pleural effusion with pleural thickening.
Left lower lobe consolidation is likely in part compressive, with
probable round atelectasis superiorly. Additional area of subpleural
opacity in the lingula may represent atelectasis, however has
slightly spiculated margins. Pneumonia or malignancy is not
excluded. Recommend correlation with recent thoracentesis results.
2. Innumerable tiny nodules throughout the right lung, all in the
3-6 mm range. This is likely infectious or inflammatory, however
metastatic disease is not entirely excluded.
3. Unchanged saccular lesion in the distal aortic arch. No evidence
of acute aortic inflammation on noncontrast exam.
4. Multiple prominent mediastinal lymph nodes, likely reactive but
nonspecific.
5. Dense coronary artery calcifications post CABG.
6. Fusiform ascending aortic aneurysm. Recommend annual imaging
followup by CTA or MRA. This recommendation follows 8222
ACCF/AHA/AATS/ACR/ASA/SCA/BIBONIMANA IG/TIGER/DEEQA RAYAAN/ALBELARD Guidelines for the
Diagnosis and Management of Patients with Thoracic Aortic Disease.
Circulation. 8222; 121: E266-e369. Aortic aneurysm NOS (MQ6FJ-XMM.N)

Aortic Atherosclerosis (MQ6FJ-X6E.E).

## 2020-09-02 NOTE — Discharge Summary (Signed)
Name: John [name] MRN: [id number] DOB: 1951/09/05 69 y.o. PCP: John Riches, NP  Date of Admission: 08/26/2020  5:01 PM Date of Discharge: 08/29/2020 Attending Physician: Dr. Angelia Mould  Discharge Diagnosis: Active Problems:   DM (diabetes mellitus), type 2 with renal complications [facility])   Essential hypertension   ESRD (end stage renal disease) ([place])    Discharge Medications: Allergies as of 08/29/2020      Reactions   Penicillins Rash, Other (See Comments)   Did it involve swelling of the face/tongue/throat, SOB, or low BP? No Did it involve sudden or severe rash/hives, skin peeling, or any reaction on the inside of your mouth or nose? Yes Did you need to seek medical attention at a hospital or doctor's office? Yes When did it last happen?Childhood allergy If all above answers are "NO", may proceed with cephalosporin use.      Medication List    STOP taking these medications   torsemide 20 MG tablet Commonly known as: DEMADEX     TAKE these medications   acetaminophen 500 MG tablet Commonly known as: TYLENOL Take 1,500 mg by mouth 2 (two) times daily as needed (pain).   albuterol 108 (90 Base) MCG/ACT inhaler Commonly known as: VENTOLIN HFA TAKE 2 PUFFS BY MOUTH EVERY 6 HOURS AS NEEDED FOR WHEEZE OR SHORTNESS OF BREATH What changed: See the new instructions.   amLODipine 5 MG tablet Commonly known as: NORVASC Take 5 mg by mouth daily.   aspirin EC 81 MG tablet Take 81 mg by mouth daily.   atorvastatin 40 MG tablet Commonly known as: LIPITOR Take 40 mg by mouth daily in the afternoon.   calcitRIOL 0.5 MCG capsule Commonly known as: ROCALTROL Take 0.5 mcg by mouth daily.   ferrous sulfate 325 (65 FE) MG tablet Take 325 mg by mouth 2 (two) times daily with a meal.   fluticasone 50 MCG/ACT nasal spray Commonly known as: FLONASE Place 1 spray into both nostrils daily as needed for allergies or rhinitis.   gabapentin 100 MG capsule Commonly  known as: NEURONTIN Take 2 capsules (200 mg total) by mouth at bedtime.   glipiZIDE 2.5 MG 24 hr tablet Commonly known as: GLUCOTROL XL Take 2.5 mg by mouth 2 (two) times daily.   hydrALAZINE 10 MG tablet Commonly known as: APRESOLINE Take 1 tablet (10 mg total) by mouth 3 (three) times daily.   linagliptin 5 MG Tabs tablet Commonly known as: TRADJENTA Take 5 mg by mouth daily.   metoprolol succinate 50 MG 24 hr tablet Commonly known as: TOPROL-XL Take 50 mg by mouth daily.   OVER THE COUNTER MEDICATION Place 1 drop into both eyes 2 (two) times daily as needed (dry eyes). Hyper Tears eye drops   rOPINIRole 0.25 MG tablet Commonly known as: REQUIP Take 0.25 mg by mouth at bedtime.   sevelamer carbonate 800 MG tablet Commonly known as: RENVELA Take 1 tablet (800 mg total) by mouth 3 (three) times daily with meals.   tamsulosin 0.4 MG Caps capsule Commonly known as: FLOMAX Take 0.4 mg by mouth daily.   Trulicity 1.5 WU/9.8JX Sopn Generic drug: Dulaglutide Inject 1.5 mg into the skin every Sunday.   zolpidem 10 MG tablet Commonly known as: AMBIEN Take 10 mg by mouth at bedtime.       Disposition and follow-up:   John [name] was discharged from [facility] in Stable condition.  At the hospital follow up visit please address:  1.  Follow-up:  A. End Stage Renal Disease Dialysis Treatment    B. Diabetes Management following start of dialysis  2.  Labs / imaging needed at time of follow-up: BMP  3.  Pending labs/ test needing follow-up: None  Follow-up Appointments:  Follow-up Information    John Riches, NP.   Why: Please follow up in a week Contact information: [place] [id number] [phone]               Hospital Course by problem list:  1. End Stage Renal Disease  John [name] presented to the [facility] on 08/26/20 with several weeks of progressive shortness of breath and fatigue. He was found to be  clinically hypervolemic on exam. Labs showed an acute worsening of kidney function (Creatinine was 6.8, up from 5.4 a few months prior). He was diagnosed with acute renal failure and nephrology was consulted. Nephrology recommended initiating dialysis. Patient already had an AVF placed several months ago and HD was initiated in the hospital. There were some issues with canalization of the fistula at the first HD session, but it remained patent and usable and he was successfully dialyzed twice while in the hospital with moderate improvement in his symptoms. Patient maintained strong pressures after dialysis sessions and was continued on his home blood pressure medications. Our renal social workers arranged outpatient dialysis for him prior to discharge.   2. Type 2 Diabetes Mellitus Patient with previous diagnosis, home medication of trulicity and tradjenta. His last A1c was 6.3. The patient had one episode of mild hypoglycemia that resolved. Recommend patient follow up with primary care provider due adjust diabetes medications as needed once patient begins dialysis sessions.   Discharge Vitals:   BP 135/70 (BP Location: Left Arm)   Pulse 75   Temp 98.1 F (36.7 C) (Oral)   Resp 18   Ht 6' (1.829 m)   Wt 93.5 kg Comment: scale c  SpO2 98%   BMI 27.97 kg/m   Pertinent Labs, Studies, and Procedures:  CBC Latest Ref Rng & Units 08/29/2020 08/28/2020 08/26/2020  WBC 4.0 - 10.5 K/uL 5.7 5.1 6.3  Hemoglobin 13.0 - 17.0 g/dL 8.4(L) 7.7(L) 8.4(L)  Hematocrit 39 - 52 % 28.2(L) 25.8(L) 27.4(L)  Platelets 150 - 400 K/uL 117(L) 138(L) 144(L)    CMP Latest Ref Rng & Units 08/29/2020 08/28/2020 08/27/2020  Glucose 70 - 99 mg/dL 72 100(H) 85  BUN 8 - 23 mg/dL 35(H) 51(H) 72(H)  Creatinine 0.61 - 1.24 mg/dL 4.37(H) 5.03(H) 6.49(H)  Sodium 135 - 145 mmol/L 139 143 142  Potassium 3.5 - 5.1 mmol/L 4.5 4.5 4.5  Chloride 98 - 111 mmol/L 104 107 109  CO2 22 - 32 mmol/L 24 21(L) 18(L)  Calcium 8.9 - 10.3 mg/dL  9.7 9.5 10.0  Total Protein 6.5 - 8.1 g/dL - - -  Total Bilirubin 0.3 - 1.2 mg/dL - - -  Alkaline Phos 38 - 126 U/L - - -  AST 15 - 41 U/L - - -  ALT 0 - 44 U/L - - -    CT ABDOMEN PELVIS WO CONTRAST  Result Date: 08/27/2020 CLINICAL DATA:  Abdominal pain, primarily left-sided EXAM: CT ABDOMEN AND PELVIS WITHOUT CONTRAST TECHNIQUE: Multidetector CT imaging of the abdomen and pelvis was performed following the standard protocol without IV contrast. COMPARISON:  January 14, 2009 FINDINGS: Lower chest: There are pleural effusions bilaterally, slightly larger on the right than on the left. There is atelectatic change and scarring in the  lung bases. There are foci of coronary artery calcification. Hepatobiliary: No focal liver lesions are appreciable on this noncontrast enhanced study. Gallbladder wall does not appear appreciably thickened. There is no biliary duct dilatation. Pancreas: There is no pancreatic mass or inflammatory focus. Spleen: No splenic lesions are evident. Adrenals/Urinary Tract: Adrenals bilaterally appear normal. There is an extrarenal pelvis on the left, an anatomic variant. There is no appreciable renal mass or hydronephrosis on either side. There is no renal or ureteral calculus on either side. Urinary bladder is midline with wall thickness within normal limits. Stomach/Bowel: There is moderate stool throughout the colon. There is no bowel wall thickening or bowel obstruction. A loop of jejunum extends into a ventral hernia immediately to the left of midline without apparent bowel compromise at the site of this hernia. There are occasional ascending colonic diverticula without diverticulitis. Terminal ileum appears normal. No free air or portal venous air is evident. Vascular/Lymphatic: There is extensive aortic atherosclerosis throughout the abdominal and pelvic vessels. There is evidence of an aortobifemoral graft, similar in appearance to the prior study from 2010. Abdominal aortic  diameter measures 4.3 x 4.1 cm, stable from the 2010 study. No periaortic or perivascular fluid. No adenopathy evident in the abdomen or pelvis. Reproductive: There are calcifications within the prostate. Prostate and seminal vesicles are normal in size and contour. Other: Appendix absent. No periappendiceal region inflammation. Ventral hernia containing loop of small bowel immediately to the left of midline. Neck of this hernia measures 2.0 cm from right to left dimension and 1.4 cm from superior to inferior dimension. No evident abscess in the abdomen or pelvis. There is mild ascites adjacent to the liver. Musculoskeletal: No blastic or lytic bone lesions. There are multiple foci of degenerative change in the lumbar spine as well as degenerative type change in each sacroiliac joint. No intramuscular lesions are evident. IMPRESSION: 1. A loop of small bowel extends into a ventral hernia immediately to the left of midline in the lower abdominal region without bowel compromise. 2. No findings indicative of bowel obstruction. No abscess in the abdomen or pelvis. Appendix absent. 3.  Mild ascites. 4. Previous aortobifemoral grafting, stable appearance compared to 2010 study. Abdominal aorta has a current measured diameter 4.3 x 4.1 cm, stable. Recommend follow-up every 12 months and vascular consultation. This recommendation follows ACR consensus guidelines: White Paper of the ACR Incidental Findings Committee II on Vascular Findings. J Am Coll Radiol 2013; 10:789-794. multifocal coronary artery and pelvic arterial vascular calcification noted. 5. Bilateral pleural effusions, larger on the right than on the left. Bibasilar atelectasis and scarring. 6. No evident renal or ureteral calculus. No hydronephrosis. Urinary bladder wall thickness normal. Incidental note made of multiple prostatic calculi. Electronically Signed   By: Lowella Grip III M.D.   On: 08/27/2020 09:36   DG Chest 2 View  Result Date:  08/26/2020 CLINICAL DATA:  Shortness of breath.  Fatigue. EXAM: CHEST - 2 VIEW COMPARISON:  Most recent radiograph 03/12/2020 FINDINGS: Post median sternotomy and CABG. Upper normal heart size. Unchanged mediastinal contours with aortic atherosclerosis. Minimal pleural thickening/small effusion at the left lung base, similar or slightly improved from prior exam. There is a small right pleural effusion. Vascular congestion with question of early pulmonary edema. No pneumothorax. Surgical hardware in the lower cervical spine is partially included. No acute osseous abnormalities are seen. IMPRESSION: Borderline cardiomegaly with vascular congestion and question of early pulmonary edema. Small bilateral pleural effusions. Findings suggest mild fluid overload. Aortic Atherosclerosis (  ICD10-I70.0). Electronically Signed   By: Keith Rake M.D.   On: 08/26/2020 17:44     Discharge Instructions: Discharge Instructions    Call MD for:  difficulty breathing, headache or visual disturbances   Complete by: As directed    Call MD for:  persistant nausea and vomiting   Complete by: As directed    Call MD for:  redness, tenderness, or signs of infection (pain, swelling, redness, odor or green/yellow discharge around incision site)   Complete by: As directed    Call MD for:  severe uncontrolled pain   Complete by: As directed    Call MD for:  temperature >100.4   Complete by: As directed    Diet - low sodium heart healthy   Complete by: As directed    Increase activity slowly   Complete by: As directed       Signed: Riesa Pope, MD 09/04/2020, 9:20 PM   Pager: [phone]

## 2020-09-06 ENCOUNTER — Telehealth: Payer: Self-pay | Admitting: Emergency Medicine

## 2020-09-06 ENCOUNTER — Telehealth: Payer: Self-pay

## 2020-09-06 DIAGNOSIS — G2581 Restless legs syndrome: Secondary | ICD-10-CM

## 2020-09-06 HISTORY — DX: Restless legs syndrome: G25.81

## 2020-09-06 NOTE — Telephone Encounter (Signed)
Called the requesting office of [place] and left a detailed message asking if someone could give our office a call and that I am calling to find out the number of teeth being extracted.

## 2020-09-06 NOTE — Telephone Encounter (Signed)
Please verify the number of teeth extracted

## 2020-09-06 NOTE — Telephone Encounter (Signed)
Caryl Pina with [facility] calling in to check on surgical clearance

## 2020-09-06 NOTE — Telephone Encounter (Signed)
° °  [facility] Pre-operative Risk Assessment    HEARTCARE STAFF: - Please ensure there is not already an duplicate clearance open for this procedure. - Under Visit Info/Reason for Call, type in Other and utilize the format Clearance MM/DD/YY or Clearance TBD. Do not use dashes or single digits. - If request is for dental extraction, please clarify the # of teeth to be extracted.  Request for surgical clearance:  1. What type of surgery is being performed? Dental extraction (# not specified)    2. When is this surgery scheduled? 09/09/20   3. What type of clearance is required (medical clearance vs. Pharmacy clearance to hold med vs. Both)? Both  4. Are there any medications that need to be held prior to surgery and how long?Not specified    5. Practice name and name of physician performing surgery? Research scientist (physical sciences) not specified    6. What is the office phone number? [phone]   7.   What is the office fax number? [phone]  8.   Anesthesia type (None, local, MAC, general) ? None specified    Senaida Ores 09/06/2020, 9:24 AM  _________________________________________________________________   (provider comments below)

## 2020-09-09 NOTE — Telephone Encounter (Signed)
   Andrew calling back, she said the pt will have 2 teeth extractions today and pt will coming at 12 nn and they need clearance before pt's appt.

## 2020-09-09 NOTE — Telephone Encounter (Signed)
   Primary Cardiologist: Jenne Campus, MD  Chart reviewed as part of pre-operative protocol coverage.   Simple dental extractions of 1 or 2 teeth are considered low risk procedures per guidelines and generally do not require any specific cardiac clearance. It is also generally accepted that for simple extractions and dental cleanings, there is no need to interrupt blood thinner therapy.   SBE prophylaxis is not required for the patient from a cardiac standpoint.  I will route this recommendation to the requesting party via Epic fax function and remove from pre-op pool.  Please call with questions.  Tami Lin Abdo Denault, PA 09/09/2020, 11:27 AM

## 2020-09-09 NOTE — Telephone Encounter (Signed)
[facility] notified [facility] to requesting providers office via [employer] function

## 2020-09-09 NOTE — Telephone Encounter (Signed)
LM2CB-need # teeth

## 2020-10-08 HISTORY — DX: Hypercalcemia: E83.52

## 2020-10-11 IMAGING — DX DG CHEST 2V
2 series · 2 of 2 positions shown · non-contrast
Comparison: Chest x-rays dated 02/24/2020 and 02/19/2020 and chest
CT dated 02/02/2020

CLINICAL DATA: Left pleural effusion. VATS procedure on 02/19/2020.

EXAM:
CHEST - 2 VIEW

[dg chest 2 view (1 of 2)]
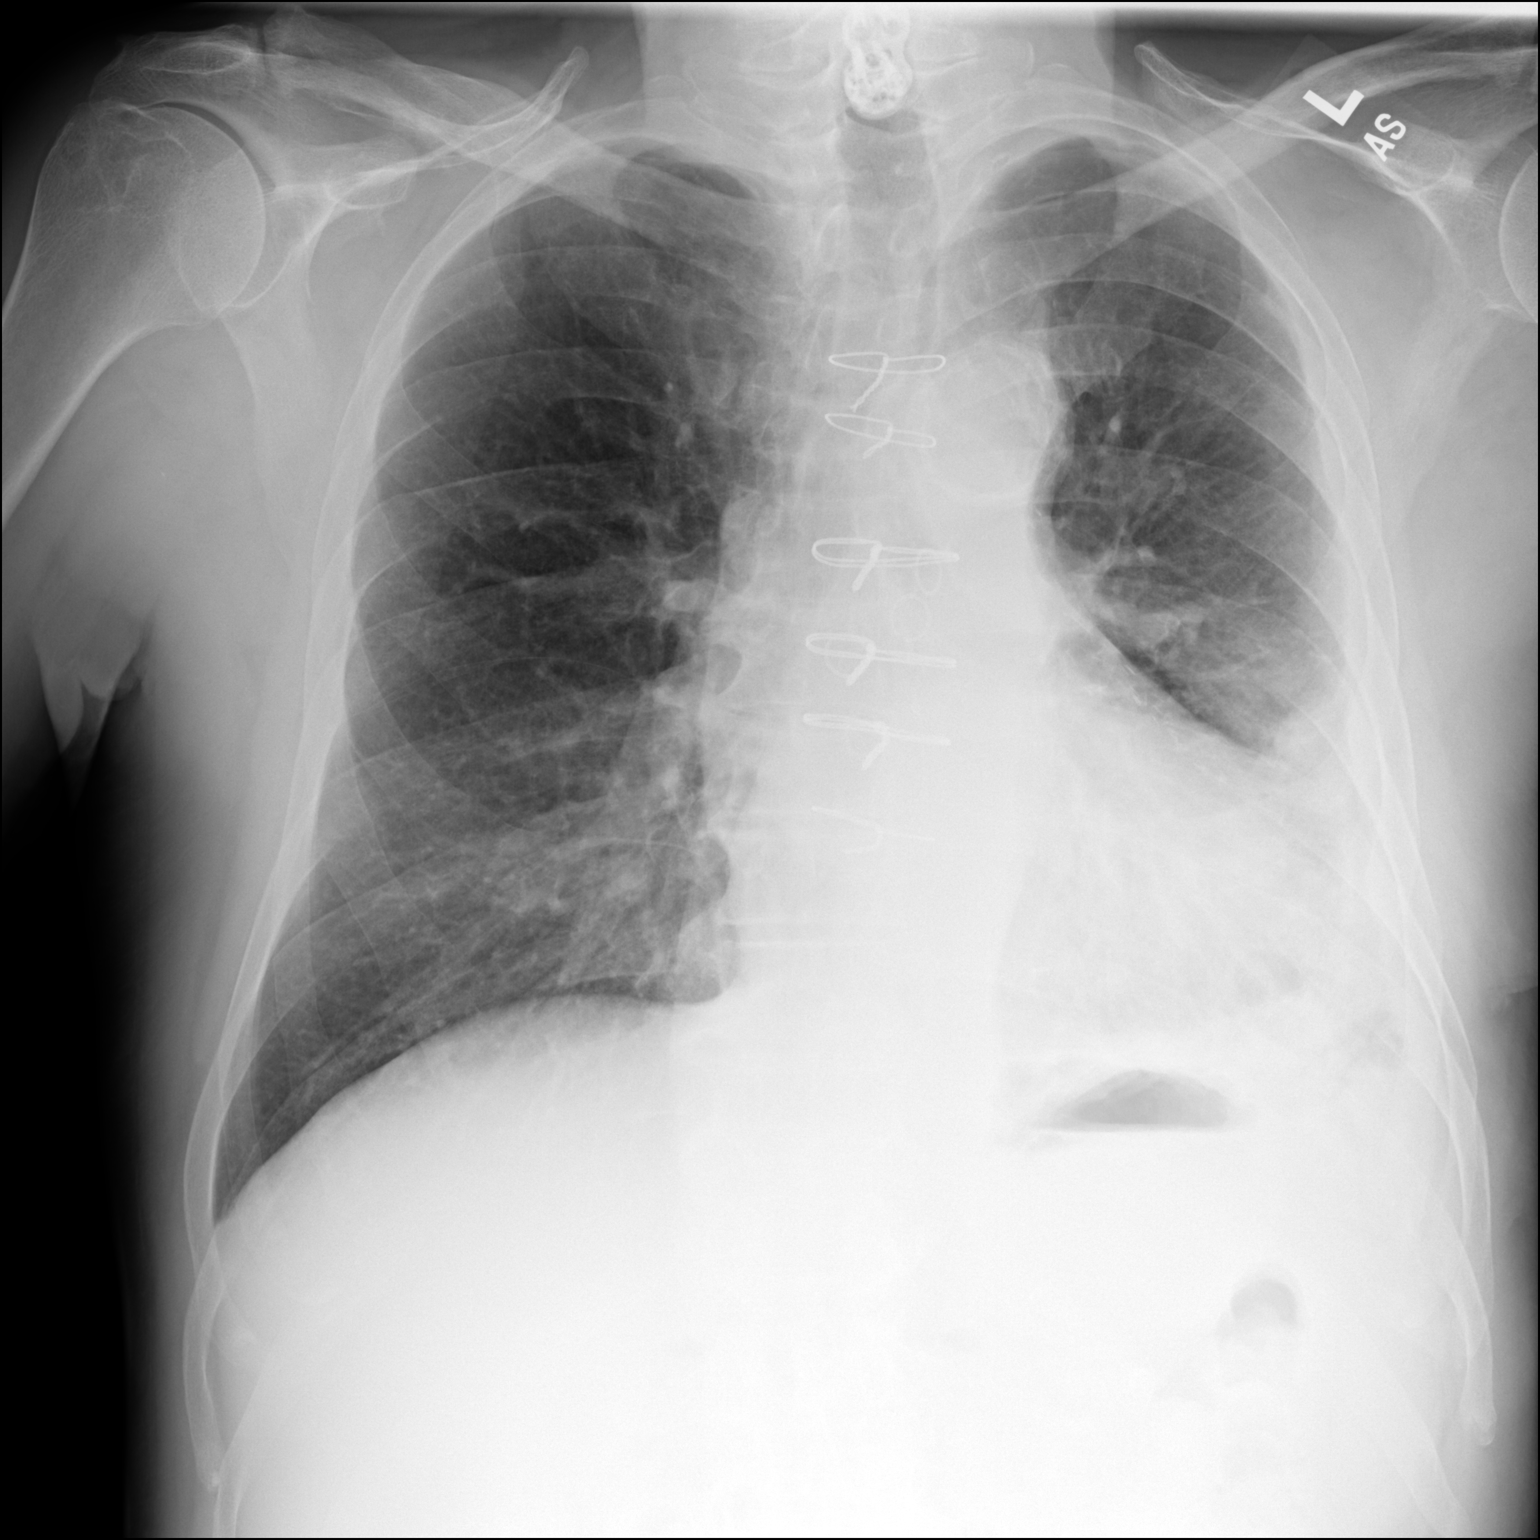

[dg chest 2 view (2 of 2)]
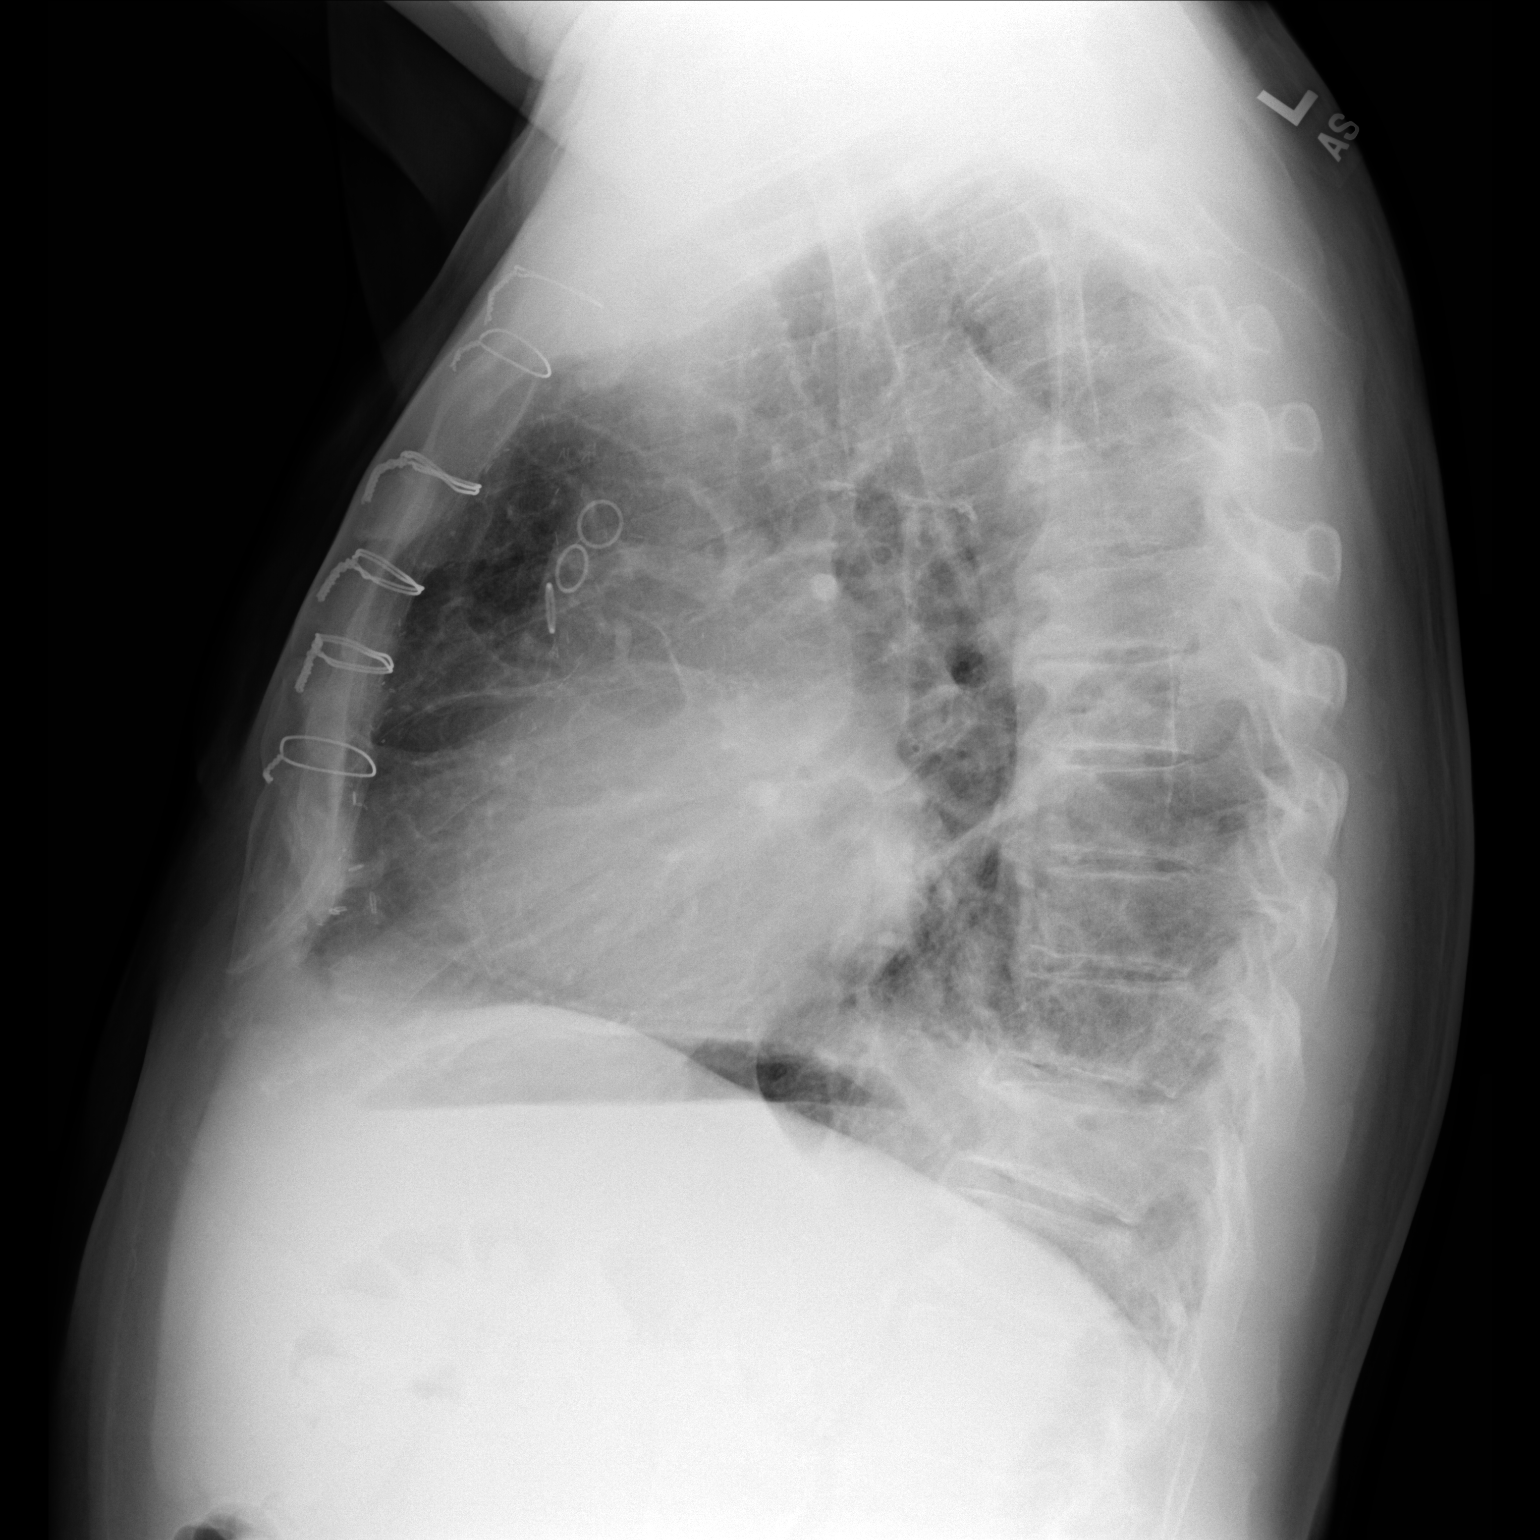

[2 of 2 positions shown; findings below may reference images not displayed]

FINDINGS: There is improved aeration at the left lung base with decreased
pleural thickening laterally. No pneumothorax. The heart size and
pulmonary vascularity are normal in the right lung is clear.

Heart size and pulmonary vascularity are normal. Aortic
atherosclerosis. CABG.

No significant bone abnormality. Central line has been removed.
Subcutaneous emphysema on the left has resolved.
IMPRESSION: Improved aeration at the left lung base with decreased pleural
thickening laterally. No pneumothorax.

## 2020-10-28 ENCOUNTER — Other Ambulatory Visit: Payer: Self-pay | Admitting: Cardiology

## 2020-11-06 ENCOUNTER — Ambulatory Visit: Payer: Medicare HMO | Admitting: Cardiology

## 2020-11-21 ENCOUNTER — Ambulatory Visit: Payer: Medicare HMO | Admitting: Cardiology

## 2020-12-16 DIAGNOSIS — N289 Disorder of kidney and ureter, unspecified: Secondary | ICD-10-CM | POA: Insufficient documentation

## 2020-12-16 DIAGNOSIS — I219 Acute myocardial infarction, unspecified: Secondary | ICD-10-CM | POA: Insufficient documentation

## 2020-12-16 DIAGNOSIS — I10 Essential (primary) hypertension: Secondary | ICD-10-CM | POA: Insufficient documentation

## 2020-12-16 DIAGNOSIS — I6529 Occlusion and stenosis of unspecified carotid artery: Secondary | ICD-10-CM | POA: Insufficient documentation

## 2020-12-16 DIAGNOSIS — R0600 Dyspnea, unspecified: Secondary | ICD-10-CM | POA: Insufficient documentation

## 2020-12-18 ENCOUNTER — Ambulatory Visit: Payer: Medicare HMO | Admitting: Cardiology

## 2020-12-18 ENCOUNTER — Encounter: Payer: Self-pay | Admitting: Cardiology

## 2020-12-18 ENCOUNTER — Other Ambulatory Visit: Payer: Self-pay

## 2020-12-18 VITALS — BP 142/62 | HR 88 | Ht 72.0 in | Wt 204.0 lb

## 2020-12-18 DIAGNOSIS — Z09 Encounter for follow-up examination after completed treatment for conditions other than malignant neoplasm: Secondary | ICD-10-CM

## 2020-12-18 DIAGNOSIS — Z951 Presence of aortocoronary bypass graft: Secondary | ICD-10-CM | POA: Diagnosis not present

## 2020-12-18 DIAGNOSIS — Z992 Dependence on renal dialysis: Secondary | ICD-10-CM

## 2020-12-18 DIAGNOSIS — I10 Essential (primary) hypertension: Secondary | ICD-10-CM

## 2020-12-18 NOTE — Patient Instructions (Signed)

## 2020-12-18 NOTE — Progress Notes (Signed)
Cardiology Office Note:    Date:  12/18/2020   ID:  John [name], DOB [birth year], MRN [id number]  PCP:  Imagene Riches, NP  Cardiologist:  Jenne Campus, MD    Referring MD: Imagene Riches, NP   Chief Complaint  Patient presents with  . Follow-up  I am doing fine  History of Present Illness:    John [name] is a 70 y.o. male The patient has a history of coronary artery disease with prior PCI to the RCA, type 2 diabetes, hypertension, PAD status post aortofemoral bypass, end-stage IVchronic kidney disease.He was found to have elevated troponin, and therefore cardiac catheterization was completed on 03/27/2019.  Cardiac cath revealed severe multivessel disease to include 90% mid RCA lesion, ostial circumflex to proximal circumflex lesion 80% stenosed, proximal LAD to mid LAD 75% stenosed with markedly positive DFR 0.71, first diagonal was 70% stenosed, proximal RCA to mid RCA was 35% stenosed, distal RCA 1 lesion 40% stenosis was prior stent revealing mild in-stent restenosis, distal RCA to lesion 80% stenosed. The patient was referred to CVTS for surgical consultation for CABG.  The patient underwent a four-vessel CABG on 03/29/2019 with LIMA to LAD, SVG to diagonal, SVG to OM, and SVG to PDA. He had endoscopic harvesting of the greater saphenous vein from his right leg. Postoperatively he had thrombocytopenia on Lovenox and this was discontinued, he was also found to have postoperative CHF and was diuresed aggressively. He was followed by nephrology throughout hospital course. He was discharged on 04/03/2019  As a part of evaluation he had a chest x-ray done which showed some loculated large fluid in the left pleura, also echocardiogram being done under some suspicion for endocarditis of the aortic valve. TEE done did not confirm presence of active endocarditis. He did have some calcification of the leaflet of the aortic valve. He was also find to have chronic pleural  effusion on the 02/19/2020 he did have video bronchoscopy done as well as leftVATSwith drainage of empyema as well as decortication and intercostal nerve block. He is chronic renal failure progressively got worse until October when he started hemodialysis. He did have AV fistula placed many months ago and that fistula was ready to be used. Comes today to my office for follow-up.  Overall doing well he said heart is the only thing that seems to be working well.  He is on dialysis 3 times a week.  He told me he does not like it but does not options.  He seems to be happy overall.  He said today he is not on dialysis trying to do as much as he can.  Denies have any cardiac complaint, there is no chest pain tightness squeezing pressure burning chest.   Past Medical History:  Diagnosis Date  . Allergy, unspecified, initial encounter 08/30/2020  . Anaphylactic shock, unspecified, initial encounter 08/30/2020  . Anemia in chronic kidney disease 08/30/2020  . Arthritis   . Back pain 09/07/2017  . Carotid artery stenosis    [id number] LICA, [id number] RICA [id number] [place]  . Chest pain 03/24/2019  . Chronic infective endocarditis   . Chronic kidney disease (CKD), stage IV (severe) ([place]) 12/01/2018  . CKD (chronic kidney disease), stage V ([place]) 03/24/2019  . Coronary artery disease   . Dependence on renal dialysis ([place]) 08/31/2020  . Diabetes mellitus without complication ([place])   . Disorder of phosphorus metabolism, unspecified 08/31/2020  . DM (diabetes mellitus), type 2 with renal complications ([place]) [id number]  .  Dyspnea   . Encounter for screening for respiratory tuberculosis 08/30/2020  . End stage renal disease ([place]) 09/07/2017  . ESRD (end stage renal disease) ([place]) 08/27/2020  . Essential (primary) hypertension 09/07/2017  . Fever   . History of endocarditis 01/09/2020  . Hypercalcemia 10/08/2020  . Hyperlipidemia, unspecified 09/07/2017  . Hypertension   . Iron deficiency anemia, unspecified 08/30/2020  . Low  back pain 09/07/2017  . MI (myocardial infarction) ([place])   . MSSA bacteremia 09/10/2017  . Non-ST elevation (NSTEMI) myocardial infarction ([place]) 01/09/2020  . NSTEMI (non-ST elevated myocardial infarction) ([place]) 03/24/2019  . Other specified arthritis, other site 08/31/2020  . Pain, unspecified 08/30/2020  . Personal history of other diseases of the circulatory system 08/31/2020  . Presence of aortocoronary bypass graft 08/31/2020  . Pruritus, unspecified 08/30/2020  . Renal disorder   . Restless legs syndrome 09/06/2020  . S/P CABG x 4 04/03/2019  . S/P Video Bronchoscopy, Right VATS with drainage of pleural effusion, decortication, intercostal nerve block 02/20/2020  . Secondary hyperparathyroidism of renal origin ([place]) 08/30/2020  . Thoracic aortic aneurysm (TAA) ([facility])    4.3 cm ascending TAA 02/02/20 CT  . Type 2 diabetes mellitus with diabetic nephropathy ([place]) 08/30/2020    Past Surgical History:  Procedure Laterality Date  . ABDOMINAL AORTIC ANEURYSM REPAIR    . APPENDECTOMY    . AV FISTULA PLACEMENT Right 10/18/2018   Procedure: BRACHIO-CEPHALIC ARTERIOVENOUS (AV) FISTULA CREATION;  Surgeon: Angelia Mould, MD;  Location: [facility];  Service: Vascular;  Laterality: Right;  . CARDIAC CATHETERIZATION    . CORONARY ANGIOPLASTY    . CORONARY ARTERY BYPASS GRAFT N/A 03/29/2019   Procedure: CORONARY ARTERY BYPASS GRAFTING (CABG) x 4, using left internal mammary artery and right leg greater saphenous vein harvested endoscopically;  Surgeon: Melrose Nakayama, MD;  Location: [facility];  Service: Open Heart Surgery;  Laterality: N/A;  . CORONARY PRESSURE WIRE/FFR WITH 3D MAPPING N/A 03/27/2019   Procedure: Coronary Pressure Wire/FFR w/3D Mapping;  Surgeon: Jettie Booze, MD;  Location: [facility];  Service: Cardiovascular;  Laterality: N/A;  . DECORTICATION Left 02/19/2020   Procedure: DECORTICATION;  Surgeon: Melrose Nakayama, MD;  Location: [place];  Service: Thoracic;   Laterality: Left;  . INTERCOSTAL NERVE BLOCK Left 02/19/2020   Procedure: Intercostal Nerve Block;  Surgeon: Melrose Nakayama, MD;  Location: [facility];  Service: Thoracic;  Laterality: Left;  . IR FLUORO GUIDE CV LINE RIGHT  09/13/2017  . IR REMOVAL TUN CV CATH W/O FL  10/22/2017  . IR US GUIDE VASC ACCESS RIGHT  09/13/2017  . LEFT HEART CATH AND CORONARY ANGIOGRAPHY N/A 03/27/2019   Procedure: LEFT HEART CATH AND CORONARY ANGIOGRAPHY;  Surgeon: Jettie Booze, MD;  Location: [facility];  Service: Cardiovascular;  Laterality: N/A;  . NECK SURGERY    . PLEURAL EFFUSION DRAINAGE Left 02/19/2020   Procedure: DRAINAGE OF PLEURAL EFFUSION;  Surgeon: Melrose Nakayama, MD;  Location: [facility];  Service: Thoracic;  Laterality: Left;  . SMALL INTESTINE SURGERY    . TEE WITHOUT CARDIOVERSION N/A 09/10/2017   Procedure: TRANSESOPHAGEAL ECHOCARDIOGRAM (TEE);  Surgeon: Skeet Latch, MD;  Location: [facility];  Service: Cardiovascular;  Laterality: N/A;  . TEE WITHOUT CARDIOVERSION N/A 03/29/2019   Procedure: TRANSESOPHAGEAL ECHOCARDIOGRAM (TEE);  Surgeon: Melrose Nakayama, MD;  Location: [facility];  Service: Open Heart Surgery;  Laterality: N/A;  . TEE WITHOUT CARDIOVERSION N/A 01/12/2020   Procedure: TRANSESOPHAGEAL ECHOCARDIOGRAM (TEE);  Surgeon: Sanda Klein, MD;  Location: [facility];  Service: Cardiovascular;  Laterality: N/A;  . VIDEO ASSISTED THORACOSCOPY Left 02/19/2020   Procedure: VIDEO ASSISTED THORACOSCOPY;  Surgeon: Melrose Nakayama, MD;  Location: [facility];  Service: Thoracic;  Laterality: Left;  Marland Kitchen VIDEO BRONCHOSCOPY N/A 02/19/2020   Procedure: VIDEO BRONCHOSCOPY;  Surgeon: Melrose Nakayama, MD;  Location: [facility];  Service: Thoracic;  Laterality: N/A;    Current Medications: Current Meds  Medication Sig  . acetaminophen (TYLENOL) 500 MG tablet Take 1,500 mg by mouth 2 (two) times daily as needed (pain).  Marland Kitchen albuterol (VENTOLIN HFA) 108 (90 Base) MCG/ACT inhaler  TAKE 2 PUFFS BY MOUTH EVERY 6 HOURS AS NEEDED FOR WHEEZE OR SHORTNESS OF BREATH (Patient taking differently: Inhale 2 puffs into the lungs every 6 (six) hours as needed for wheezing or shortness of breath.)  . amLODipine (NORVASC) 5 MG tablet Take 5 mg by mouth daily.  Marland Kitchen aspirin EC 81 MG tablet Take 81 mg by mouth daily.  Marland Kitchen atorvastatin (LIPITOR) 40 MG tablet Take 40 mg by mouth daily in the afternoon.   . Dulaglutide 1.5 MG/0.5ML SOPN Inject 1.5 mg into the skin every Sunday.  . ferrous sulfate 325 (65 FE) MG tablet Take 325 mg by mouth 2 (two) times daily with a meal.  . fluticasone (FLONASE) 50 MCG/ACT nasal spray Place 1 spray into both nostrils daily as needed for allergies or rhinitis.  Marland Kitchen gabapentin (NEURONTIN) 100 MG capsule Take 2 capsules (200 mg total) by mouth at bedtime.  Marland Kitchen glipiZIDE (GLUCOTROL XL) 2.5 MG 24 hr tablet Take 2.5 mg by mouth 2 (two) times daily.  . hydrALAZINE (APRESOLINE) 10 MG tablet TAKE 1 TABLET BY MOUTH THREE TIMES A DAY  . linagliptin (TRADJENTA) 5 MG TABS tablet Take 5 mg by mouth daily.  . metoprolol succinate (TOPROL-XL) 50 MG 24 hr tablet Take 50 mg by mouth daily.  Marland Kitchen OVER THE COUNTER MEDICATION Place 1 drop into both eyes 2 (two) times daily as needed (dry eyes). Hyper Tears eye drops  . rOPINIRole (REQUIP) 0.25 MG tablet Take 0.25 mg by mouth at bedtime.  . sevelamer (RENAGEL) 800 MG tablet Take 1,600 mg by mouth 3 (three) times daily.  . tamsulosin (FLOMAX) 0.4 MG CAPS capsule Take 0.4 mg by mouth daily.  Marland Kitchen zolpidem (AMBIEN) 10 MG tablet Take 10 mg by mouth at bedtime.     Allergies:   Penicillins   Social History   Socioeconomic History  . Marital status: Married    Spouse name: Not on file  . Number of children: Not on file  . Years of education: Not on file  . Highest education level: Not on file  Occupational History  . Not on file  Tobacco Use  . Smoking status: Former Smoker    Packs/day: 2.00    Years: 30.00    Pack years: 60.00     Types: Cigarettes    Start date: 1968    Quit date: 1998    Years since quitting: 24.0  . Smokeless tobacco: Never Used  Vaping Use  . Vaping Use: Never used  Substance and Sexual Activity  . Alcohol use: Yes    Comment: Occasionally.  . Drug use: Never  . Sexual activity: Not on file  Other Topics Concern  . Not on file  Social History Narrative  . Not on file   Social Determinants of Health   Financial Resource Strain: Not on file  Food Insecurity: Not on  file  Transportation Needs: Not on file  Physical Activity: Not on file  Stress: Not on file  Social Connections: Not on file     Family History: The patient's family history includes Cancer in his maternal grandmother. ROS:   Please see the history of present illness.    All 14 point review of systems negative except as described per history of present illness  EKGs/Labs/Other Studies Reviewed:      Recent Labs: 07/24/2020: NT-Pro BNP 20,733 08/26/2020: ALT 16; B Natriuretic Peptide 2,583.5 08/29/2020: BUN 35; Creatinine, Ser 4.37; Hemoglobin 8.4; Platelets 117; Potassium 4.5; Sodium 139  Recent Lipid Panel    Component Value Date/Time   CHOL 86 (L) 01/02/2020 0952   TRIG 41 01/02/2020 0952   HDL 50 01/02/2020 0952   CHOLHDL 1.7 01/02/2020 0952   CHOLHDL 2.5 03/25/2019 0454   VLDL 16 03/25/2019 0454   LDLCALC 24 01/02/2020 0952    Physical Exam:    VS:  BP (!) 142/62 (BP Location: Left Arm, Patient Position: Sitting, Cuff Size: Normal)   Ht 6' (1.829 m)   Wt 204 lb (92.5 kg)   BMI 27.67 kg/m     Wt Readings from Last 3 Encounters:  12/18/20 204 lb (92.5 kg)  08/29/20 206 lb 3.2 oz (93.5 kg)  07/24/20 207 lb (93.9 kg)     GEN:  Well nourished, well developed in no acute distress HEENT: Normal NECK: No JVD; No carotid bruits LYMPHATICS: No lymphadenopathy CARDIAC: RRR, no murmurs, no rubs, no gallops RESPIRATORY:  Clear to auscultation without rales, wheezing or rhonchi  ABDOMEN: Soft,  non-tender, non-distended MUSCULOSKELETAL:  No edema; No deformity  SKIN: Warm and dry LOWER EXTREMITIES: no swelling NEUROLOGIC:  Alert and oriented x 3 PSYCHIATRIC:  Normal affect   ASSESSMENT:    1. S/P CABG x 4   2. S/P Video Bronchoscopy, Right VATS with drainage of pleural effusion, decortication, intercostal nerve block   3. Dependence on renal dialysis ([place])   4. Essential (primary) hypertension    PLAN:    In order of problems listed above:  1. Coronary disease status post coronary artery bypass graft.  Seems to be doing well.  On appropriate medications which I will continue. 2. Status post video bronchoscopy right VATS with drainage of pleural effusion.  Decortication, that is all problem.  He is doing well from that point review. 3. Dialysis: Continue. 4. Essential hypertension blood pressure seems to be reasonably controlled continue present management. 5. Dyslipidemia I did review his K PN which show me LDL of 24 HDL 50 this is however from a year ago.  We will try to make arrangements with dialysis center to have his echo fasting lipid profile done there. 6. Cardiomyopathy with ejection fraction 40 to 45%.  We will make arrangements in the summer to repeat his echocardiogram.  He is on Toprol-XL as well as hydralazine which I will continue.  We will considering augmenting this therapy.   Medication Adjustments/Labs and Tests Ordered: Current medicines are reviewed at length with the patient today.  Concerns regarding medicines are outlined above.  No orders of the defined types were placed in this encounter.  Medication changes: No orders of the defined types were placed in this encounter.   Signed, Park Liter, MD, [facility] 12/18/2020 8:11 AM    [place]

## 2020-12-30 ENCOUNTER — Telehealth: Payer: Self-pay | Admitting: Cardiology

## 2020-12-30 DIAGNOSIS — I214 Non-ST elevation (NSTEMI) myocardial infarction: Secondary | ICD-10-CM

## 2020-12-30 DIAGNOSIS — I10 Essential (primary) hypertension: Secondary | ICD-10-CM

## 2020-12-30 DIAGNOSIS — I6529 Occlusion and stenosis of unspecified carotid artery: Secondary | ICD-10-CM

## 2020-12-30 NOTE — Telephone Encounter (Signed)
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 He is requesting to have an order placed, so he can schedule stress test ASAP.  Please assist.

## 2020-12-30 NOTE — Telephone Encounter (Signed)
I would like to defer this to the patient primary cardiologist since this is not an emergency.

## 2020-12-31 NOTE — Telephone Encounter (Signed)
He has he will need to have [place]

## 2021-01-01 ENCOUNTER — Telehealth: Payer: Self-pay | Admitting: *Deleted

## 2021-01-01 NOTE — Telephone Encounter (Signed)
Called patient informed him that the stress test is ordered went over instructions and let him know I will have schedulers call him. He verbally understood. No further questions.

## 2021-01-01 NOTE — Addendum Note (Signed)
Addended by: Senaida Ores on: 01/01/2021 03:07 PM   Modules accepted: Orders

## 2021-01-01 NOTE — Telephone Encounter (Signed)
Left message on voicemail per DPR in reference to upcoming appointment scheduled on 01/08/21 at 0800 with detailed instructions given per Myocardial Perfusion Study Information Sheet for the test. LM to arrive 15 minutes early, and that it is imperative to arrive on time for appointment to keep from having the test rescheduled. If you need to cancel or reschedule your appointment, please call the office within 24 hours of your appointment. Failure to do so may result in a cancellation of your appointment, and a $50 no show fee. Phone number given for call back for any questions.  No mychart available.John [name], [name]

## 2021-01-01 NOTE — Addendum Note (Signed)
Addended by: Jyl Heinz R on: 01/01/2021 03:40 PM   Modules accepted: Orders

## 2021-01-01 NOTE — Addendum Note (Signed)
Addended by: Senaida Ores on: 01/01/2021 10:08 AM   Modules accepted: Orders

## 2021-01-01 NOTE — Telephone Encounter (Signed)
dun

## 2021-01-08 ENCOUNTER — Other Ambulatory Visit: Payer: Self-pay

## 2021-01-08 ENCOUNTER — Ambulatory Visit (INDEPENDENT_AMBULATORY_CARE_PROVIDER_SITE_OTHER): Payer: Medicare HMO

## 2021-01-08 DIAGNOSIS — I214 Non-ST elevation (NSTEMI) myocardial infarction: Secondary | ICD-10-CM

## 2021-01-08 DIAGNOSIS — I6529 Occlusion and stenosis of unspecified carotid artery: Secondary | ICD-10-CM

## 2021-01-08 DIAGNOSIS — I10 Essential (primary) hypertension: Secondary | ICD-10-CM

## 2021-01-08 LAB — MYOCARDIAL PERFUSION IMAGING
LV dias vol: 228 mL (ref 62–150)
LV sys vol: 141 mL
Peak HR: 94 {beats}/min
Rest HR: 88 {beats}/min
SDS: 2
SRS: 0
SSS: 2
TID: 0.96

## 2021-01-08 MED ORDER — TECHNETIUM TC 99M TETROFOSMIN IV KIT: 10.9000 | PACK | Freq: Once | INTRAVENOUS | Status: DC | PRN

## 2021-01-08 MED ORDER — REGADENOSON 0.4 MG/5ML IV SOLN: 0.4000 mg | Freq: Once | INTRAVENOUS | Status: DC

## 2021-01-08 MED ORDER — TECHNETIUM TC 99M TETROFOSMIN IV KIT: 31.4000 | PACK | Freq: Once | INTRAVENOUS | Status: DC | PRN

## 2021-05-28 ENCOUNTER — Ambulatory Visit: Payer: Medicare HMO | Admitting: Cardiology

## 2021-07-18 DIAGNOSIS — Z8616 Personal history of COVID-19: Secondary | ICD-10-CM | POA: Insufficient documentation

## 2021-08-22 DIAGNOSIS — E119 Type 2 diabetes mellitus without complications: Secondary | ICD-10-CM | POA: Insufficient documentation

## 2021-08-26 ENCOUNTER — Other Ambulatory Visit: Payer: Self-pay

## 2021-08-26 ENCOUNTER — Ambulatory Visit: Payer: Medicare HMO | Admitting: Cardiology

## 2021-08-26 ENCOUNTER — Encounter: Payer: Self-pay | Admitting: Cardiology

## 2021-08-26 VITALS — BP 124/70 | HR 68 | Ht 74.0 in | Wt 206.0 lb

## 2021-08-26 DIAGNOSIS — I10 Essential (primary) hypertension: Secondary | ICD-10-CM | POA: Diagnosis not present

## 2021-08-26 DIAGNOSIS — R0989 Other specified symptoms and signs involving the circulatory and respiratory systems: Secondary | ICD-10-CM

## 2021-08-26 DIAGNOSIS — N289 Disorder of kidney and ureter, unspecified: Secondary | ICD-10-CM

## 2021-08-26 DIAGNOSIS — Z951 Presence of aortocoronary bypass graft: Secondary | ICD-10-CM | POA: Diagnosis not present

## 2021-08-26 DIAGNOSIS — N186 End stage renal disease: Secondary | ICD-10-CM | POA: Diagnosis not present

## 2021-08-26 DIAGNOSIS — R0609 Other forms of dyspnea: Secondary | ICD-10-CM

## 2021-08-26 NOTE — Progress Notes (Signed)
Cardiology Office Note:    Date:  08/26/2021   ID:  SHRISH STATE, DOB 07/12/1951, MRN [id number]  PCP:  Imagene Riches, NP  Cardiologist:  Jenne Campus, MD    Referring MD: Imagene Riches, NP   Chief Complaint  Patient presents with   Follow-up  I am doing well  History of Present Illness:    John [name] is a 70 y.o. male  The patient has a history of coronary artery disease with prior PCI to the RCA, type 2 diabetes, hypertension, PAD status post aortofemoral bypass, end-stage IV chronic kidney disease.  He was found to have elevated troponin, and therefore cardiac catheterization was completed on 03/27/2019.   Cardiac cath revealed severe multivessel disease to include 90% mid RCA lesion, ostial circumflex to proximal circumflex lesion 80% stenosed, proximal LAD to mid LAD 75% stenosed with markedly positive DFR 0.71, first diagonal was 70% stenosed, proximal RCA to mid RCA was 35% stenosed, distal RCA 1 lesion 40% stenosis was prior stent revealing mild in-stent restenosis, distal RCA to lesion 80% stenosed.  The patient was referred to CVTS for surgical consultation for CABG.   The patient underwent a four-vessel CABG on 03/29/2019 with LIMA to LAD, SVG to diagonal, SVG to OM, and SVG to PDA.  He had endoscopic harvesting of the greater saphenous vein from his right leg.  Postoperatively he had thrombocytopenia on Lovenox and this was discontinued, he was also found to have postoperative CHF and was diuresed aggressively.  He was followed by nephrology throughout hospital course.  He was discharged on 04/03/2019   As a part of evaluation he had a chest x-ray done which showed some loculated large fluid in the left pleura, also echocardiogram being done under some suspicion for endocarditis of the aortic valve. TEE done did not confirm presence of active endocarditis.  He did have some calcification of the leaflet of the aortic valve. He was also find to have chronic pleural  effusion on the 02/19/2020 he did have video bronchoscopy done as well as left VATS with drainage of empyema as well as decortication and intercostal nerve block.   He is chronic renal failure progressively got worse until October when he started hemodialysis. He did have AV fistula placed many months ago and that fistula was ready to be used.  He is coming today to my office for follow-up.  Overall he is doing well but he complained the fact that he was little short of breath this morning when he went for his dialysis.  His dialysis has been intensified and he seems to be doing better.  They noticed that his dry weight went up.  Otherwise he complained of being weak tired and exhausted.  Especially days of dialysis.  Past Medical History:  Diagnosis Date   Allergy, unspecified, initial encounter 08/30/2020   Anaphylactic shock, unspecified, initial encounter 08/30/2020   Anemia in chronic kidney disease 08/30/2020   Arthritis    Back pain 09/07/2017   Carotid artery stenosis    123456 LICA, [id number] RICA [id number] [place]   Chest pain 03/24/2019   Chronic infective endocarditis    Chronic kidney disease (CKD), stage IV (severe) ([place]) 12/01/2018   CKD (chronic kidney disease), stage V ([place]) 03/24/2019   Coronary artery disease    Dependence on renal dialysis ([place]) 08/31/2020   Diabetes mellitus without complication ([place])    Disorder of phosphorus metabolism, unspecified 08/31/2020   DM (diabetes mellitus), type 2 with renal complications ([place])  09/07/2017   Dyspnea    Encounter for screening for respiratory tuberculosis 08/30/2020   End stage renal disease ([place]) 09/07/2017   ESRD (end stage renal disease) ([place]) 08/27/2020   Essential (primary) hypertension 09/07/2017   Fever    History of endocarditis 01/09/2020   Hypercalcemia 10/08/2020   Hyperlipidemia, unspecified 09/07/2017   Hypertension    Iron deficiency anemia, unspecified 08/30/2020   Low back pain 09/07/2017   MI (myocardial infarction) ([place])     MSSA bacteremia 09/10/2017   Non-ST elevation (NSTEMI) myocardial infarction ([place]) 01/09/2020   NSTEMI (non-ST elevated myocardial infarction) ([place]) 03/24/2019   Other specified arthritis, other site 08/31/2020   Pain, unspecified 08/30/2020   Personal history of other diseases of the circulatory system 08/31/2020   Presence of aortocoronary bypass graft 08/31/2020   Pruritus, unspecified 08/30/2020   Renal disorder    Restless legs syndrome 09/06/2020   S/P CABG x 4 04/03/2019   S/P Video Bronchoscopy, Right VATS with drainage of pleural effusion, decortication, intercostal nerve block 02/20/2020   Secondary hyperparathyroidism of renal origin ([place]) 08/30/2020   Thoracic aortic aneurysm (TAA)    4.3 cm ascending TAA 02/02/20 CT   Type 2 diabetes mellitus with diabetic nephropathy ([place]) 08/30/2020    Past Surgical History:  Procedure Laterality Date   ABDOMINAL AORTIC ANEURYSM REPAIR     APPENDECTOMY     AV FISTULA PLACEMENT Right 10/18/2018   Procedure: BRACHIO-CEPHALIC ARTERIOVENOUS (AV) FISTULA CREATION;  Surgeon: Angelia Mould, MD;  Location: [facility];  Service: Vascular;  Laterality: Right;   CARDIAC CATHETERIZATION     CORONARY ANGIOPLASTY     CORONARY ARTERY BYPASS GRAFT N/A 03/29/2019   Procedure: CORONARY ARTERY BYPASS GRAFTING (CABG) x 4, using left internal mammary artery and right leg greater saphenous vein harvested endoscopically;  Surgeon: Melrose Nakayama, MD;  Location: [facility];  Service: Open Heart Surgery;  Laterality: N/A;   CORONARY PRESSURE WIRE/FFR WITH 3D MAPPING N/A 03/27/2019   Procedure: Coronary Pressure Wire/FFR w/3D Mapping;  Surgeon: Jettie Booze, MD;  Location: [facility];  Service: Cardiovascular;  Laterality: N/A;   DECORTICATION Left 02/19/2020   Procedure: DECORTICATION;  Surgeon: Melrose Nakayama, MD;  Location: [facility];  Service: Thoracic;  Laterality: Left;   INTERCOSTAL NERVE BLOCK Left 02/19/2020   Procedure: Intercostal Nerve Block;   Surgeon: Melrose Nakayama, MD;  Location: [facility];  Service: Thoracic;  Laterality: Left;   IR FLUORO GUIDE CV LINE RIGHT  09/13/2017   IR REMOVAL TUN CV CATH W/O FL  10/22/2017   IR US GUIDE VASC ACCESS RIGHT  09/13/2017   LEFT HEART CATH AND CORONARY ANGIOGRAPHY N/A 03/27/2019   Procedure: LEFT HEART CATH AND CORONARY ANGIOGRAPHY;  Surgeon: Jettie Booze, MD;  Location: [facility];  Service: Cardiovascular;  Laterality: N/A;   NECK SURGERY     PLEURAL EFFUSION DRAINAGE Left 02/19/2020   Procedure: DRAINAGE OF PLEURAL EFFUSION;  Surgeon: Melrose Nakayama, MD;  Location: [facility];  Service: Thoracic;  Laterality: Left;   SMALL INTESTINE SURGERY     TEE WITHOUT CARDIOVERSION N/A 09/10/2017   Procedure: TRANSESOPHAGEAL ECHOCARDIOGRAM (TEE);  Surgeon: Skeet Latch, MD;  Location: [facility];  Service: Cardiovascular;  Laterality: N/A;   TEE WITHOUT CARDIOVERSION N/A 03/29/2019   Procedure: TRANSESOPHAGEAL ECHOCARDIOGRAM (TEE);  Surgeon: Melrose Nakayama, MD;  Location: [facility];  Service: Open Heart Surgery;  Laterality: N/A;   TEE WITHOUT CARDIOVERSION N/A 01/12/2020   Procedure: TRANSESOPHAGEAL ECHOCARDIOGRAM (  TEE);  Surgeon: Sanda Klein, MD;  Location: [facility];  Service: Cardiovascular;  Laterality: N/A;   VIDEO ASSISTED THORACOSCOPY Left 02/19/2020   Procedure: VIDEO ASSISTED THORACOSCOPY;  Surgeon: Melrose Nakayama, MD;  Location: [facility];  Service: Thoracic;  Laterality: Left;   VIDEO BRONCHOSCOPY N/A 02/19/2020   Procedure: VIDEO BRONCHOSCOPY;  Surgeon: Melrose Nakayama, MD;  [facility];  Service: Thoracic;  Laterality: N/A;    Current Medications: Current Meds  Medication Sig   acetaminophen (TYLENOL) 500 MG tablet Take 1,500 mg by mouth 2 (two) times daily as needed for mild pain or moderate pain (pain).   albuterol (VENTOLIN HFA) 108 (90 Base) MCG/ACT inhaler TAKE 2 PUFFS BY MOUTH EVERY 6 HOURS AS NEEDED FOR WHEEZE OR SHORTNESS OF BREATH  (Patient taking differently: Inhale 2 puffs into the lungs every 6 (six) hours as needed for wheezing or shortness of breath.)   aspirin EC 81 MG tablet Take 81 mg by mouth daily.   atorvastatin (LIPITOR) 40 MG tablet Take 40 mg by mouth daily in the afternoon.    Dulaglutide 1.5 MG/0.5ML SOPN Inject 1.5 mg into the skin every Sunday.   ferrous sulfate 325 (65 FE) MG tablet Take 325 mg by mouth 2 (two) times daily with a meal.   fluticasone (FLONASE) 50 MCG/ACT nasal spray Place 1 spray into both nostrils daily as needed for allergies or rhinitis.   gabapentin (NEURONTIN) 100 MG capsule Take 2 capsules (200 mg total) by mouth at bedtime.   glipiZIDE (GLUCOTROL XL) 2.5 MG 24 hr tablet Take 2.5 mg by mouth 2 (two) times daily.   linagliptin (TRADJENTA) 5 MG TABS tablet Take 5 mg by mouth daily.   OVER THE COUNTER MEDICATION Place 1 drop into both eyes 2 (two) times daily as needed (dry eyes). Hyper Tears eye drops/Unknown strength   rOPINIRole (REQUIP) 0.25 MG tablet Take 0.25 mg by mouth at bedtime.   sevelamer (RENAGEL) 800 MG tablet Take 1,600 mg by mouth 3 (three) times daily.   tamsulosin (FLOMAX) 0.4 MG CAPS capsule Take 0.4 mg by mouth daily.   zolpidem (AMBIEN) 10 MG tablet Take 10 mg by mouth at bedtime.   Current Facility-Administered Medications for the 08/26/21 encounter (Office Visit) with Park Liter, MD  Medication   regadenoson (LEXISCAN) injection SOLN 0.4 mg   technetium tetrofosmin (TC-MYOVIEW) injection [id number] millicurie   technetium tetrofosmin (TC-MYOVIEW) injection [id number] millicurie     Allergies:   Penicillins   Social History   Socioeconomic History   Marital status: Married    Spouse name: Not on file   Number of children: Not on file   Years of education: Not on file   Highest education level: Not on file  Occupational History   Not on file  Tobacco Use   Smoking status: Former    Packs/day: 2.00    Years: 30.00    Pack years: 60.00    Types:  Cigarettes    Start date: 1968    Quit date: 1998    Years since quitting: 24.7   Smokeless tobacco: Never  Vaping Use   Vaping Use: Never used  Substance and Sexual Activity   Alcohol use: Yes    Comment: Occasionally.   Drug use: Never   Sexual activity: Not on file  Other Topics Concern   Not on file  Social History Narrative   Not on file   Social Determinants of Health   Financial Resource Strain: Not on file  Food Insecurity: Not on file  Transportation Needs: Not on file  Physical Activity: Not on file  Stress: Not on file  Social Connections: Not on file     Family History: The patient's family history includes Cancer in his maternal grandmother. ROS:   Please see the history of present illness.    All 14 point review of systems negative except as described per history of present illness  EKGs/Labs/Other Studies Reviewed:      Recent Labs: 08/26/2020: ALT 16; B Natriuretic Peptide 2,583.5 08/29/2020: BUN 35; Creatinine, Ser 4.37; Hemoglobin 8.4; Platelets 117; Potassium 4.5; Sodium 139  Recent Lipid Panel    Component Value Date/Time   CHOL 86 (L) 01/02/2020 0952   TRIG 41 01/02/2020 0952   HDL 50 01/02/2020 0952   CHOLHDL 1.7 01/02/2020 0952   CHOLHDL 2.5 03/25/2019 0454   VLDL 16 03/25/2019 0454   LDLCALC 24 01/02/2020 0952    Physical Exam:    VS:  BP 124/70 (BP Location: Right Arm, Patient Position: Sitting)   Pulse 68   Ht '6\' 2"'$  (1.88 m)   Wt 206 lb (93.4 kg)   SpO2 95%   BMI 26.45 kg/m     Wt Readings from Last 3 Encounters:  08/26/21 206 lb (93.4 kg)  01/08/21 204 lb (92.5 kg)  12/18/20 204 lb (92.5 kg)     GEN:  Well nourished, well developed in no acute distress HEENT: Normal NECK: No JVD; left carotid bruit noted LYMPHATICS: No lymphadenopathy CARDIAC: RRR, no murmurs, no rubs, no gallops RESPIRATORY:  Clear to auscultation without rales, wheezing or rhonchi  ABDOMEN: Soft, non-tender, non-distended MUSCULOSKELETAL:  No edema;  No deformity  SKIN: Warm and dry LOWER EXTREMITIES: no swelling NEUROLOGIC:  Alert and oriented x 3 PSYCHIATRIC:  Normal affect   ASSESSMENT:    1. S/P CABG x 4   2. Renal disorder   3. ESRD (end stage renal disease) ([place])   4. Essential (primary) hypertension   5. Primary hypertension    PLAN:    In order of problems listed above:  Coronary disease status post coronary bypass graft.  Seems to be doing well from that point review and appropriate medication which I will continue.  There were a lot of medication has been discontinued because of his hypotension. Renal disorder/end-stage renal disease he is on dialysis which should continue Essential hypertension blood pressure well controlled continue present management. Dyslipidemia I did review his K PN which show me his data from 2021 with LDL 24 HDL 50.  However he is scheduled to see his primary care physician within the next few weeks and she will do another cholesterol. Left carotic bruit we will schedule him to have carotic ultrasound   Medication Adjustments/Labs and Tests Ordered: Current medicines are reviewed at length with the patient today.  Concerns regarding medicines are outlined above.  No orders of the defined types were placed in this encounter.  Medication changes: No orders of the defined types were placed in this encounter.   Signed, Park Liter, MD, [facility] 08/26/2021 4:01 PM    [facility]

## 2021-08-26 NOTE — Patient Instructions (Signed)
Medication Instructions:  Your physician recommends that you continue on your current medications as directed. Please refer to the Current Medication list given to you today.  *If you need a refill on your cardiac medications before your next appointment, please call your pharmacy*   Lab Work: None If you have labs (blood work) drawn today and your tests are completely normal, you will receive your results only by: [place] (if you have MyChart) OR A paper copy in the mail If you have any lab test that is abnormal or we need to change your treatment, we will call you to review the results.   Testing/Procedures: Your physician has requested that you have an echocardiogram. Echocardiography is a painless test that uses sound waves to create images of your heart. It provides your doctor with information about the size and shape of your heart and how well your heart's chambers and valves are working. This procedure takes approximately one hour. There are no restrictions for this procedure.    Your physician has requested that you have a carotid duplex. This test is an ultrasound of the carotid arteries in your neck. It looks at blood flow through these arteries that supply the brain with blood. Allow one hour for this exam. There are no restrictions or special instructions.    Follow-Up: At [facility], you and your health needs are our priority.  As part of our continuing mission to provide you with exceptional heart care, we have created designated Provider Care Teams.  These Care Teams include your primary Cardiologist (physician) and Advanced Practice Providers (APPs -  Physician Assistants and Nurse Practitioners) who all work together to provide you with the care you need, when you need it.  We recommend signing up for the patient portal called "MyChart".  Sign up information is provided on this After Visit Summary.  MyChart is used to connect with patients for Virtual Visits  (Telemedicine).  Patients are able to view lab/test results, encounter notes, upcoming appointments, etc.  Non-urgent messages can be sent to your provider as well.   To learn more about what you can do with MyChart, go to [email or web address].    Your next appointment:   6 month(s)  The format for your next appointment:   In Person  Provider:   Jenne Campus, MD   Other Instructions

## 2021-09-18 ENCOUNTER — Ambulatory Visit (INDEPENDENT_AMBULATORY_CARE_PROVIDER_SITE_OTHER): Payer: Medicare HMO

## 2021-09-18 ENCOUNTER — Other Ambulatory Visit: Payer: Self-pay

## 2021-09-18 DIAGNOSIS — R0609 Other forms of dyspnea: Secondary | ICD-10-CM | POA: Diagnosis not present

## 2021-09-18 DIAGNOSIS — R0989 Other specified symptoms and signs involving the circulatory and respiratory systems: Secondary | ICD-10-CM

## 2021-09-18 LAB — ECHOCARDIOGRAM COMPLETE
Area-P 1/2: 4.71 cm2
Calc EF: 41.4 %
MV M vel: 5.24 m/s
MV Peak grad: 109.8 mmHg
Radius: 0.4 cm
S' Lateral: 5.4 cm
Single Plane A2C EF: 37.3 %
Single Plane A4C EF: 44 %

## 2021-09-24 ENCOUNTER — Telehealth: Payer: Self-pay | Admitting: Emergency Medicine

## 2021-09-24 ENCOUNTER — Telehealth: Payer: Self-pay | Admitting: Cardiology

## 2021-09-24 NOTE — Telephone Encounter (Signed)
John [name] is returning John [name]'s call from yesterday for his echo results. He is requesting a VM with the results and recommendations if he does not answer and he will callback with questions.

## 2021-09-24 NOTE — Telephone Encounter (Signed)
-----   Message from Park Liter, MD sent at 09/23/2021  2:25 PM EDT ----- Ejection fraction seems to be slightly worse than before it was 4045%, this time 35 to 40%, aorta measuring 46 mm, only medical therapy, please start hydralazine 10 mg 3 times daily for his diminished ejection fraction

## 2021-09-24 NOTE — Telephone Encounter (Signed)
Patient informed of results.He is reluctant to start hydralzine 10 mg 3 times daily due to blood pressure dropping during dialysis he was taken off by kidney doctors.

## 2021-09-25 ENCOUNTER — Telehealth: Payer: Self-pay

## 2021-09-25 ENCOUNTER — Other Ambulatory Visit: Payer: Self-pay

## 2021-09-25 NOTE — Telephone Encounter (Signed)
-----   Message from Park Liter, MD sent at 09/23/2021  2:25 PM EDT ----- Ejection fraction seems to be slightly worse than before it was 4045%, this time 35 to 40%, aorta measuring 46 mm, only medical therapy, please start hydralazine 10 mg 3 times daily for his diminished ejection fraction

## 2021-09-25 NOTE — Telephone Encounter (Signed)
Tried to call patient back no answer.

## 2021-09-26 NOTE — Telephone Encounter (Signed)
Left message for patient to return call.

## 2021-09-29 NOTE — Telephone Encounter (Signed)
Patient informed we are ok to not start hydralazine for now. He understood.

## 2021-11-13 DIAGNOSIS — E876 Hypokalemia: Secondary | ICD-10-CM | POA: Insufficient documentation

## 2021-11-13 HISTORY — DX: Hypokalemia: E87.6

## 2021-12-02 ENCOUNTER — Telehealth: Payer: Self-pay | Admitting: Cardiology

## 2021-12-02 DIAGNOSIS — I214 Non-ST elevation (NSTEMI) myocardial infarction: Secondary | ICD-10-CM

## 2021-12-02 NOTE — Telephone Encounter (Signed)
Patient would like a stress test done.  He has a DOT physical coming up next month.  He had to do one last year, so he is not sure if he needs one this year.

## 2021-12-04 ENCOUNTER — Telehealth (HOSPITAL_COMMUNITY): Payer: Self-pay

## 2021-12-04 NOTE — Telephone Encounter (Signed)
Gave patient instructions for MPI on 12/10/21.

## 2021-12-04 NOTE — Telephone Encounter (Signed)
Patient calling back to follow up.

## 2021-12-10 ENCOUNTER — Other Ambulatory Visit: Payer: Self-pay

## 2021-12-10 ENCOUNTER — Ambulatory Visit (INDEPENDENT_AMBULATORY_CARE_PROVIDER_SITE_OTHER): Payer: Medicare HMO

## 2021-12-10 DIAGNOSIS — I214 Non-ST elevation (NSTEMI) myocardial infarction: Secondary | ICD-10-CM | POA: Diagnosis not present

## 2021-12-10 LAB — MYOCARDIAL PERFUSION IMAGING
LV dias vol: 222 mL (ref 62–150)
LV sys vol: 154 mL
Nuc Stress EF: 30 %
Peak HR: 92 {beats}/min
Rest HR: 89 {beats}/min
Rest Nuclear Isotope Dose: 10.8 mCi
SDS: 2
SRS: 6
SSS: 8
Stress Nuclear Isotope Dose: 31.8 mCi
TID: 1.03

## 2021-12-10 MED ORDER — TECHNETIUM TC 99M TETROFOSMIN IV KIT
31.8000 | PACK | Freq: Once | INTRAVENOUS | Status: AC | PRN
  Administered 2021-12-10: 31.8 via INTRAVENOUS

## 2021-12-10 MED ORDER — REGADENOSON 0.4 MG/5ML IV SOLN
0.4000 mg | Freq: Once | INTRAVENOUS | Status: AC
  Administered 2021-12-10: 0.4 mg via INTRAVENOUS

## 2021-12-10 MED ORDER — TECHNETIUM TC 99M TETROFOSMIN IV KIT
10.8000 | PACK | Freq: Once | INTRAVENOUS | Status: AC | PRN
  Administered 2021-12-10: 10.8 via INTRAVENOUS

## 2021-12-15 ENCOUNTER — Telehealth: Payer: Self-pay | Admitting: Cardiology

## 2021-12-15 NOTE — Telephone Encounter (Signed)
Patient informed of results.  

## 2021-12-15 NOTE — Telephone Encounter (Signed)
Patient calling back for results

## 2022-01-06 ENCOUNTER — Telehealth: Payer: Self-pay | Admitting: Cardiology

## 2022-01-06 NOTE — Telephone Encounter (Signed)
Patient states he needs to be seen by the end of this month.  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  He can only come in on a Tuesday or Thursday afternoon.

## 2022-01-06 NOTE — Telephone Encounter (Signed)
Called patient and scheduled appointment for 2:00 pm on 01/20/22. Patient made aware and has no further questions at this time.

## 2022-01-12 ENCOUNTER — Other Ambulatory Visit: Payer: Self-pay

## 2022-01-12 DIAGNOSIS — N186 End stage renal disease: Secondary | ICD-10-CM

## 2022-01-15 NOTE — Progress Notes (Signed)
Office Note     CC: Right brachiocephalic AV fistula concern Requesting Provider:  Imagene Riches, NP  HPI: John [name] is a 71 y.o. (April 25, 1951) male presenting at the request of .Heide Scales F, NP for skin thinning over an aneurysmal portion of his right brachiocephalic AV fistula.  The AV fistula was placed in November 2019, and has been functioning well since that time.  On exam today, John [name] was doing well.  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  Current dialysis days Tuesday Thursday Saturday.  At dialysis, there was some concerning of skin thinning, no ulceration, no prolonged bleeding.  John [name] has a cold hand during dialysis, but denies symptoms of steal syndrome.  The pt is  on a statin for cholesterol management.  The pt is  on a daily aspirin.   Other AC:  - The pt is  on medication for hypertension.   The pt is  diabetic.  Tobacco hx:  -  Past Medical History:  Diagnosis Date   Allergy, unspecified, initial encounter 08/30/2020   Anaphylactic shock, unspecified, initial encounter 08/30/2020   Anemia in chronic kidney disease 08/30/2020   Arthritis    Back pain 09/07/2017   Carotid artery stenosis    32-99% LICA, 2-42% RICA 04/30/33 [place]   Chest pain 03/24/2019   Chronic infective endocarditis    Chronic kidney disease (CKD), stage IV (severe) ([place]) 12/01/2018   CKD (chronic kidney disease), stage V ([place]) 03/24/2019   Coronary artery disease    Dependence on renal dialysis ([place]) 08/31/2020   Diabetes mellitus without complication (HCC)    Disorder of phosphorus metabolism, unspecified 08/31/2020   DM (diabetes mellitus), type 2 with renal complications ([place]) 19/62/2297   Dyspnea    Encounter for screening for respiratory tuberculosis 08/30/2020   End stage renal disease ([place]) 09/07/2017   ESRD (end stage renal disease) ([place]) 08/27/2020   Essential (primary) hypertension 09/07/2017   Fever    History of endocarditis  01/09/2020   Hypercalcemia 10/08/2020   Hyperlipidemia, unspecified 09/07/2017   Hypertension    Iron deficiency anemia, unspecified 08/30/2020   Low back pain 09/07/2017   MI (myocardial infarction) ([place])    MSSA bacteremia 09/10/2017   Non-ST elevation (NSTEMI) myocardial infarction ([place]) 01/09/2020   NSTEMI (non-ST elevated myocardial infarction) ([place]) 03/24/2019   Other specified arthritis, other site 08/31/2020   Pain, unspecified 08/30/2020   Personal history of other diseases of the circulatory system 08/31/2020   Presence of aortocoronary bypass graft 08/31/2020   Pruritus, unspecified 08/30/2020   Renal disorder    Restless legs syndrome 09/06/2020   S/P CABG x 4 04/03/2019   S/P Video Bronchoscopy, Right VATS with drainage of pleural effusion, decortication, intercostal nerve block 02/20/2020   Secondary hyperparathyroidism of renal origin ([place]) 08/30/2020   Thoracic aortic aneurysm (TAA)    4.3 cm ascending TAA 02/02/20 CT   Type 2 diabetes mellitus with diabetic nephropathy ([place]) 08/30/2020    Past Surgical History:  Procedure Laterality Date   ABDOMINAL AORTIC ANEURYSM REPAIR     APPENDECTOMY     AV FISTULA PLACEMENT Right 10/18/2018   Procedure: BRACHIO-CEPHALIC ARTERIOVENOUS (AV) FISTULA CREATION;  Surgeon: Angelia Mould, MD;  Location: [facility];  Service: Vascular;  Laterality: Right;   CARDIAC CATHETERIZATION     CORONARY ANGIOPLASTY     CORONARY ARTERY BYPASS GRAFT N/A 03/29/2019   Procedure: CORONARY ARTERY BYPASS GRAFTING (CABG) x 4, using  left internal mammary artery and right leg greater saphenous vein harvested endoscopically;  Surgeon: Melrose Nakayama, MD;  Location: [place];  Service: Open Heart Surgery;  Laterality: N/A;   CORONARY PRESSURE WIRE/FFR WITH 3D MAPPING N/A 03/27/2019   Procedure: Coronary Pressure Wire/FFR w/3D Mapping;  Surgeon: Jettie Booze, MD;  Location: [facility];  Service: Cardiovascular;  Laterality: N/A;   DECORTICATION Left  02/19/2020   Procedure: DECORTICATION;  Surgeon: Melrose Nakayama, MD;  Location: [facility];  Service: Thoracic;  Laterality: Left;   INTERCOSTAL NERVE BLOCK Left 02/19/2020   Procedure: Intercostal Nerve Block;  Surgeon: Melrose Nakayama, MD;  Location: [facility];  Service: Thoracic;  Laterality: Left;   IR FLUORO GUIDE CV LINE RIGHT  09/13/2017   IR REMOVAL TUN CV CATH W/O FL  10/22/2017   IR US GUIDE VASC ACCESS RIGHT  09/13/2017   LEFT HEART CATH AND CORONARY ANGIOGRAPHY N/A 03/27/2019   Procedure: LEFT HEART CATH AND CORONARY ANGIOGRAPHY;  Surgeon: Jettie Booze, MD;  Location: [facility];  Service: Cardiovascular;  Laterality: N/A;   NECK SURGERY     PLEURAL EFFUSION DRAINAGE Left 02/19/2020   Procedure: DRAINAGE OF PLEURAL EFFUSION;  Surgeon: Melrose Nakayama, MD;  Location: [facility];  Service: Thoracic;  Laterality: Left;   SMALL INTESTINE SURGERY     TEE WITHOUT CARDIOVERSION N/A 09/10/2017   Procedure: TRANSESOPHAGEAL ECHOCARDIOGRAM (TEE);  Surgeon: Skeet Latch, MD;  Location: [place] [facility];  Service: Cardiovascular;  Laterality: N/A;   TEE WITHOUT CARDIOVERSION N/A 03/29/2019   Procedure: TRANSESOPHAGEAL ECHOCARDIOGRAM (TEE);  Surgeon: Melrose Nakayama, MD;  Location: [place];  Service: Open Heart Surgery;  Laterality: N/A;   TEE WITHOUT CARDIOVERSION N/A 01/12/2020   Procedure: TRANSESOPHAGEAL ECHOCARDIOGRAM (TEE);  Surgeon: Sanda Klein, MD;  Location: [facility];  Service: Cardiovascular;  Laterality: N/A;   VIDEO ASSISTED THORACOSCOPY Left 02/19/2020   Procedure: VIDEO ASSISTED THORACOSCOPY;  Surgeon: Melrose Nakayama, MD;  Location: [place];  Service: Thoracic;  Laterality: Left;   VIDEO BRONCHOSCOPY N/A 02/19/2020   Procedure: VIDEO BRONCHOSCOPY;  Surgeon: Melrose Nakayama, MD;  Location: [facility];  Service: Thoracic;  Laterality: N/A;    Social History   Socioeconomic History   Marital status: Married    Spouse name: Not on file   Number of  children: Not on file   Years of education: Not on file   Highest education level: Not on file  Occupational History   Not on file  Tobacco Use   Smoking status: Former    Packs/day: 2.00    Years: 30.00    Pack years: 60.00    Types: Cigarettes    Start date: 1968    Quit date: 1998    Years since quitting: 25.1   Smokeless tobacco: Never  Vaping Use   Vaping Use: Never used  Substance and Sexual Activity   Alcohol use: Yes    Comment: Occasionally.   Drug use: Never   Sexual activity: Not on file  Other Topics Concern   Not on file  Social History Narrative   Not on file   Social Determinants of Health   Financial Resource Strain: Not on file  Food Insecurity: Not on file  Transportation Needs: Not on file  Physical Activity: Not on file  Stress: Not on file  Social Connections: Not on file  Intimate Partner Violence: Not on file    Family History  Problem Relation Age of Onset  Cancer Maternal Grandmother     Current Outpatient Medications  Medication Sig Dispense Refill   acetaminophen (TYLENOL) 500 MG tablet Take 1,500 mg by mouth 2 (two) times daily as needed for mild pain or moderate pain (pain).     albuterol (VENTOLIN HFA) 108 (90 Base) MCG/ACT inhaler TAKE 2 PUFFS BY MOUTH EVERY 6 HOURS AS NEEDED FOR WHEEZE OR SHORTNESS OF BREATH (Patient taking differently: Inhale 2 puffs into the lungs every 6 (six) hours as needed for wheezing or shortness of breath.) 6.7 g 3   aspirin EC 81 MG tablet Take 81 mg by mouth daily.     atorvastatin (LIPITOR) 40 MG tablet Take 40 mg by mouth daily in the afternoon.      Dulaglutide 1.5 MG/0.5ML SOPN Inject 1.5 mg into the skin every Sunday.     ferrous sulfate 325 (65 FE) MG tablet Take 325 mg by mouth 2 (two) times daily with a meal.     fluticasone (FLONASE) 50 MCG/ACT nasal spray Place 1 spray into both nostrils daily as needed for allergies or rhinitis.     gabapentin (NEURONTIN) 100 MG capsule Take 2 capsules (200 mg  total) by mouth at bedtime.     glipiZIDE (GLUCOTROL XL) 2.5 MG 24 hr tablet Take 2.5 mg by mouth 2 (two) times daily.     linagliptin (TRADJENTA) 5 MG TABS tablet Take 5 mg by mouth daily.     OVER THE COUNTER MEDICATION Place 1 drop into both eyes 2 (two) times daily as needed (dry eyes). Hyper Tears eye drops/Unknown strength     rOPINIRole (REQUIP) 0.25 MG tablet Take 0.25 mg by mouth at bedtime.     sevelamer (RENAGEL) 800 MG tablet Take 1,600 mg by mouth 3 (three) times daily.     tamsulosin (FLOMAX) 0.4 MG CAPS capsule Take 0.4 mg by mouth daily.     zolpidem (AMBIEN) 10 MG tablet Take 10 mg by mouth at bedtime.     Current Facility-Administered Medications  Medication Dose Route Frequency Provider Last Rate Last Admin   regadenoson (LEXISCAN) injection SOLN 0.4 mg  0.4 mg Intravenous Once Revankar, Reita Cliche, MD       technetium tetrofosmin (TC-MYOVIEW) injection 30.1 millicurie  60.1 millicurie Intravenous Once PRN Revankar, Reita Cliche, MD       technetium tetrofosmin (TC-MYOVIEW) injection 09.3 millicurie  23.5 millicurie Intravenous Once PRN Revankar, Reita Cliche, MD        Allergies  Allergen Reactions   Penicillins Rash and Other (See Comments)    Did it involve swelling of the face/tongue/throat, SOB, or low BP? No Did it involve sudden or severe rash/hives, skin peeling, or any reaction on the inside of your mouth or nose? Yes Did you need to seek medical attention at a hospital or doctor's office? Yes When did it last happen?      Childhood allergy If all above answers are NO, may proceed with cephalosporin use.      REVIEW OF SYSTEMS:   [X]  denotes positive finding, [ ]  denotes negative finding Cardiac  Comments:  Chest pain or chest pressure:    Shortness of breath upon exertion:    Short of breath when lying flat:    Irregular heart rhythm:        Vascular    Pain in calf, thigh, or hip brought on by ambulation:    Pain in feet at night that wakes you up from your  sleep:     Blood clot  in your veins:    Leg swelling:         Pulmonary    Oxygen at home:    Productive cough:     Wheezing:         Neurologic    Sudden weakness in arms or legs:     Sudden numbness in arms or legs:     Sudden onset of difficulty speaking or slurred speech:    Temporary loss of vision in one eye:     Problems with dizziness:         Gastrointestinal    Blood in stool:     Vomited blood:         Genitourinary    Burning when urinating:     Blood in urine:        Psychiatric    Major depression:         Hematologic    Bleeding problems:    Problems with blood clotting too easily:        Skin    Rashes or ulcers:        Constitutional    Fever or chills:      PHYSICAL EXAMINATION:  There were no vitals filed for this visit.  General:  WDWN in NAD; vital signs documented above Gait: Not observed HENT: WNL, normocephalic Pulmonary: normal non-labored breathing , without wheezing Cardiac: regular HR, Abdomen: soft, NT, no masses Skin: without rashes Vascular Exam/Pulses:  Right Left  Radial 1+ (weak) 2+ (normal)  Ulnar trace 1+ (weak)                   Extremities: without ischemic changes, without Gangrene , without cellulitis; without open wounds;  Right-sided AV fistula with palpable thrill, ecchymosis on the upper arm at previous access site Musculoskeletal: no muscle wasting or atrophy  Neurologic: A&O X 3;  No focal weakness or paresthesias are detected Psychiatric:  The pt has Normal affect.   Non-Invasive Vascular Imaging:    Summary:  Patent AVF.  Tortuous in the ACF.  Elevated velocity in the proximal upper arm at an area of narrowing.  Scarring of the vein wall in the distal upper arm.  No branching visualized.     ASSESSMENT/PLAN: John [name] is a 71 y.o. male presenting with concern for skin thinning over right brachiocephalic fistula.  On exam, there is healthy dermis over the fistula.  No ulcerations, no  open wounds.  Palpable thrill, minimal pulsatility.  Ultrasound study demonstrates some level of stenosis in the mid fistula, but Zaccheaus denies prolonged bleeding at dialysis or issues with dialysis runs.  At this time, recommend continue dialysis through the fistula.  Should prolonged bleeding events occur, I would be happy to see Kadyn back for right upper extremity fistulogram.   Broadus John, MD [facility] [phone]

## 2022-01-16 ENCOUNTER — Ambulatory Visit: Payer: Medicare HMO | Admitting: Vascular Surgery

## 2022-01-16 ENCOUNTER — Ambulatory Visit (HOSPITAL_COMMUNITY)
Admission: RE | Admit: 2022-01-16 | Discharge: 2022-01-16 | Disposition: A | Discharge status: Home / Self Care | Payer: Medicare HMO | Source: Ambulatory Visit | Attending: Vascular Surgery | Admitting: Vascular Surgery

## 2022-01-16 ENCOUNTER — Other Ambulatory Visit: Payer: Self-pay

## 2022-01-16 ENCOUNTER — Encounter: Payer: Self-pay | Admitting: Vascular Surgery

## 2022-01-16 VITALS — BP 127/75 | HR 88 | Temp 98.0°F | Resp 20 | Ht 74.0 in | Wt 204.0 lb

## 2022-01-16 DIAGNOSIS — N186 End stage renal disease: Secondary | ICD-10-CM | POA: Insufficient documentation

## 2022-01-16 DIAGNOSIS — Z992 Dependence on renal dialysis: Secondary | ICD-10-CM

## 2022-01-19 ENCOUNTER — Other Ambulatory Visit: Payer: Self-pay

## 2022-01-20 ENCOUNTER — Other Ambulatory Visit: Payer: Self-pay

## 2022-01-20 ENCOUNTER — Ambulatory Visit: Payer: Medicare HMO | Admitting: Cardiology

## 2022-01-20 ENCOUNTER — Encounter: Payer: Self-pay | Admitting: Cardiology

## 2022-01-20 VITALS — BP 126/68 | HR 91 | Ht 74.0 in | Wt 202.8 lb

## 2022-01-20 DIAGNOSIS — I6523 Occlusion and stenosis of bilateral carotid arteries: Secondary | ICD-10-CM

## 2022-01-20 DIAGNOSIS — I10 Essential (primary) hypertension: Secondary | ICD-10-CM | POA: Diagnosis not present

## 2022-01-20 DIAGNOSIS — I429 Cardiomyopathy, unspecified: Secondary | ICD-10-CM | POA: Insufficient documentation

## 2022-01-20 DIAGNOSIS — I255 Ischemic cardiomyopathy: Secondary | ICD-10-CM | POA: Diagnosis not present

## 2022-01-20 DIAGNOSIS — N186 End stage renal disease: Secondary | ICD-10-CM

## 2022-01-20 MED ORDER — CARVEDILOL 3.125 MG PO TABS: 3.1250 mg | ORAL_TABLET | Freq: Two times a day (BID) | ORAL | 3 refills | Status: DC

## 2022-01-20 NOTE — Patient Instructions (Signed)
Medication Instructions:  Your physician has recommended you make the following change in your medication:  START: Carvedilol 3.125 mg twice daily  *If you need a refill on your cardiac medications before your next appointment, please call your pharmacy*   Lab Work: None If you have labs (blood work) drawn today and your tests are completely normal, you will receive your results only by: [place] (if you have MyChart) OR A paper copy in the mail If you have any lab test that is abnormal or we need to change your treatment, we will call you to review the results.   Testing/Procedures: Your physician has requested that you have an echocardiogram. Echocardiography is a painless test that uses sound waves to create images of your heart. It provides your doctor with information about the size and shape of your heart and how well your hearts chambers and valves are working. This procedure takes approximately one hour. There are no restrictions for this procedure.    Follow-Up: At [facility], you and your health needs are our priority.  As part of our continuing mission to provide you with exceptional heart care, we have created designated Provider Care Teams.  These Care Teams include your primary Cardiologist (physician) and Advanced Practice Providers (APPs -  Physician Assistants and Nurse Practitioners) who all work together to provide you with the care you need, when you need it.  We recommend signing up for the patient portal called "MyChart".  Sign up information is provided on this After Visit Summary.  MyChart is used to connect with patients for Virtual Visits (Telemedicine).  Patients are able to view lab/test results, encounter notes, upcoming appointments, etc.  Non-urgent messages can be sent to your provider as well.   To learn more about what you can do with MyChart, go to [email or web address].    Your next appointment:   3 month(s)  The format for your next  appointment:   In Person  Provider:   Jenne Campus, MD    Other Instructions None

## 2022-01-20 NOTE — Addendum Note (Signed)
Addended by: Edwyna Shell I on: 01/20/2022 02:54 PM   Modules accepted: Orders

## 2022-01-20 NOTE — Addendum Note (Signed)
Addended by: Anselm Pancoast on: 01/20/2022 03:54 PM   Modules accepted: Orders

## 2022-01-20 NOTE — Progress Notes (Signed)
Cardiology Office Note:    Date:  01/20/2022   ID:  MIR FULLILOVE, DOB February 17, 1951, MRN [id number]  PCP:  Imagene Riches, NP  Cardiologist:  Jenne Campus, MD    Referring MD: Imagene Riches, NP   Chief Complaint  Patient presents with   Non-stress Test    History of Present Illness:    John [name] is a 71 y.o. male  The patient has a history of coronary artery disease with prior PCI to the RCA, type 2 diabetes, hypertension, PAD status post aortofemoral bypass, end-stage IV chronic kidney disease.  He was found to have elevated troponin, and therefore cardiac catheterization was completed on 03/27/2019.   Cardiac cath revealed severe multivessel disease to include 90% mid RCA lesion, ostial circumflex to proximal circumflex lesion 80% stenosed, proximal LAD to mid LAD 75% stenosed with markedly positive DFR 0.71, first diagonal was 70% stenosed, proximal RCA to mid RCA was 35% stenosed, distal RCA 1 lesion 40% stenosis was prior stent revealing mild in-stent restenosis, distal RCA to lesion 80% stenosed.  The patient was referred to CVTS for surgical consultation for CABG.   The patient underwent a four-vessel CABG on 03/29/2019 with LIMA to LAD, SVG to diagonal, SVG to OM, and SVG to PDA.  He had endoscopic harvesting of the greater saphenous vein from his right leg.  Postoperatively he had thrombocytopenia on Lovenox and this was discontinued, he was also found to have postoperative CHF and was diuresed aggressively.  He was followed by nephrology throughout hospital course.  He was discharged on 04/03/2019   As a part of evaluation he had a chest x-ray done which showed some loculated large fluid in the left pleura, also echocardiogram being done under some suspicion for endocarditis of the aortic valve. TEE done did not confirm presence of active endocarditis.  He did have some calcification of the leaflet of the aortic valve. He was also find to have chronic pleural effusion on  the 02/19/2020 he did have video bronchoscopy done as well as left VATS with drainage of empyema as well as decortication and intercostal nerve block.   He is chronic renal failure progressively got worse until October when he started hemodialysis. He did have AV fistula placed many months ago and that fistula was ready to be used. Recently he started having some shortness of breath, echocardiogram has been performed which showed diminished left ventricle ejection fraction 35 to 40%, after that stress test was done to look for any a evidence of ischemia which was negative.  He is coming here to talk about his overall he is doing better still works.  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  He denies have any chest pain tightness squeezing pressure burning chest.  Past Medical History:  Diagnosis Date   Allergy, unspecified, initial encounter 08/30/2020   Anaphylactic shock, unspecified, initial encounter 08/30/2020   Anemia in chronic kidney disease 08/30/2020   Arthritis    Back pain 09/07/2017   Carotid artery stenosis    78-29% LICA, 5-62% RICA 11/25/06 [place]   Chest pain 03/24/2019   Chronic infective endocarditis    Chronic kidney disease (CKD), stage IV (severe) ([place]) 12/01/2018   CKD (chronic kidney disease), stage V ([place]) 03/24/2019   Coronary artery disease    Dependence on renal dialysis ([place]) 08/31/2020   Diabetes mellitus without complication ([place])    Disorder of phosphorus metabolism, unspecified 08/31/2020   DM (  diabetes mellitus), type 2 with renal complications ([place]) 40/37/0964   Dyspnea    Encounter for screening for respiratory tuberculosis 08/30/2020   End stage renal disease ([place]) 09/07/2017   ESRD (end stage renal disease) ([place]) 08/27/2020   Essential (primary) hypertension 09/07/2017   Fever    History of endocarditis 01/09/2020   Hypercalcemia 10/08/2020   Hyperlipidemia, unspecified 09/07/2017   Hypertension     Hypokalemia 11/13/2021   Iron deficiency anemia, unspecified 08/30/2020   Low back pain 09/07/2017   MI (myocardial infarction) ([place])    MSSA bacteremia 09/10/2017   Non-ST elevation (NSTEMI) myocardial infarction ([place]) 01/09/2020   NSTEMI (non-ST elevated myocardial infarction) ([place]) 03/24/2019   Other specified arthritis, other site 08/31/2020   Pain, unspecified 08/30/2020   Personal history of other diseases of the circulatory system 08/31/2020   Presence of aortocoronary bypass graft 08/31/2020   Pruritus, unspecified 08/30/2020   Renal disorder    Restless legs syndrome 09/06/2020   S/P CABG x 4 04/03/2019   S/P Video Bronchoscopy, Right VATS with drainage of pleural effusion, decortication, intercostal nerve block 02/20/2020   Secondary hyperparathyroidism of renal origin ([place]) 08/30/2020   Thoracic aortic aneurysm (TAA)    4.3 cm ascending TAA 02/02/20 CT   Type 2 diabetes mellitus with diabetic nephropathy ([place]) 08/30/2020    Past Surgical History:  Procedure Laterality Date   ABDOMINAL AORTIC ANEURYSM REPAIR     APPENDECTOMY     AV FISTULA PLACEMENT Right 10/18/2018   Procedure: BRACHIO-CEPHALIC ARTERIOVENOUS (AV) FISTULA CREATION;  Surgeon: Angelia Mould, MD;  Location: [facility];  Service: Vascular;  Laterality: Right;   CARDIAC CATHETERIZATION     CORONARY ANGIOPLASTY     CORONARY ARTERY BYPASS GRAFT N/A 03/29/2019   Procedure: CORONARY ARTERY BYPASS GRAFTING (CABG) x 4, using left internal mammary artery and right leg greater saphenous vein harvested endoscopically;  Surgeon: Melrose Nakayama, MD;  Location: [facility];  Service: Open Heart Surgery;  Laterality: N/A;   CORONARY PRESSURE WIRE/FFR WITH 3D MAPPING N/A 03/27/2019   Procedure: Coronary Pressure Wire/FFR w/3D Mapping;  Surgeon: Jettie Booze, MD;  Location: [facility];  Service: Cardiovascular;  Laterality: N/A;   DECORTICATION Left 02/19/2020   Procedure: DECORTICATION;  Surgeon: Melrose Nakayama,  MD;  Location: [facility];  Service: Thoracic;  Laterality: Left;   INTERCOSTAL NERVE BLOCK Left 02/19/2020   Procedure: Intercostal Nerve Block;  Surgeon: Melrose Nakayama, MD;  Location: [facility];  Service: Thoracic;  Laterality: Left;   IR FLUORO GUIDE CV LINE RIGHT  09/13/2017   IR REMOVAL TUN CV CATH W/O FL  10/22/2017   IR US GUIDE VASC ACCESS RIGHT  09/13/2017   LEFT HEART CATH AND CORONARY ANGIOGRAPHY N/A 03/27/2019   Procedure: LEFT HEART CATH AND CORONARY ANGIOGRAPHY;  Surgeon: Jettie Booze, MD;  Location: [facility];  Service: Cardiovascular;  Laterality: N/A;   NECK SURGERY     PLEURAL EFFUSION DRAINAGE Left 02/19/2020   Procedure: DRAINAGE OF PLEURAL EFFUSION;  Surgeon: Melrose Nakayama, MD;  Location: [facility];  Service: Thoracic;  Laterality: Left;   SMALL INTESTINE SURGERY     TEE WITHOUT CARDIOVERSION N/A 09/10/2017   Procedure: TRANSESOPHAGEAL ECHOCARDIOGRAM (TEE);  Surgeon: Skeet Latch, MD;  Location: [facility];  Service: Cardiovascular;  Laterality: N/A;   TEE WITHOUT CARDIOVERSION N/A 03/29/2019   Procedure: TRANSESOPHAGEAL ECHOCARDIOGRAM (TEE);  Surgeon: Melrose Nakayama, MD;  Location: [facility];  Service: Open Heart Surgery;  Laterality: N/A;  TEE WITHOUT CARDIOVERSION N/A 01/12/2020   Procedure: TRANSESOPHAGEAL ECHOCARDIOGRAM (TEE);  Surgeon: Sanda Klein, MD;  Location: [facility];  Service: Cardiovascular;  Laterality: N/A;   VIDEO ASSISTED THORACOSCOPY Left 02/19/2020   Procedure: VIDEO ASSISTED THORACOSCOPY;  Surgeon: Melrose Nakayama, MD;  Location: [facility];  Service: Thoracic;  Laterality: Left;   VIDEO BRONCHOSCOPY N/A 02/19/2020   Procedure: VIDEO BRONCHOSCOPY;  Surgeon: Melrose Nakayama, MD;  Location: [facility];  Service: Thoracic;  Laterality: N/A;    Current Medications: Current Meds  Medication Sig   acetaminophen (TYLENOL) 500 MG tablet Take 1,500 mg by mouth 2 (two) times daily as needed for mild pain or moderate pain  (pain).   albuterol (VENTOLIN HFA) 108 (90 Base) MCG/ACT inhaler TAKE 2 PUFFS BY MOUTH EVERY 6 HOURS AS NEEDED FOR WHEEZE OR SHORTNESS OF BREATH (Patient taking differently: Inhale 2 puffs into the lungs every 6 (six) hours as needed for wheezing or shortness of breath.)   aspirin EC 81 MG tablet Take 81 mg by mouth daily.   atorvastatin (LIPITOR) 40 MG tablet Take 40 mg by mouth daily in the afternoon.    AURYXIA 1 GM 210 MG(Fe) tablet Take 210 mg by mouth 3 (three) times daily.   b complex-vitamin c-folic acid (NEPHRO-VITE) 0.8 MG TABS tablet SMARTSIG:1 Tablet(s) By Mouth Every Evening   Dulaglutide 1.5 MG/0.5ML SOPN Inject 1.5 mg into the skin every Sunday.   ferric citrate (AURYXIA) 1 GM 210 MG(Fe) tablet Take by mouth.   ferrous sulfate 325 (65 FE) MG tablet Take 325 mg by mouth 2 (two) times daily with a meal.   fluticasone (FLONASE) 50 MCG/ACT nasal spray Place 1 spray into both nostrils daily as needed for allergies or rhinitis.   gabapentin (NEURONTIN) 100 MG capsule Take 2 capsules (200 mg total) by mouth at bedtime.   glipiZIDE (GLUCOTROL XL) 2.5 MG 24 hr tablet Take 2.5 mg by mouth 2 (two) times daily.   linagliptin (TRADJENTA) 5 MG TABS tablet Take 5 mg by mouth daily.   Methoxy PEG-Epoetin Beta (MIRCERA IJ) Mircera   OVER THE COUNTER MEDICATION Place 1 drop into both eyes 2 (two) times daily as needed (dry eyes). Hyper Tears eye drops/Unknown strength   rOPINIRole (REQUIP) 0.25 MG tablet Take 0.25 mg by mouth at bedtime.   SENSIPAR 30 MG tablet Take 30 mg by mouth daily.   sevelamer (RENAGEL) 800 MG tablet Take 1,600 mg by mouth 3 (three) times daily.   tamsulosin (FLOMAX) 0.4 MG CAPS capsule Take 0.4 mg by mouth daily.   zolpidem (AMBIEN) 10 MG tablet Take 10 mg by mouth at bedtime.   Current Facility-Administered Medications for the 01/20/22 encounter (Office Visit) with Park Liter, MD  Medication   regadenoson (LEXISCAN) injection SOLN 0.4 mg   technetium tetrofosmin  (TC-MYOVIEW) injection 26.9 millicurie   technetium tetrofosmin (TC-MYOVIEW) injection 48.5 millicurie     Allergies:   Penicillins   Social History   Socioeconomic History   Marital status: Married    Spouse name: Not on file   Number of children: Not on file   Years of education: Not on file   Highest education level: Not on file  Occupational History   Not on file  Tobacco Use   Smoking status: Former    Packs/day: 2.00    Years: 30.00    Pack years: 60.00    Types: Cigarettes    Start date: 1968    Quit date: 1998    Years  since quitting: 25.1   Smokeless tobacco: Never  Vaping Use   Vaping Use: Never used  Substance and Sexual Activity   Alcohol use: Yes    Comment: Occasionally.   Drug use: Never   Sexual activity: Not on file  Other Topics Concern   Not on file  Social History Narrative   Not on file   Social Determinants of Health   Financial Resource Strain: Not on file  Food Insecurity: Not on file  Transportation Needs: Not on file  Physical Activity: Not on file  Stress: Not on file  Social Connections: Not on file     Family History: The patient's family history includes Cancer in his maternal grandmother. ROS:   Please see the history of present illness.    All 14 point review of systems negative except as described per history of present illness  EKGs/Labs/Other Studies Reviewed:      Recent Labs: No results found for requested labs within last 8760 hours.  Recent Lipid Panel    Component Value Date/Time   CHOL 86 (L) 01/02/2020 0952   TRIG 41 01/02/2020 0952   HDL 50 01/02/2020 0952   CHOLHDL 1.7 01/02/2020 0952   CHOLHDL 2.5 03/25/2019 0454   VLDL 16 03/25/2019 0454   LDLCALC 24 01/02/2020 0952    Physical Exam:    VS:  BP 126/68 (BP Location: Left Arm)    Pulse 91    Ht 6\' 2"  (1.88 m)    Wt 202 lb 12.8 oz (92 kg)    SpO2 95%    BMI 26.04 kg/m     Wt Readings from Last 3 Encounters:  01/20/22 202 lb 12.8 oz (92 kg)   01/16/22 204 lb (92.5 kg)  12/10/21 206 lb (93.4 kg)     GEN:  Well nourished, well developed in no acute distress HEENT: Normal NECK: No JVD; No carotid bruits LYMPHATICS: No lymphadenopathy CARDIAC: RRR, no murmurs, no rubs, no gallops RESPIRATORY:  Clear to auscultation without rales, wheezing or rhonchi  ABDOMEN: Soft, non-tender, non-distended MUSCULOSKELETAL:  No edema; No deformity  SKIN: Warm and dry LOWER EXTREMITIES: no swelling NEUROLOGIC:  Alert and oriented x 3 PSYCHIATRIC:  Normal affect   ASSESSMENT:    1. Essential (primary) hypertension   2. Ischemic cardiomyopathy   3. Bilateral carotid artery stenosis   4. ESRD (end stage renal disease) ([place])    PLAN:    In order of problems listed above:  Ischemic cardiomyopathy.  I will do EKG today if EKG is fine we will start carvedilol 3.125 twice daily.  In about a month we will recheck his echocardiogram with limited views only for today. Essential hypertension: Blood pressure well controlled continue present management. Dyslipidemia, I did review K PN which show his LDL of 24 HDL 50 we will continue present management. Cardiac arterial disease test done in October 2022 showing any significant stenosis both sides.   Medication Adjustments/Labs and Tests Ordered: Current medicines are reviewed at length with the patient today.  Concerns regarding medicines are outlined above.  No orders of the defined types were placed in this encounter.  Medication changes: No orders of the defined types were placed in this encounter.   Signed, Park Liter, MD, [facility] 01/20/2022 2:33 PM    [place]

## 2022-01-23 ENCOUNTER — Ambulatory Visit (INDEPENDENT_AMBULATORY_CARE_PROVIDER_SITE_OTHER): Payer: Medicare HMO

## 2022-01-23 ENCOUNTER — Other Ambulatory Visit: Payer: Self-pay

## 2022-01-23 DIAGNOSIS — I255 Ischemic cardiomyopathy: Secondary | ICD-10-CM | POA: Diagnosis not present

## 2022-01-23 DIAGNOSIS — I10 Essential (primary) hypertension: Secondary | ICD-10-CM

## 2022-01-23 DIAGNOSIS — I6523 Occlusion and stenosis of bilateral carotid arteries: Secondary | ICD-10-CM

## 2022-01-23 DIAGNOSIS — N186 End stage renal disease: Secondary | ICD-10-CM

## 2022-01-23 LAB — ECHOCARDIOGRAM LIMITED
Area-P 1/2: 5.54 cm2
Calc EF: 36.9 %
MV M vel: 4.6 m/s
MV Peak grad: 84.6 mmHg
S' Lateral: 4.6 cm
Single Plane A2C EF: 34.6 %
Single Plane A4C EF: 41.8 %

## 2022-01-28 ENCOUNTER — Telehealth: Payer: Self-pay

## 2022-01-28 MED ORDER — HYDRALAZINE HCL 10 MG PO TABS: 10.0000 mg | ORAL_TABLET | Freq: Three times a day (TID) | ORAL | 1 refills | Status: DC

## 2022-01-28 NOTE — Telephone Encounter (Signed)
-----   Message from Park Liter, MD sent at 01/27/2022 10:37 AM EST ----- ?Echocardiogram showed diminished ejection fraction 30 to 35% which is worse than before.  Please start hydralazine 10 mg 3 times a day ?

## 2022-01-28 NOTE — Telephone Encounter (Signed)
Patient notified of results and recommendations and agreed with plan. Rx sent.  

## 2022-02-19 ENCOUNTER — Other Ambulatory Visit: Payer: Self-pay | Admitting: Cardiology

## 2022-04-23 ENCOUNTER — Encounter: Payer: Self-pay | Admitting: Cardiology

## 2022-04-23 ENCOUNTER — Ambulatory Visit (INDEPENDENT_AMBULATORY_CARE_PROVIDER_SITE_OTHER): Payer: Medicare HMO | Admitting: Cardiology

## 2022-04-23 VITALS — BP 90/50 | HR 82 | Ht 74.0 in | Wt 205.0 lb

## 2022-04-23 DIAGNOSIS — N186 End stage renal disease: Secondary | ICD-10-CM

## 2022-04-23 DIAGNOSIS — I7121 Aneurysm of the ascending aorta, without rupture: Secondary | ICD-10-CM | POA: Diagnosis not present

## 2022-04-23 DIAGNOSIS — I10 Essential (primary) hypertension: Secondary | ICD-10-CM

## 2022-04-23 DIAGNOSIS — E1121 Type 2 diabetes mellitus with diabetic nephropathy: Secondary | ICD-10-CM

## 2022-04-23 DIAGNOSIS — Z951 Presence of aortocoronary bypass graft: Secondary | ICD-10-CM | POA: Diagnosis not present

## 2022-04-23 NOTE — Addendum Note (Signed)
Addended by: Jacobo Forest D on: 04/23/2022 04:42 PM   Modules accepted: Orders

## 2022-04-23 NOTE — Patient Instructions (Signed)
Medication Instructions:  Your physician recommends that you continue on your current medications as directed. Please refer to the Current Medication list given to you today.  *If you need a refill on your cardiac medications before your next appointment, please call your pharmacy*   Lab Work: None Ordered If you have labs (blood work) drawn today and your tests are completely normal, you will receive your results only by: [place] (if you have MyChart) OR A paper copy in the mail If you have any lab test that is abnormal or we need to change your treatment, we will call you to review the results.   Testing/Procedures: None Ordered   Follow-Up: At [facility], you and your health needs are our priority.  As part of our continuing mission to provide you with exceptional heart care, we have created designated Provider Care Teams.  These Care Teams include your primary Cardiologist (physician) and Advanced Practice Providers (APPs -  Physician Assistants and Nurse Practitioners) who all work together to provide you with the care you need, when you need it.  We recommend signing up for the patient portal called "MyChart".  Sign up information is provided on this After Visit Summary.  MyChart is used to connect with patients for Virtual Visits (Telemedicine).  Patients are able to view lab/test results, encounter notes, upcoming appointments, etc.  Non-urgent messages can be sent to your provider as well.   To learn more about what you can do with MyChart, go to [email or web address].    Your next appointment:   3 month(s)  The format for your next appointment:   In Person  Provider:   Jenne Campus, MD    Other Instructions Referral to congestive heart failure clinic- they will call for appt.

## 2022-04-23 NOTE — Progress Notes (Signed)
Cardiology Office Note:    Date:  04/23/2022   ID:  [name], DOB January 17, 1951, MRN [id number]  PCP:  Imagene Riches, NP  Cardiologist:  Jenne Campus, MD    Referring MD: Imagene Riches, NP   Chief Complaint  Patient presents with   Follow-up  I am weak and tired  History of Present Illness:    John [name] is a 71 y.o. male  The patient has a history of coronary artery disease with prior PCI to the RCA, type 2 diabetes, hypertension, PAD status post aortofemoral bypass, end-stage IV chronic kidney disease.  He was found to have elevated troponin, and therefore cardiac catheterization was completed on 03/27/2019.   Cardiac cath revealed severe multivessel disease to include 90% mid RCA lesion, ostial circumflex to proximal circumflex lesion 80% stenosed, proximal LAD to mid LAD 75% stenosed with markedly positive DFR 0.71, first diagonal was 70% stenosed, proximal RCA to mid RCA was 35% stenosed, distal RCA 1 lesion 40% stenosis was prior stent revealing mild in-stent restenosis, distal RCA to lesion 80% stenosed.  The patient was referred to CVTS for surgical consultation for CABG.   The patient underwent a four-vessel CABG on 03/29/2019 with LIMA to LAD, SVG to diagonal, SVG to OM, and SVG to PDA.  He had endoscopic harvesting of the greater saphenous vein from his right leg.  Postoperatively he had thrombocytopenia on Lovenox and this was discontinued, he was also found to have postoperative CHF and was diuresed aggressively.  He was followed by nephrology throughout hospital course.  He was discharged on 04/03/2019   As a part of evaluation he had a chest x-ray done which showed some loculated large fluid in the left pleura, also echocardiogram being done under some suspicion for endocarditis of the aortic valve. TEE done did not confirm presence of active endocarditis.  He did have some calcification of the leaflet of the aortic valve. He was also find to have chronic pleural  effusion on the 02/19/2020 he did have video bronchoscopy done as well as left VATS with drainage of empyema as well as decortication and intercostal nerve block.   He is chronic renal failure progressively got worse until October when he started hemodialysis. He did have AV fistula placed many months ago and that fistula was ready to be used. In October 2022 he had echocardiogram done because of dyspnea on exertion he was identified to have worsening of coronary artery disease his ejection fraction went to 35 to 40%.  Stress test has been done to rule out ischemia as potential cause of his worsening of ejection fraction, however did not show any significant ischemia I am gradually try to put him on guideline directed medical therapy however to allow multiple difficulties because problem blood pressure being low.  He is already on dialysis.  He had repeated echocardiogram done in the spring of this year and there is no improvement left ventricle ejection fraction. 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  He gets dialysis on the regular basis.  But sometimes his blood pressure is very low while doing dialysis.   Past Medical History:  Diagnosis Date   Allergy, unspecified, initial encounter 08/30/2020   Anaphylactic shock, unspecified, initial encounter 08/30/2020   Anemia in chronic kidney disease 08/30/2020   Arthritis    Back pain 09/07/2017   Carotid artery stenosis    38-10% LICA, 1-75% RICA 1/0/25 [place]   Chest pain 03/24/2019   Chronic infective endocarditis  Chronic kidney disease (CKD), stage IV (severe) (HCC) 12/01/2018   CKD (chronic kidney disease), stage V ([place]) 03/24/2019   Coronary artery disease    Dependence on renal dialysis ([place]) 08/31/2020   Diabetes mellitus without complication (HCC)    Disorder of phosphorus metabolism, unspecified 08/31/2020   DM (diabetes mellitus), type 2 with renal complications ([place]) 34/74/2595   Dyspnea    Encounter for screening for respiratory tuberculosis  08/30/2020   End stage renal disease ([place]) 09/07/2017   ESRD (end stage renal disease) ([place]) 08/27/2020   Essential (primary) hypertension 09/07/2017   Fever    History of endocarditis 01/09/2020   Hypercalcemia 10/08/2020   Hyperlipidemia, unspecified 09/07/2017   Hypertension    Hypokalemia 11/13/2021   Iron deficiency anemia, unspecified 08/30/2020   Low back pain 09/07/2017   MI (myocardial infarction) ([place])    MSSA bacteremia 09/10/2017   Non-ST elevation (NSTEMI) myocardial infarction ([place]) 01/09/2020   NSTEMI (non-ST elevated myocardial infarction) ([place]) 03/24/2019   Other specified arthritis, other site 08/31/2020   Pain, unspecified 08/30/2020   Personal history of other diseases of the circulatory system 08/31/2020   Presence of aortocoronary bypass graft 08/31/2020   Pruritus, unspecified 08/30/2020   Renal disorder    Restless legs syndrome 09/06/2020   S/P CABG x 4 04/03/2019   S/P Video Bronchoscopy, Right VATS with drainage of pleural effusion, decortication, intercostal nerve block 02/20/2020   Secondary hyperparathyroidism of renal origin ([place]) 08/30/2020   Thoracic aortic aneurysm (TAA) (HCC)    4.3 cm ascending TAA 02/02/20 CT   Type 2 diabetes mellitus with diabetic nephropathy ([place]) 08/30/2020    Past Surgical History:  Procedure Laterality Date   ABDOMINAL AORTIC ANEURYSM REPAIR     APPENDECTOMY     AV FISTULA PLACEMENT Right 10/18/2018   Procedure: BRACHIO-CEPHALIC ARTERIOVENOUS (AV) FISTULA CREATION;  Surgeon: Angelia Mould, MD;  Location: [facility];  Service: Vascular;  Laterality: Right;   CARDIAC CATHETERIZATION     CORONARY ANGIOPLASTY     CORONARY ARTERY BYPASS GRAFT N/A 03/29/2019   Procedure: CORONARY ARTERY BYPASS GRAFTING (CABG) x 4, using left internal mammary artery and right leg greater saphenous vein harvested endoscopically;  Surgeon: Melrose Nakayama, MD;  Location: [facility];  Service: Open Heart Surgery;  Laterality: N/A;   CORONARY PRESSURE  WIRE/FFR WITH 3D MAPPING N/A 03/27/2019   Procedure: Coronary Pressure Wire/FFR w/3D Mapping;  Surgeon: Jettie Booze, MD;  Location: [facility];  Service: Cardiovascular;  Laterality: N/A;   DECORTICATION Left 02/19/2020   Procedure: DECORTICATION;  Surgeon: Melrose Nakayama, MD;  Location: [facility];  Service: Thoracic;  Laterality: Left;   INTERCOSTAL NERVE BLOCK Left 02/19/2020   Procedure: Intercostal Nerve Block;  Surgeon: Melrose Nakayama, MD;  Location: [facility];  Service: Thoracic;  Laterality: Left;   IR FLUORO GUIDE CV LINE RIGHT  09/13/2017   IR REMOVAL TUN CV CATH W/O FL  10/22/2017   IR US GUIDE VASC ACCESS RIGHT  09/13/2017   LEFT HEART CATH AND CORONARY ANGIOGRAPHY N/A 03/27/2019   Procedure: LEFT HEART CATH AND CORONARY ANGIOGRAPHY;  Surgeon: Jettie Booze, MD;  Location: [facility];  Service: Cardiovascular;  Laterality: N/A;   NECK SURGERY     PLEURAL EFFUSION DRAINAGE Left 02/19/2020   Procedure: DRAINAGE OF PLEURAL EFFUSION;  Surgeon: Melrose Nakayama, MD;  Location: [facility];  Service: Thoracic;  Laterality: Left;   SMALL INTESTINE SURGERY     TEE WITHOUT CARDIOVERSION N/A  09/10/2017   Procedure: TRANSESOPHAGEAL ECHOCARDIOGRAM (TEE);  Surgeon: Skeet Latch, MD;  Location: [facility];  Service: Cardiovascular;  Laterality: N/A;   TEE WITHOUT CARDIOVERSION N/A 03/29/2019   Procedure: TRANSESOPHAGEAL ECHOCARDIOGRAM (TEE);  Surgeon: Melrose Nakayama, MD;  Location: [facility];  Service: Open Heart Surgery;  Laterality: N/A;   TEE WITHOUT CARDIOVERSION N/A 01/12/2020   Procedure: TRANSESOPHAGEAL ECHOCARDIOGRAM (TEE);  Surgeon: Sanda Klein, MD;  Location: [facility];  Service: Cardiovascular;  Laterality: N/A;   VIDEO ASSISTED THORACOSCOPY Left 02/19/2020   Procedure: VIDEO ASSISTED THORACOSCOPY;  Surgeon: Melrose Nakayama, MD;  Location: [facility];  Service: Thoracic;  Laterality: Left;   VIDEO BRONCHOSCOPY N/A 02/19/2020   Procedure:  VIDEO BRONCHOSCOPY;  Surgeon: Melrose Nakayama, MD;  Location: [facility];  Service: Thoracic;  Laterality: N/A;    Current Medications: Current Meds  Medication Sig   acetaminophen (TYLENOL) 500 MG tablet Take 1,500 mg by mouth 2 (two) times daily as needed for mild pain or moderate pain (pain).   albuterol (VENTOLIN HFA) 108 (90 Base) MCG/ACT inhaler TAKE 2 PUFFS BY MOUTH EVERY 6 HOURS AS NEEDED FOR WHEEZE OR SHORTNESS OF BREATH   aspirin EC 81 MG tablet Take 81 mg by mouth daily.   atorvastatin (LIPITOR) 40 MG tablet Take 40 mg by mouth daily in the afternoon.    AURYXIA 1 GM 210 MG(Fe) tablet Take 210 mg by mouth 3 (three) times daily.   b complex-vitamin c-folic acid (NEPHRO-VITE) 0.8 MG TABS tablet SMARTSIG:1 Tablet(s) By Mouth Every Evening   carvedilol (COREG) 3.125 MG tablet Take 1 tablet (3.125 mg total) by mouth 2 (two) times daily.   Dulaglutide 1.5 MG/0.5ML SOPN Inject 1.5 mg into the skin every Sunday.   ferric citrate (AURYXIA) 1 GM 210 MG(Fe) tablet Take by mouth.   ferrous sulfate 325 (65 FE) MG tablet Take 325 mg by mouth 2 (two) times daily with a meal.   fluticasone (FLONASE) 50 MCG/ACT nasal spray Place 1 spray into both nostrils daily as needed for allergies or rhinitis.   gabapentin (NEURONTIN) 100 MG capsule Take 2 capsules (200 mg total) by mouth at bedtime.   glipiZIDE (GLUCOTROL XL) 2.5 MG 24 hr tablet Take 2.5 mg by mouth 2 (two) times daily.   hydrALAZINE (APRESOLINE) 10 MG tablet Take 1 tablet (10 mg total) by mouth 3 (three) times daily.   linagliptin (TRADJENTA) 5 MG TABS tablet Take 5 mg by mouth daily.   Methoxy PEG-Epoetin Beta (MIRCERA IJ) Mircera   OVER THE COUNTER MEDICATION Place 1 drop into both eyes 2 (two) times daily as needed (dry eyes). Hyper Tears eye drops/Unknown strength   rOPINIRole (REQUIP) 0.25 MG tablet Take 0.25 mg by mouth at bedtime.   SENSIPAR 30 MG tablet Take 30 mg by mouth daily.   sevelamer (RENAGEL) 800 MG tablet Take 1,600 mg by  mouth 3 (three) times daily.   tamsulosin (FLOMAX) 0.4 MG CAPS capsule Take 0.4 mg by mouth daily.   zolpidem (AMBIEN) 10 MG tablet Take 10 mg by mouth at bedtime.   Current Facility-Administered Medications for the 04/23/22 encounter (Office Visit) with Park Liter, MD  Medication   regadenoson (LEXISCAN) injection SOLN 0.4 mg   technetium tetrofosmin (TC-MYOVIEW) injection 73.5 millicurie   technetium tetrofosmin (TC-MYOVIEW) injection 32.9 millicurie     Allergies:   Penicillins   Social History   Socioeconomic History   Marital status: Married    Spouse name: Not on file   Number of children:  Not on file   Years of education: Not on file   Highest education level: Not on file  Occupational History   Not on file  Tobacco Use   Smoking status: Former    Packs/day: 2.00    Years: 30.00    Pack years: 60.00    Types: Cigarettes    Start date: 58    Quit date: 1998    Years since quitting: 25.4   Smokeless tobacco: Never  Vaping Use   Vaping Use: Never used  Substance and Sexual Activity   Alcohol use: Yes    Comment: Occasionally.   Drug use: Never   Sexual activity: Not on file  Other Topics Concern   Not on file  Social History Narrative   Not on file   Social Determinants of Health   Financial Resource Strain: Not on file  Food Insecurity: Not on file  Transportation Needs: Not on file  Physical Activity: Not on file  Stress: Not on file  Social Connections: Not on file     Family History: The patient's family history includes Cancer in his maternal grandmother. ROS:   Please see the history of present illness.    All 14 point review of systems negative except as described per history of present illness  EKGs/Labs/Other Studies Reviewed:      Recent Labs: No results found for requested labs within last 8760 hours.  Recent Lipid Panel    Component Value Date/Time   CHOL 86 (L) 01/02/2020 0952   TRIG 41 01/02/2020 0952   HDL 50  01/02/2020 0952   CHOLHDL 1.7 01/02/2020 0952   CHOLHDL 2.5 03/25/2019 0454   VLDL 16 03/25/2019 0454   LDLCALC 24 01/02/2020 0952    Physical Exam:    VS:  BP (!) 90/50 (BP Location: Left Arm, Patient Position: Sitting, Cuff Size: Normal)   Pulse 82   Ht 6\' 2"  (1.88 m)   Wt 205 lb (93 kg)   SpO2 95%   BMI 26.32 kg/m     Wt Readings from Last 3 Encounters:  04/23/22 205 lb (93 kg)  01/20/22 202 lb 12.8 oz (92 kg)  01/16/22 204 lb (92.5 kg)     GEN:  Well nourished, well developed in no acute distress HEENT: Normal NECK: No JVD; No carotid bruits LYMPHATICS: No lymphadenopathy CARDIAC: RRR, soft systolic murmur grade 1/6 best heard left border of sternum, no rubs, no gallops RESPIRATORY:  Clear to auscultation without rales, wheezing or rhonchi  ABDOMEN: Soft, non-tender, non-distended MUSCULOSKELETAL:  No edema; No deformity  SKIN: Warm and dry LOWER EXTREMITIES: no swelling NEUROLOGIC:  Alert and oriented x 3 PSYCHIATRIC:  Normal affect   ASSESSMENT:    1. S/P CABG x 4   2. ESRD (end stage renal disease) ([place])   3. Type 2 diabetes mellitus with diabetic nephropathy, unspecified whether long term insulin use ([place])   4. Aneurysm of ascending aorta without rupture ([place])   5. Essential (primary) hypertension    PLAN:    In order of problems listed above:  Status post coronary bypass graft.  No evidence of ischemia based on last stress test.  No symptoms that would suggest reactivation of the problem Cardiomyopathy which is at least partially ischemic.  Deterioration of left ventricle ejection fraction, very limited option in terms of medication I can use to help with that I will refer him to our advanced congestive heart failure clinic in [facility] End-stage renal disease he is on dialysis on  the regular basis Type 2 diabetes I did review K PN however I do not have latest results of his tests. Aneurysm of the ascending aorta noted. Essential hypertension: We have  difficulty with low blood pressure rather than blood pressure being too high.  Patient will be referred to our advanced congestive heart failure clinic   Medication Adjustments/Labs and Tests Ordered: Current medicines are reviewed at length with the patient today.  Concerns regarding medicines are outlined above.  No orders of the defined types were placed in this encounter.  Medication changes: No orders of the defined types were placed in this encounter.   Signed, Park Liter, MD, [facility] 04/23/2022 3:53 PM    [facility]

## 2022-04-24 ENCOUNTER — Telehealth: Payer: Self-pay

## 2022-04-24 NOTE — Telephone Encounter (Signed)
   Pre-operative Risk Assessment    Patient Name: John [name]  DOB: 19-Jul-1951 MRN: [id number]     Request for Surgical Clearance    Procedure:  Dental Extraction - Amount of Teeth to be Pulled:  16  Date of Surgery:  Clearance TBD                                 Surgeon:  Durene Cal, DDS Surgeon's Group or Practice Name:  Phone number:  [phone] Fax number:  [phone]   Type of Clearance Requested:   - Medical    Type of Anesthesia:  Local    Additional requests/questions:    SignedLowella Grip   04/24/2022, 1:19 PM

## 2022-04-27 NOTE — Telephone Encounter (Signed)
    Patient Name: John [name]  DOB: 26-Sep-1951 MRN: [id number]  Primary Cardiologist: Jenne Campus, MD  Chart reviewed as part of pre-operative protocol coverage. Given past medical history and time since last visit, based on ACC/AHA guidelines, John [name] would be at acceptable risk for the planned procedure without further cardiovascular testing.   He does not need SBE prophylaxis.  The patient was advised that if he develops new symptoms prior to surgery to contact our office to arrange for a follow-up visit, and he verbalized understanding.  I will route this recommendation to the requesting party via Epic fax function and remove from pre-op pool.  Please call with questions.  [place], [place] 04/27/2022, 7:24 PM

## 2022-05-12 ENCOUNTER — Telehealth: Payer: Self-pay | Admitting: Cardiology

## 2022-05-12 NOTE — Telephone Encounter (Signed)
Patient was calling for update on his referral called the office was but they were behind of schd. Patient wanted to make the dr aware. Please advise

## 2022-05-13 NOTE — Telephone Encounter (Signed)
FYI

## 2022-05-16 ENCOUNTER — Emergency Department (HOSPITAL_COMMUNITY): Payer: Medicare HMO

## 2022-05-16 ENCOUNTER — Emergency Department (HOSPITAL_COMMUNITY)
Admission: EM | Admit: 2022-05-16 | Discharge: 2022-05-16 | Disposition: A | Discharge status: Home / Self Care | Payer: Medicare HMO | Attending: Emergency Medicine | Admitting: Emergency Medicine

## 2022-05-16 ENCOUNTER — Emergency Department: Payer: Self-pay

## 2022-05-16 ENCOUNTER — Other Ambulatory Visit: Payer: Self-pay

## 2022-05-16 DIAGNOSIS — Z7984 Long term (current) use of oral hypoglycemic drugs: Secondary | ICD-10-CM | POA: Diagnosis not present

## 2022-05-16 DIAGNOSIS — Z79899 Other long term (current) drug therapy: Secondary | ICD-10-CM | POA: Insufficient documentation

## 2022-05-16 DIAGNOSIS — R0609 Other forms of dyspnea: Secondary | ICD-10-CM

## 2022-05-16 DIAGNOSIS — Z992 Dependence on renal dialysis: Secondary | ICD-10-CM | POA: Diagnosis not present

## 2022-05-16 DIAGNOSIS — N186 End stage renal disease: Secondary | ICD-10-CM | POA: Insufficient documentation

## 2022-05-16 DIAGNOSIS — E1122 Type 2 diabetes mellitus with diabetic chronic kidney disease: Secondary | ICD-10-CM | POA: Diagnosis not present

## 2022-05-16 DIAGNOSIS — R0602 Shortness of breath: Secondary | ICD-10-CM | POA: Diagnosis present

## 2022-05-16 DIAGNOSIS — R051 Acute cough: Secondary | ICD-10-CM

## 2022-05-16 DIAGNOSIS — I120 Hypertensive chronic kidney disease with stage 5 chronic kidney disease or end stage renal disease: Secondary | ICD-10-CM | POA: Insufficient documentation

## 2022-05-16 DIAGNOSIS — Z7951 Long term (current) use of inhaled steroids: Secondary | ICD-10-CM | POA: Insufficient documentation

## 2022-05-16 DIAGNOSIS — R6889 Other general symptoms and signs: Secondary | ICD-10-CM | POA: Insufficient documentation

## 2022-05-16 DIAGNOSIS — Z7982 Long term (current) use of aspirin: Secondary | ICD-10-CM | POA: Insufficient documentation

## 2022-05-16 DIAGNOSIS — I2510 Atherosclerotic heart disease of native coronary artery without angina pectoris: Secondary | ICD-10-CM | POA: Insufficient documentation

## 2022-05-16 LAB — CBC
HCT: 30.3 % — ABNORMAL LOW (ref 39.0–52.0)
Hemoglobin: 9.6 g/dL — ABNORMAL LOW (ref 13.0–17.0)
MCH: 33.6 pg (ref 26.0–34.0)
MCHC: 31.7 g/dL (ref 30.0–36.0)
MCV: 105.9 fL — ABNORMAL HIGH (ref 80.0–100.0)
Platelets: 106 10*3/uL — ABNORMAL LOW (ref 150–400)
RBC: 2.86 MIL/uL — ABNORMAL LOW (ref 4.22–5.81)
RDW: 15.5 % (ref 11.5–15.5)
WBC: 5.5 10*3/uL (ref 4.0–10.5)
nRBC: 0 % (ref 0.0–0.2)

## 2022-05-16 LAB — MAGNESIUM: Magnesium: 2.2 mg/dL (ref 1.7–2.4)

## 2022-05-16 LAB — HEPATIC FUNCTION PANEL
ALT: 16 U/L (ref 0–44)
AST: 17 U/L (ref 15–41)
Albumin: 3.6 g/dL (ref 3.5–5.0)
Alkaline Phosphatase: 76 U/L (ref 38–126)
Bilirubin, Direct: 0.2 mg/dL (ref 0.0–0.2)
Indirect Bilirubin: 0.7 mg/dL (ref 0.3–0.9)
Total Bilirubin: 0.9 mg/dL (ref 0.3–1.2)
Total Protein: 6.7 g/dL (ref 6.5–8.1)

## 2022-05-16 LAB — BASIC METABOLIC PANEL
Anion gap: 16 — ABNORMAL HIGH (ref 5–15)
BUN: 13 mg/dL (ref 8–23)
CO2: 30 mmol/L (ref 22–32)
Calcium: 9.8 mg/dL (ref 8.9–10.3)
Chloride: 93 mmol/L — ABNORMAL LOW (ref 98–111)
Creatinine, Ser: 5.05 mg/dL — ABNORMAL HIGH (ref 0.61–1.24)
GFR, Estimated: 12 mL/min — ABNORMAL LOW (ref >60)
Glucose, Bld: 116 mg/dL — ABNORMAL HIGH (ref 70–99)
Potassium: 3.7 mmol/L (ref 3.5–5.1)
Sodium: 139 mmol/L (ref 135–145)

## 2022-05-16 LAB — D-DIMER, QUANTITATIVE: D-Dimer, Quant: 3.22 ug/mL-FEU — ABNORMAL HIGH (ref 0.00–0.50)

## 2022-05-16 LAB — TROPONIN I (HIGH SENSITIVITY)
Troponin I (High Sensitivity): 74 ng/L — ABNORMAL HIGH (ref <18)
Troponin I (High Sensitivity): 77 ng/L — ABNORMAL HIGH (ref <18)

## 2022-05-16 MED ORDER — ALBUTEROL SULFATE HFA 108 (90 BASE) MCG/ACT IN AERS: 1.0000 | INHALATION_SPRAY | Freq: Four times a day (QID) | RESPIRATORY_TRACT | 3 refills | Status: DC | PRN

## 2022-05-16 MED ORDER — IOHEXOL 350 MG/ML SOLN
80.0000 mL | Freq: Once | INTRAVENOUS | Status: AC | PRN
  Administered 2022-05-16: 80 mL via INTRAVENOUS

## 2022-05-16 MED ORDER — PREDNISONE 10 MG (21) PO TBPK: ORAL_TABLET | Freq: Every day | ORAL | 0 refills | Status: DC

## 2022-05-16 MED ORDER — PREDNISONE 20 MG PO TABS
60.0000 mg | ORAL_TABLET | Freq: Once | ORAL | Status: AC
  Administered 2022-05-16: 60 mg via ORAL
  Filled 2022-05-16: qty 3

## 2022-05-16 MED ORDER — ALBUTEROL SULFATE HFA 108 (90 BASE) MCG/ACT IN AERS
2.0000 | INHALATION_SPRAY | Freq: Once | RESPIRATORY_TRACT | Status: AC
  Administered 2022-05-16: 2 via RESPIRATORY_TRACT
  Filled 2022-05-16: qty 6.7

## 2022-07-02 ENCOUNTER — Ambulatory Visit (INDEPENDENT_AMBULATORY_CARE_PROVIDER_SITE_OTHER): Payer: Medicare HMO | Admitting: Emergency Medicine

## 2022-07-02 ENCOUNTER — Encounter: Payer: Self-pay | Admitting: Emergency Medicine

## 2022-07-02 DIAGNOSIS — R053 Chronic cough: Secondary | ICD-10-CM | POA: Diagnosis not present

## 2022-07-02 DIAGNOSIS — R0602 Shortness of breath: Secondary | ICD-10-CM | POA: Diagnosis not present

## 2022-07-02 MED ORDER — PANTOPRAZOLE SODIUM 20 MG PO TBEC: 20.0000 mg | DELAYED_RELEASE_TABLET | Freq: Every day | ORAL | 0 refills | Status: DC

## 2022-07-02 NOTE — Addendum Note (Signed)
Addended by: Gavin Potters R on: 07/02/2022 03:13 PM   Modules accepted: Orders

## 2022-07-02 NOTE — Progress Notes (Signed)
Subjective:    Patient ID: John [name], male    DOB: [birth year], 71 y.o.   MRN: [id number]  HPI 71 year old man, former smoker (60 pack years) with a history of CAD/CABG, chronic renal failure on HD, hypertension, diabetes, history of MSSA bacteremia and endocarditis, left VATS drainage of a pleural effusion.  He is referred today for cough and shortness of breath. He had a TTE in March that shows decreased LV fxn to 35%.  He has started to have cough beginning about 2 months ago. He has been waked from sleep with cough and SOB. He can also have exertional SOB when he is walking, working. He uses albuterol frequently through the day. He has increased nasal congestion and mucous over the last week. Occasional reflux sx. He has tried scheduled BD before but he isn't sure that it has helped him. Has flonase that he takes qod.   CT-PA 05/16/22 reviewed by me showed no PE, stable TAA, atelectasis and chronic L sided volume loss   Review of Systems As per HPI  Past Medical History:  Diagnosis Date   Allergy, unspecified, initial encounter 08/30/2020   Anaphylactic shock, unspecified, initial encounter 08/30/2020   Anemia in chronic kidney disease 08/30/2020   Arthritis    Back pain 09/07/2017   Carotid artery stenosis    99-37% LICA, 1-69% RICA 04/29/88 [place]   Chest pain 03/24/2019   Chronic infective endocarditis    Chronic kidney disease (CKD), stage IV (severe) ([place]) 12/01/2018   CKD (chronic kidney disease), stage V ([place]) 03/24/2019   Coronary artery disease    Dependence on renal dialysis ([place]) 08/31/2020   Diabetes mellitus without complication (HCC)    Disorder of phosphorus metabolism, unspecified 08/31/2020   DM (diabetes mellitus), type 2 with renal complications ([place]) 38/08/1750   Dyspnea    Encounter for screening for respiratory tuberculosis 08/30/2020   End stage renal disease ([place]) 09/07/2017   ESRD (end stage renal disease) ([place]) 08/27/2020   Essential (primary) hypertension  09/07/2017   Fever    History of endocarditis 01/09/2020   Hypercalcemia 10/08/2020   Hyperlipidemia, unspecified 09/07/2017   Hypertension    Hypokalemia 11/13/2021   Iron deficiency anemia, unspecified 08/30/2020   Low back pain 09/07/2017   MI (myocardial infarction) ([place])    MSSA bacteremia 09/10/2017   Non-ST elevation (NSTEMI) myocardial infarction ([place]) 01/09/2020   NSTEMI (non-ST elevated myocardial infarction) ([address]) 03/24/2019   Other specified arthritis, other site 08/31/2020   Pain, unspecified 08/30/2020   Personal history of other diseases of the circulatory system 08/31/2020   Presence of aortocoronary bypass graft 08/31/2020   Pruritus, unspecified 08/30/2020   Renal disorder    Restless legs syndrome 09/06/2020   S/P CABG x 4 04/03/2019   S/P Video Bronchoscopy, Right VATS with drainage of pleural effusion, decortication, intercostal nerve block 02/20/2020   Secondary hyperparathyroidism of renal origin ([place]) 08/30/2020   Thoracic aortic aneurysm (TAA) (HCC)    4.3 cm ascending TAA 02/02/20 CT   Type 2 diabetes mellitus with diabetic nephropathy ([place]) 08/30/2020     Family History  Problem Relation Age of Onset   Cancer Maternal Grandmother      Social History   Socioeconomic History   Marital status: Married    Spouse name: Not on file   Number of children: Not on file   Years of education: Not on file   Highest education level: Not on file  Occupational History   Not on file  Tobacco  Use   Smoking status: Former    Packs/day: 2.00    Years: 30.00    Total pack years: 60.00    Types: Cigarettes    Start date: 1968    Quit date: 1998    Years since quitting: 25.6   Smokeless tobacco: Never  Vaping Use   Vaping Use: Never used  Substance and Sexual Activity   Alcohol use: Yes    Comment: Occasionally.   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 No Did it involve sudden or severe rash/hives, skin peeling, or any reaction on the inside of your mouth or nose? Yes Did you need to seek medical attention at a hospital or doctor's office? Yes When did it last happen?      Childhood allergy If all above answers are "NO", may proceed with cephalosporin use.      Outpatient Medications Prior to Visit  Medication Sig Dispense Refill   acetaminophen (TYLENOL) 500 MG tablet Take 1,500 mg by mouth 2 (two) times daily as needed for mild pain or moderate pain (pain).     albuterol (VENTOLIN HFA) 108 (90 Base) MCG/ACT inhaler TAKE 2 PUFFS BY MOUTH EVERY 6 HOURS AS NEEDED FOR WHEEZE OR SHORTNESS OF BREATH 6.7 g 3   aspirin EC 81 MG tablet Take 81 mg by mouth daily.     atorvastatin (LIPITOR) 40 MG tablet Take 40 mg by mouth daily in the afternoon.      AURYXIA 1 GM 210 MG(Fe) tablet Take 210 mg by mouth 3 (three) times daily.     b complex-vitamin c-folic acid (NEPHRO-VITE) 0.8 MG TABS tablet SMARTSIG:1 Tablet(s) By Mouth Every Evening     carvedilol (COREG) 3.125 MG tablet Take 1 tablet (3.125 mg total) by mouth 2 (two) times daily. 180 tablet 3   Dulaglutide 1.5 MG/0.5ML SOPN Inject 1.5 mg into the skin every Sunday.     ferric citrate (AURYXIA) 1 GM 210 MG(Fe) tablet Take by mouth.     ferrous sulfate 325 (65 FE) MG tablet Take 325 mg by mouth 2 (two) times daily with a meal.     fluticasone (FLONASE) 50 MCG/ACT nasal spray Place 1 spray into both nostrils daily as needed for allergies or rhinitis.     gabapentin (NEURONTIN) 100 MG  capsule Take 2 capsules (200 mg total) by mouth at bedtime.     glipiZIDE (GLUCOTROL XL) 2.5 MG 24 hr tablet Take 2.5 mg by mouth 2 (two) times daily.     hydrALAZINE (APRESOLINE) 10 MG tablet Take 1 tablet (10 mg total) by mouth 3 (three) times daily. 270 tablet 3   linagliptin (TRADJENTA) 5 MG TABS tablet Take 5 mg by mouth daily.     Methoxy PEG-Epoetin Beta (MIRCERA IJ) Mircera     OVER THE COUNTER MEDICATION Place 1 drop into both eyes 2 (two) times daily as needed (dry  eyes). Hyper Tears eye drops/Unknown strength     predniSONE (STERAPRED UNI-PAK 21 TAB) 10 MG (21) TBPK tablet Take by mouth daily. Take 6 tabs by mouth daily  for 2 days, then 5 tabs for 2 days, then 4 tabs for 2 days, then 3 tabs for 2 days, 2 tabs for 2 days, then 1 tab by mouth daily for 2 days 42 tablet 0   rOPINIRole (REQUIP) 0.25 MG tablet Take 0.25 mg by mouth at bedtime.     SENSIPAR 30 MG tablet Take 30 mg by mouth daily.     sevelamer (RENAGEL) 800 MG tablet Take 1,600 mg by mouth 3 (three) times daily.     tamsulosin (FLOMAX) 0.4 MG CAPS capsule Take 0.4 mg by mouth daily.     zolpidem (AMBIEN) 10 MG tablet Take 10 mg by mouth at bedtime.     albuterol (VENTOLIN HFA) 108 (90 Base) MCG/ACT inhaler Inhale 1-2 puffs into the lungs every 6 (six) hours as needed for wheezing or shortness of breath. (Patient not taking: Reported on 07/02/2022) 1 each 3   Facility-Administered Medications Prior to Visit  Medication Dose Route Frequency Provider Last Rate Last Admin   regadenoson (LEXISCAN) injection SOLN 0.4 mg  0.4 mg Intravenous Once Revankar, Reita Cliche, MD       technetium tetrofosmin (TC-MYOVIEW) injection 01.7 millicurie  51.0 millicurie Intravenous Once PRN Revankar, Reita Cliche, MD       technetium tetrofosmin (TC-MYOVIEW) injection 25.8 millicurie  52.7 millicurie Intravenous Once PRN Revankar, Reita Cliche, MD            Objective:   Physical Exam Vitals:   07/02/22 1421  BP: 116/68  Pulse: 94  Temp: 98.7 F (37.1  C)  TempSrc: Oral  SpO2: 94%  Weight: 205 lb 12.8 oz (93.4 kg)  Height: 5\' 11"  (1.803 m)   Gen: Pleasant, well-nourished, in no distress,  normal affect  ENT: No lesions,  mouth clear,  oropharynx clear, no postnasal drip  Neck: No JVD, no stridor  Lungs: No use of accessory muscles, distant, no crackles or wheezing on normal respiration, no wheeze on forced expiration  Cardiovascular: RRR, heart sounds normal, no murmur or gallops, no peripheral edema, fistula  Musculoskeletal: No deformities, no cyanosis or clubbing  Neuro: alert, awake, non focal  Skin: Warm, no lesions or rash      Assessment & Plan:  Dyspnea Suspect multifactorial.  Probably at least in part cardiorenal given his decreased LV function and his dialysis dependence.  He likely has some degree of COPD.  He seems to respond to intermittent albuterol but was on a scheduled BD in the past that he did not think helped him.  He does not know the name.  I asked him to bring it next time.  Considered starting him on empiric LABA/LAMA but will defer for now and instead wait until after we have PFT.  We will perform pulmonary function testing in next office visit Keep your albuterol available to use 2 puffs up to every 4 hours if needed for shortness of breath, chest tightness, wheezing. We may decide to start an additional every day inhaler depending on how your pulmonary function testing looks Follow-up with cardiology and with cardiothoracic surgery as planned Follow with Dr. Lamonte Sakai next available with full pulmonary function testing on the same day.   Chronic cough Question whether some of this may be his evolving systolic CHF.  He has been awakened at night with cough and dyspnea.  Will try to address the straightforward exacerbators of upper airway irritation and cough including rhinitis and empiric treatment of GERD (he does not feel any reflux symptoms).  PFTs as above   Continue to use your fluticasone nasal  spray, 2 sprays each nostril every other day. Try starting pantoprazole 40 mg once daily for the next 3 weeks.  Take 1 hour around food.  Keep track of whether this medication has any impact on your coughing.  Hopefully will decrease your cough frequency    Baltazar Apo, MD, PhD 07/02/2022, 3:07 PM [facility] [phone] or if no answer before 7:00PM call [phone] For any issues after 7:00PM please call eLink [phone]

## 2022-07-02 NOTE — Patient Instructions (Signed)
We will perform pulmonary function testing in next office visit Continue to use your fluticasone nasal spray, 2 sprays each nostril every other day. Try starting pantoprazole 40 mg once daily for the next 3 weeks.  Take 1 hour around food.  Keep track of whether this medication has any impact on your coughing.  Hopefully will decrease your cough frequency Keep your albuterol available to use 2 puffs up to every 4 hours if needed for shortness of breath, chest tightness, wheezing. We may decide to start an additional every day inhaler depending on how your pulmonary function testing looks Follow-up with cardiology and with cardiothoracic surgery as planned Follow with Dr. Lamonte Sakai next available with full pulmonary function testing on the same day.

## 2022-07-02 NOTE — Assessment & Plan Note (Signed)
Suspect multifactorial.  Probably at least in part cardiorenal given his decreased LV function and his dialysis dependence.  He likely has some degree of COPD.  He seems to respond to intermittent albuterol but was on a scheduled BD in the past that he did not think helped him.  He does not know the name.  I asked him to bring it next time.  Considered starting him on empiric LABA/LAMA but will defer for now and instead wait until after we have PFT.  We will perform pulmonary function testing in next office visit Keep your albuterol available to use 2 puffs up to every 4 hours if needed for shortness of breath, chest tightness, wheezing. We may decide to start an additional every day inhaler depending on how your pulmonary function testing looks Follow-up with cardiology and with cardiothoracic surgery as planned Follow with Dr. Lamonte Sakai next available with full pulmonary function testing on the same day.

## 2022-07-02 NOTE — Assessment & Plan Note (Signed)
Question whether some of this may be his evolving systolic CHF.  He has been awakened at night with cough and dyspnea.  Will try to address the straightforward exacerbators of upper airway irritation and cough including rhinitis and empiric treatment of GERD (he does not feel any reflux symptoms).  PFTs as above   Continue to use your fluticasone nasal spray, 2 sprays each nostril every other day. Try starting pantoprazole 40 mg once daily for the next 3 weeks.  Take 1 hour around food.  Keep track of whether this medication has any impact on your coughing.  Hopefully will decrease your cough frequency

## 2022-07-23 ENCOUNTER — Other Ambulatory Visit: Payer: Self-pay | Admitting: Emergency Medicine

## 2022-07-24 NOTE — Telephone Encounter (Signed)
Dr. Lamonte Sakai, please advise if you are okay with [place] refilling med.

## 2022-07-28 ENCOUNTER — Institutional Professional Consult (permissible substitution) (INDEPENDENT_AMBULATORY_CARE_PROVIDER_SITE_OTHER): Payer: Medicare HMO | Admitting: Thoracic Surgery (Cardiothoracic Vascular Surgery)

## 2022-07-28 ENCOUNTER — Encounter: Payer: Self-pay | Admitting: Thoracic Surgery (Cardiothoracic Vascular Surgery)

## 2022-07-28 VITALS — BP 114/68 | HR 87 | Resp 18 | Ht 71.0 in | Wt 205.0 lb

## 2022-07-28 DIAGNOSIS — I7121 Aneurysm of the ascending aorta, without rupture: Secondary | ICD-10-CM | POA: Diagnosis not present

## 2022-07-28 NOTE — Progress Notes (Signed)
PCP is Rhea Bleacher, NP Referring Provider is Rhea Bleacher, NP  Chief Complaint  Patient presents with   Thoracic Aortic Aneurysm    CTA chest 6/24    HPI: John [name] is sent for consultation regarding an ascending aortic aneurysm and thoracic aortic atherosclerosis.  John [name] is a 71 year old man with a history of CAD, non-ST elevation MI, CABG, end-stage renal disease, hemodialysis, hypertension, hyperlipidemia, type 2 diabetes, low back pain, arthritis, pleural effusion, and thoracic aortic atherosclerosis.  I did a CABG x4 in May 2020.  He later required a left VATS for decortication after having COVID and developing a pleural effusion.  Unclear if that was related to his pulmonary infection.  Back in June he was seen in the emergency room with shortness of breath.  He had a CT to rule out pulmonary embolus.  There was no PE but he was noted to have a 4.5 cm ascending aneurysm, 4.1 cm descending aneurysm, and severe thoracic aortic atherosclerosis.  His primary complaint at present is a cough which is currently being evaluated by Dr. Lamonte Sakai.  He was treated with a PPI and a prednisone taper recently and has not had any improvement yet.   Past Medical History:  Diagnosis Date   Allergy, unspecified, initial encounter 08/30/2020   Anaphylactic shock, unspecified, initial encounter 08/30/2020   Anemia in chronic kidney disease 08/30/2020   Arthritis    Back pain 09/07/2017   Carotid artery stenosis    32-44% LICA, 0-10% RICA 12/31/23 [place]   Chest pain 03/24/2019   Chronic infective endocarditis    Chronic kidney disease (CKD), stage IV (severe) ([place]) 12/01/2018   CKD (chronic kidney disease), stage V ([place]) 03/24/2019   Coronary artery disease    Dependence on renal dialysis ([place]) 08/31/2020   Diabetes mellitus without complication (HCC)    Disorder of phosphorus metabolism, unspecified 08/31/2020   DM (diabetes mellitus), type 2 with renal complications ([place]) 36/64/4034   Dyspnea     Encounter for screening for respiratory tuberculosis 08/30/2020   End stage renal disease ([place]) 09/07/2017   ESRD (end stage renal disease) ([place]) 08/27/2020   Essential (primary) hypertension 09/07/2017   Fever    History of endocarditis 01/09/2020   Hypercalcemia 10/08/2020   Hyperlipidemia, unspecified 09/07/2017   Hypertension    Hypokalemia 11/13/2021   Iron deficiency anemia, unspecified 08/30/2020   Low back pain 09/07/2017   MI (myocardial infarction) ([place])    MSSA bacteremia 09/10/2017   Non-ST elevation (NSTEMI) myocardial infarction ([place]) 01/09/2020   NSTEMI (non-ST elevated myocardial infarction) ([place]) 03/24/2019   Other specified arthritis, other site 08/31/2020   Pain, unspecified 08/30/2020   Personal history of other diseases of the circulatory system 08/31/2020   Presence of aortocoronary bypass graft 08/31/2020   Pruritus, unspecified 08/30/2020   Renal disorder    Restless legs syndrome 09/06/2020   S/P CABG x 4 04/03/2019   S/P Video Bronchoscopy, Right VATS with drainage of pleural effusion, decortication, intercostal nerve block 02/20/2020   Secondary hyperparathyroidism of renal origin ([place]) 08/30/2020   Thoracic aortic aneurysm (TAA) (HCC)    4.3 cm ascending TAA 02/02/20 CT   Type 2 diabetes mellitus with diabetic nephropathy ([place]) 08/30/2020    Past Surgical History:  Procedure Laterality Date   ABDOMINAL AORTIC ANEURYSM REPAIR     APPENDECTOMY     AV FISTULA PLACEMENT Right 10/18/2018   Procedure: BRACHIO-CEPHALIC ARTERIOVENOUS (AV) FISTULA CREATION;  Surgeon: Angelia Mould, MD;  Location: [place];  Service: Vascular;  Laterality: Right;   CARDIAC CATHETERIZATION     CORONARY ANGIOPLASTY     CORONARY ARTERY BYPASS GRAFT N/A 03/29/2019   Procedure: CORONARY ARTERY BYPASS GRAFTING (CABG) x 4, using left internal mammary artery and right leg greater saphenous vein harvested endoscopically;  Surgeon: Melrose Nakayama, MD;  Location: [place];  Service: Open  Heart Surgery;  Laterality: N/A;   CORONARY PRESSURE WIRE/FFR WITH 3D MAPPING N/A 03/27/2019   Procedure: Coronary Pressure Wire/FFR w/3D Mapping;  Surgeon: Jettie Booze, MD;  Location: [facility];  Service: Cardiovascular;  Laterality: N/A;   DECORTICATION Left 02/19/2020   Procedure: DECORTICATION;  Surgeon: Melrose Nakayama, MD;  Location: [place];  Service: Thoracic;  Laterality: Left;   INTERCOSTAL NERVE BLOCK Left 02/19/2020   Procedure: Intercostal Nerve Block;  Surgeon: Melrose Nakayama, MD;  Location: [place];  Service: Thoracic;  Laterality: Left;   IR FLUORO GUIDE CV LINE RIGHT  09/13/2017   IR REMOVAL TUN CV CATH W/O FL  10/22/2017   IR US GUIDE VASC ACCESS RIGHT  09/13/2017   LEFT HEART CATH AND CORONARY ANGIOGRAPHY N/A 03/27/2019   Procedure: LEFT HEART CATH AND CORONARY ANGIOGRAPHY;  Surgeon: Jettie Booze, MD;  Location: [place] [facility];  Service: Cardiovascular;  Laterality: N/A;   NECK SURGERY     PLEURAL EFFUSION DRAINAGE Left 02/19/2020   Procedure: DRAINAGE OF PLEURAL EFFUSION;  Surgeon: Melrose Nakayama, MD;  Location: [place] [facility];  Service: Thoracic;  Laterality: Left;   SMALL INTESTINE SURGERY     TEE WITHOUT CARDIOVERSION N/A 09/10/2017   Procedure: TRANSESOPHAGEAL ECHOCARDIOGRAM (TEE);  Surgeon: Skeet Latch, MD;  Location: [place];  Service: Cardiovascular;  Laterality: N/A;   TEE WITHOUT CARDIOVERSION N/A 03/29/2019   Procedure: TRANSESOPHAGEAL ECHOCARDIOGRAM (TEE);  Surgeon: Melrose Nakayama, MD;  Location: [place];  Service: Open Heart Surgery;  Laterality: N/A;   TEE WITHOUT CARDIOVERSION N/A 01/12/2020   Procedure: TRANSESOPHAGEAL ECHOCARDIOGRAM (TEE);  Surgeon: Sanda Klein, MD;  Location: [place];  Service: Cardiovascular;  Laterality: N/A;   VIDEO ASSISTED THORACOSCOPY Left 02/19/2020   Procedure: VIDEO ASSISTED THORACOSCOPY;  Surgeon: Melrose Nakayama, MD;  Location: [facility];  Service: Thoracic;  Laterality: Left;    VIDEO BRONCHOSCOPY N/A 02/19/2020   Procedure: VIDEO BRONCHOSCOPY;  Surgeon: Melrose Nakayama, MD;  Location: [facility];  Service: Thoracic;  Laterality: N/A;    Family History  Problem Relation Age of Onset   Cancer Maternal Grandmother     Social History Social History   Tobacco Use   Smoking status: Former    Packs/day: 2.00    Years: 30.00    Total pack years: 60.00    Types: Cigarettes    Start date: 1968    Quit date: 1998    Years since quitting: 25.6   Smokeless tobacco: Never  Vaping Use   Vaping Use: Never used  Substance Use Topics   Alcohol use: Yes    Comment: Occasionally.   Drug use: Never    Current Outpatient Medications  Medication Sig Dispense Refill   acetaminophen (TYLENOL) 500 MG tablet Take 1,500 mg by mouth 2 (two) times daily as needed for mild pain or moderate pain (pain).     albuterol (VENTOLIN HFA) 108 (90 Base) MCG/ACT inhaler TAKE 2 PUFFS BY MOUTH EVERY 6 HOURS AS NEEDED FOR WHEEZE OR SHORTNESS OF BREATH 6.7 g 3   albuterol (VENTOLIN HFA) 108 (90 Base) MCG/ACT inhaler Inhale 1-2 puffs into  the lungs every 6 (six) hours as needed for wheezing or shortness of breath. 1 each 3   aspirin EC 81 MG tablet Take 81 mg by mouth daily.     atorvastatin (LIPITOR) 40 MG tablet Take 40 mg by mouth daily in the afternoon.      AURYXIA 1 GM 210 MG(Fe) tablet Take 210 mg by mouth 3 (three) times daily.     b complex-vitamin c-folic acid (NEPHRO-VITE) 0.8 MG TABS tablet SMARTSIG:1 Tablet(s) By Mouth Every Evening     carvedilol (COREG) 3.125 MG tablet Take 1 tablet (3.125 mg total) by mouth 2 (two) times daily. 180 tablet 3   Dulaglutide 1.5 MG/0.5ML SOPN Inject 1.5 mg into the skin every Sunday.     ferric citrate (AURYXIA) 1 GM 210 MG(Fe) tablet Take by mouth.     ferrous sulfate 325 (65 FE) MG tablet Take 325 mg by mouth 2 (two) times daily with a meal.     fluticasone (FLONASE) 50 MCG/ACT nasal spray Place 1 spray into both nostrils daily as needed for  allergies or rhinitis.     gabapentin (NEURONTIN) 100 MG capsule Take 2 capsules (200 mg total) by mouth at bedtime.     glipiZIDE (GLUCOTROL XL) 2.5 MG 24 hr tablet Take 2.5 mg by mouth 2 (two) times daily.     linagliptin (TRADJENTA) 5 MG TABS tablet Take 5 mg by mouth daily.     Methoxy PEG-Epoetin Beta (MIRCERA IJ) Mircera     OVER THE COUNTER MEDICATION Place 1 drop into both eyes 2 (two) times daily as needed (dry eyes). Hyper Tears eye drops/Unknown strength     pantoprazole (PROTONIX) 20 MG tablet Take 1 tablet (20 mg total) by mouth daily. 21 tablet 0   predniSONE (STERAPRED UNI-PAK 21 TAB) 10 MG (21) TBPK tablet Take by mouth daily. Take 6 tabs by mouth daily  for 2 days, then 5 tabs for 2 days, then 4 tabs for 2 days, then 3 tabs for 2 days, 2 tabs for 2 days, then 1 tab by mouth daily for 2 days 42 tablet 0   rOPINIRole (REQUIP) 0.25 MG tablet Take 0.25 mg by mouth at bedtime.     SENSIPAR 30 MG tablet Take 30 mg by mouth daily.     sevelamer (RENAGEL) 800 MG tablet Take 1,600 mg by mouth 3 (three) times daily.     tamsulosin (FLOMAX) 0.4 MG CAPS capsule Take 0.4 mg by mouth daily.     zolpidem (AMBIEN) 10 MG tablet Take 10 mg by mouth at bedtime.     hydrALAZINE (APRESOLINE) 10 MG tablet Take 1 tablet (10 mg total) by mouth 3 (three) times daily. (Patient not taking: Reported on 07/28/2022) 270 tablet 3   Current Facility-Administered Medications  Medication Dose Route Frequency Provider Last Rate Last Admin   regadenoson (LEXISCAN) injection SOLN 0.4 mg  0.4 mg Intravenous Once Revankar, Reita Cliche, MD       technetium tetrofosmin (TC-MYOVIEW) injection 35.3 millicurie  29.9 millicurie Intravenous Once PRN Revankar, Reita Cliche, MD       technetium tetrofosmin (TC-MYOVIEW) injection 24.2 millicurie  68.3 millicurie Intravenous Once PRN Revankar, Reita Cliche, MD        Allergies  Allergen Reactions   Penicillins Rash and Other (See Comments)    Did it involve swelling of the  face/tongue/throat, SOB, or low BP? No Did it involve sudden or severe rash/hives, skin peeling, or any reaction on the inside of your mouth or  nose? Yes Did you need to seek medical attention at a hospital or doctor's office? Yes When did it last happen?      Childhood allergy If all above answers are "NO", may proceed with cephalosporin use.     Review of Systems  Constitutional:  Positive for fatigue. Negative for activity change and unexpected weight change.  HENT:  Positive for dental problem (Dentures) and hearing loss.   Respiratory:  Positive for cough.   Cardiovascular:  Negative for chest pain and leg swelling.  Musculoskeletal:  Positive for arthralgias and joint swelling.    BP 114/68 (BP Location: Left Arm, Patient Position: Sitting)   Pulse 87   Resp 18   Ht 5\' 11"  (1.803 m)   Wt 205 lb (93 kg)   SpO2 94% Comment: RA  BMI 28.59 kg/m  Physical Exam Vitals reviewed.  Constitutional:      General: He is not in acute distress.    Appearance: Normal appearance.  HENT:     Head: Normocephalic and atraumatic.  Eyes:     Extraocular Movements: Extraocular movements intact.  Neck:     Vascular: Carotid bruit present.  Cardiovascular:     Rate and Rhythm: Normal rate and regular rhythm.     Heart sounds: Normal heart sounds. No murmur heard. Pulmonary:     Effort: Pulmonary effort is normal. No respiratory distress.     Breath sounds: Normal breath sounds. No wheezing.     Comments: Cough with deep breathing Abdominal:     General: There is no distension.     Palpations: Abdomen is soft.  Skin:    General: Skin is warm and dry.  Neurological:     General: No focal deficit present.     Mental Status: He is alert and oriented to person, place, and time.     Cranial Nerves: No cranial nerve deficit.    Diagnostic Tests: CT ANGIOGRAPHY CHEST WITH CONTRAST   TECHNIQUE: Multidetector CT imaging of the chest was performed using the standard protocol during bolus  administration of intravenous contrast. Multiplanar CT image reconstructions and MIPs were obtained to evaluate the vascular anatomy.   RADIATION DOSE REDUCTION: This exam was performed according to the departmental dose-optimization program which includes automated exposure control, adjustment of the mA and/or kV according to patient size and/or use of iterative reconstruction technique.   CONTRAST:  14mL OMNIPAQUE IOHEXOL 350 MG/ML SOLN   COMPARISON:  None Available.   FINDINGS: Cardiovascular: There is adequate opacification the pulmonary arterial tree. No intraluminal filling defect identified to suggest acute pulmonary embolism. Central pulmonary arteries are enlarged in keeping with changes of pulmonary arterial hypertension.   Coronary artery bypass grafting has been performed. Mild global cardiomegaly is stable. No pericardial effusion. There is extensive atherosclerotic calcification within the thoracic aorta. Superimposed dilation of the thoracic aorta with the ascending aorta measuring 4.5 cm and the proximal descending thoracic aorta measuring 4.1 cm in greatest dimension. This appears stable since prior examination. Extensive atherosclerotic calcification is seen within the proximal left subclavian artery, however, the degree of stenosis is not well assessed on this examination due to phase of contrast administration.   Mediastinum/Nodes: Visualized thyroid is unremarkable. No pathologic thoracic adenopathy. The esophagus is unremarkable.   Lungs/Pleura: Parenchymal scarring with associated left-sided volume loss noted within the a left lung. Mild parenchymal scarring noted at the right lung base. Ground-glass pulmonary opacity has developed diffusely within the lungs likely reflecting atelectasis given the decreased pulmonary insufflation when compared  to prior examination. No pneumothorax or pleural effusion. Stable left pleural thickening.   Upper Abdomen: No  acute abnormality.   Musculoskeletal: Lytic lesion within the right humeral head is unchanged possibly representing a unicameral bone cyst or degenerative subchondral cyst. No acute bone abnormality.   Review of the MIP images confirms the above findings.   IMPRESSION: 1. No pulmonary embolism. 2. Stable mild global cardiomegaly. Status post coronary artery bypass grafting. 3. Stable dilation of the thoracic aorta, measuring 4.5 cm in greatest dimension within the ascending aorta and 4.1 cm in greatest dimension within the proximal descending thoracic aorta. Recommend semi-annual imaging followup by CTA or MRA and referral to cardiothoracic surgery if not already obtained. This recommendation follows 2010 ACCF/AHA/AATS/ACR/ASA/SCA/SCAI/SIR/STS/SVM Guidelines for the Diagnosis and Management of Patients With Thoracic Aortic Disease. Circulation. 2010; 121: R416-L845. Aortic aneurysm NOS (ICD10-I71.9) 4. Extensive atherosclerotic calcification within the proximal left subclavian artery, however, the degree of stenosis is not well assessed on this examination due to phase of contrast administration. If there is clinical evidence of vertebrobasilar insufficiency, CT arteriography may be more helpful for further evaluation.   Aortic Atherosclerosis (ICD10-I70.0).     Electronically Signed   By: Fidela Salisbury M.D.   On: 05/16/2022 20:43 I personally reviewed the CT images.  There is a 4.5 cm ascending aneurysm, 4.1 cm descending aneurysm, and severe thoracic aortic atherosclerosis.  There is coronary atherosclerosis.  Changes from previous CABG.  Some pleural thickening on the left.  Impression: John [name] is a 71 year old man with a history of CAD, non-ST elevation MI, CABG, end-stage renal disease, hemodialysis, hypertension, hyperlipidemia, type 2 diabetes, low back pain, arthritis, pleural effusion, and thoracic aortic atherosclerosis.  4.5 cm ascending aneurysm, 4.1 cm  descending aneurysm, thoracic aortic atherosclerosis-no indication for surgery at present.  Indications would be size greater than 5.5 cm or growth of greater than 5 mm in 6 months.  Does not meet either of those criteria.  Blood pressure control is the mainstay of treatment.  He is on Lipitor.  CAD-history of non-STEMI.  History of CABG.  No anginal symptoms at present.  Cough-being evaluated by Dr. Lamonte Sakai.  End stage renal disease- on hemodialysis  Plan: Return in 6 months with noncontrast CT  Melrose Nakayama, MD [facility] [phone]

## 2022-08-03 ENCOUNTER — Encounter: Payer: Self-pay | Admitting: Cardiology

## 2022-08-03 ENCOUNTER — Ambulatory Visit: Payer: Medicare HMO | Attending: Cardiology | Admitting: Cardiology

## 2022-08-03 VITALS — BP 110/58 | HR 100 | Ht 74.0 in | Wt 208.0 lb

## 2022-08-03 DIAGNOSIS — R400 Somnolence: Secondary | ICD-10-CM

## 2022-08-03 DIAGNOSIS — I255 Ischemic cardiomyopathy: Secondary | ICD-10-CM | POA: Diagnosis not present

## 2022-08-03 DIAGNOSIS — Z951 Presence of aortocoronary bypass graft: Secondary | ICD-10-CM

## 2022-08-03 DIAGNOSIS — I7121 Aneurysm of the ascending aorta, without rupture: Secondary | ICD-10-CM

## 2022-08-03 DIAGNOSIS — I10 Essential (primary) hypertension: Secondary | ICD-10-CM

## 2022-08-03 DIAGNOSIS — E1121 Type 2 diabetes mellitus with diabetic nephropathy: Secondary | ICD-10-CM | POA: Diagnosis not present

## 2022-08-03 DIAGNOSIS — R0609 Other forms of dyspnea: Secondary | ICD-10-CM

## 2022-08-03 DIAGNOSIS — N186 End stage renal disease: Secondary | ICD-10-CM

## 2022-08-03 NOTE — Addendum Note (Signed)
Addended by: Jacobo Forest D on: 08/03/2022 08:41 AM   Modules accepted: Orders
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
## 2022-08-03 NOTE — Patient Instructions (Addendum)
Medication Instructions:  Your physician recommends that you continue on your current medications as directed. Please refer to the Current Medication list given to you today.  *If you need a refill on your cardiac medications before your next appointment, please call your pharmacy*   Lab Work: None Ordered If you have labs (blood work) drawn today and your tests are completely normal, you will receive your results only by: [place] (if you have MyChart) OR A paper copy in the mail If you have any lab test that is abnormal or we need to change your treatment, we will call you to review the results.   Testing/Procedures: Your physician has requested that you have an echocardiogram. Echocardiography is a painless test that uses sound waves to create images of your heart. It provides your doctor with information about the size and shape of your heart and how well your heart's chambers and valves are working. This procedure takes approximately one hour. There are no restrictions for this procedure.   Itamar Sleep Study - Will send to [employer] and let you know when to pick up  Follow-Up: At [facility], you and your health needs are our priority.  As part of our continuing mission to provide you with exceptional heart care, we have created designated Provider Care Teams.  These Care Teams include your primary Cardiologist (physician) and Advanced Practice Providers (APPs -  Physician Assistants and Nurse Practitioners) who all work together to provide you with the care you need, when you need it.  We recommend signing up for the patient portal called "MyChart".  Sign up information is provided on this After Visit Summary.  MyChart is used to connect with patients for Virtual Visits (Telemedicine).  Patients are able to view lab/test results, encounter notes, upcoming appointments, etc.  Non-urgent messages can be sent to your provider as well.   To learn more about what you can do with  MyChart, go to [email or web address].    Your next appointment:   3 month(s)  The format for your next appointment:   In Person  Provider:   Jenne Campus, MD    Other Instructions NA

## 2022-08-03 NOTE — Progress Notes (Signed)
110/58

## 2022-08-04 ENCOUNTER — Telehealth: Payer: Self-pay | Admitting: *Deleted

## 2022-08-04 NOTE — Telephone Encounter (Signed)
Staff message sent to John [name] ok to activate itamar deivce.

## 2022-08-13 ENCOUNTER — Encounter (HOSPITAL_BASED_OUTPATIENT_CLINIC_OR_DEPARTMENT_OTHER): Payer: Medicare HMO | Admitting: Cardiology

## 2022-08-13 DIAGNOSIS — G4733 Obstructive sleep apnea (adult) (pediatric): Secondary | ICD-10-CM | POA: Diagnosis not present

## 2022-08-14 ENCOUNTER — Ambulatory Visit: Payer: Medicare HMO | Attending: Cardiology

## 2022-08-14 DIAGNOSIS — R0609 Other forms of dyspnea: Secondary | ICD-10-CM | POA: Diagnosis not present

## 2022-08-15 LAB — ECHOCARDIOGRAM COMPLETE
AR max vel: 2.88 cm2
AV Area VTI: 2.61 cm2
AV Area mean vel: 2.6 cm2
AV Mean grad: 7 mmHg
AV Peak grad: 11 mmHg
Ao pk vel: 1.66 m/s
Area-P 1/2: 4.15 cm2
Calc EF: 40.5 %
MV M vel: 4.63 m/s
MV Peak grad: 85.7 mmHg
Radius: 0.3 cm
S' Lateral: 5.4 cm
Single Plane A2C EF: 34.5 %
Single Plane A4C EF: 42.2 %

## 2022-08-17 ENCOUNTER — Ambulatory Visit: Payer: Medicare HMO | Attending: Cardiology

## 2022-08-17 DIAGNOSIS — R400 Somnolence: Secondary | ICD-10-CM

## 2022-08-17 NOTE — Procedures (Signed)
    SLEEP STUDY REPORT Patient Information Study Date: 08/13/22 Patient Name: John [name] Patient ID: [id number] Birth Date: Dec 22, 2050 Age: 71 Gender: Male BMI: 26.6 (W=207 lb, H=6' 2'') Referring Physician: Jenne Campus, MD  TEST DESCRIPTION: Home sleep apnea testing was completed using the WatchPat, a Type 1 device, utilizing peripheral arterial tonometry (PAT), chest movement, actigraphy, pulse oximetry, pulse rate, body position and snore. AHI was calculated with apnea and hypopnea using valid sleep time as the denominator. RDI includes apneas, hypopneas, and RERAs. The data acquired and the scoring of sleep and all associated events were performed in accordance with the recommended standards and specifications as outlined in the AASM Manual for the Scoring of Sleep and Associated Events 2.2.0 (2015).  FINDINGS:  1. Severe Obstructive Sleep Apnea with AHI 52.2/hr.  2. Severe Central Sleep Apnea with pAHIc 35.6/hr.  3. Oxygen desaturations as low as 80%.  4. Severe snoring was present. O2 sats were < 88% for 8.3 min.  5. Total sleep time was 3 hrs and 7 min.  6. 12% of total sleep time was spent in REM sleep.  7. Shortened sleep onset latency at 5 min.  8. Shortened REM sleep onset latency at 67 min.  9. Total awakenings were 52. 10. Arrhythmia detection: Suggestive of possible brief atrial fibrillation lasting 2 min and 7 sec. This is not diagnostic and further testing with outpatient telemetry monitoring is recommended.  DIAGNOSIS: Severe Obstructive Sleep Apnea (G47.33) Severe Central Sleep Apnea Nocturnal Hypoxemia Possible Atrial Fibrillation  RECOMMENDATIONS: 1. Clinical correlation of these findings is necessary. The decision to treat obstructive sleep apnea (OSA) is usually based on the presence of apnea symptoms or the presence of associated medical conditions such as Hypertension, Congestive Heart Failure, Atrial Fibrillation or Obesity. The most common  symptoms of OSA are snoring, gasping for breath while sleeping, daytime sleepiness and fatigue.  2. Initiating apnea therapy is recommended given the presence of symptoms and/or associated conditions. Recommend proceeding with one of the following:   a. Auto-CPAP therapy with a pressure range of 5-20cm H2O.   b. An oral appliance (OA) that can be obtained from certain dentists with expertise in sleep medicine. These are primarily of use in non-obese patients with mild and moderate disease.   c. An ENT consultation which may be useful to look for specific causes of obstruction and possible treatment options.   d. If patient is intolerant to PAP therapy, consider referral to ENT for evaluation for hypoglossal nerve stimulator.  3. Close follow-up is necessary to ensure success with CPAP or oral appliance therapy for maximum benefit .  4. A follow-up oximetry study on CPAP is recommended to assess the adequacy of therapy and determine the need for supplemental oxygen or the potential need for Bi-level therapy. An arterial blood gas to determine the adequacy of baseline ventilation and oxygenation should also be considered.  5. Healthy sleep recommendations include: adequate nightly sleep (normal 7-9 hrs/night), avoidance of caffeine after noon and alcohol near bedtime, and maintaining a sleep environment that is cool, dark and quiet.  6. Weight loss for overweight patients is recommended. Even modest amounts of weight loss can significantly improve the severity of sleep apnea.  7. Snoring recommendations include: weight loss where appropriate, side sleeping, and avoidance of alcohol before bed.  8. Operation of motor vehicle should be avoided when sleepy.  Signature: Fransico Him, MD; [facility]; [place], [place] Board of Sleep Medicine Electronically Signed: 08/17/22

## 2022-08-17 NOTE — Progress Notes (Addendum)
SLEEP STUDY REPORT Patient Information Study Date: 08/13/22 Patient Name: John [name] Patient ID: [id number] Birth Date: 02/25/2051 Age: 71 Gender: Male BMI: 26.6 (W=207 lb, H=6' 2'') Referring Physician: Jenne Campus, MD  TEST DESCRIPTION: Home sleep apnea testing was completed using the WatchPat, a Type 1 device, utilizing peripheral arterial tonometry (PAT), chest movement, actigraphy, pulse oximetry, pulse rate, body position and snore. AHI was calculated with apnea and hypopnea using valid sleep time as the denominator. RDI includes apneas, hypopneas, and RERAs. The data acquired and the scoring of sleep and all associated events were performed in accordance with the recommended standards and specifications as outlined in the AASM Manual for the Scoring of Sleep and Associated Events 2.2.0 (2015).  FINDINGS:  1. Severe Obstructive Sleep Apnea with AHI 52.2/hr.  2. Severe Central Sleep Apnea with pAHIc 35.6/hr.  3. Oxygen desaturations as low as 80%.  4. Severe snoring was present. O2 sats were < 88% for 8.3 min.  5. Total sleep time was 3 hrs and 7 min.  6. 12% of total sleep time was spent in REM sleep.  7. Shortened sleep onset latency at 5 min.  8. Shortened REM sleep onset latency at 67 min.  9. Total awakenings were 52. 10. Arrhythmia detection: Suggestive of possible brief atrial fibrillation lasting 2 min and 7 sec. This is not diagnostic and further testing with outpatient telemetry monitoring is recommended.  DIAGNOSIS: Severe Obstructive Sleep Apnea (G47.33) Severe Central Sleep Apnea Nocturnal Hypoxemia Possible Atrial Fibrillation  RECOMMENDATIONS: 1. Clinical correlation of these findings is necessary. The decision to treat obstructive sleep apnea (OSA) is usually based on the presence of apnea symptoms or the presence of associated medical conditions such as Hypertension, Congestive Heart Failure, Atrial Fibrillation or Obesity. The most common  symptoms of OSA are snoring, gasping for breath while sleeping, daytime sleepiness and fatigue.  2. Initiating apnea therapy is recommended given the presence of symptoms and/or associated conditions. Recommend proceeding with one of the following:   a. Auto-CPAP therapy with a pressure range of 5-20cm H2O.   b. An oral appliance (OA) that can be obtained from certain dentists with expertise in sleep medicine. These are primarily of use in non-obese patients with mild and moderate disease.   c. An ENT consultation which may be useful to look for specific causes of obstruction and possible treatment options.   d. If patient is intolerant to PAP therapy, consider referral to ENT for evaluation for hypoglossal nerve stimulator.   3. Close follow-up is necessary to ensure success with CPAP or oral appliance therapy for maximum benefit .  4. A follow-up oximetry study on CPAP is recommended to assess the adequacy of therapy and determine the need for supplemental oxygen or the potential need for Bi-level therapy. An arterial blood gas to determine the adequacy of baseline ventilation and oxygenation should also be considered.  5. Healthy sleep recommendations include: adequate nightly sleep (normal 7-9 hrs/night), avoidance of caffeine after noon and alcohol near bedtime, and maintaining a sleep environment that is cool, dark and quiet.  6. Weight loss for overweight patients is recommended. Even modest amounts of weight loss can significantly improve the severity of sleep apnea.  7. Snoring recommendations include: weight loss where appropriate, side sleeping, and avoidance of alcohol before bed.  8. Operation of motor vehicle should be avoided when sleepy.  9.  Consider outpt telemetry monitoring to assess for atrial fibrillation.   Signature: Fransico Him, MD; [facility]; [place], [place] Board of  Sleep Medicine Electronically Signed: 08/17/22

## 2022-08-18 ENCOUNTER — Other Ambulatory Visit: Payer: Self-pay | Admitting: Cardiology

## 2022-08-18 ENCOUNTER — Telehealth: Payer: Self-pay | Admitting: *Deleted

## 2022-08-18 DIAGNOSIS — G4731 Primary central sleep apnea: Secondary | ICD-10-CM

## 2022-08-18 DIAGNOSIS — G4733 Obstructive sleep apnea (adult) (pediatric): Secondary | ICD-10-CM

## 2022-08-18 DIAGNOSIS — G4736 Sleep related hypoventilation in conditions classified elsewhere: Secondary | ICD-10-CM

## 2022-08-18 NOTE — Telephone Encounter (Signed)
Patient returned a call to me and was given sleep study results and recommendations. He agrees to proceed with CPAP titration.

## 2022-08-18 NOTE — Telephone Encounter (Signed)
Left message to return a call to discuss sleep study results and recommendations. 

## 2022-08-18 NOTE — Telephone Encounter (Signed)
Prior Authorization for CPAP titration sent to [facility] via web portal. Auth approval Number [id number].  Valid dates 08/25/22 to 11/23/22.

## 2022-08-18 NOTE — Telephone Encounter (Signed)
-----   Message from Sueanne Margarita, MD sent at 08/17/2022  1:16 PM EDT ----- Please let patient know that they have sleep apnea.  Recommend therapeutic CPAP titration ASAP for treatment of patient's sleep disordered breathing.  If unable to perform an in lab titration then initiate ResMed auto CPAP from 4 to 15cm H2O with heated humidity and mask of choice and overnight pulse ox on CPAP.

## 2022-08-20 ENCOUNTER — Telehealth: Payer: Self-pay

## 2022-08-20 NOTE — Telephone Encounter (Signed)
-----   Message from Park Liter, MD sent at 08/18/2022  1:31 PM EDT ----- Echocardiogram showed worsening of left ventricle ejection fraction, he need to have a follow-up with me within next few weeks

## 2022-08-20 NOTE — Telephone Encounter (Signed)
Patient notified and schedule on Monday.

## 2022-08-21 ENCOUNTER — Encounter (HOSPITAL_COMMUNITY): Payer: Self-pay | Admitting: Internal Medicine

## 2022-08-21 ENCOUNTER — Ambulatory Visit (HOSPITAL_COMMUNITY)
Admission: RE | Admit: 2022-08-21 | Discharge: 2022-08-21 | Disposition: A | Discharge status: Home / Self Care | Payer: Medicare HMO | Source: Ambulatory Visit | Attending: Internal Medicine | Admitting: Internal Medicine

## 2022-08-21 VITALS — BP 112/66 | HR 88 | Wt 208.4 lb

## 2022-08-21 DIAGNOSIS — I953 Hypotension of hemodialysis: Secondary | ICD-10-CM | POA: Diagnosis not present

## 2022-08-21 DIAGNOSIS — I255 Ischemic cardiomyopathy: Secondary | ICD-10-CM | POA: Insufficient documentation

## 2022-08-21 DIAGNOSIS — I132 Hypertensive heart and chronic kidney disease with heart failure and with stage 5 chronic kidney disease, or end stage renal disease: Secondary | ICD-10-CM | POA: Diagnosis not present

## 2022-08-21 DIAGNOSIS — I5022 Chronic systolic (congestive) heart failure: Secondary | ICD-10-CM | POA: Insufficient documentation

## 2022-08-21 DIAGNOSIS — N186 End stage renal disease: Secondary | ICD-10-CM | POA: Insufficient documentation

## 2022-08-21 DIAGNOSIS — I2510 Atherosclerotic heart disease of native coronary artery without angina pectoris: Secondary | ICD-10-CM | POA: Diagnosis not present

## 2022-08-21 DIAGNOSIS — R0602 Shortness of breath: Secondary | ICD-10-CM | POA: Insufficient documentation

## 2022-08-21 DIAGNOSIS — E1122 Type 2 diabetes mellitus with diabetic chronic kidney disease: Secondary | ICD-10-CM | POA: Diagnosis not present

## 2022-08-21 DIAGNOSIS — Z992 Dependence on renal dialysis: Secondary | ICD-10-CM | POA: Insufficient documentation

## 2022-08-21 DIAGNOSIS — Z951 Presence of aortocoronary bypass graft: Secondary | ICD-10-CM | POA: Insufficient documentation

## 2022-08-21 DIAGNOSIS — R053 Chronic cough: Secondary | ICD-10-CM | POA: Insufficient documentation

## 2022-08-21 DIAGNOSIS — I493 Ventricular premature depolarization: Secondary | ICD-10-CM | POA: Diagnosis not present

## 2022-08-21 NOTE — Progress Notes (Signed)
   ReDS Vest / Clip - 08/21/22 1300       ReDS Vest / Clip   Station Marker C    Ruler Value 29.5    ReDS Value Range High volume overload    ReDS Actual Value 50
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
## 2022-08-21 NOTE — Patient Instructions (Signed)
There has been no changes to your medications  You have been ordered a PYP Scan.  This is done in the Radiology Department of [facility]  When you come for this test please plan to be there 2-3 hours. ONCE APPROVED BY INSURANCE YOU WILL BE CALLED TO ARRANGE THE TEST   Your physician recommends that you schedule a follow-up appointment in: 4 months ( January 2024)  ** please call the office in November to arrange the follow up appointment **  If you have any questions or concerns before your next appointment please send [place] a message through [place] or call our office at [phone].    TO LEAVE A MESSAGE FOR THE NURSE SELECT OPTION 2, PLEASE LEAVE A MESSAGE INCLUDING: YOUR NAME DATE OF BIRTH CALL BACK NUMBER REASON FOR CALL**this is important as we prioritize the call backs  YOU WILL RECEIVE A CALL BACK THE SAME DAY AS LONG AS YOU CALL BEFORE 4:00 PM  At the [facility], you and your health needs are our priority. As part of our continuing mission to provide you with exceptional heart care, we have created designated Provider Care Teams. These Care Teams include your primary Cardiologist (physician) and Advanced Practice Providers (APPs- Physician Assistants and Nurse Practitioners) who all work together to provide you with the care you need, when you need it.   You may see any of the following providers on your designated Care Team at your next follow up: Dr Glori Bickers Dr Loralie Champagne Dr. Roxana Hires, NP Lyda Jester, [place] [facility] [place], [place] Forestine Na, NP Audry Riles, PharmD   Please be sure to bring in all your medications bottles to every appointment.

## 2022-08-24 ENCOUNTER — Ambulatory Visit: Payer: Medicare HMO | Attending: Cardiology | Admitting: Cardiology

## 2022-08-24 ENCOUNTER — Encounter: Payer: Self-pay | Admitting: Cardiology

## 2022-08-24 VITALS — BP 120/62 | HR 91 | Ht 74.0 in | Wt 207.6 lb

## 2022-08-24 DIAGNOSIS — Z951 Presence of aortocoronary bypass graft: Secondary | ICD-10-CM | POA: Diagnosis not present

## 2022-08-24 DIAGNOSIS — I255 Ischemic cardiomyopathy: Secondary | ICD-10-CM | POA: Diagnosis not present

## 2022-08-24 DIAGNOSIS — E119 Type 2 diabetes mellitus without complications: Secondary | ICD-10-CM

## 2022-08-24 DIAGNOSIS — I10 Essential (primary) hypertension: Secondary | ICD-10-CM | POA: Diagnosis not present

## 2022-08-24 NOTE — Patient Instructions (Signed)

## 2022-08-24 NOTE — Progress Notes (Signed)
Cardiology Office Note:    Date:  08/24/2022   ID:  FIELD John [name], DOB February 01, 1951, MRN [id number]  PCP:  Rhea Bleacher, NP  Cardiologist:  Jenne Campus, MD    Referring MD: Rhea Bleacher, NP   Chief Complaint  Patient presents with   abnormal Echo  I am not feeling well  History of Present Illness:    John [name] is a 71 y.o. male   history of coronary artery disease with prior PCI to the RCA, type 2 diabetes, hypertension, PAD status post aortofemoral bypass, end-stage IV chronic kidney disease.  He was found to have elevated troponin, and therefore cardiac catheterization was completed on 03/27/2019.   Cardiac cath revealed severe multivessel disease to include 90% mid RCA lesion, ostial circumflex to proximal circumflex lesion 80% stenosed, proximal LAD to mid LAD 75% stenosed with markedly positive DFR 0.71, first diagonal was 70% stenosed, proximal RCA to mid RCA was 35% stenosed, distal RCA 1 lesion 40% stenosis was prior stent revealing mild in-stent restenosis, distal RCA to lesion 80% stenosed.  The patient was referred to CVTS for surgical consultation for CABG.   The patient underwent a four-vessel CABG on 03/29/2019 with LIMA to LAD, SVG to diagonal, SVG to OM, and SVG to PDA.  He had endoscopic harvesting of the greater saphenous vein from his right leg.  Postoperatively he had thrombocytopenia on Lovenox and this was discontinued, he was also found to have postoperative CHF and was diuresed aggressively.  He was followed by nephrology throughout hospital course.  He was discharged on 04/03/2019   As a part of evaluation he had a chest x-ray done which showed some loculated large fluid in the left pleura, also echocardiogram being done under some suspicion for endocarditis of the aortic valve. TEE done did not confirm presence of active endocarditis.  He did have some calcification of the leaflet of the aortic valve. He was also find to have chronic pleural  effusion on the 02/19/2020 he did have video bronchoscopy done as well as left VATS with drainage of empyema as well as decortication and intercostal nerve block.   He is chronic renal failure progressively got worse until October when he started hemodialysis. He did have AV fistula placed many months ago and that fistula was ready to be used. In October 2022 he had echocardiogram done because of dyspnea on exertion he was identified to have worsening of coronary artery disease his ejection fraction went to 35 to 40%.  Stress test has been done to rule out ischemia as potential cause of his worsening of ejection fraction, however did not show any significant ischemia I am gradually try to put him on guideline directed medical therapy however to allow multiple difficulties because problem blood pressure being low.  He is already on dialysis.  He had repeated echocardiogram done in the spring of this year and there is no improvement left ventricle ejection fraction. Comes today 2 months of follow-up he is not feeling well.  He complained of being weak tired exhausted.  On Friday he did see our advanced congestive heart failure clinic.  I appreciate any input.  Past Medical History:  Diagnosis Date   Allergy, unspecified, initial encounter 08/30/2020   Anaphylactic shock, unspecified, initial encounter 08/30/2020   Anemia in chronic kidney disease 08/30/2020   Arthritis    Back pain 09/07/2017   Carotid artery stenosis    81-44% LICA, 8-18% RICA 03/28/30 [place]   Chest pain 03/24/2019  Chronic infective endocarditis    Chronic kidney disease (CKD), stage IV (severe) ([place]) 12/01/2018   CKD (chronic kidney disease), stage V ([place]) 03/24/2019   Coronary artery disease    Dependence on renal dialysis ([place]) 08/31/2020   Diabetes mellitus without complication (HCC)    Disorder of phosphorus metabolism, unspecified 08/31/2020   DM (diabetes mellitus), type 2 with renal complications ([place]) 17/40/8144   Dyspnea     Encounter for screening for respiratory tuberculosis 08/30/2020   End stage renal disease ([place]) 09/07/2017   ESRD (end stage renal disease) ([place]) 08/27/2020   Essential (primary) hypertension 09/07/2017   Fever    History of endocarditis 01/09/2020   Hypercalcemia 10/08/2020   Hyperlipidemia, unspecified 09/07/2017   Hypertension    Hypokalemia 11/13/2021   Iron deficiency anemia, unspecified 08/30/2020   Low back pain 09/07/2017   MI (myocardial infarction) ([place])    MSSA bacteremia 09/10/2017   Non-ST elevation (NSTEMI) myocardial infarction ([place]) 01/09/2020   NSTEMI (non-ST elevated myocardial infarction) ([place]) 03/24/2019   Other specified arthritis, other site 08/31/2020   Pain, unspecified 08/30/2020   Personal history of other diseases of the circulatory system 08/31/2020   Presence of aortocoronary bypass graft 08/31/2020   Pruritus, unspecified 08/30/2020   Renal disorder    Restless legs syndrome 09/06/2020   S/P CABG x 4 04/03/2019   S/P Video Bronchoscopy, Right VATS with drainage of pleural effusion, decortication, intercostal nerve block 02/20/2020   Secondary hyperparathyroidism of renal origin ([place]) 08/30/2020   Thoracic aortic aneurysm (TAA) (HCC)    4.3 cm ascending TAA 02/02/20 CT   Type 2 diabetes mellitus with diabetic nephropathy ([place]) 08/30/2020    Past Surgical History:  Procedure Laterality Date   ABDOMINAL AORTIC ANEURYSM REPAIR     APPENDECTOMY     AV FISTULA PLACEMENT Right 10/18/2018   Procedure: BRACHIO-CEPHALIC ARTERIOVENOUS (AV) FISTULA CREATION;  Surgeon: Angelia Mould, MD;  Location: [place];  Service: Vascular;  Laterality: Right;   CARDIAC CATHETERIZATION     CORONARY ANGIOPLASTY     CORONARY ARTERY BYPASS GRAFT N/A 03/29/2019   Procedure: CORONARY ARTERY BYPASS GRAFTING (CABG) x 4, using left internal mammary artery and right leg greater saphenous vein harvested endoscopically;  Surgeon: Melrose Nakayama, MD;  Location: [facility];  Service: Open Heart  Surgery;  Laterality: N/A;   CORONARY PRESSURE WIRE/FFR WITH 3D MAPPING N/A 03/27/2019   Procedure: Coronary Pressure Wire/FFR w/3D Mapping;  Surgeon: Jettie Booze, MD;  Location: [place] [facility];  Service: Cardiovascular;  Laterality: N/A;   DECORTICATION Left 02/19/2020   Procedure: DECORTICATION;  Surgeon: Melrose Nakayama, MD;  Location: [place];  Service: Thoracic;  Laterality: Left;   INTERCOSTAL NERVE BLOCK Left 02/19/2020   Procedure: Intercostal Nerve Block;  Surgeon: Melrose Nakayama, MD;  Location: [place];  Service: Thoracic;  Laterality: Left;   IR FLUORO GUIDE CV LINE RIGHT  09/13/2017   IR REMOVAL TUN CV CATH W/O FL  10/22/2017   IR US GUIDE VASC ACCESS RIGHT  09/13/2017   LEFT HEART CATH AND CORONARY ANGIOGRAPHY N/A 03/27/2019   Procedure: LEFT HEART CATH AND CORONARY ANGIOGRAPHY;  Surgeon: Jettie Booze, MD;  Location: [place] [facility];  Service: Cardiovascular;  Laterality: N/A;   NECK SURGERY     PLEURAL EFFUSION DRAINAGE Left 02/19/2020   Procedure: DRAINAGE OF PLEURAL EFFUSION;  Surgeon: Melrose Nakayama, MD;  Location: [place];  Service: Thoracic;  Laterality: Left;   SMALL INTESTINE SURGERY  TEE WITHOUT CARDIOVERSION N/A 09/10/2017   Procedure: TRANSESOPHAGEAL ECHOCARDIOGRAM (TEE);  Surgeon: Skeet Latch, MD;  Location: [place];  Service: Cardiovascular;  Laterality: N/A;   TEE WITHOUT CARDIOVERSION N/A 03/29/2019   Procedure: TRANSESOPHAGEAL ECHOCARDIOGRAM (TEE);  Surgeon: Melrose Nakayama, MD;  Location: [place];  Service: Open Heart Surgery;  Laterality: N/A;   TEE WITHOUT CARDIOVERSION N/A 01/12/2020   Procedure: TRANSESOPHAGEAL ECHOCARDIOGRAM (TEE);  Surgeon: Sanda Klein, MD;  Location: [place];  Service: Cardiovascular;  Laterality: N/A;   VIDEO ASSISTED THORACOSCOPY Left 02/19/2020   Procedure: VIDEO ASSISTED THORACOSCOPY;  Surgeon: Melrose Nakayama, MD;  Location: [place];  Service: Thoracic;  Laterality: Left;    VIDEO BRONCHOSCOPY N/A 02/19/2020   Procedure: VIDEO BRONCHOSCOPY;  Surgeon: Melrose Nakayama, MD;  Location: [place];  Service: Thoracic;  Laterality: N/A;    Current Medications: Current Meds  Medication Sig   acetaminophen (TYLENOL) 500 MG tablet Take 1,500 mg by mouth 2 (two) times daily as needed for mild pain or moderate pain (pain).   albuterol (VENTOLIN HFA) 108 (90 Base) MCG/ACT inhaler Inhale 1-2 puffs into the lungs every 6 (six) hours as needed for wheezing or shortness of breath.   aspirin EC 81 MG tablet Take 81 mg by mouth daily.   atorvastatin (LIPITOR) 40 MG tablet Take 40 mg by mouth daily in the afternoon.    AURYXIA 1 GM 210 MG(Fe) tablet Take 210 mg by mouth 3 (three) times daily.   Dulaglutide 1.5 MG/0.5ML SOPN Inject 1.5 mg into the skin every Sunday.   ferric citrate (AURYXIA) 1 GM 210 MG(Fe) tablet Take 210 mg by mouth 3 (three) times daily with meals.   fluticasone (FLONASE) 50 MCG/ACT nasal spray Place 1 spray into both nostrils daily as needed for allergies or rhinitis.   gabapentin (NEURONTIN) 100 MG capsule Take 2 capsules (200 mg total) by mouth at bedtime.   glipiZIDE (GLUCOTROL XL) 2.5 MG 24 hr tablet Take 2.5 mg by mouth 2 (two) times daily.   hydrALAZINE (APRESOLINE) 10 MG tablet Take 1 tablet (10 mg total) by mouth 3 (three) times daily.   linagliptin (TRADJENTA) 5 MG TABS tablet Take 5 mg by mouth daily.   Methoxy PEG-Epoetin Beta (MIRCERA IJ) Inject 1 Dose as directed See admin instructions. MONTHLY   OVER THE COUNTER MEDICATION Place 1 drop into both eyes 2 (two) times daily as needed (dry eyes). Hyper Tears eye drops/Unknown strength   rOPINIRole (REQUIP) 0.25 MG tablet Take 0.25 mg by mouth at bedtime.   SENSIPAR 30 MG tablet Take 30 mg by mouth daily.   sevelamer (RENAGEL) 800 MG tablet Take 1,600 mg by mouth 3 (three) times daily.   tamsulosin (FLOMAX) 0.4 MG CAPS capsule Take 0.4 mg by mouth daily.   zolpidem (AMBIEN) 10 MG tablet Take 10 mg by  mouth at bedtime.   Current Facility-Administered Medications for the 08/24/22 encounter (Office Visit) with Park Liter, MD  Medication   regadenoson (LEXISCAN) injection SOLN 0.4 mg   technetium tetrofosmin (TC-MYOVIEW) injection 18.8 millicurie   technetium tetrofosmin (TC-MYOVIEW) injection 41.6 millicurie     Allergies:   Penicillins   Social History   Socioeconomic History   Marital status: Married    Spouse name: Not on file   Number of children: Not on file   Years of education: Not on file   Highest education level: Not on file  Occupational History   Not on file  Tobacco Use   Smoking status: Former  Packs/day: 2.00    Years: 30.00    Total pack years: 60.00    Types: Cigarettes    Start date: 94    Quit date: 1998    Years since quitting: 25.7   Smokeless tobacco: Never  Vaping Use   Vaping Use: Never used  Substance and Sexual Activity   Alcohol use: Yes    Comment: Occasionally.   Drug use: Never   Sexual activity: Not on file  Other Topics Concern   Not on file  Social History Narrative   Not on file   Social Determinants of Health   Financial Resource Strain: Not on file  Food Insecurity: Not on file  Transportation Needs: Not on file  Physical Activity: Not on file  Stress: Not on file  Social Connections: Not on file     Family History: The patient's family history includes Cancer in his maternal grandmother. ROS:   Please see the history of present illness.    All 14 point review of systems negative except as described per history of present illness  EKGs/Labs/Other Studies Reviewed:      Recent Labs: 05/16/2022: ALT 16; BUN 13; Creatinine, Ser 5.05; Hemoglobin 9.6; Magnesium 2.2; Platelets 106; Potassium 3.7; Sodium 139  Recent Lipid Panel    Component Value Date/Time   CHOL 86 (L) 01/02/2020 0952   TRIG 41 01/02/2020 0952   HDL 50 01/02/2020 0952   CHOLHDL 1.7 01/02/2020 0952   CHOLHDL 2.5 03/25/2019 0454   VLDL 16  03/25/2019 0454   LDLCALC 24 01/02/2020 0952    Physical Exam:    VS:  BP 120/62 (BP Location: Left Arm, Patient Position: Sitting)   Pulse 91   Ht 6\' 2"  (1.88 m)   Wt 207 lb 9.6 oz (94.2 kg)   SpO2 96%   BMI 26.65 kg/m     Wt Readings from Last 3 Encounters:  08/24/22 207 lb 9.6 oz (94.2 kg)  08/21/22 208 lb 6.4 oz (94.5 kg)  08/03/22 208 lb (94.3 kg)     GEN:  Well nourished, well developed in no acute distress HEENT: Normal NECK: No JVD; No carotid bruits LYMPHATICS: No lymphadenopathy CARDIAC: RRR, no murmurs, no rubs, no gallops RESPIRATORY:  Clear to auscultation without rales, wheezing or rhonchi  ABDOMEN: Soft, non-tender, non-distended MUSCULOSKELETAL:  No edema; No deformity  SKIN: Warm and dry LOWER EXTREMITIES: no swelling NEUROLOGIC:  Alert and oriented x 3 PSYCHIATRIC:  Normal affect   ASSESSMENT:    1. Ischemic cardiomyopathy   2. S/P CABG x 4   3. Diabetes mellitus without complication ([place])   4. Essential (primary) hypertension    PLAN:    In order of problems listed above:  Ischemic cardiomyopathy.  Ejection fraction worsening now 20 to 25%.  He was seen by advanced congestive heart failure.  We will do a scan for amyloidosis.  He will consider bowel stim stimulation.  Also apparently there was discussion with nephrologist to give midodrine during dialysis to the patient and then try to intensify diuresis to make him feel better.  Overall it is a very difficult situation.  Not too many options left. Status post coronary bypass graft, coronary artery disease, last stress test negative. Diabetes mellitus that being followed by internal medicine team I do see her hemoglobin A1c of 6.3. Dyslipidemia, he is on high intense statin in form of Lipitor 40 which I will continue. I did review the note by our advanced congestive heart failure clinic for this visit.  Medication Adjustments/Labs and Tests Ordered: Current medicines are reviewed at length with  the patient today.  Concerns regarding medicines are outlined above.  No orders of the defined types were placed in this encounter.  Medication changes: No orders of the defined types were placed in this encounter.   Signed, Park Liter, MD, [facility] 08/24/2022 8:19 AM    [place]

## 2022-08-27 LAB — MULTIPLE MYELOMA PANEL, SERUM
Albumin SerPl Elph-Mcnc: 4.1 g/dL (ref 2.9–4.4)
Albumin/Glob SerPl: 1.7 (ref 0.7–1.7)
Alpha 1: 0.2 g/dL (ref 0.0–0.4)
Alpha2 Glob SerPl Elph-Mcnc: 0.7 g/dL (ref 0.4–1.0)
B-Globulin SerPl Elph-Mcnc: 0.9 g/dL (ref 0.7–1.3)
Gamma Glob SerPl Elph-Mcnc: 0.7 g/dL (ref 0.4–1.8)
Globulin, Total: 2.5 g/dL (ref 2.2–3.9)
IgA: 264 mg/dL (ref 61–437)
IgG (Immunoglobin G), Serum: 864 mg/dL (ref 603–1613)
IgM (Immunoglobulin M), Srm: 31 mg/dL (ref 20–172)
Total Protein ELP: 6.6 g/dL (ref 6.0–8.5)

## 2022-09-01 ENCOUNTER — Ambulatory Visit (HOSPITAL_BASED_OUTPATIENT_CLINIC_OR_DEPARTMENT_OTHER): Payer: Medicare HMO | Attending: Cardiology | Admitting: Cardiology

## 2022-09-01 VITALS — Ht 74.0 in | Wt 205.0 lb

## 2022-09-01 DIAGNOSIS — G4733 Obstructive sleep apnea (adult) (pediatric): Secondary | ICD-10-CM | POA: Diagnosis present

## 2022-09-02 NOTE — Procedures (Signed)
   Patient Name: John [name], John [name] Date: 09/01/2022 Gender: Male D.O.B: 05-01-1951 Age (years): 43 Referring Provider: Fransico Him MD, ABSM Height (inches): 74 Interpreting Physician: Fransico Him MD, ABSM Weight (lbs): 205 RPSGT: Zadie Rhine BMI: 26 MRN: [id number] Neck Size: 16.00  CLINICAL INFORMATION The patient is referred for a CPAP titration to treat sleep apnea.  SLEEP STUDY TECHNIQUE As per the AASM Manual for the Scoring of Sleep and Associated Events v2.3 (April 2016) with a hypopnea requiring 4% desaturations.  The channels recorded and monitored were frontal, central and occipital EEG, electrooculogram (EOG), submentalis EMG (chin), nasal and oral airflow, thoracic and abdominal wall motion, anterior tibialis EMG, snore microphone, electrocardiogram, and pulse oximetry. Continuous positive airway pressure (CPAP) was initiated at the beginning of the study and titrated to treat sleep-disordered breathing.  MEDICATIONS Medications self-administered by patient taken the night of the study : NEURONTIN, [place], [place] Comments added by technician: No restroom visted. Patient had difficulty initiating sleep. Patient was restless all through the night. Comments added by scorer: N/A  RESPIRATORY PARAMETERS Optimal PAP Pressure (cm): 15  AHI at Optimal Pressure (/hr):0 Overall Minimal O2 (%):88.0  Supine % at Optimal Pressure (%):100 Minimal O2 at Optimal Pressure (%): 94.0   SLEEP ARCHITECTURE The study was initiated at 9:45:50 PM and ended at 4:33:28 AM.  Sleep onset time was 73.8 minutes and the sleep efficiency was 62.6%. The total sleep time was 255.4 minutes.  The patient spent 4.3% of the night in stage N1 sleep, 78.3% in stage N2 sleep, 3.7% in stage N3 and 13.7% in REM.Stage REM latency was 211.5 minutes  Wake after sleep onset was 78.5. Alpha intrusion was absent. Supine sleep was 73.18%.  CARDIAC DATA The 2 lead EKG  demonstrated sinus rhythm. The mean heart rate was 81.7 beats per minute. Other EKG findings include: None.  LEG MOVEMENT DATA The total Periodic Limb Movements of Sleep (PLMS) were 0. The PLMS index was 0.0. A PLMS index of <15 is considered normal in adults.  IMPRESSIONS - The optimal PAP pressure was 15 cm of water. - Central sleep apnea was not noted during this titration (CAI = 0.2/h). - Mild oxygen desaturations were observed during this titration (min O2 = 88.0%). - No snoring was audible during this study. - No cardiac abnormalities were observed during this study. - Clinically significant periodic limb movements were not noted during this study. Arousals associated with PLMs were significant.  DIAGNOSIS - Obstructive Sleep Apnea (G47.33)  RECOMMENDATIONS - Trial of auto CPAP therapy from 4 to 15 cm H2O with a Large size Fisher&Paykel Full Face Simplus mask and heated humidification. - Avoid alcohol, sedatives and other CNS depressants that may worsen sleep apnea and disrupt normal sleep architecture. - Sleep hygiene should be reviewed to assess factors that may improve sleep quality. - Weight management and regular exercise should be initiated or continued. - Return to Sleep Center for re-evaluation after 6 weeks of therapy  [Electronically signed] 09/02/2022 09:17 AM  Fransico Him MD, ABSM Diplomate, American Board of Sleep Medicine

## 2022-09-03 ENCOUNTER — Ambulatory Visit (INDEPENDENT_AMBULATORY_CARE_PROVIDER_SITE_OTHER): Payer: Medicare HMO | Admitting: Emergency Medicine

## 2022-09-03 ENCOUNTER — Encounter: Payer: Self-pay | Admitting: Emergency Medicine

## 2022-09-03 DIAGNOSIS — R053 Chronic cough: Secondary | ICD-10-CM

## 2022-09-03 DIAGNOSIS — R0602 Shortness of breath: Secondary | ICD-10-CM

## 2022-09-03 MED ORDER — SPIRIVA RESPIMAT 1.25 MCG/ACT IN AERS: 2.0000 | INHALATION_SPRAY | Freq: Every day | RESPIRATORY_TRACT | 0 refills | Status: DC

## 2022-09-03 MED ORDER — PANTOPRAZOLE SODIUM 40 MG PO TBEC: 40.0000 mg | DELAYED_RELEASE_TABLET | Freq: Every day | ORAL | 5 refills | Status: DC

## 2022-09-03 NOTE — Assessment & Plan Note (Signed)
Again multifactorial with cardiac in cardiorenal contributors but suspect that he may have some degree of COPD.  He did not get his PFT today but I think it is okay for [place] to empirically try Spiriva Respimat as above.  We can review in a couple months and see if he has benefit.  If so we will continue it going forward.  He will keep albuterol available to use if needed.

## 2022-09-03 NOTE — Patient Instructions (Addendum)
We will try starting Spiriva Respimat 2 puffs once daily. We will try starting pantoprazole 40 mg once daily.  Take this medication 1 hour around food. Keep your albuterol available to use 2 puffs if needed for short breath, chest tightness, wheezing or spells of coughing. Continue to follow with cardiology and nephrology as planned. Agree with continuing CPAP therapy at night Follow with Dr. Lamonte Sakai in 2 months or sooner if you have any problems or next available

## 2022-09-03 NOTE — Assessment & Plan Note (Signed)
Question whether there may be a component of volume overload that contribute to his cough but suspect that this is upper airway in nature.  He does have some intermittent rhinitis but does not use Flonase reliably.  Discussed this with him today.  He may have some silent GERD that is a contributor and we will try to treat this.  Finally question whether he has underlying obstructive lung disease that might be contributing.  We will add Spiriva Respimat

## 2022-09-03 NOTE — Addendum Note (Signed)
Addended byOralia Rud M on: 09/03/2022 04:34 PM   Modules accepted: Orders

## 2022-09-03 NOTE — Progress Notes (Signed)
Subjective:    Patient ID: John [name], male    DOB: Nov 27, 1950, 71 y.o.   MRN: [id number]  HPI 71 year old man, former smoker (60 pack years) with a history of CAD/CABG, chronic renal failure on HD, hypertension, diabetes, history of MSSA bacteremia and endocarditis, left VATS drainage of a pleural effusion.  He is referred today for cough and shortness of breath. He had a TTE in March that shows decreased LV fxn to 35%.  He has started to have cough beginning about 2 months ago. He has been waked from sleep with cough and SOB. He can also have exertional SOB when he is walking, working. He uses albuterol frequently through the day. He has increased nasal congestion and mucous over the last week. Occasional reflux sx. He has tried scheduled BD before but he isn't sure that it has helped him. Has flonase that he takes qod.   CT-PA 05/16/22 reviewed by me showed no PE, stable TAA, atelectasis and chronic L sided volume loss  ROV 09/03/22 --follow-up visit 71 year old gentleman for evaluation of shortness of breath and chronic cough.  He has CAD/CABG with ischemic cardiomyopathy, chronic renal failure on HD, pretension, diabetes, history of a left VATS drainage of a pleural effusion.  We had planned to perform pulmonary function testing but it does not look like this has been done.  He has albuterol, uses  He has been seen by CHF clinic and his UF at HD has been increased his breathing has improved some with this. He has frequent cough, has been more bothersome over about 6 months. His cough is his biggest concern, non-productive. Denies any GERD sx. Rarely uses flonase, does have clear drainage in the am.   He had a sleep study on 09/01/2022 for CPAP titration.  He was titrated to 15 cmH2O, and recommended 4-15 cmH2O auto titration CPAP treatment.   Review of Systems As per HPI  Past Medical History:  Diagnosis Date   Allergy, unspecified, initial encounter 08/30/2020   Anaphylactic  shock, unspecified, initial encounter 08/30/2020   Anemia in chronic kidney disease 08/30/2020   Arthritis    Back pain 09/07/2017   Carotid artery stenosis    29-51% LICA, 8-84% RICA 11/28/58 [place]   Chest pain 03/24/2019   Chronic infective endocarditis    Chronic kidney disease (CKD), stage IV (severe) ([place]) 12/01/2018   CKD (chronic kidney disease), stage V ([place]) 03/24/2019   Coronary artery disease    Dependence on renal dialysis ([place]) 08/31/2020   Diabetes mellitus without complication (HCC)    Disorder of phosphorus metabolism, unspecified 08/31/2020   DM (diabetes mellitus), type 2 with renal complications ([place]) 63/11/6008   Dyspnea    Encounter for screening for respiratory tuberculosis 08/30/2020   End stage renal disease ([place]) 09/07/2017   ESRD (end stage renal disease) ([place]) 08/27/2020   Essential (primary) hypertension 09/07/2017   Fever    History of endocarditis 01/09/2020   Hypercalcemia 10/08/2020   Hyperlipidemia, unspecified 09/07/2017   Hypertension    Hypokalemia 11/13/2021   Iron deficiency anemia, unspecified 08/30/2020   Low back pain 09/07/2017   MI (myocardial infarction) ([place])    MSSA bacteremia 09/10/2017   Non-ST elevation (NSTEMI) myocardial infarction ([place]) 01/09/2020   NSTEMI (non-ST elevated myocardial infarction) ([place]) 03/24/2019   Other specified arthritis, other site 08/31/2020   Pain, unspecified 08/30/2020   Personal history of other diseases of the circulatory system 08/31/2020   Presence of aortocoronary bypass graft 08/31/2020   Pruritus, unspecified  08/30/2020   Renal disorder    Restless legs syndrome 09/06/2020   S/P CABG x 4 04/03/2019   S/P Video Bronchoscopy, Right VATS with drainage of pleural effusion, decortication, intercostal nerve block 02/20/2020   Secondary hyperparathyroidism of renal origin ([place]) 08/30/2020   Thoracic aortic aneurysm (TAA) (HCC)    4.3 cm ascending TAA 02/02/20 CT   Type 2 diabetes mellitus with diabetic nephropathy ([place])  08/30/2020     Family History  Problem Relation Age of Onset   Cancer Maternal Grandmother      Social History   Socioeconomic History   Marital status: Married    Spouse name: Not on file   Number of children: Not on file   Years of education: Not on file   Highest education level: Not on file  Occupational History   Not on file  Tobacco Use   Smoking status: Former    Packs/day: 2.00    Years: 30.00    Total pack years: 60.00    Types: Cigarettes    Start date: 1968    Quit date: 1998    Years since quitting: 25.7   Smokeless tobacco: Never  Vaping Use   Vaping Use: Never used  Substance and Sexual Activity   Alcohol use: Yes    Comment: Occasionally.   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 No Did it involve sudden or severe rash/hives, skin peeling, or any reaction on the inside of your mouth or nose? Yes Did you need to seek medical attention at a hospital or doctor's office? Yes When did it last happen?      Childhood allergy If all above answers are "NO", may proceed with cephalosporin use.      Outpatient Medications Prior to Visit  Medication Sig Dispense Refill   acetaminophen (TYLENOL) 500 MG tablet Take 1,500 mg by mouth 2 (two) times daily as needed for mild pain or moderate pain (pain).     albuterol (VENTOLIN HFA) 108 (90 Base) MCG/ACT inhaler Inhale 1-2 puffs into the lungs every 6 (six) hours as needed for wheezing or  shortness of breath. 1 each 3   aspirin EC 81 MG tablet Take 81 mg by mouth daily.     atorvastatin (LIPITOR) 40 MG tablet Take 40 mg by mouth daily in the afternoon.      AURYXIA 1 GM 210 MG(Fe) tablet Take 210 mg by mouth 3 (three) times daily.     Dulaglutide 1.5 MG/0.5ML SOPN Inject 1.5 mg into the skin every Sunday.     ferric citrate (AURYXIA) 1 GM 210 MG(Fe) tablet Take 210 mg by mouth 3 (three) times daily with meals.     fluticasone (FLONASE) 50 MCG/ACT nasal spray Place 1 spray into both nostrils daily as needed for allergies or rhinitis.     gabapentin (NEURONTIN) 100 MG capsule Take 2 capsules (200 mg total) by  mouth at bedtime.     glipiZIDE (GLUCOTROL XL) 2.5 MG 24 hr tablet Take 2.5 mg by mouth 2 (two) times daily.     hydrALAZINE (APRESOLINE) 10 MG tablet Take 1 tablet (10 mg total) by mouth 3 (three) times daily. 270 tablet 3   linagliptin (TRADJENTA) 5 MG TABS tablet Take 5 mg by mouth daily.     Methoxy PEG-Epoetin Beta (MIRCERA IJ) Inject 1 Dose as directed See admin instructions. MONTHLY     OVER THE COUNTER MEDICATION Place 1 drop into both eyes 2 (two) times daily as needed (dry eyes). Hyper Tears eye drops/Unknown strength     rOPINIRole (REQUIP) 0.25 MG tablet Take 0.25 mg by mouth at bedtime.     SENSIPAR 30 MG tablet Take 30 mg by mouth daily.     sevelamer (RENAGEL) 800 MG tablet Take 1,600 mg by mouth 3 (three) times daily.     tamsulosin (FLOMAX) 0.4 MG CAPS capsule Take 0.4 mg by mouth daily.     zolpidem (AMBIEN) 10 MG tablet Take 10 mg by mouth at bedtime.     Facility-Administered Medications Prior to Visit  Medication Dose Route Frequency Provider Last Rate Last Admin   regadenoson (LEXISCAN) injection SOLN 0.4 mg  0.4 mg Intravenous Once Revankar, Reita Cliche, MD       technetium tetrofosmin (TC-MYOVIEW) injection 57.8 millicurie  46.9 millicurie Intravenous Once PRN Revankar, Reita Cliche, MD       technetium tetrofosmin (TC-MYOVIEW) injection 62.9 millicurie  52.8  millicurie Intravenous Once PRN Revankar, Reita Cliche, MD            Objective:   Physical Exam Vitals:   09/03/22 1422  BP: 120/66  Pulse: 87  Temp: 98.5 F (36.9 C)  TempSrc: Oral  SpO2: 97%  Weight: 202 lb 14.2 oz (92 kg)  Height: 6\' 2"  (1.88 m)   Gen: Pleasant, well-nourished, in no distress,  normal affect, coughing  ENT: No lesions,  mouth clear,  oropharynx clear, no postnasal drip  Neck: No JVD, no stridor  Lungs: No use of accessory muscles, distant, no crackles or wheezing on normal respiration, no wheeze on forced expiration  Cardiovascular: RRR, heart sounds normal, no murmur or gallops, no peripheral edema, fistula  Musculoskeletal: No deformities, no cyanosis or clubbing  Neuro: alert, awake, non focal  Skin: Warm, no lesions or rash      Assessment & Plan:  Chronic cough Question whether there may be a component of volume overload that contribute to his cough but suspect that this is upper airway in nature.  He does have some intermittent rhinitis but does not use Flonase reliably.  Discussed this with him today.  He may have some silent GERD that is a contributor and we will try to treat this.  Finally question whether he has underlying obstructive lung disease that might be contributing.  We will add Spiriva Respimat  Dyspnea Again multifactorial with cardiac in cardiorenal contributors but suspect that he may have some degree of COPD.  He did not get his PFT today but I think it is okay for [place] to empirically try Spiriva Respimat as above.  We can review in a couple months and see if he has benefit.  If so we will continue it going forward.  He will keep albuterol available to use if needed.   Baltazar Apo, MD, PhD 09/03/2022, 3:06 PM [facility] [phone] or if no answer before 7:00PM call [phone] For any issues  after 7:00PM please call eLink [phone]

## 2022-09-14 ENCOUNTER — Telehealth: Payer: Self-pay | Admitting: Cardiovascular Disease

## 2022-09-14 NOTE — Telephone Encounter (Signed)
Patient is calling wanting an update on receiving his CPAP. He was advised by his CPAP company our office needs to contact them regarding it. Please advise.

## 2022-09-15 ENCOUNTER — Telehealth (HOSPITAL_COMMUNITY): Payer: Self-pay | Admitting: Vascular Surgery

## 2022-09-15 NOTE — Telephone Encounter (Signed)
Lvm giving PYP scan appt 10/31 @ 10 am, asked pt to call back to conform appt

## 2022-09-21 NOTE — Telephone Encounter (Addendum)
The patient has been notified of the result and verbalized understanding.  All questions (if any) were answered. Latrelle Dodrill, CMA 09/21/2022 3:10 PM   Upon patient request DME selection is [facility]. Patient understands he will be contacted by [facility] to set up his cpap. Patient understands to call if [facility] does not contact him with new setup in a timely manner. Patient understands they will be called once confirmation has been received from [facility]/ that they have received their new machine to schedule 10 week follow up appointment.   [facility] notified of new cpap order  Please add to airview Patient was grateful for the call and thanked me.

## 2022-09-22 ENCOUNTER — Encounter (HOSPITAL_COMMUNITY)
Admission: RE | Admit: 2022-09-22 | Discharge: 2022-09-22 | Disposition: A | Discharge status: Home / Self Care | Payer: Medicare HMO | Source: Ambulatory Visit | Attending: Internal Medicine | Admitting: Internal Medicine

## 2022-09-22 ENCOUNTER — Other Ambulatory Visit (HOSPITAL_COMMUNITY): Payer: Medicare HMO

## 2022-09-22 ENCOUNTER — Encounter (HOSPITAL_COMMUNITY): Payer: Medicare HMO

## 2022-09-22 DIAGNOSIS — I255 Ischemic cardiomyopathy: Secondary | ICD-10-CM | POA: Insufficient documentation

## 2022-09-22 MED ORDER — TECHNETIUM TC 99M PYROPHOSPHATE
20.7000 | Freq: Once | INTRAVENOUS | Status: AC | PRN
  Administered 2022-09-22: 20.7 via INTRAVENOUS
  Filled 2022-09-22: qty 21

## 2022-10-07 ENCOUNTER — Telehealth (HOSPITAL_COMMUNITY): Payer: Self-pay | Admitting: Internal Medicine

## 2022-10-07 ENCOUNTER — Telehealth (HOSPITAL_COMMUNITY): Payer: Self-pay | Admitting: *Deleted

## 2022-10-07 NOTE — Telephone Encounter (Signed)
Pt stated he has been calling triage for test results, and no return call. Please advise

## 2022-10-07 NOTE — Telephone Encounter (Signed)
Pt left vm requesting pyp results. Routed to [place]

## 2022-10-08 NOTE — Telephone Encounter (Signed)
Patient called in today to say he has declined his cpap setup for financial reasons. He can not afford to pay the $39 a month. He may call back at a later time if his situation changes.

## 2022-10-12 ENCOUNTER — Telehealth (HOSPITAL_COMMUNITY): Payer: Self-pay

## 2022-10-12 NOTE — Telephone Encounter (Signed)
Patient aware of results.

## 2022-10-12 NOTE — Telephone Encounter (Signed)
Called pt no answer °

## 2022-11-03 ENCOUNTER — Ambulatory Visit: Payer: Medicare HMO | Admitting: Cardiology

## 2022-11-04 ENCOUNTER — Ambulatory Visit (INDEPENDENT_AMBULATORY_CARE_PROVIDER_SITE_OTHER): Payer: Medicare HMO

## 2022-11-04 ENCOUNTER — Encounter: Payer: Self-pay | Admitting: Emergency Medicine

## 2022-11-04 ENCOUNTER — Ambulatory Visit (INDEPENDENT_AMBULATORY_CARE_PROVIDER_SITE_OTHER): Payer: Medicare HMO | Admitting: Emergency Medicine

## 2022-11-04 VITALS — BP 128/68 | HR 91 | Temp 98.0°F | Ht 74.0 in | Wt 204.0 lb

## 2022-11-04 DIAGNOSIS — R053 Chronic cough: Secondary | ICD-10-CM

## 2022-11-04 DIAGNOSIS — R0602 Shortness of breath: Secondary | ICD-10-CM

## 2022-11-04 MED ORDER — HYDROCODONE BIT-HOMATROP MBR 5-1.5 MG/5ML PO SOLN: 5.0000 mL | Freq: Four times a day (QID) | ORAL | 0 refills | Status: DC | PRN

## 2022-11-04 NOTE — Progress Notes (Signed)
Subjective:    Patient ID: John [name], male    DOB: 13-Jan-1951, 71 y.o.   MRN: [id number]  HPI 71 year old man, former smoker (60 pack years) with a history of CAD/CABG, chronic renal failure on HD, hypertension, diabetes, history of MSSA bacteremia and endocarditis, left VATS drainage of a pleural effusion.  He is referred today for cough and shortness of breath. He had a TTE in March that shows decreased LV fxn to 35%.  He has started to have cough beginning about 2 months ago. He has been waked from sleep with cough and SOB. He can also have exertional SOB when he is walking, working. He uses albuterol frequently through the day. He has increased nasal congestion and mucous over the last week. Occasional reflux sx. He has tried scheduled BD before but he isn't sure that it has helped him. Has flonase that he takes qod.   CT-PA 05/16/22 reviewed by me showed no PE, stable TAA, atelectasis and chronic L sided volume loss  ROV 09/03/22 --follow-up visit 71 year old gentleman for evaluation of shortness of breath and chronic cough.  He has CAD/CABG with ischemic cardiomyopathy, chronic renal failure on HD, pretension, diabetes, history of a left VATS drainage of a pleural effusion.  We had planned to perform pulmonary function testing but it does not look like this has been done.  He has albuterol, uses  He has been seen by CHF clinic and his UF at HD has been increased his breathing has improved some with this. He has frequent cough, has been more bothersome over about 6 months. His cough is his biggest concern, non-productive. Denies any GERD sx. Rarely uses flonase, does have clear drainage in the am.   He had a sleep study on 09/01/2022 for CPAP titration.  He was titrated to 15 cmH2O, and recommended 4-15 cmH2O auto titration CPAP treatment.  ROV 11/04/22 --71 year old man with history of chronic cough, shortness of breath.  He has a history of CAD/CABG and ischemic cardiomyopathy,  chronic renal failure on HD, hypertension, diabetes, OSA on CPAP.  He had a left VATS drainage of a pleural effusion.  Volume status has been an issue and efforts have been made to increase his UF.  I saw him in October, started empirically on PPI to see if this would help his cough.  I also started Spiriva Respimat to see if he would get benefit.  Today he reports that he took the PPI, didn't really change his cough. The cough happens mainly when supine at night. He has stopped the Spiriva - didn't help him.    Review of Systems As per HPI  Past Medical History:  Diagnosis Date   Allergy, unspecified, initial encounter 08/30/2020   Anaphylactic shock, unspecified, initial encounter 08/30/2020   Anemia in chronic kidney disease 08/30/2020   Arthritis    Back pain 09/07/2017   Carotid artery stenosis    52-84% LICA, 1-32% RICA 02/25/00 [place]   Chest pain 03/24/2019   Chronic infective endocarditis    Chronic kidney disease (CKD), stage IV (severe) ([place]) 12/01/2018   CKD (chronic kidney disease), stage V ([place]) 03/24/2019   Coronary artery disease    Dependence on renal dialysis ([place]) 08/31/2020   Diabetes mellitus without complication ([place])    Disorder of phosphorus metabolism, unspecified 08/31/2020   DM (diabetes mellitus), type 2 with renal complications ([place]) 02/72/5366   Dyspnea    Encounter for screening for respiratory tuberculosis 08/30/2020   End stage renal disease ([place])  09/07/2017   ESRD (end stage renal disease) ([place]) 08/27/2020   Essential (primary) hypertension 09/07/2017   Fever    History of endocarditis 01/09/2020   Hypercalcemia 10/08/2020   Hyperlipidemia, unspecified 09/07/2017   Hypertension    Hypokalemia 11/13/2021   Iron deficiency anemia, unspecified 08/30/2020   Low back pain 09/07/2017   MI (myocardial infarction) ([place])    MSSA bacteremia 09/10/2017   Non-ST elevation (NSTEMI) myocardial infarction ([place]) 01/09/2020   NSTEMI (non-ST elevated myocardial infarction) ([place])  03/24/2019   Other specified arthritis, other site 08/31/2020   Pain, unspecified 08/30/2020   Personal history of other diseases of the circulatory system 08/31/2020   Presence of aortocoronary bypass graft 08/31/2020   Pruritus, unspecified 08/30/2020   Renal disorder    Restless legs syndrome 09/06/2020   S/P CABG x 4 04/03/2019   S/P Video Bronchoscopy, Right VATS with drainage of pleural effusion, decortication, intercostal nerve block 02/20/2020   Secondary hyperparathyroidism of renal origin ([place]) 08/30/2020   Thoracic aortic aneurysm (TAA) (HCC)    4.3 cm ascending TAA 02/02/20 CT   Type 2 diabetes mellitus with diabetic nephropathy ([place]) 08/30/2020     Family History  Problem Relation Age of Onset   Cancer Maternal Grandmother      Social History   Socioeconomic History   Marital status: Married    Spouse name: Not on file   Number of children: Not on file   Years of education: Not on file   Highest education level: Not on file  Occupational History   Not on file  Tobacco Use   Smoking status: Former    Packs/day: 2.00    Years: 30.00    Total pack years: 60.00    Types: Cigarettes    Start date: 1968    Quit date: 1998    Years since quitting: 25.9   Smokeless tobacco: Never  Vaping Use   Vaping Use: Never used  Substance and Sexual Activity   Alcohol use: Yes    Comment: Occasionally.   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 No Did it  involve sudden or severe rash/hives, skin peeling, or any reaction on the inside of your mouth or nose? Yes Did you need to seek medical attention at a hospital or doctor's office? Yes When did it last happen?      Childhood allergy If all above answers are "NO", may proceed with cephalosporin use.      Outpatient Medications Prior to Visit  Medication Sig Dispense Refill   acetaminophen (TYLENOL) 500 MG tablet Take 1,500 mg by mouth 2 (two) times daily as needed for mild pain or moderate pain (pain).     albuterol (VENTOLIN HFA) 108 (90 Base) MCG/ACT inhaler Inhale 1-2 puffs into the lungs every 6 (six) hours as needed for wheezing or shortness of breath. 1 each 3   aspirin EC 81 MG  tablet Take 81 mg by mouth daily.     atorvastatin (LIPITOR) 40 MG tablet Take 40 mg by mouth daily in the afternoon.      AURYXIA 1 GM 210 MG(Fe) tablet Take 210 mg by mouth 3 (three) times daily.     Dulaglutide 1.5 MG/0.5ML SOPN Inject 1.5 mg into the skin every Sunday.     ferric citrate (AURYXIA) 1 GM 210 MG(Fe) tablet Take 210 mg by mouth 3 (three) times daily with meals.     fluticasone (FLONASE) 50 MCG/ACT nasal spray Place 1 spray into both nostrils daily as needed for allergies or rhinitis.     gabapentin (NEURONTIN) 100 MG capsule Take 2 capsules (200 mg total) by mouth at bedtime.     glipiZIDE (GLUCOTROL XL) 2.5 MG 24 hr tablet Take 2.5 mg by mouth 2 (two) times daily.     hydrALAZINE (APRESOLINE) 10 MG tablet Take 1 tablet (10 mg total) by mouth 3 (three) times daily. 270 tablet 3   linagliptin (TRADJENTA) 5 MG TABS tablet Take 5 mg by mouth daily.     Methoxy PEG-Epoetin Beta (MIRCERA IJ) Inject 1 Dose as directed See admin instructions. MONTHLY     OVER THE COUNTER MEDICATION Place 1 drop into both eyes 2 (two) times daily as needed (dry eyes). Hyper Tears eye drops/Unknown strength     pantoprazole (PROTONIX) 40 MG tablet Take 1 tablet (40 mg total) by mouth daily. 30 tablet 5   rOPINIRole  (REQUIP) 0.25 MG tablet Take 0.25 mg by mouth at bedtime.     SENSIPAR 30 MG tablet Take 30 mg by mouth daily.     sevelamer (RENAGEL) 800 MG tablet Take 1,600 mg by mouth 3 (three) times daily.     tamsulosin (FLOMAX) 0.4 MG CAPS capsule Take 0.4 mg by mouth daily.     zolpidem (AMBIEN) 10 MG tablet Take 10 mg by mouth at bedtime.     Tiotropium Bromide Monohydrate (SPIRIVA RESPIMAT) 1.25 MCG/ACT AERS Inhale 2 puffs into the lungs daily. (Patient not taking: Reported on 11/04/2022) 4 g 0   Facility-Administered Medications Prior to Visit  Medication Dose Route Frequency Provider Last Rate Last Admin   regadenoson (LEXISCAN) injection SOLN 0.4 mg  0.4 mg Intravenous Once Revankar, Reita Cliche, MD       technetium tetrofosmin (TC-MYOVIEW) injection 01.7 millicurie  49.4 millicurie Intravenous Once PRN Revankar, Reita Cliche, MD       technetium tetrofosmin (TC-MYOVIEW) injection 49.6 millicurie  75.9 millicurie Intravenous Once PRN Revankar, Reita Cliche, MD            Objective:   Physical Exam Vitals:   11/04/22 0915  BP: 128/68  Pulse: 91  Temp: 98 F (36.7 C)  TempSrc: Oral  SpO2: 96%  Weight: 204 lb (92.5 kg)  Height: 6\' 2"  (1.88 m)   Gen: Pleasant, well-nourished, in no distress,  normal affect, coughing  ENT: No lesions,  mouth clear,  oropharynx clear, no postnasal drip  Neck: No JVD, no stridor  Lungs: No use of accessory muscles, distant, no crackles or wheezing on normal respiration, no wheeze on forced expiration  Cardiovascular: RRR, heart sounds normal, no murmur or gallops, no peripheral edema, fistula  Musculoskeletal: No deformities, no cyanosis or clubbing  Neuro: alert, awake, non focal  Skin: Warm, no lesions or rash      Assessment & Plan:  Chronic cough Little change.  Most bothersome at night when he first lays down.  Does not seem  to wake him up.  It can happen during the day as well.  Minimally productive.  Did not improve with the PPI.  Did not change with  Spiriva.  He denies any significant congestion or drainage.  Consider that this is due to his underlying parenchymal/pleural disease.  I think it would be reasonable to try cough suppression with Hycodan.  He is willing to do so.  I did caution him regarding the effects of codeine.  Dyspnea Multifactorial.  Unclear how much obstructive disease he has as there have been no PFT but he did not respond to empiric trial of bronchodilator.  He has albuterol available to use if needed.  I suspect that he has cardiac disease, renal disease and volume status are the main contributors here.  I will check a chest x-ray today to ensure no reaccumulation of pleural fluid.   Baltazar Apo, MD, PhD 11/04/2022, 9:36 AM [facility] [phone] or if no answer before 7:00PM call [phone] For any issues after 7:00PM please call eLink [phone]

## 2022-11-04 NOTE — Patient Instructions (Signed)
We will perform a chest x-ray today Agree with stopping Spiriva for now. Keep albuterol available to use 2 puffs if needed for shortness of breath, chest tightness, spells of coughing Okay to stop pantoprazole (Protonix) We will try using Hycodan cough syrup.  Take 5 cc about 1 hour before bedtime for cough suppression.  Please be mindful that this medication can make you sleepy.  Take carefully if you are using your Ambien Follow with Dr Lamonte Sakai in 6 months or sooner if you have any problems

## 2022-11-04 NOTE — Assessment & Plan Note (Signed)
Multifactorial.  Unclear how much obstructive disease he has as there have been no PFT but he did not respond to empiric trial of bronchodilator.  He has albuterol available to use if needed.  I suspect that he has cardiac disease, renal disease and volume status are the main contributors here.  I will check a chest x-ray today to ensure no reaccumulation of pleural fluid.

## 2022-11-04 NOTE — Assessment & Plan Note (Signed)
Little change.  Most bothersome at night when he first lays down.  Does not seem to wake him up.  It can happen during the day as well.  Minimally productive.  Did not improve with the PPI.  Did not change with Spiriva.  He denies any significant congestion or drainage.  Consider that this is due to his underlying parenchymal/pleural disease.  I think it would be reasonable to try cough suppression with Hycodan.  He is willing to do so.  I did caution him regarding the effects of codeine.

## 2022-11-06 ENCOUNTER — Other Ambulatory Visit: Payer: Self-pay | Admitting: Emergency Medicine

## 2022-12-03 ENCOUNTER — Encounter (HOSPITAL_COMMUNITY): Payer: Self-pay | Admitting: Internal Medicine

## 2022-12-03 ENCOUNTER — Inpatient Hospital Stay (HOSPITAL_COMMUNITY)
Admission: RE | Admit: 2022-12-03 | Discharge: 2022-12-03 | Disposition: A | Discharge status: Home / Self Care | Payer: Medicare HMO | Source: Ambulatory Visit | Attending: Internal Medicine | Admitting: Internal Medicine

## 2022-12-03 ENCOUNTER — Other Ambulatory Visit (HOSPITAL_COMMUNITY): Payer: Self-pay | Admitting: Internal Medicine

## 2022-12-03 ENCOUNTER — Other Ambulatory Visit: Payer: Self-pay

## 2022-12-03 ENCOUNTER — Ambulatory Visit
Admission: RE | Admit: 2022-12-03 | Discharge: 2022-12-03 | Disposition: A | Discharge status: Home / Self Care | Payer: Medicare HMO | Source: Ambulatory Visit | Attending: Internal Medicine | Admitting: Internal Medicine

## 2022-12-03 VITALS — BP 90/50 | HR 84 | Wt 207.2 lb

## 2022-12-03 DIAGNOSIS — E1122 Type 2 diabetes mellitus with diabetic chronic kidney disease: Secondary | ICD-10-CM | POA: Insufficient documentation

## 2022-12-03 DIAGNOSIS — R053 Chronic cough: Secondary | ICD-10-CM | POA: Diagnosis not present

## 2022-12-03 DIAGNOSIS — Z955 Presence of coronary angioplasty implant and graft: Secondary | ICD-10-CM | POA: Diagnosis not present

## 2022-12-03 DIAGNOSIS — I493 Ventricular premature depolarization: Secondary | ICD-10-CM

## 2022-12-03 DIAGNOSIS — Z992 Dependence on renal dialysis: Secondary | ICD-10-CM | POA: Diagnosis not present

## 2022-12-03 DIAGNOSIS — E1151 Type 2 diabetes mellitus with diabetic peripheral angiopathy without gangrene: Secondary | ICD-10-CM | POA: Diagnosis not present

## 2022-12-03 DIAGNOSIS — I2510 Atherosclerotic heart disease of native coronary artery without angina pectoris: Secondary | ICD-10-CM | POA: Diagnosis not present

## 2022-12-03 DIAGNOSIS — Z7984 Long term (current) use of oral hypoglycemic drugs: Secondary | ICD-10-CM | POA: Diagnosis not present

## 2022-12-03 DIAGNOSIS — I5022 Chronic systolic (congestive) heart failure: Secondary | ICD-10-CM | POA: Insufficient documentation

## 2022-12-03 DIAGNOSIS — N186 End stage renal disease: Secondary | ICD-10-CM | POA: Insufficient documentation

## 2022-12-03 DIAGNOSIS — G4733 Obstructive sleep apnea (adult) (pediatric): Secondary | ICD-10-CM

## 2022-12-03 DIAGNOSIS — Z87891 Personal history of nicotine dependence: Secondary | ICD-10-CM | POA: Diagnosis not present

## 2022-12-03 DIAGNOSIS — I255 Ischemic cardiomyopathy: Secondary | ICD-10-CM | POA: Insufficient documentation

## 2022-12-03 DIAGNOSIS — Z7985 Long-term (current) use of injectable non-insulin antidiabetic drugs: Secondary | ICD-10-CM | POA: Insufficient documentation

## 2022-12-03 DIAGNOSIS — I132 Hypertensive heart and chronic kidney disease with heart failure and with stage 5 chronic kidney disease, or end stage renal disease: Secondary | ICD-10-CM | POA: Diagnosis not present

## 2022-12-03 DIAGNOSIS — Z951 Presence of aortocoronary bypass graft: Secondary | ICD-10-CM | POA: Diagnosis not present

## 2022-12-03 MED ORDER — MIDODRINE HCL 10 MG PO TABS: 10.0000 mg | ORAL_TABLET | ORAL | 3 refills | Status: DC

## 2022-12-03 NOTE — Patient Instructions (Addendum)
INCREASE Midodrine to 10 mg every Monday, Wednesday and Friday before Dialysis.   Your physician has requested that you have an echocardiogram. Echocardiography is a painless test that uses sound waves to create images of your heart. It provides your doctor with information about the size and shape of your heart and how well your heart's chambers and valves are working. This procedure takes approximately one hour. There are no restrictions for this procedure. Please do NOT wear cologne, perfume, aftershave, or lotions (deodorant is allowed). Please arrive 15 minutes prior to your appointment time.  Your provider has recommended that  you wear a Zio Patch for 14 days.  This monitor will record your heart rhythm for our review.  IF you have any symptoms while wearing the monitor please press the button.  If you have any issues with the patch or you notice a red or orange light on it please call the company at [phone].  Once you remove the patch please mail it back to the company as soon as possible so we can get the results.   Your physician recommends that you schedule a follow-up appointment in: 1 month  If you have any questions or concerns before your next appointment please send [place] a message through [place] or call our office at [phone].    TO LEAVE A MESSAGE FOR THE NURSE SELECT OPTION 2, PLEASE LEAVE A MESSAGE INCLUDING: YOUR NAME DATE OF BIRTH CALL BACK NUMBER REASON FOR CALL**this is important as we prioritize the call backs  YOU WILL RECEIVE A CALL BACK THE SAME DAY AS LONG AS YOU CALL BEFORE 4:00 PM  At the [facility], you and your health needs are our priority. As part of our continuing mission to provide you with exceptional heart care, we have created designated Provider Care Teams. These Care Teams include your primary Cardiologist (physician) and Advanced Practice Providers (APPs- Physician Assistants and Nurse Practitioners) who all work together to  provide you with the care you need, when you need it.   You may see any of the following providers on your designated Care Team at your next follow up: Dr Glori Bickers Dr Loralie Champagne Dr. Roxana Hires, NP Lyda Jester, [place] [facility] [place], [place] Forestine Na, NP Audry Riles, PharmD   Please be sure to bring in all your medications bottles to every appointment.

## 2022-12-03 NOTE — Progress Notes (Signed)
ADVANCED HF CLINIC CONSULT NOTE  Referring Physician: Jenne Campus, MD  Primary Care: Rhea Bleacher, NP Primary Cardiologist: Jenne Campus, MD   HPI: John [name] is a 72 y.o. male history of coronary artery disease with prior PCI to the RCA, DMII, HTN, PAD s/p aortofemoral bypass and ESRD on HD,     Presented 48' w/ CP and elevated Hstop: Cath revealed severe multivessel disease. Underwent a four-vessel CABG 5/20 with LIMA to LAD, SVG to diagonal, SVG to OM, and SVG to PDA.  Had endoscopic harvesting of the greater saphenous vein from his right leg. Postoperatively he had thrombocytopenia on Lovenox and this was dc'd, also found to have postop CHF and was diuresed aggressively. CXR showed some loculated large fluid in the left pleura, also echo being done under some suspicion for endocarditis of the aortic valve. TEE no active endocarditis. He did have some calcification of the leaflet of the aortic valve.  Found to have chronic pleural effusion 3/21. Had video bronchoscopy done as well as left VATS with drainage of empyema as well as decortication and intercostal nerve block.    Had chronic renal failure progressively worse until October 21' when he started HD.  10/22 had echo d/t DOE. Had worsening of CAD, his EF was 35 to 40%.  Stress test has been done to rule out ischemia as potential cause of his worsening EF, however did not show any significant ischemia. GDMT started but difficulty managing 2/2 hypotension. Recent echo 9/23 EF down to 20 to 25%. mild LVH. RV size is normal. Severely elevated pulmonary artery systolic pressure. LA mildly elevated, RA mod dilated. Mild MR, Mod TR. Referred to AHF clinic for further management with worsening HFrEF.   PYP 11/23 negative   I saw him for the first time in 9/23. At that time was markedly volume overloaded. I d/w Dr. [name] and the plan was to pull more fluid with HD and decrease his dry weight. They were able to get him down  from 208 to 195 but says his weight keeps going back up on weekends due to high fluid intake. Weight 207 pounds today. Remains SOB with any exertion. + orthopnea or PND. At HD today had to stop  due to low BP and syncope. Takes midodrine 2.5 every am before HD.   Could not afford CPAP   REDs today 41% (down from 50%)  Cardiac studies: Echo 9/23 EF down to 20 to 25%. mild LVH. RV size is normal. Severely elevated pulmonary artery systolic pressure. LA mildly elevated, RA mod dilated. Mild MR, Mod TR.  Echo 3/23 EF 30-35% Echo 10/22 EF 35-40% Lexiscan 2/22 normal Echo 8/21, TEE 2/21, Echo 9/21 EF 40-45% Echo 5/20 EF 50-55% LHC 2020: severe multivessel disease Echo 10/18 EF 55-60%  LHC 2020: Mid Cx lesion is 90% stenosed., Ost Cx to Prox Cx lesion is 80% stenosed. Prox LAD to Mid LAD lesion is 75% stenosed. This lesion is hemodynamically significant and had a markedly positive DFR 0.71. Ost 1st Diag lesion is 70% stenosed.Prox RCA to Mid RCA lesion is 75% stenosed.Dist RCA-1 lesion is 40% stenosed. Prior stent with mild instent restenosis.Dist RCA-2 lesion is 80% stenosed.LV end diastolic pressure is normal.There is no aortic valve stenosis.   Past Medical History:  Diagnosis Date   Allergy, unspecified, initial encounter 08/30/2020   Anaphylactic shock, unspecified, initial encounter 08/30/2020   Anemia in chronic kidney disease 08/30/2020   Arthritis    Back pain 09/07/2017  Carotid artery stenosis    16-01% LICA, 0-93% RICA 12/26/53 [place]   Chest pain 03/24/2019   Chronic infective endocarditis    Chronic kidney disease (CKD), stage IV (severe) ([place]) 12/01/2018   CKD (chronic kidney disease), stage V ([place]) 03/24/2019   Coronary artery disease    Dependence on renal dialysis ([place]) 08/31/2020   Diabetes mellitus without complication (HCC)    Disorder of phosphorus metabolism, unspecified 08/31/2020   DM (diabetes mellitus), type 2 with renal complications ([place]) 73/22/0254   Dyspnea    Encounter  for screening for respiratory tuberculosis 08/30/2020   End stage renal disease ([place]) 09/07/2017   ESRD (end stage renal disease) ([place]) 08/27/2020   Essential (primary) hypertension 09/07/2017   Fever    History of endocarditis 01/09/2020   Hypercalcemia 10/08/2020   Hyperlipidemia, unspecified 09/07/2017   Hypertension    Hypokalemia 11/13/2021   Iron deficiency anemia, unspecified 08/30/2020   Low back pain 09/07/2017   MI (myocardial infarction) ([place])    MSSA bacteremia 09/10/2017   Non-ST elevation (NSTEMI) myocardial infarction ([place]) 01/09/2020   NSTEMI (non-ST elevated myocardial infarction) ([place]) 03/24/2019   Other specified arthritis, other site 08/31/2020   Pain, unspecified 08/30/2020   Personal history of other diseases of the circulatory system 08/31/2020   Presence of aortocoronary bypass graft 08/31/2020   Pruritus, unspecified 08/30/2020   Renal disorder    Restless legs syndrome 09/06/2020   S/P CABG x 4 04/03/2019   S/P Video Bronchoscopy, Right VATS with drainage of pleural effusion, decortication, intercostal nerve block 02/20/2020   Secondary hyperparathyroidism of renal origin ([place]) 08/30/2020   Thoracic aortic aneurysm (TAA) (HCC)    4.3 cm ascending TAA 02/02/20 CT   Type 2 diabetes mellitus with diabetic nephropathy ([place]) 08/30/2020    Current Outpatient Medications  Medication Sig Dispense Refill   acetaminophen (TYLENOL) 500 MG tablet Take 1,500 mg by mouth 2 (two) times daily as needed for mild pain or moderate pain (pain).     aspirin EC 81 MG tablet Take 81 mg by mouth daily.     atorvastatin (LIPITOR) 40 MG tablet Take 40 mg by mouth daily in the afternoon.      AURYXIA 1 GM 210 MG(Fe) tablet Take 210 mg by mouth 3 (three) times daily.     Dulaglutide 1.5 MG/0.5ML SOPN Inject 1.5 mg into the skin every Sunday.     fluticasone (FLONASE) 50 MCG/ACT nasal spray Place 1 spray into both nostrils daily as needed for allergies or rhinitis.     gabapentin (NEURONTIN) 100  MG capsule Take 2 capsules (200 mg total) by mouth at bedtime.     glipiZIDE (GLUCOTROL XL) 2.5 MG 24 hr tablet Take 2.5 mg by mouth 2 (two) times daily.     linagliptin (TRADJENTA) 5 MG TABS tablet Take 5 mg by mouth daily.     Methoxy PEG-Epoetin Beta (MIRCERA IJ) Inject 1 Dose as directed See admin instructions. MONTHLY     midodrine (PROAMATINE) 2.5 MG tablet Take 2.5 mg by mouth 3 (three) times a week.     OVER THE COUNTER MEDICATION Place 1 drop into both eyes 2 (two) times daily as needed (dry eyes). Hyper Tears eye drops/Unknown strength     pantoprazole (PROTONIX) 40 MG tablet Take 1 tablet (40 mg total) by mouth daily. 30 tablet 5   rOPINIRole (REQUIP) 0.25 MG tablet Take 0.25 mg by mouth at bedtime.     SENSIPAR 30 MG tablet Take 30 mg by mouth daily.  tamsulosin (FLOMAX) 0.4 MG CAPS capsule Take 0.4 mg by mouth daily.     zolpidem (AMBIEN) 10 MG tablet Take 10 mg by mouth at bedtime.     Current Facility-Administered Medications  Medication Dose Route Frequency Provider Last Rate Last Admin   regadenoson (LEXISCAN) injection SOLN 0.4 mg  0.4 mg Intravenous Once Revankar, Reita Cliche, MD       technetium tetrofosmin (TC-MYOVIEW) injection 01.7 millicurie  49.4 millicurie Intravenous Once PRN Revankar, Reita Cliche, MD       technetium tetrofosmin (TC-MYOVIEW) injection 49.6 millicurie  75.9 millicurie Intravenous Once PRN Revankar, Reita Cliche, MD        Allergies  Allergen Reactions   Penicillins Rash and Other (See Comments)    Did it involve swelling of the face/tongue/throat, SOB, or low BP? No Did it involve sudden or severe rash/hives, skin peeling, or any reaction on the inside of your mouth or nose? Yes Did you need to seek medical attention at a hospital or doctor's office? Yes When did it last happen?      Childhood allergy If all above answers are "NO", may proceed with cephalosporin use.       Social History   Socioeconomic History   Marital status: Married    Spouse  name: Not on file   Number of children: Not on file   Years of education: Not on file   Highest education level: Not on file  Occupational History   Not on file  Tobacco Use   Smoking status: Former    Packs/day: 2.00    Years: 30.00    Total pack years: 60.00    Types: Cigarettes    Start date: 67    Quit date: 1998    Years since quitting: 26.0   Smokeless tobacco: Never  Vaping Use   Vaping Use: Never used  Substance and Sexual Activity   Alcohol use: Yes    Comment: Occasionally.   Drug use: Never   Sexual activity: Not on file  Other Topics Concern   Not on file  Social History Narrative   Not on file   Social Determinants of Health   Financial Resource Strain: Not on file  Food Insecurity: Not on file  Transportation Needs: Not on file  Physical Activity: Not on file  Stress: Not on file  Social Connections: Not on file  Intimate Partner Violence: Not on file      Family History  Problem Relation Age of Onset   Cancer Maternal Grandmother     Vitals:   12/03/22 1459  BP: (!) 90/50  Pulse: 84  SpO2: 95%  Weight: 94 kg (207 lb 3.2 oz)   Wt Readings from Last 3 Encounters:  12/03/22 94 kg (207 lb 3.2 oz)  11/04/22 92.5 kg (204 lb)  09/03/22 92 kg (202 lb 14.2 oz)      PHYSICAL EXAM: General:  Elderly No resp difficulty HEENT: normal Neck: supple. JVP 8-9 with prominent v waves  Carotids 2+ bilat; no bruits. No lymphadenopathy or thryomegaly appreciated. Cor: PMI nondisplaced. Regular rate & rhythm. No rubs, gallops or murmurs. Lungs: clear Abdomen: soft, nontender, + distended. No hepatosplenomegaly. No bruits or masses. Good bowel sounds. Extremities: no cyanosis, clubbing, rash, 1-2+ edema R>L  Neuro: alert & orientedx3, cranial nerves grossly intact. moves all 4 extremities w/o difficulty. Affect pleasant   ASSESSMENT & PLAN: Chronic systolic heart failure - Echo 10/18 EF 55-60% - LHC 2020: severe multivessel disease, underwent CABG  x4 -  Echo 8/21, TEE 2/21, Echo 9/21 EF 40-45% - Lexiscan 2/22 normal - Echo 10/22 EF 35-40% - EF 3/23 30-35% - Echo 9/23 EF down to 20 to 25%. mild LVH. RV size is normal. Severely elevated pulmonary artery systolic pressure. LA mildly elevated, RA mod dilated. Mild MR, Mod TR.  - NYHA IIIB, recently under a lot of stress.  - Unable to start GDMT w/ ESRD requiring midodrine for BP support - PYP negative - Volume status still elevated but improving.  - Will increased pre-HD midodrine to 10mg . I spoke with Dr. [name]. They will continue to work on dropping his dry weight - I will see back in several weeks. If still struggling with volume overload and advanced HF symptoms can consider R/L cath - Not candidate for VAD/transplant with age and ESRD. Can consider Barostim if we can stabilize him a bit first,  CAD, ICM s/p CABG x4 - Previous smoker, quit in 71' - LHC 2020: severe multivessel disease, s/p CABG x4 - Continue ASA and statin - No current s/s angina. Plan as above.  ESRD on HD - TThuS schedule - Plan as above HTN - resolved. Has been getting hypotensive with HD DM II - Managed by PCP - Hgb A1c last 6.3  6. SOB/Persistent cough - Follows w/ Dr. Lamonte Sakai - Suspect 2/2 volume overload   Glori Bickers, MD  3:15 PM

## 2022-12-03 NOTE — Progress Notes (Signed)
ReDS Vest / Clip - 12/03/22 1500       ReDS Vest / Clip   Station Marker D    Ruler Value 32    ReDS Value Range High volume overload    ReDS Actual Value 41

## 2022-12-04 ENCOUNTER — Encounter: Payer: Self-pay | Admitting: Cardiology

## 2022-12-04 ENCOUNTER — Ambulatory Visit (INDEPENDENT_AMBULATORY_CARE_PROVIDER_SITE_OTHER): Payer: Medicare HMO | Admitting: Cardiology

## 2022-12-04 VITALS — BP 120/60 | HR 82 | Ht 74.0 in | Wt 207.0 lb

## 2022-12-04 DIAGNOSIS — N186 End stage renal disease: Secondary | ICD-10-CM | POA: Diagnosis not present

## 2022-12-04 DIAGNOSIS — I7121 Aneurysm of the ascending aorta, without rupture: Secondary | ICD-10-CM

## 2022-12-04 DIAGNOSIS — I10 Essential (primary) hypertension: Secondary | ICD-10-CM | POA: Diagnosis not present

## 2022-12-04 DIAGNOSIS — I255 Ischemic cardiomyopathy: Secondary | ICD-10-CM | POA: Diagnosis not present

## 2022-12-04 NOTE — Progress Notes (Signed)
Cardiology Office Note:    Date:  12/04/2022   ID:  JOZIYAH ROBLERO, DOB 09/01/1951, MRN [id number]  PCP:  Rhea Bleacher, NP  Cardiologist:  Jenne Campus, MD    Referring MD: Rhea Bleacher, NP   Chief Complaint  Patient presents with   Follow-up    History of Present Illness:    John [name] is a 72 y.o. male    history of coronary artery disease with prior PCI to the RCA, type 2 diabetes, hypertension, PAD status post aortofemoral bypass, end-stage IV chronic kidney disease.  He was found to have elevated troponin, and therefore cardiac catheterization was completed on 03/27/2019.   Cardiac cath revealed severe multivessel disease to include 90% mid RCA lesion, ostial circumflex to proximal circumflex lesion 80% stenosed, proximal LAD to mid LAD 75% stenosed with markedly positive DFR 0.71, first diagonal was 70% stenosed, proximal RCA to mid RCA was 35% stenosed, distal RCA 1 lesion 40% stenosis was prior stent revealing mild in-stent restenosis, distal RCA to lesion 80% stenosed.  The patient was referred to CVTS for surgical consultation for CABG.   The patient underwent a four-vessel CABG on 03/29/2019 with LIMA to LAD, SVG to diagonal, SVG to OM, and SVG to PDA.  He had endoscopic harvesting of the greater saphenous vein from his right leg.  Postoperatively he had thrombocytopenia on Lovenox and this was discontinued, he was also found to have postoperative CHF and was diuresed aggressively.  He was followed by nephrology throughout hospital course.  He was discharged on 04/03/2019   As a part of evaluation he had a chest x-ray done which showed some loculated large fluid in the left pleura, also echocardiogram being done under some suspicion for endocarditis of the aortic valve. TEE done did not confirm presence of active endocarditis.  He did have some calcification of the leaflet of the aortic valve. He was also find to have chronic pleural effusion on the 02/19/2020 he  did have video bronchoscopy done as well as left VATS with drainage of empyema as well as decortication and intercostal nerve block.   He is chronic renal failure progressively got worse until October when he started hemodialysis. He did have AV fistula placed many months ago and that fistula was ready to be used. In October 2022 he had echocardiogram done because of dyspnea on exertion he was identified to have worsening of coronary artery disease his ejection fraction went to 35 to 40%.  Stress test has been done to rule out ischemia as potential cause of his worsening of ejection fraction, however did not show any significant ischemia I am gradually try to put him on guideline directed medical therapy however to allow multiple difficulties because problem blood pressure being low.  He is already on dialysis.  He had repeated echocardiogram done in the spring of this year and there is no improvement left ventricle ejection fraction. Comes today 2 months for follow-up.  Overall not feeling well he said since the time of his accident which was couple months ago he is not feeling well also entering the dialysis getting dizzy to the point of passing out.  He is wearing a Zio patch.  Plan is also to repeat his echocardiogram.  Yesterday he saw advanced congestive heart failure team.  Past Medical History:  Diagnosis Date   Allergy, unspecified, initial encounter 08/30/2020   Anaphylactic shock, unspecified, initial encounter 08/30/2020   Anemia in chronic kidney disease 08/30/2020   Arthritis  Back pain 09/07/2017   Carotid artery stenosis    26-83% LICA, 4-19% RICA 04/24/21 [place]   Chest pain 03/24/2019   Chronic infective endocarditis    Chronic kidney disease (CKD), stage IV (severe) ([place]) 12/01/2018   CKD (chronic kidney disease), stage V ([place]) 03/24/2019   Coronary artery disease    Dependence on renal dialysis ([place]) 08/31/2020   Diabetes mellitus without complication (HCC)    Disorder of phosphorus  metabolism, unspecified 08/31/2020   DM (diabetes mellitus), type 2 with renal complications ([place]) 97/98/9211   Dyspnea    Encounter for screening for respiratory tuberculosis 08/30/2020   End stage renal disease ([place]) 09/07/2017   ESRD (end stage renal disease) ([place]) 08/27/2020   Essential (primary) hypertension 09/07/2017   Fever    History of endocarditis 01/09/2020   Hypercalcemia 10/08/2020   Hyperlipidemia, unspecified 09/07/2017   Hypertension    Hypokalemia 11/13/2021   Iron deficiency anemia, unspecified 08/30/2020   Low back pain 09/07/2017   MI (myocardial infarction) ([place])    MSSA bacteremia 09/10/2017   Non-ST elevation (NSTEMI) myocardial infarction ([place]) 01/09/2020   NSTEMI (non-ST elevated myocardial infarction) ([place]) 03/24/2019   Other specified arthritis, other site 08/31/2020   Pain, unspecified 08/30/2020   Personal history of other diseases of the circulatory system 08/31/2020   Presence of aortocoronary bypass graft 08/31/2020   Pruritus, unspecified 08/30/2020   Renal disorder    Restless legs syndrome 09/06/2020   S/P CABG x 4 04/03/2019   S/P Video Bronchoscopy, Right VATS with drainage of pleural effusion, decortication, intercostal nerve block 02/20/2020   Secondary hyperparathyroidism of renal origin ([place]) 08/30/2020   Thoracic aortic aneurysm (TAA) (HCC)    4.3 cm ascending TAA 02/02/20 CT   Type 2 diabetes mellitus with diabetic nephropathy ([place]) 08/30/2020    Past Surgical History:  Procedure Laterality Date   ABDOMINAL AORTIC ANEURYSM REPAIR     APPENDECTOMY     AV FISTULA PLACEMENT Right 10/18/2018   Procedure: BRACHIO-CEPHALIC ARTERIOVENOUS (AV) FISTULA CREATION;  Surgeon: Angelia Mould, MD;  Location: [facility];  Service: Vascular;  Laterality: Right;   CARDIAC CATHETERIZATION     CORONARY ANGIOPLASTY     CORONARY ARTERY BYPASS GRAFT N/A 03/29/2019   Procedure: CORONARY ARTERY BYPASS GRAFTING (CABG) x 4, using left internal mammary artery and right leg  greater saphenous vein harvested endoscopically;  Surgeon: Melrose Nakayama, MD;  Location: [facility];  Service: Open Heart Surgery;  Laterality: N/A;   CORONARY PRESSURE WIRE/FFR WITH 3D MAPPING N/A 03/27/2019   Procedure: Coronary Pressure Wire/FFR w/3D Mapping;  Surgeon: Jettie Booze, MD;  Location: [facility] [facility];  Service: Cardiovascular;  Laterality: N/A;   DECORTICATION Left 02/19/2020   Procedure: DECORTICATION;  Surgeon: Melrose Nakayama, MD;  Location: [facility];  Service: Thoracic;  Laterality: Left;   INTERCOSTAL NERVE BLOCK Left 02/19/2020   Procedure: Intercostal Nerve Block;  Surgeon: Melrose Nakayama, MD;  Location: [facility] [place];  Service: Thoracic;  Laterality: Left;   IR FLUORO GUIDE CV LINE RIGHT  09/13/2017   IR REMOVAL TUN CV CATH W/O FL  10/22/2017   IR US GUIDE VASC ACCESS RIGHT  09/13/2017   LEFT HEART CATH AND CORONARY ANGIOGRAPHY N/A 03/27/2019   Procedure: LEFT HEART CATH AND CORONARY ANGIOGRAPHY;  Surgeon: Jettie Booze, MD;  Location: [facility];  Service: Cardiovascular;  Laterality: N/A;   NECK SURGERY     PLEURAL EFFUSION DRAINAGE Left 02/19/2020   Procedure: DRAINAGE OF PLEURAL  EFFUSION;  Surgeon: Melrose Nakayama, MD;  Location: [facility];  Service: Thoracic;  Laterality: Left;   SMALL INTESTINE SURGERY     TEE WITHOUT CARDIOVERSION N/A 09/10/2017   Procedure: TRANSESOPHAGEAL ECHOCARDIOGRAM (TEE);  Surgeon: Skeet Latch, MD;  Location: [facility];  Service: Cardiovascular;  Laterality: N/A;   TEE WITHOUT CARDIOVERSION N/A 03/29/2019   Procedure: TRANSESOPHAGEAL ECHOCARDIOGRAM (TEE);  Surgeon: Melrose Nakayama, MD;  Location: [facility];  Service: Open Heart Surgery;  Laterality: N/A;   TEE WITHOUT CARDIOVERSION N/A 01/12/2020   Procedure: TRANSESOPHAGEAL ECHOCARDIOGRAM (TEE);  Surgeon: Sanda Klein, MD;  Location: [facility];  Service: Cardiovascular;  Laterality: N/A;   VIDEO ASSISTED THORACOSCOPY Left 02/19/2020   Procedure:  VIDEO ASSISTED THORACOSCOPY;  Surgeon: Melrose Nakayama, MD;  Location: [facility];  Service: Thoracic;  Laterality: Left;   VIDEO BRONCHOSCOPY N/A 02/19/2020   Procedure: VIDEO BRONCHOSCOPY;  Surgeon: Melrose Nakayama, MD;  Location: [facility];  Service: Thoracic;  Laterality: N/A;    Current Medications: Current Meds  Medication Sig   acetaminophen (TYLENOL) 500 MG tablet Take 1,500 mg by mouth 2 (two) times daily as needed for mild pain or moderate pain (pain).   aspirin EC 81 MG tablet Take 81 mg by mouth daily.   atorvastatin (LIPITOR) 40 MG tablet Take 40 mg by mouth daily in the afternoon.    AURYXIA 1 GM 210 MG(Fe) tablet Take 210 mg by mouth 3 (three) times daily.   Dulaglutide 1.5 MG/0.5ML SOPN Inject 1.5 mg into the skin every Sunday.   fluticasone (FLONASE) 50 MCG/ACT nasal spray Place 1 spray into both nostrils daily as needed for allergies or rhinitis.   gabapentin (NEURONTIN) 100 MG capsule Take 2 capsules (200 mg total) by mouth at bedtime.   glipiZIDE (GLUCOTROL XL) 2.5 MG 24 hr tablet Take 2.5 mg by mouth 2 (two) times daily.   linagliptin (TRADJENTA) 5 MG TABS tablet Take 5 mg by mouth daily.   Methoxy PEG-Epoetin Beta (MIRCERA IJ) Inject 1 Dose as directed See admin instructions. MONTHLY   midodrine (PROAMATINE) 10 MG tablet Take 1 tablet (10 mg total) by mouth 3 (three) times a week. Before Dialysis every Mon, Wed, Fri   OVER THE COUNTER MEDICATION Place 1 drop into both eyes 2 (two) times daily as needed (dry eyes). Hyper Tears eye drops/Unknown strength   pantoprazole (PROTONIX) 40 MG tablet Take 1 tablet (40 mg total) by mouth daily.   rOPINIRole (REQUIP) 0.25 MG tablet Take 0.25 mg by mouth at bedtime.   SENSIPAR 30 MG tablet Take 30 mg by mouth daily.   tamsulosin (FLOMAX) 0.4 MG CAPS capsule Take 0.4 mg by mouth daily.   zolpidem (AMBIEN) 10 MG tablet Take 10 mg by mouth at bedtime.   Current Facility-Administered Medications for the 12/04/22 encounter (Office  Visit) with Park Liter, MD  Medication   regadenoson (LEXISCAN) injection SOLN 0.4 mg   technetium tetrofosmin (TC-MYOVIEW) injection 97.9 millicurie   technetium tetrofosmin (TC-MYOVIEW) injection 89.2 millicurie     Allergies:   Penicillins   Social History   Socioeconomic History   Marital status: Married    Spouse name: Not on file   Number of children: Not on file   Years of education: Not on file   Highest education level: Not on file  Occupational History   Not on file  Tobacco Use   Smoking status: Former    Packs/day: 2.00    Years: 30.00    Total pack years:  60.00    Types: Cigarettes    Start date: 68    Quit date: 1998    Years since quitting: 26.0   Smokeless tobacco: Never  Vaping Use   Vaping Use: Never used  Substance and Sexual Activity   Alcohol use: Yes    Comment: Occasionally.   Drug use: Never   Sexual activity: Not on file  Other Topics Concern   Not on file  Social History Narrative   Not on file   Social Determinants of Health   Financial Resource Strain: Not on file  Food Insecurity: Not on file  Transportation Needs: Not on file  Physical Activity: Not on file  Stress: Not on file  Social Connections: Not on file     Family History: The patient's family history includes Cancer in his maternal grandmother. ROS:   Please see the history of present illness.    All 14 point review of systems negative except as described per history of present illness  EKGs/Labs/Other Studies Reviewed:      Recent Labs: 05/16/2022: ALT 16; BUN 13; Creatinine, Ser 5.05; Hemoglobin 9.6; Magnesium 2.2; Platelets 106; Potassium 3.7; Sodium 139  Recent Lipid Panel    Component Value Date/Time   CHOL 86 (L) 01/02/2020 0952   TRIG 41 01/02/2020 0952   HDL 50 01/02/2020 0952   CHOLHDL 1.7 01/02/2020 0952   CHOLHDL 2.5 03/25/2019 0454   VLDL 16 03/25/2019 0454   LDLCALC 24 01/02/2020 0952    Physical Exam:    VS:  BP 120/60 (BP  Location: Left Arm, Patient Position: Sitting, Cuff Size: Normal)   Pulse 82   Ht 6\' 2"  (1.88 m)   Wt 207 lb (93.9 kg)   SpO2 91%   BMI 26.58 kg/m     Wt Readings from Last 3 Encounters:  12/04/22 207 lb (93.9 kg)  12/03/22 207 lb 3.2 oz (94 kg)  11/04/22 204 lb (92.5 kg)     GEN:  Well nourished, well developed in no acute distress HEENT: Normal NECK: No JVD; No carotid bruits LYMPHATICS: No lymphadenopathy CARDIAC: RRR, no murmurs, no rubs, no gallops RESPIRATORY:  Clear to auscultation without rales, wheezing or rhonchi  ABDOMEN: Soft, non-tender, non-distended MUSCULOSKELETAL:  No edema; No deformity  SKIN: Warm and dry LOWER EXTREMITIES: no swelling NEUROLOGIC:  Alert and oriented x 3 PSYCHIATRIC:  Normal affect   ASSESSMENT:    1. Ischemic cardiomyopathy   2. Essential (primary) hypertension   3. Aneurysm of ascending aorta without rupture ([place])   4. End stage renal disease ([place])    PLAN:    In order of problems listed above:  Ischemic cardiomyopathy.  Echocardiogram scheduled for February will wait for results of it.  There are some conversation about potentially putting pulmonary artery monitoring to him. Essential hypertension blood pressure well-controlled but during dialysis he can drop her blood pressure to the point of passing out however he is wearing monitor to make sure he does not have any significant arrhythmia and that is responsible for that especially in view of his severe cardiomyopathy. Aneurysm status of the ascending aorta think he would be candidate for any surgical intervention on the right now. End-stage renal disease.  He is on dialysis having difficulty tolerating dialysis.   Medication Adjustments/Labs and Tests Ordered: Current medicines are reviewed at length with the patient today.  Concerns regarding medicines are outlined above.  No orders of the defined types were placed in this encounter.  Medication changes: No orders  of the  defined types were placed in this encounter.   Signed, Park Liter, MD, [facility] 12/04/2022 8:22 AM    [place]

## 2022-12-04 NOTE — Patient Instructions (Signed)
Medication Instructions:  Your physician recommends that you continue on your current medications as directed. Please refer to the Current Medication list given to you today.  *If you need a refill on your cardiac medications before your next appointment, please call your pharmacy*   Lab Work: None If you have labs (blood work) drawn today and your tests are completely normal, you will receive your results only by: [place] (if you have MyChart) OR A paper copy in the mail If you have any lab test that is abnormal or we need to change your treatment, we will call you to review the results.   Testing/Procedures: None   Follow-Up: At [facility], you and your health needs are our priority.  As part of our continuing mission to provide you with exceptional heart care, we have created designated Provider Care Teams.  These Care Teams include your primary Cardiologist (physician) and Advanced Practice Providers (APPs -  Physician Assistants and Nurse Practitioners) who all work together to provide you with the care you need, when you need it.  We recommend signing up for the patient portal called "MyChart".  Sign up information is provided on this After Visit Summary.  MyChart is used to connect with patients for Virtual Visits (Telemedicine).  Patients are able to view lab/test results, encounter notes, upcoming appointments, etc.  Non-urgent messages can be sent to your provider as well.   To learn more about what you can do with MyChart, go to [email or web address].    Your next appointment:   2 month(s)  Provider:   Jenne Campus, MD  Other Instructions None

## 2022-12-06 ENCOUNTER — Telehealth: Payer: Self-pay | Admitting: Home Health

## 2022-12-06 NOTE — Telephone Encounter (Signed)
Patient called after hour line today, states he was placed on Zio monitor for 2 weeks since 12/03/22. He has developed redness and extreme pruritus where Zio was applied. He states he wants to scratch the area very bad and is not able to continue tolerate the application and wishes it's removed. He recalls hx of allergy to tape, suspect this is similar situation, he denied any pain or swelling from the area, less likely cellulitis. Advised the patient OK to remove the Zio patch for now. Will forward this message to Dr Haroldine Laws regarding possible alternative method for arrhythmia monitor. Patient is agreeable.

## 2022-12-24 ENCOUNTER — Other Ambulatory Visit: Payer: Self-pay | Admitting: Thoracic Surgery (Cardiothoracic Vascular Surgery)

## 2022-12-24 DIAGNOSIS — I7121 Aneurysm of the ascending aorta, without rupture: Secondary | ICD-10-CM

## 2023-01-04 ENCOUNTER — Other Ambulatory Visit (HOSPITAL_COMMUNITY): Payer: Self-pay

## 2023-01-04 ENCOUNTER — Ambulatory Visit (HOSPITAL_BASED_OUTPATIENT_CLINIC_OR_DEPARTMENT_OTHER)
Admission: RE | Admit: 2023-01-04 | Discharge: 2023-01-04 | Disposition: A | Discharge status: Home / Self Care | Payer: Medicare HMO | Source: Ambulatory Visit | Attending: Internal Medicine | Admitting: Internal Medicine

## 2023-01-04 ENCOUNTER — Encounter (HOSPITAL_COMMUNITY): Payer: Self-pay | Admitting: Internal Medicine

## 2023-01-04 ENCOUNTER — Ambulatory Visit (HOSPITAL_COMMUNITY)
Admission: RE | Admit: 2023-01-04 | Discharge: 2023-01-04 | Disposition: A | Discharge status: Home / Self Care | Payer: Medicare HMO | Source: Ambulatory Visit | Attending: Family | Admitting: Family

## 2023-01-04 VITALS — BP 100/50 | HR 76 | Wt 205.6 lb

## 2023-01-04 DIAGNOSIS — I493 Ventricular premature depolarization: Secondary | ICD-10-CM | POA: Diagnosis not present

## 2023-01-04 DIAGNOSIS — I5022 Chronic systolic (congestive) heart failure: Secondary | ICD-10-CM

## 2023-01-04 DIAGNOSIS — I10 Essential (primary) hypertension: Secondary | ICD-10-CM | POA: Diagnosis not present

## 2023-01-04 DIAGNOSIS — I252 Old myocardial infarction: Secondary | ICD-10-CM | POA: Insufficient documentation

## 2023-01-04 DIAGNOSIS — I081 Rheumatic disorders of both mitral and tricuspid valves: Secondary | ICD-10-CM | POA: Diagnosis not present

## 2023-01-04 DIAGNOSIS — Z951 Presence of aortocoronary bypass graft: Secondary | ICD-10-CM | POA: Diagnosis not present

## 2023-01-04 DIAGNOSIS — E1122 Type 2 diabetes mellitus with diabetic chronic kidney disease: Secondary | ICD-10-CM | POA: Diagnosis not present

## 2023-01-04 DIAGNOSIS — I5040 Unspecified combined systolic (congestive) and diastolic (congestive) heart failure: Secondary | ICD-10-CM | POA: Diagnosis not present

## 2023-01-04 DIAGNOSIS — I340 Nonrheumatic mitral (valve) insufficiency: Secondary | ICD-10-CM

## 2023-01-04 DIAGNOSIS — I444 Left anterior fascicular block: Secondary | ICD-10-CM | POA: Diagnosis not present

## 2023-01-04 DIAGNOSIS — N186 End stage renal disease: Secondary | ICD-10-CM

## 2023-01-04 DIAGNOSIS — I2510 Atherosclerotic heart disease of native coronary artery without angina pectoris: Secondary | ICD-10-CM | POA: Insufficient documentation

## 2023-01-04 LAB — ECHOCARDIOGRAM COMPLETE
AR max vel: 3.02 cm2
AV Area VTI: 2.31 cm2
AV Area mean vel: 2.45 cm2
AV Mean grad: 6 mmHg
AV Peak grad: 10.2 mmHg
Ao pk vel: 1.6 m/s
Area-P 1/2: 4.31 cm2
Calc EF: 24.7 %
MV M vel: 4.39 m/s
MV Peak grad: 77.1 mmHg
Radius: 0.5 cm
S' Lateral: 5.3 cm
Single Plane A2C EF: 14.5 %
Single Plane A4C EF: 36 %

## 2023-01-04 LAB — BASIC METABOLIC PANEL
Anion gap: 17 — ABNORMAL HIGH (ref 5–15)
BUN: 38 mg/dL — ABNORMAL HIGH (ref 8–23)
CO2: 24 mmol/L (ref 22–32)
Calcium: 8.8 mg/dL — ABNORMAL LOW (ref 8.9–10.3)
Chloride: 94 mmol/L — ABNORMAL LOW (ref 98–111)
Creatinine, Ser: 7.74 mg/dL — ABNORMAL HIGH (ref 0.61–1.24)
GFR, Estimated: 7 mL/min — ABNORMAL LOW (ref >60)
Glucose, Bld: 71 mg/dL (ref 70–99)
Potassium: 4.1 mmol/L (ref 3.5–5.1)
Sodium: 135 mmol/L (ref 135–145)

## 2023-01-04 NOTE — Patient Instructions (Signed)
There has been no changes to your medications.  Labs done today, your results will be available in MyChart, we will contact you for abnormal readings.  You are scheduled for a TEE (Transesophageal Echocardiogram) on Tuesday, March 19 with Dr. Haroldine Laws.  Please arrive at the [facility] (Main Entrance A) at [facility]: [address] [place], [place] [address] at 6:30 AM.    DIET:  Nothing to eat or drink after midnight except a sip of water with medications (see medication instructions below)  MEDICATION INSTRUCTIONS:         HOLD: Dulaglutide (Trulicity) for 7 days before procedure   HOLD Glipizide and Tradjenta the morning of procedure 02/09/23   You are scheduled for a Cardiac Catheterization on Tuesday, March 19 with Dr. Glori Bickers.  2. Diet: Do not eat solid foods after midnight.  The patient may have clear liquids until 5am upon the day of the procedure.  3. Medication instructions in preparation for your procedure:   Contrast Allergy: No  On the morning of your procedure, take your Aspirin 81 mg and any morning medicines NOT listed above.  You may use sips of water.  5. Plan for one night stay--bring personal belongings. 6. Bring a current list of your medications and current insurance cards. 7. You MUST have a responsible person to drive you home. 8. Someone MUST be with you the first 24 hours after you arrive home or your discharge will be delayed. 9. Please wear clothes that are easy to get on and off and wear slip-on shoes.   Your physician recommends that you schedule a follow-up appointment in: 3 months (May 2024)  ** please call the office in March to arrange your follow up appointment. **  If you have any questions or concerns before your next appointment please send [place] a message through [place] or call our office at [phone].    TO LEAVE A MESSAGE FOR THE NURSE SELECT OPTION 2, PLEASE LEAVE A MESSAGE INCLUDING: YOUR NAME DATE OF BIRTH CALL  BACK NUMBER REASON FOR CALL**this is important as we prioritize the call backs  YOU WILL RECEIVE A CALL BACK THE SAME DAY AS LONG AS YOU CALL BEFORE 4:00 PM  At the [facility], you and your health needs are our priority. As part of our continuing mission to provide you with exceptional heart care, we have created designated Provider Care Teams. These Care Teams include your primary Cardiologist (physician) and Advanced Practice Providers (APPs- Physician Assistants and Nurse Practitioners) who all work together to provide you with the care you need, when you need it.   You may see any of the following providers on your designated Care Team at your next follow up: Dr Glori Bickers Dr Loralie Champagne Dr. Roxana Hires, NP Lyda Jester, [facility] [place], [place] Forestine Na, NP Audry Riles, PharmD   Please be sure to bring in all your medications bottles to every appointment.    Thank you for choosing [facility]

## 2023-01-04 NOTE — Progress Notes (Signed)
  Echocardiogram 2D Echocardiogram has been performed.  John [name] 01/04/2023, 2:40 PM

## 2023-01-04 NOTE — Progress Notes (Signed)
ReDS Vest / Clip - 01/04/23 1600       ReDS Vest / Clip   Station Marker D    Ruler Value 34    ReDS Value Range Low volume    ReDS Actual Value 34

## 2023-01-04 NOTE — Progress Notes (Signed)
ADVANCED HF CLINIC NOTE   Primary Care: Rhea Bleacher, NP Primary Cardiologist: Jenne Campus, MD HF Cardiologist: Dr. Haroldine Laws   HPI:  John [name] is a 72 y.o.. male history of coronary artery disease with prior PCI to the RCA, DMII, HTN, PAD s/p aortofemoral bypass and ESRD on HD,     Presented 3' w/ CP and elevated Hstop: Cath revealed severe multivessel disease. Underwent a four-vessel CABG 5/20 with LIMA to LAD, SVG to diagonal, SVG to OM, and SVG to PDA.  Had endoscopic harvesting of the greater saphenous vein from his right leg. Postoperatively he had thrombocytopenia on Lovenox and this was dc'd, also found to have postop CHF and was diuresed aggressively. CXR showed some loculated large fluid in the left pleura, also echo being done under some suspicion for endocarditis of the aortic valve. TEE no active endocarditis. He did have some calcification of the leaflet of the aortic valve.  Found to have chronic pleural effusion 3/21. Had video bronchoscopy done as well as left VATS with drainage of empyema as well as decortication and intercostal nerve block.    Had chronic renal failure progressively worse until October 21' when he started HD.  10/22 had echo d/t DOE. Had worsening of CAD, his EF was 35 to 40%.  Stress test has been done to rule out ischemia as potential cause of his worsening EF, however did not show any significant ischemia. GDMT started but difficulty managing 2/2 hypotension. Recent echo 9/23 EF down to 20 to 25%. mild LVH. RV size is normal. Severely elevated pulmonary artery systolic pressure. LA mildly elevated, RA mod dilated. Mild MR, Mod TR. Referred to AHF clinic for further management with worsening HFrEF.   PYP 11/23 negative   Dr. Haroldine Laws first saw him 9/23, and he was markedly volume overloaded. Dr. Haroldine Laws d/w Dr. [name] and the plan was to pull more fluid with HD and decrease his dry weight. They were able to get him down from 208 to  195 lbs.  Follow up 1/24,  REDs 41%, volume remained up but improving. NYHA IIIb.  Today he returns for HF follow up. Overall feeling better, still has cough but it has improved. He is not SOB walking on flat ground, feels pretty weak and poor energy. Limited by leg weakness/numbness. Had CP at HD this week, resolved when he massaged his left chest. Has had occasional dizziness when standing. + Orthopnea. Denies palpitations, edema, or PND. Appetite ok. No fever or chills. Dry weight 89.9 kg. Taking all medications. Could not afford CPAP. Had to stop HD early this past week until his BP came up.  Echo today 01/04/23 EF 20-25% grade II DD, RV moderately down, severe MR, moderate TR  Cardiac studies: - Echo 9/23 EF down to 20 to 25%. mild LVH. RV size is normal. Severely elevated pulmonary artery systolic pressure. LA mildly elevated, RA mod dilated. Mild MR, Mod TR.   - Echo 3/23 EF 30-35%  - Echo 10/22 EF 35-40%  - Lexiscan 2/22 normal  - Echo 8/21, TEE 2/21, Echo 9/21 EF 40-45%  - Echo 5/20 EF 50-55%  - LHC 2020: severe multivessel disease  - Echo 10/18 EF 55-60%  - LHC 2020: Mid Cx lesion is 90% stenosed., Ost Cx to Prox Cx lesion is 80% stenosed. Prox LAD to Mid LAD lesion is 75% stenosed. This lesion is hemodynamically significant and had a markedly positive DFR 0.71. Ost 1st Diag lesion is 70% stenosed.Prox RCA to Mid  RCA lesion is 75% stenosed.Dist RCA-1 lesion is 40% stenosed. Prior stent with mild instent restenosis.Dist RCA-2 lesion is 80% stenosed.LV end diastolic pressure is normal.There is no aortic valve stenosis.   Past Medical History:  Diagnosis Date   Allergy, unspecified, initial encounter 08/30/2020   Anaphylactic shock, unspecified, initial encounter 08/30/2020   Anemia in chronic kidney disease 08/30/2020   Arthritis    Back pain 09/07/2017   Carotid artery stenosis    [id number] LICA, [id number] RICA [id number] [place]   Chest pain 03/24/2019   Chronic infective endocarditis     Chronic kidney disease (CKD), stage IV (severe) ([place]) 12/01/2018   CKD (chronic kidney disease), stage V ([place]) 03/24/2019   Coronary artery disease    Dependence on renal dialysis ([place]) 08/31/2020   Diabetes mellitus without complication (HCC)    Disorder of phosphorus metabolism, unspecified 08/31/2020   DM (diabetes mellitus), type 2 with renal complications ([place]) [id number]   Dyspnea    Encounter for screening for respiratory tuberculosis 08/30/2020   End stage renal disease ([place]) 09/07/2017   ESRD (end stage renal disease) ([place]) 08/27/2020   Essential (primary) hypertension 09/07/2017   Fever    History of endocarditis 01/09/2020   Hypercalcemia 10/08/2020   Hyperlipidemia, unspecified 09/07/2017   Hypertension    Hypokalemia 11/13/2021   Iron deficiency anemia, unspecified 08/30/2020   Low back pain 09/07/2017   MI (myocardial infarction) ([place])    MSSA bacteremia 09/10/2017   Non-ST elevation (NSTEMI) myocardial infarction ([place]) 01/09/2020   NSTEMI (non-ST elevated myocardial infarction) ([place]) 03/24/2019   Other specified arthritis, other site 08/31/2020   Pain, unspecified 08/30/2020   Personal history of other diseases of the circulatory system 08/31/2020   Presence of aortocoronary bypass graft 08/31/2020   Pruritus, unspecified 08/30/2020   Renal disorder    Restless legs syndrome 09/06/2020   S/P CABG x 4 04/03/2019   S/P Video Bronchoscopy, Right VATS with drainage of pleural effusion, decortication, intercostal nerve block 02/20/2020   Secondary hyperparathyroidism of renal origin ([place]) 08/30/2020   Thoracic aortic aneurysm (TAA) (HCC)    4.3 cm ascending TAA 02/02/20 CT   Type 2 diabetes mellitus with diabetic nephropathy ([place]) 08/30/2020   Current Outpatient Medications  Medication Sig Dispense Refill   acetaminophen (TYLENOL) 500 MG tablet Take 1,500 mg by mouth 2 (two) times daily as needed for mild pain or moderate pain (pain).     aspirin EC 81 MG tablet Take 81 mg by mouth daily.      atorvastatin (LIPITOR) 40 MG tablet Take 40 mg by mouth daily in the afternoon.      Dulaglutide 1.5 MG/0.5ML SOPN Inject 1.5 mg into the skin every Sunday.     fluticasone (FLONASE) 50 MCG/ACT nasal spray Place 1 spray into both nostrils daily as needed for allergies or rhinitis.     gabapentin (NEURONTIN) 100 MG capsule Take 2 capsules (200 mg total) by mouth at bedtime.     glipiZIDE (GLUCOTROL XL) 2.5 MG 24 hr tablet Take 2.5 mg by mouth 2 (two) times daily.     linagliptin (TRADJENTA) 5 MG TABS tablet Take 5 mg by mouth daily.     midodrine (PROAMATINE) 10 MG tablet Take 1 tablet (10 mg total) by mouth 3 (three) times a week. Before Dialysis every Mon, Wed, Fri 45 tablet 3   OVER THE COUNTER MEDICATION Place 1 drop into both eyes 2 (two) times daily as needed (dry eyes). Hyper Tears eye drops/Unknown strength  pantoprazole (PROTONIX) 40 MG tablet Take 1 tablet (40 mg total) by mouth daily. 30 tablet 5   rOPINIRole (REQUIP) 0.25 MG tablet Take 0.25 mg by mouth at bedtime.     SENSIPAR 30 MG tablet Take 30 mg by mouth daily.     tamsulosin (FLOMAX) 0.4 MG CAPS capsule Take 0.4 mg by mouth daily.     zolpidem (AMBIEN) 10 MG tablet Take 10 mg by mouth at bedtime.     Current Facility-Administered Medications  Medication Dose Route Frequency Provider Last Rate Last Admin   regadenoson (LEXISCAN) injection SOLN 0.4 mg  0.4 mg Intravenous Once Revankar, Reita Cliche, MD       technetium tetrofosmin (TC-MYOVIEW) injection [id number] millicurie  [id number] millicurie Intravenous Once PRN Revankar, Reita Cliche, MD       technetium tetrofosmin (TC-MYOVIEW) injection [id number] millicurie  [id number] millicurie Intravenous Once PRN Revankar, Reita Cliche, MD       Allergies  Allergen Reactions   Penicillins Rash and Other (See Comments)    Did it involve swelling of the face/tongue/throat, SOB, or low BP? No Did it involve sudden or severe rash/hives, skin peeling, or any reaction on the inside of your mouth or nose? Yes Did  you need to seek medical attention at a hospital or doctor's office? Yes When did it last happen?      Childhood allergy If all above answers are "NO", may proceed with cephalosporin use.    Social History   Socioeconomic History   Marital status: Married    Spouse name: Not on file   Number of children: Not on file   Years of education: Not on file   Highest education level: Not on file  Occupational History   Not on file  Tobacco Use   Smoking status: Former    Packs/day: 2.00    Years: 30.00    Total pack years: 60.00    Types: Cigarettes    Start date: 101    Quit date: 1998    Years since quitting: 26.1   Smokeless tobacco: Never  Vaping Use   Vaping Use: Never used  Substance and Sexual Activity   Alcohol use: Yes    Comment: Occasionally.   Drug use: Never   Sexual activity: Not on file  Other Topics Concern   Not on file  Social History Narrative   Not on file   Social Determinants of Health   Financial Resource Strain: Not on file  Food Insecurity: Not on file  Transportation Needs: Not on file  Physical Activity: Not on file  Stress: Not on file  Social Connections: Not on file  Intimate Partner Violence: Not on file   Family History  Problem Relation Age of Onset   Cancer Maternal Grandmother    BP (!) 100/50   Pulse 76   Wt 93.3 kg (205 lb 9.6 oz)   SpO2 95%   BMI 26.40 kg/m   Wt Readings from Last 3 Encounters:  01/04/23 93.3 kg (205 lb 9.6 oz)  12/04/22 93.9 kg (207 lb)  12/03/22 94 kg (207 lb 3.2 oz)   PHYSICAL EXAM: General:  NAD. No resp difficulty, elderly HEENT: Normal Neck: Supple. JVP 7-8. Carotids 2+ bilat; no bruits. No lymphadenopathy or thryomegaly appreciated. Cor: PMI nondisplaced. Regular rate & rhythm. No rubs, gallops, 2/6 MR Lungs: Clear Abdomen: Soft, nontender, nondistended. No hepatosplenomegaly. No bruits or masses. Good bowel sounds. Extremities: No cyanosis, clubbing, rash, trace pedal edema Neuro: Alert &  oriented x  3, cranial nerves grossly intact. Moves all 4 extremities w/o difficulty. Affect pleasant.  ECG (personally reviewed): NSR 75 bpm  REDs: 50%-->41%-->34% today  ASSESSMENT & PLAN: Chronic systolic heart failure - Echo 10/18 EF 55-60% - LHC 2020: severe multivessel disease, underwent CABG x4 - Echo 8/21, TEE 2/21, Echo 9/21 EF 40-45% - Lexiscan 2/22 normal - Echo 10/22 EF 35-40% - EF 3/23 EF 30-35% - Echo 9/23 EF down to 20 to 25%. mild LVH. RV size is normal. Severely elevated pulmonary artery systolic pressure. LA mildly elevated, RA mod dilated. Mild MR, Mod TR.  - Echo today 01/04/23 showed EF EF 20-25% grade II DD, RV moderately down, RVSP 70 mmHg, severe MR, moderate TR - NYHA III-IIIB, volume looks good today, REDs down to 34% today. Unable to start GDMT w/ ESRD requiring midodrine for BP support. - PYP negative - Continue pre-HD midodrine 10 mg. Dr. Haroldine Laws spoke with Dr. [name]. They will continue to work on dropping his dry weight. - Not candidate for VAD/transplant with age and ESRD.  - Volume better but he is still struggling with NYHA III symptoms. Discussed RHC to assess hemodynamics, risks/benefits and he is agreeable.    2. MR - Severe on echo today. - Arrange TEE to evaluate valve, ? Candidacy for MitraClip. - Discussed procedure, risks/benefits and he is agreeable. He wants to wait until March until after wife's knee replacement   3. CAD, ICM s/p CABG x4 - Previous smoker, quit in 70' - LHC 2020: severe multivessel disease, s/p CABG x4 - Continue ASA and statin - Recent CP and known CAD. - Arrange LHC at time of RHC, to evaluate cors.  4. ESRD on HD - T/Th/Sat schedule. - Plan as above.  5. HTN - resolved. Now requiring midodrine - Has been getting hypotensive with HD.  6. DM II - Managed by PCP. - Hgb A1c last 6.3   7. SOB/Persistent cough - Follows w/ Dr. Lamonte Sakai. - Suspect 2/2 volume overload.  Arrange for TEE and R/LHC with Dr. Haroldine Laws  February 09, 2023.  Rafael Bihari, FNP  3:50 PM  Patient seen and examined with the above-signed Advanced Practice Provider and/or Housestaff. I personally reviewed laboratory data, imaging studies and relevant notes. I independently examined the patient and formulated the important aspects of the plan. I have edited the note to reflect any of my changes or salient points. I have personally discussed the plan with the patient and/or family.  Volume status improved but still quite symptomatic. Echo today with EF 20-25% severe MR and significant PH. BP dropping with HD requiring midodrine.  General:  Well appearing. No resp difficulty HEENT: normal Neck: supple. JVP 7-8 . Carotids 2+ bilat; no bruits. No lymphadenopathy or thryomegaly appreciated. Cor: PMI nondisplaced. Regular rate & rhythm. 2/6 MR Lungs: clear Abdomen: soft, nontender, nondistended. No hepatosplenomegaly. No bruits or masses. Good bowel sounds. Extremities: no cyanosis, clubbing, rash, trace edema Neuro: alert & orientedx3, cranial nerves grossly intact. moves all 4 extremities w/o difficulty. Affect pleasant  Still quite symptomatic despite improved volume control. Plan TEE and R/L cath to look more closely at MV for possible mTEER.  Glori Bickers, MD  10:00 AM

## 2023-01-08 ENCOUNTER — Encounter: Payer: Self-pay | Admitting: Internal Medicine

## 2023-01-11 ENCOUNTER — Telehealth: Payer: Self-pay | Admitting: *Deleted

## 2023-01-11 NOTE — Telephone Encounter (Signed)
No pre cert reqd for TEE per [employer].

## 2023-01-13 ENCOUNTER — Other Ambulatory Visit: Payer: Self-pay | Admitting: Cardiology

## 2023-02-02 ENCOUNTER — Telehealth: Payer: Self-pay

## 2023-02-02 NOTE — Telephone Encounter (Signed)
Spoke with patient regarding upcoming pre visit and colonoscopy.  Let patient know that a nurse would be calling to reschedule him at the hospital for his colonoscopy to ensure best possible patient safety.  Patient verbalized understanding.

## 2023-02-02 NOTE — Telephone Encounter (Signed)
Why is this being sent to me? I am not his GI doctor. Thanks, Dr. Henrene Pastor

## 2023-02-02 NOTE — Telephone Encounter (Signed)
OV scheduled for 03/17/23.

## 2023-02-03 ENCOUNTER — Ambulatory Visit
Admission: RE | Admit: 2023-02-03 | Discharge: 2023-02-03 | Disposition: A | Discharge status: Home / Self Care | Payer: Medicare HMO | Source: Ambulatory Visit | Attending: Thoracic Surgery (Cardiothoracic Vascular Surgery) | Admitting: Thoracic Surgery (Cardiothoracic Vascular Surgery)

## 2023-02-03 DIAGNOSIS — I7121 Aneurysm of the ascending aorta, without rupture: Secondary | ICD-10-CM

## 2023-02-09 ENCOUNTER — Other Ambulatory Visit: Payer: Self-pay

## 2023-02-09 ENCOUNTER — Encounter (HOSPITAL_COMMUNITY)
Admission: RE | Disposition: A | Discharge status: Home / Self Care | Payer: Self-pay | Source: Home / Self Care | Attending: Internal Medicine

## 2023-02-09 ENCOUNTER — Ambulatory Visit (HOSPITAL_COMMUNITY): Payer: Medicare HMO | Admitting: Certified Registered Nurse Anesthetist

## 2023-02-09 ENCOUNTER — Encounter (HOSPITAL_COMMUNITY): Payer: Self-pay | Admitting: Internal Medicine

## 2023-02-09 ENCOUNTER — Ambulatory Visit (HOSPITAL_COMMUNITY)
Admission: RE | Admit: 2023-02-09 | Discharge: 2023-02-09 | Disposition: A | Discharge status: Home / Self Care | Payer: Medicare HMO | Attending: Internal Medicine | Admitting: Internal Medicine

## 2023-02-09 ENCOUNTER — Ambulatory Visit: Payer: Medicare HMO | Admitting: Cardiology

## 2023-02-09 ENCOUNTER — Ambulatory Visit (HOSPITAL_BASED_OUTPATIENT_CLINIC_OR_DEPARTMENT_OTHER): Payer: Medicare HMO

## 2023-02-09 ENCOUNTER — Ambulatory Visit (HOSPITAL_BASED_OUTPATIENT_CLINIC_OR_DEPARTMENT_OTHER): Payer: Medicare HMO | Admitting: Certified Registered Nurse Anesthetist

## 2023-02-09 ENCOUNTER — Ambulatory Visit: Payer: Medicare HMO | Admitting: Thoracic Surgery (Cardiothoracic Vascular Surgery)

## 2023-02-09 ENCOUNTER — Ambulatory Visit (HOSPITAL_COMMUNITY): Admission: RE | Admit: 2023-02-09 | Payer: Medicare HMO | Source: Home / Self Care | Admitting: Internal Medicine

## 2023-02-09 DIAGNOSIS — I361 Nonrheumatic tricuspid (valve) insufficiency: Secondary | ICD-10-CM | POA: Diagnosis not present

## 2023-02-09 DIAGNOSIS — N186 End stage renal disease: Secondary | ICD-10-CM

## 2023-02-09 DIAGNOSIS — I2510 Atherosclerotic heart disease of native coronary artery without angina pectoris: Secondary | ICD-10-CM | POA: Insufficient documentation

## 2023-02-09 DIAGNOSIS — E1122 Type 2 diabetes mellitus with diabetic chronic kidney disease: Secondary | ICD-10-CM | POA: Diagnosis not present

## 2023-02-09 DIAGNOSIS — Z955 Presence of coronary angioplasty implant and graft: Secondary | ICD-10-CM | POA: Insufficient documentation

## 2023-02-09 DIAGNOSIS — I5022 Chronic systolic (congestive) heart failure: Secondary | ICD-10-CM | POA: Diagnosis not present

## 2023-02-09 DIAGNOSIS — I081 Rheumatic disorders of both mitral and tricuspid valves: Secondary | ICD-10-CM

## 2023-02-09 DIAGNOSIS — I083 Combined rheumatic disorders of mitral, aortic and tricuspid valves: Secondary | ICD-10-CM | POA: Diagnosis not present

## 2023-02-09 DIAGNOSIS — Z79899 Other long term (current) drug therapy: Secondary | ICD-10-CM | POA: Diagnosis not present

## 2023-02-09 DIAGNOSIS — I252 Old myocardial infarction: Secondary | ICD-10-CM | POA: Diagnosis not present

## 2023-02-09 DIAGNOSIS — I132 Hypertensive heart and chronic kidney disease with heart failure and with stage 5 chronic kidney disease, or end stage renal disease: Secondary | ICD-10-CM

## 2023-02-09 DIAGNOSIS — Z951 Presence of aortocoronary bypass graft: Secondary | ICD-10-CM | POA: Diagnosis not present

## 2023-02-09 DIAGNOSIS — I340 Nonrheumatic mitral (valve) insufficiency: Secondary | ICD-10-CM

## 2023-02-09 DIAGNOSIS — Z992 Dependence on renal dialysis: Secondary | ICD-10-CM | POA: Diagnosis not present

## 2023-02-09 DIAGNOSIS — Z87891 Personal history of nicotine dependence: Secondary | ICD-10-CM | POA: Diagnosis not present

## 2023-02-09 DIAGNOSIS — I255 Ischemic cardiomyopathy: Secondary | ICD-10-CM | POA: Diagnosis not present

## 2023-02-09 DIAGNOSIS — R053 Chronic cough: Secondary | ICD-10-CM | POA: Diagnosis not present

## 2023-02-09 DIAGNOSIS — I509 Heart failure, unspecified: Secondary | ICD-10-CM

## 2023-02-09 DIAGNOSIS — E1151 Type 2 diabetes mellitus with diabetic peripheral angiopathy without gangrene: Secondary | ICD-10-CM | POA: Diagnosis not present

## 2023-02-09 DIAGNOSIS — R0602 Shortness of breath: Secondary | ICD-10-CM | POA: Diagnosis not present

## 2023-02-09 DIAGNOSIS — Z7982 Long term (current) use of aspirin: Secondary | ICD-10-CM | POA: Diagnosis not present

## 2023-02-09 HISTORY — PX: RIGHT HEART CATH AND CORONARY/GRAFT ANGIOGRAPHY: CATH118265

## 2023-02-09 HISTORY — PX: TEE WITHOUT CARDIOVERSION: SHX5443

## 2023-02-09 LAB — POCT I-STAT EG7
Acid-Base Excess: 5 mmol/L — ABNORMAL HIGH (ref 0.0–2.0)
Acid-Base Excess: 6 mmol/L — ABNORMAL HIGH (ref 0.0–2.0)
Bicarbonate: 30 mmol/L — ABNORMAL HIGH (ref 20.0–28.0)
Bicarbonate: 30.6 mmol/L — ABNORMAL HIGH (ref 20.0–28.0)
Calcium, Ion: 1.07 mmol/L — ABNORMAL LOW (ref 1.15–1.40)
Calcium, Ion: 1.09 mmol/L — ABNORMAL LOW (ref 1.15–1.40)
HCT: 33 % — ABNORMAL LOW (ref 39.0–52.0)
HCT: 35 % — ABNORMAL LOW (ref 39.0–52.0)
Hemoglobin: 11.2 g/dL — ABNORMAL LOW (ref 13.0–17.0)
Hemoglobin: 11.9 g/dL — ABNORMAL LOW (ref 13.0–17.0)
O2 Saturation: 68 %
O2 Saturation: 71 %
Potassium: 4.1 mmol/L (ref 3.5–5.1)
Potassium: 4.2 mmol/L (ref 3.5–5.1)
Sodium: 137 mmol/L (ref 135–145)
Sodium: 138 mmol/L (ref 135–145)
TCO2: 31 mmol/L (ref 22–32)
TCO2: 32 mmol/L (ref 22–32)
pCO2, Ven: 44.4 mmHg (ref 44–60)
pCO2, Ven: 44.8 mmHg (ref 44–60)
pH, Ven: 7.437 — ABNORMAL HIGH (ref 7.25–7.43)
pH, Ven: 7.442 — ABNORMAL HIGH (ref 7.25–7.43)
pO2, Ven: 35 mmHg (ref 32–45)
pO2, Ven: 36 mmHg (ref 32–45)

## 2023-02-09 LAB — CBC
HCT: 35.8 % — ABNORMAL LOW (ref 39.0–52.0)
Hemoglobin: 12 g/dL — ABNORMAL LOW (ref 13.0–17.0)
MCH: 33.5 pg (ref 26.0–34.0)
MCHC: 33.5 g/dL (ref 30.0–36.0)
MCV: 100 fL (ref 80.0–100.0)
Platelets: 149 10*3/uL — ABNORMAL LOW (ref 150–400)
RBC: 3.58 MIL/uL — ABNORMAL LOW (ref 4.22–5.81)
RDW: 14.4 % (ref 11.5–15.5)
WBC: 6.7 10*3/uL (ref 4.0–10.5)
nRBC: 0 % (ref 0.0–0.2)

## 2023-02-09 LAB — POCT I-STAT 7, (LYTES, BLD GAS, ICA,H+H)
Acid-Base Excess: 3 mmol/L — ABNORMAL HIGH (ref 0.0–2.0)
Bicarbonate: 26.7 mmol/L (ref 20.0–28.0)
Calcium, Ion: 0.92 mmol/L — ABNORMAL LOW (ref 1.15–1.40)
HCT: 31 % — ABNORMAL LOW (ref 39.0–52.0)
Hemoglobin: 10.5 g/dL — ABNORMAL LOW (ref 13.0–17.0)
O2 Saturation: 94 %
Potassium: 3.8 mmol/L (ref 3.5–5.1)
Sodium: 141 mmol/L (ref 135–145)
TCO2: 28 mmol/L (ref 22–32)
pCO2 arterial: 37.2 mmHg (ref 32–48)
pH, Arterial: 7.465 — ABNORMAL HIGH (ref 7.35–7.45)
pO2, Arterial: 66 mmHg — ABNORMAL LOW (ref 83–108)

## 2023-02-09 LAB — BASIC METABOLIC PANEL
Anion gap: 13 (ref 5–15)
BUN: 19 mg/dL (ref 8–23)
CO2: 29 mmol/L (ref 22–32)
Calcium: 8.7 mg/dL — ABNORMAL LOW (ref 8.9–10.3)
Chloride: 95 mmol/L — ABNORMAL LOW (ref 98–111)
Creatinine, Ser: 5.57 mg/dL — ABNORMAL HIGH (ref 0.61–1.24)
GFR, Estimated: 10 mL/min — ABNORMAL LOW (ref >60)
Glucose, Bld: 136 mg/dL — ABNORMAL HIGH (ref 70–99)
Potassium: 4.2 mmol/L (ref 3.5–5.1)
Sodium: 137 mmol/L (ref 135–145)

## 2023-02-09 SURGERY — ECHOCARDIOGRAM, TRANSESOPHAGEAL
Anesthesia: Monitor Anesthesia Care

## 2023-02-09 SURGERY — RIGHT HEART CATH AND CORONARY/GRAFT ANGIOGRAPHY
Anesthesia: LOCAL

## 2023-02-09 MED ORDER — ONDANSETRON HCL 4 MG/2ML IJ SOLN: 4.0000 mg | Freq: Four times a day (QID) | INTRAMUSCULAR | Status: DC | PRN

## 2023-02-09 MED ORDER — EPHEDRINE SULFATE-NACL 50-0.9 MG/10ML-% IV SOSY
PREFILLED_SYRINGE | INTRAVENOUS | Status: DC | PRN
  Administered 2023-02-09: 5 mg via INTRAVENOUS

## 2023-02-09 MED ORDER — HYDRALAZINE HCL 20 MG/ML IJ SOLN: 10.0000 mg | INTRAMUSCULAR | Status: DC | PRN

## 2023-02-09 MED ORDER — ETOMIDATE 2 MG/ML IV SOLN
INTRAVENOUS | Status: DC | PRN
  Administered 2023-02-09: 4 mg via INTRAVENOUS

## 2023-02-09 MED ORDER — SODIUM CHLORIDE 0.9% FLUSH: 3.0000 mL | INTRAVENOUS | Status: DC | PRN

## 2023-02-09 MED ORDER — SODIUM CHLORIDE 0.9 % IV SOLN: INTRAVENOUS | Status: DC

## 2023-02-09 MED ORDER — PROPOFOL 500 MG/50ML IV EMUL
INTRAVENOUS | Status: DC | PRN
  Administered 2023-02-09: 75 ug/kg/min via INTRAVENOUS

## 2023-02-09 MED ORDER — LIDOCAINE HCL (PF) 1 % IJ SOLN
INTRAMUSCULAR | Status: DC | PRN
  Administered 2023-02-09: 8 mL

## 2023-02-09 MED ORDER — MIDAZOLAM HCL 2 MG/2ML IJ SOLN
INTRAMUSCULAR | Status: AC
  Filled 2023-02-09: qty 2

## 2023-02-09 MED ORDER — HEPARIN (PORCINE) IN NACL 1000-0.9 UT/500ML-% IV SOLN
INTRAVENOUS | Status: DC | PRN
  Administered 2023-02-09 (×2): 500 mL

## 2023-02-09 MED ORDER — IOHEXOL 350 MG/ML SOLN
INTRAVENOUS | Status: DC | PRN
  Administered 2023-02-09: 40 mL

## 2023-02-09 MED ORDER — LABETALOL HCL 5 MG/ML IV SOLN: 10.0000 mg | INTRAVENOUS | Status: DC | PRN

## 2023-02-09 MED ORDER — FENTANYL CITRATE (PF) 100 MCG/2ML IJ SOLN
INTRAMUSCULAR | Status: DC | PRN
  Administered 2023-02-09: 25 ug via INTRAVENOUS

## 2023-02-09 MED ORDER — ACETAMINOPHEN 325 MG PO TABS: 650.0000 mg | ORAL_TABLET | ORAL | Status: DC | PRN

## 2023-02-09 MED ORDER — VERAPAMIL HCL 2.5 MG/ML IV SOLN
INTRAVENOUS | Status: AC
  Filled 2023-02-09: qty 2

## 2023-02-09 MED ORDER — HEPARIN SODIUM (PORCINE) 1000 UNIT/ML IJ SOLN
INTRAMUSCULAR | Status: DC | PRN
  Administered 2023-02-09: 4500 [IU] via INTRAVENOUS

## 2023-02-09 MED ORDER — VERAPAMIL HCL 2.5 MG/ML IV SOLN
INTRAVENOUS | Status: DC | PRN
  Administered 2023-02-09: 5 mL via INTRA_ARTERIAL
  Administered 2023-02-09: 10 mL via INTRA_ARTERIAL

## 2023-02-09 MED ORDER — SODIUM CHLORIDE 0.9% FLUSH: 3.0000 mL | Freq: Two times a day (BID) | INTRAVENOUS | Status: DC

## 2023-02-09 MED ORDER — PHENYLEPHRINE 80 MCG/ML (10ML) SYRINGE FOR IV PUSH (FOR BLOOD PRESSURE SUPPORT)
PREFILLED_SYRINGE | INTRAVENOUS | Status: DC | PRN
  Administered 2023-02-09 (×3): 160 ug via INTRAVENOUS

## 2023-02-09 MED ORDER — LIDOCAINE HCL (PF) 1 % IJ SOLN
INTRAMUSCULAR | Status: AC
  Filled 2023-02-09: qty 30

## 2023-02-09 MED ORDER — FENTANYL CITRATE (PF) 100 MCG/2ML IJ SOLN
INTRAMUSCULAR | Status: AC
  Filled 2023-02-09: qty 2

## 2023-02-09 MED ORDER — MIDAZOLAM HCL 2 MG/2ML IJ SOLN
INTRAMUSCULAR | Status: DC | PRN
  Administered 2023-02-09: 1 mg via INTRAVENOUS

## 2023-02-09 MED ORDER — PROPOFOL 10 MG/ML IV BOLUS
INTRAVENOUS | Status: DC | PRN
  Administered 2023-02-09: 30 mg via INTRAVENOUS

## 2023-02-09 MED ORDER — PHENYLEPHRINE HCL-NACL 20-0.9 MG/250ML-% IV SOLN
INTRAVENOUS | Status: DC | PRN
  Administered 2023-02-09: 40 ug/min via INTRAVENOUS

## 2023-02-09 MED ORDER — LIDOCAINE HCL URETHRAL/MUCOSAL 2 % EX GEL
CUTANEOUS | Status: DC | PRN
  Administered 2023-02-09: 1 via TOPICAL

## 2023-02-09 MED ORDER — SODIUM CHLORIDE 0.9 % IV SOLN: 250.0000 mL | INTRAVENOUS | Status: DC | PRN

## 2023-02-09 MED ORDER — ASPIRIN 81 MG PO CHEW: 81.0000 mg | CHEWABLE_TABLET | Freq: Once | ORAL | Status: DC

## 2023-02-09 MED ORDER — HEPARIN SODIUM (PORCINE) 1000 UNIT/ML IJ SOLN
INTRAMUSCULAR | Status: AC
  Filled 2023-02-09: qty 10

## 2023-02-09 SURGICAL SUPPLY — 15 items
CATH BALLN WEDGE 5F 110CM (CATHETERS) IMPLANT
CATH INFINITI 4FR JL 4.0 (CATHETERS) IMPLANT
CATH INFINITI 5FR AL1 (CATHETERS) IMPLANT
CATH INFINITI 5FR MULTPACK ANG (CATHETERS) IMPLANT
DEVICE RAD COMP TR BAND LRG (VASCULAR PRODUCTS) IMPLANT
ELECT DEFIB PAD ADLT CADENCE (PAD) IMPLANT
GLIDESHEATH SLEND SS 6F .021 (SHEATH) IMPLANT
GUIDEWIRE .025 260CM (WIRE) IMPLANT
GUIDEWIRE INQWIRE 1.5J.035X260 (WIRE) IMPLANT
INQWIRE 1.5J .035X260CM (WIRE) ×1
PACK CARDIAC CATHETERIZATION (CUSTOM PROCEDURE TRAY) ×1 IMPLANT
SHEATH GLIDE SLENDER 4/5FR (SHEATH) IMPLANT
SHEATH PROBE COVER 6X72 (BAG) IMPLANT
TRANSDUCER W/STOPCOCK (MISCELLANEOUS) ×1 IMPLANT
WIRE HI TORQ VERSACORE-J 145CM (WIRE) IMPLANT

## 2023-02-09 NOTE — Discharge Instructions (Signed)

## 2023-02-09 NOTE — Interval H&P Note (Signed)
History and Physical Interval Note:  02/09/2023 8:21 AM  John [name]  has presented today for surgery, with the diagnosis of heart failure.  The various methods of treatment have been discussed with the patient and family. After consideration of risks, benefits and other options for treatment, the patient has consented to  Procedure(s): RIGHT/LEFT HEART CATH AND CORONARY ANGIOGRAPHY (N/A) as a surgical intervention.  The patient's history has been reviewed, patient examined, no change in status, stable for surgery.  I have reviewed the patient's chart and labs.  Questions were answered to the patient's satisfaction.     Fredis Malkiewicz

## 2023-02-09 NOTE — Anesthesia Postprocedure Evaluation (Signed)
Anesthesia Post Note  Patient: John [name]  Procedure(s) Performed: TRANSESOPHAGEAL ECHOCARDIOGRAM (TEE)     Patient location during evaluation: PACU Anesthesia Type: MAC Level of consciousness: awake and alert Pain management: pain level controlled Vital Signs Assessment: post-procedure vital signs reviewed and stable Respiratory status: spontaneous breathing, nonlabored ventilation and respiratory function stable Cardiovascular status: blood pressure returned to baseline and stable Postop Assessment: no apparent nausea or vomiting Anesthetic complications: no   No notable events documented.  Last Vitals:  Vitals:   02/09/23 0910 02/09/23 0916  BP: 113/67   Pulse: 81 81  Resp: 16 20  Temp:    SpO2: 90% 93%    Last Pain:  Vitals:   02/09/23 0824  TempSrc: Temporal  PainSc:                  [name]

## 2023-02-09 NOTE — Progress Notes (Signed)
  Echocardiogram Echocardiogram Transesophageal has been performed.  John [name] 02/09/2023, 8:34 AM

## 2023-02-09 NOTE — Interval H&P Note (Signed)
History and Physical Interval Note:  02/09/2023 7:26 AM  John [name]  has presented today for surgery, with the diagnosis of MITRAL REGURGITATION.  The various methods of treatment have been discussed with the patient and family. After consideration of risks, benefits and other options for treatment, the patient has consented to  Procedure(s): TRANSESOPHAGEAL ECHOCARDIOGRAM (TEE) (N/A) as a surgical intervention.  The patient's history has been reviewed, patient examined, no change in status, stable for surgery.  I have reviewed the patient's chart and labs.  Questions were answered to the patient's satisfaction.     Aniesa Boback

## 2023-02-09 NOTE — Transfer of Care (Signed)
Immediate Anesthesia Transfer of Care Note  Patient: John [name]  Procedure(s) Performed: TRANSESOPHAGEAL ECHOCARDIOGRAM (TEE)  Patient Location: PACU and Endoscopy Unit  Anesthesia Type:MAC  Level of Consciousness: awake, alert , and oriented  Airway & Oxygen Therapy: Patient Spontanous Breathing  Post-op Assessment: Report given to RN and Post -op Vital signs reviewed and stable  Post vital signs: Reviewed and stable  Last Vitals:  Vitals Value Taken Time  BP 114/62 02/09/23 0824  Temp    Pulse 82 02/09/23 0826  Resp 17 02/09/23 0826  SpO2 93 % 02/09/23 0826  Vitals shown include unvalidated device data.  Last Pain:  Vitals:   02/09/23 0824  TempSrc: Temporal  PainSc:          Complications: No notable events documented.

## 2023-02-09 NOTE — CV Procedure (Signed)
    TRANSESOPHAGEAL ECHOCARDIOGRAM   NAME:  John [name]   MRN: [id number] DOB:  1951/01/24   ADMIT DATE: 02/09/2023  INDICATIONS:  Mitral regurgitation  PROCEDURE:   Informed consent was obtained prior to the procedure. The risks, benefits and alternatives for the procedure were discussed and the patient comprehended these risks.  Risks include, but are not limited to, cough, sore throat, vomiting, nausea, somnolence, esophageal and stomach trauma or perforation, bleeding, low blood pressure, aspiration, pneumonia, infection, trauma to the teeth and death.    After a procedural time-out, the patient was sedated by the anesthesia service. Once an appropriate level of sedation was achieved, the transesophageal probe was inserted in the esophagus and stomach without difficulty and multiple views were obtained.    COMPLICATIONS:    There were no immediate complications.  FINDINGS:  LEFT VENTRICLE: EF = 20%. Severe global HK  RIGHT VENTRICLE: Severely HK  LEFT ATRIUM: Moderately dilated  LEFT ATRIAL APPENDAGE: No thrombus.   RIGHT ATRIUM: Moderately dilated  AORTIC VALVE:  Trileaflet. Trivial AI  MITRAL VALVE:    Normal. Moderate central MR (2-3+) with mild posterior leaflet tethering  TRICUSPID VALVE: Normal. Moderate TR  PULMONIC VALVE: Grossly normal. Trivial PI  INTERATRIAL SEPTUM: No PFO or ASD.  PERICARDIUM: Small effusion  DESCENDING AORTA: Severe plaque    Benay Spice 8:18 AM

## 2023-02-09 NOTE — Progress Notes (Signed)
Pt transported to cath lab.  

## 2023-02-09 NOTE — Anesthesia Preprocedure Evaluation (Signed)
Anesthesia Evaluation  Patient identified by MRN, date of birth, ID band Patient awake    Reviewed: Allergy & Precautions, H&P , NPO status , Patient's Chart, lab work & pertinent test results, reviewed documented beta blocker date and time   Airway Mallampati: II  TM Distance: >3 FB Neck ROM: Full    Dental no notable dental hx. (+) Teeth Intact, Dental Advisory Given   Pulmonary shortness of breath, former smoker   Pulmonary exam normal        Cardiovascular hypertension, Pt. on medications and Pt. on home beta blockers + CAD, + Past MI, + Cardiac Stents and +CHF  + Valvular Problems/Murmurs AS and MR  Rhythm:Regular + Systolic murmurs and + Diastolic murmurs Echo 1. Left ventricular ejection fraction, by estimation, is 40 to 45%. The left ventricle has mildly decreased function. The left ventricle demonstrates regional wall motion abnormalities (see scoring diagram/findings for description). There is mild concentric left ventricular hypertrophy. Left ventricular diastolic function could not be evaluated.  2. Right ventricular systolic function is normal. The right ventricular size is normal.  3. No left atrial/left atrial appendage thrombus was detected.  4. The mitral valve is normal in structure and function. Mild mitral valve regurgitation. No evidence of mitral stenosis.  5. The aortic valve is normal in structure and function. Aortic valve regurgitation is not visualized. Mild aortic valve sclerosis is present, with no evidence of aortic valve stenosis.  6. The inferior vena cava is normal in size with greater than 50% respiratory variability, suggesting right atrial pressure of 3 mmHg.     Neuro/Psych negative neurological ROS  negative psych ROS   GI/Hepatic negative GI ROS, Neg liver ROS,,,  Endo/Other  diabetes, Insulin Dependent, Oral Hypoglycemic Agents    Renal/GU ESRF and DialysisRenal disease      Musculoskeletal  (+) Arthritis , Osteoarthritis,    Abdominal   Peds  Hematology negative hematology ROS (+)   Anesthesia Other Findings   Reproductive/Obstetrics                             Anesthesia Physical Anesthesia Plan  ASA: 4  Anesthesia Plan: MAC   Post-op Pain Management: Minimal or no pain anticipated   Induction: Intravenous  PONV Risk Score and Plan: 1 and Ondansetron and Treatment may vary due to age or medical condition  Airway Management Planned: Nasal Cannula  Additional Equipment:   Intra-op Plan:   Post-operative Plan:   Informed Consent: I have reviewed the patients History and Physical, chart, labs and discussed the procedure including the risks, benefits and alternatives for the proposed anesthesia with the patient or authorized representative who has indicated his/her understanding and acceptance.     Dental advisory given  Plan Discussed with: CRNA  Anesthesia Plan Comments:         Anesthesia Quick Evaluation

## 2023-02-09 NOTE — H&P (Signed)
ADVANCED HF CLINIC NOTE   Primary Care: Rhea Bleacher, NP Primary Cardiologist: Jenne Campus, MD HF Cardiologist: Dr. Haroldine Laws   HPI:  John [name] is a 72 y.o.. male history of coronary artery disease with prior PCI to the RCA, DMII, HTN, PAD s/p aortofemoral bypass and ESRD on HD,     Presented 81' w/ CP and elevated Hstop: Cath revealed severe multivessel disease. Underwent a four-vessel CABG 5/20 with LIMA to LAD, SVG to diagonal, SVG to OM, and SVG to PDA.  Had endoscopic harvesting of the greater saphenous vein from his right leg. Postoperatively he had thrombocytopenia on Lovenox and this was dc'd, also found to have postop CHF and was diuresed aggressively. CXR showed some loculated large fluid in the left pleura, also echo being done under some suspicion for endocarditis of the aortic valve. TEE no active endocarditis. He did have some calcification of the leaflet of the aortic valve.  Found to have chronic pleural effusion 3/21. Had video bronchoscopy done as well as left VATS with drainage of empyema as well as decortication and intercostal nerve block.    Had chronic renal failure progressively worse until October 21' when he started HD.  10/22 had echo d/t DOE. Had worsening of CAD, his EF was 35 to 40%.  Stress test has been done to rule out ischemia as potential cause of his worsening EF, however did not show any significant ischemia. GDMT started but difficulty managing 2/2 hypotension. Recent echo 9/23 EF down to 20 to 25%. mild LVH. RV size is normal. Severely elevated pulmonary artery systolic pressure. LA mildly elevated, RA mod dilated. Mild MR, Mod TR. Referred to AHF clinic for further management with worsening HFrEF.   PYP 11/23 negative   Dr. Haroldine Laws first saw him 9/23, and he was markedly volume overloaded. Dr. Haroldine Laws d/w Dr. [name] and the plan was to pull more fluid with HD and decrease his dry weight. They were able to get him down from 208 to  195 lbs.  Follow up 1/24,  REDs 41%, volume remained up but improving. NYHA IIIb.  Seen in clinic several weeks ago still with NYHA III-IIIb despite improved volume management with HD. Also having some CP. Plan for R/L cath today to assess for mTEER  Echo today 01/04/23 EF 20-25% grade II DD, RV moderately down, severe MR, moderate TR  Cardiac studies: - Echo 9/23 EF down to 20 to 25%. mild LVH. RV size is normal. Severely elevated pulmonary artery systolic pressure. LA mildly elevated, RA mod dilated. Mild MR, Mod TR.   - Echo 3/23 EF 30-35%  - Echo 10/22 EF 35-40%  - Lexiscan 2/22 normal  - Echo 8/21, TEE 2/21, Echo 9/21 EF 40-45%  - Echo 5/20 EF 50-55%  - LHC 2020: severe multivessel disease  - Echo 10/18 EF 55-60%  - LHC 2020: Mid Cx lesion is 90% stenosed., Ost Cx to Prox Cx lesion is 80% stenosed. Prox LAD to Mid LAD lesion is 75% stenosed. This lesion is hemodynamically significant and had a markedly positive DFR 0.71. Ost 1st Diag lesion is 70% stenosed.Prox RCA to Mid RCA lesion is 75% stenosed.Dist RCA-1 lesion is 40% stenosed. Prior stent with mild instent restenosis.Dist RCA-2 lesion is 80% stenosed.LV end diastolic pressure is normal.There is no aortic valve stenosis.   Past Medical History:  Diagnosis Date   Allergy, unspecified, initial encounter 08/30/2020   Anaphylactic shock, unspecified, initial encounter 08/30/2020   Anemia in chronic kidney disease 08/30/2020  Arthritis    Back pain 09/07/2017   Carotid artery stenosis    [id number] LICA, [id number] RICA [id number] [place]   Chest pain 03/24/2019   Chronic infective endocarditis    Chronic kidney disease (CKD), stage IV (severe) ([place]) 12/01/2018   CKD (chronic kidney disease), stage V ([place]) 03/24/2019   Coronary artery disease    Dependence on renal dialysis ([place]) 08/31/2020   Diabetes mellitus without complication (HCC)    Disorder of phosphorus metabolism, unspecified 08/31/2020   DM (diabetes mellitus), type 2 with renal  complications ([place]) [id number]   Dyspnea    Encounter for screening for respiratory tuberculosis 08/30/2020   End stage renal disease ([place]) 09/07/2017   ESRD (end stage renal disease) ([place]) 08/27/2020   Essential (primary) hypertension 09/07/2017   Fever    History of endocarditis 01/09/2020   Hypercalcemia 10/08/2020   Hyperlipidemia, unspecified 09/07/2017   Hypertension    Hypokalemia 11/13/2021   Iron deficiency anemia, unspecified 08/30/2020   Low back pain 09/07/2017   MI (myocardial infarction) ([place])    MSSA bacteremia 09/10/2017   Non-ST elevation (NSTEMI) myocardial infarction ([place]) 01/09/2020   NSTEMI (non-ST elevated myocardial infarction) ([place]) 03/24/2019   Other specified arthritis, other site 08/31/2020   Pain, unspecified 08/30/2020   Personal history of other diseases of the circulatory system 08/31/2020   Presence of aortocoronary bypass graft 08/31/2020   Pruritus, unspecified 08/30/2020   Renal disorder    Restless legs syndrome 09/06/2020   S/P CABG x 4 04/03/2019   S/P Video Bronchoscopy, Right VATS with drainage of pleural effusion, decortication, intercostal nerve block 02/20/2020   Secondary hyperparathyroidism of renal origin ([place]) 08/30/2020   Thoracic aortic aneurysm (TAA) (HCC)    4.3 cm ascending TAA 02/02/20 CT   Type 2 diabetes mellitus with diabetic nephropathy ([place]) 08/30/2020   Current Facility-Administered Medications  Medication Dose Route Frequency Provider Last Rate Last Admin   0.9 %  sodium chloride infusion   Intravenous Continuous Reegan Bouffard, Shaune Pascal, MD 20 mL/hr at 02/09/23 0704 New Bag at 02/09/23 0704   0.9 %  sodium chloride infusion  250 mL Intravenous PRN Berneice Zettlemoyer, Shaune Pascal, MD       0.9 %  sodium chloride infusion   Intravenous Continuous Celene Pippins, Shaune Pascal, MD       sodium chloride flush (NS) 0.9 % injection 3 mL  3 mL Intravenous Q12H Dilyn Osoria, Shaune Pascal, MD       sodium chloride flush (NS) 0.9 % injection 3 mL  3 mL Intravenous PRN Arien Morine,  Shaune Pascal, MD       Allergies  Allergen Reactions   Penicillins Rash and Other (See Comments)    Did it involve swelling of the face/tongue/throat, SOB, or low BP? No Did it involve sudden or severe rash/hives, skin peeling, or any reaction on the inside of your mouth or nose? Yes Did you need to seek medical attention at a hospital or doctor's office? Yes When did it last happen?      Childhood allergy If all above answers are "NO", may proceed with cephalosporin use.    Social History   Socioeconomic History   Marital status: Married    Spouse name: Not on file   Number of children: Not on file   Years of education: Not on file   Highest education level: Not on file  Occupational History   Not on file  Tobacco Use   Smoking status: Former    Packs/day: 2.00    Years:  30.00    Additional pack years: 0.00    Total pack years: 60.00    Types: Cigarettes    Start date: 32    Quit date: 1998    Years since quitting: 26.2   Smokeless tobacco: Never  Vaping Use   Vaping Use: Never used  Substance and Sexual Activity   Alcohol use: Yes    Comment: Occasionally.   Drug use: Never   Sexual activity: Not on file  Other Topics Concern   Not on file  Social History Narrative   Not on file   Social Determinants of Health   Financial Resource Strain: Not on file  Food Insecurity: Not on file  Transportation Needs: Not on file  Physical Activity: Not on file  Stress: Not on file  Social Connections: Not on file  Intimate Partner Violence: Not on file   Family History  Problem Relation Age of Onset   Cancer Maternal Grandmother    BP 121/74   Pulse 84   Temp (!) 97.3 F (36.3 C) (Temporal)   Resp 18   Ht 6\' 2"  (1.88 m)   Wt 92.5 kg   SpO2 95%   BMI 26.19 kg/m   Wt Readings from Last 3 Encounters:  02/09/23 92.5 kg  01/04/23 93.3 kg  12/04/22 93.9 kg   PHYSICAL EXAM: General:  Well appearing. No resp difficulty HEENT: normal Neck: supple. no JVD.  Carotids 2+ bilat; no bruits. No lymphadenopathy or thryomegaly appreciated. Cor: PMI nondisplaced. Regular rate & rhythm. 2/6 MR Lungs: clear Abdomen: soft, nontender, nondistended. No hepatosplenomegaly. No bruits or masses. Good bowel sounds. Extremities: no cyanosis, clubbing, rash, edema  RUE AVF Neuro: alert & orientedx3, cranial nerves grossly intact. moves all 4 extremities w/o difficulty. Affect pleasant   ASSESSMENT & PLAN: Chronic systolic heart failure - Echo 10/18 EF 55-60% - LHC 2020: severe multivessel disease, underwent CABG x4 - Echo 8/21, TEE 2/21, Echo 9/21 EF 40-45% - Lexiscan 2/22 normal - Echo 10/22 EF 35-40% - EF 3/23 EF 30-35% - Echo 9/23 EF down to 20 to 25%. mild LVH. RV size is normal. Severely elevated pulmonary artery systolic pressure. LA mildly elevated, RA mod dilated. Mild MR, Mod TR.  - Echo today 01/04/23 showed EF EF 20-25% grade II DD, RV moderately down, RVSP 70 mmHg, severe MR, moderate TR - NYHA III-IIIB, volume looks good today, REDs down to 34% today. Unable to start GDMT w/ ESRD requiring midodrine for BP support. - PYP negative - Continue pre-HD midodrine 10 mg. Dr. Haroldine Laws spoke with Dr. [name]. They will continue to work on dropping his dry weight. - Not candidate for VAD/transplant with age and ESRD.  - Volume better but he is still struggling with NYHA III symptoms. - Plan R/L cath today to assess for mTEER  2. MR - Severe on echo  - Plab TEE to evaluate valve, ? Candidacy for MitraClip.  3. CAD, ICM s/p CABG x4 - Previous smoker, quit in 48' - LHC 2020: severe multivessel disease, s/p CABG x4 - Continue ASA and statin - Had several episodes of CP 2 weeks ago but now resolved - Cath today  4. ESRD on HD - T/Th/Sat schedule. - Plan as above.  5. HTN - resolved. Now requiring midodrine - Has been getting hypotensive with HD.  6. DM II - Managed by PCP. - Hgb A1c last 6.3   7. SOB/Persistent cough - Follows w/ Dr.  Lamonte Sakai. - Suspect 2/2 volume overload.  Plan for  TEE and R/LHC today to assess candidacy for mTEER  Glori Bickers, MD  7:22 AM

## 2023-02-10 ENCOUNTER — Encounter (HOSPITAL_COMMUNITY): Payer: Self-pay | Admitting: Internal Medicine

## 2023-02-10 LAB — ECHO TEE
Est EF: <20
MV M vel: 4.96 m/s
MV Peak grad: 98.4 mmHg
Radius: 0.6 cm

## 2023-02-19 ENCOUNTER — Encounter (HOSPITAL_COMMUNITY): Payer: Self-pay

## 2023-02-19 ENCOUNTER — Other Ambulatory Visit: Payer: Self-pay

## 2023-02-19 ENCOUNTER — Emergency Department (HOSPITAL_COMMUNITY): Payer: Medicare HMO

## 2023-02-19 ENCOUNTER — Inpatient Hospital Stay (HOSPITAL_COMMUNITY)
Admission: EM | Admit: 2023-02-19 | Discharge: 2023-02-24 | DRG: 193 | Disposition: A | Discharge status: Home / Self Care | Payer: Medicare HMO | Attending: Internal Medicine | Admitting: Internal Medicine

## 2023-02-19 DIAGNOSIS — J189 Pneumonia, unspecified organism: Secondary | ICD-10-CM | POA: Diagnosis not present

## 2023-02-19 DIAGNOSIS — I6523 Occlusion and stenosis of bilateral carotid arteries: Secondary | ICD-10-CM

## 2023-02-19 DIAGNOSIS — B9729 Other coronavirus as the cause of diseases classified elsewhere: Secondary | ICD-10-CM | POA: Diagnosis present

## 2023-02-19 DIAGNOSIS — N186 End stage renal disease: Secondary | ICD-10-CM | POA: Diagnosis not present

## 2023-02-19 DIAGNOSIS — Z09 Encounter for follow-up examination after completed treatment for conditions other than malignant neoplasm: Secondary | ICD-10-CM

## 2023-02-19 DIAGNOSIS — K828 Other specified diseases of gallbladder: Secondary | ICD-10-CM | POA: Diagnosis present

## 2023-02-19 DIAGNOSIS — N189 Chronic kidney disease, unspecified: Secondary | ICD-10-CM | POA: Diagnosis present

## 2023-02-19 DIAGNOSIS — R109 Unspecified abdominal pain: Secondary | ICD-10-CM

## 2023-02-19 DIAGNOSIS — I2510 Atherosclerotic heart disease of native coronary artery without angina pectoris: Secondary | ICD-10-CM | POA: Diagnosis present

## 2023-02-19 DIAGNOSIS — R197 Diarrhea, unspecified: Secondary | ICD-10-CM | POA: Diagnosis not present

## 2023-02-19 DIAGNOSIS — Z87891 Personal history of nicotine dependence: Secondary | ICD-10-CM

## 2023-02-19 DIAGNOSIS — K469 Unspecified abdominal hernia without obstruction or gangrene: Secondary | ICD-10-CM | POA: Diagnosis present

## 2023-02-19 DIAGNOSIS — E1151 Type 2 diabetes mellitus with diabetic peripheral angiopathy without gangrene: Secondary | ICD-10-CM | POA: Diagnosis present

## 2023-02-19 DIAGNOSIS — R5381 Other malaise: Secondary | ICD-10-CM | POA: Diagnosis present

## 2023-02-19 DIAGNOSIS — D631 Anemia in chronic kidney disease: Secondary | ICD-10-CM | POA: Diagnosis present

## 2023-02-19 DIAGNOSIS — E782 Mixed hyperlipidemia: Secondary | ICD-10-CM

## 2023-02-19 DIAGNOSIS — R7989 Other specified abnormal findings of blood chemistry: Secondary | ICD-10-CM | POA: Diagnosis present

## 2023-02-19 DIAGNOSIS — Z992 Dependence on renal dialysis: Secondary | ICD-10-CM

## 2023-02-19 DIAGNOSIS — Z8616 Personal history of COVID-19: Secondary | ICD-10-CM

## 2023-02-19 DIAGNOSIS — N400 Enlarged prostate without lower urinary tract symptoms: Secondary | ICD-10-CM | POA: Diagnosis present

## 2023-02-19 DIAGNOSIS — I252 Old myocardial infarction: Secondary | ICD-10-CM

## 2023-02-19 DIAGNOSIS — J129 Viral pneumonia, unspecified: Secondary | ICD-10-CM | POA: Diagnosis not present

## 2023-02-19 DIAGNOSIS — Z8679 Personal history of other diseases of the circulatory system: Secondary | ICD-10-CM

## 2023-02-19 DIAGNOSIS — Z79899 Other long term (current) drug therapy: Secondary | ICD-10-CM

## 2023-02-19 DIAGNOSIS — N2581 Secondary hyperparathyroidism of renal origin: Secondary | ICD-10-CM | POA: Diagnosis present

## 2023-02-19 DIAGNOSIS — I472 Ventricular tachycardia, unspecified: Secondary | ICD-10-CM | POA: Diagnosis not present

## 2023-02-19 DIAGNOSIS — Z951 Presence of aortocoronary bypass graft: Secondary | ICD-10-CM

## 2023-02-19 DIAGNOSIS — I5082 Biventricular heart failure: Secondary | ICD-10-CM | POA: Diagnosis present

## 2023-02-19 DIAGNOSIS — I340 Nonrheumatic mitral (valve) insufficiency: Secondary | ICD-10-CM | POA: Diagnosis present

## 2023-02-19 DIAGNOSIS — I6529 Occlusion and stenosis of unspecified carotid artery: Secondary | ICD-10-CM | POA: Diagnosis present

## 2023-02-19 DIAGNOSIS — I9589 Other hypotension: Secondary | ICD-10-CM | POA: Diagnosis present

## 2023-02-19 DIAGNOSIS — J9601 Acute respiratory failure with hypoxia: Secondary | ICD-10-CM | POA: Diagnosis not present

## 2023-02-19 DIAGNOSIS — I10 Essential (primary) hypertension: Secondary | ICD-10-CM | POA: Diagnosis present

## 2023-02-19 DIAGNOSIS — J181 Lobar pneumonia, unspecified organism: Secondary | ICD-10-CM | POA: Diagnosis present

## 2023-02-19 DIAGNOSIS — Z7985 Long-term (current) use of injectable non-insulin antidiabetic drugs: Secondary | ICD-10-CM

## 2023-02-19 DIAGNOSIS — E860 Dehydration: Secondary | ICD-10-CM | POA: Diagnosis present

## 2023-02-19 DIAGNOSIS — I132 Hypertensive heart and chronic kidney disease with heart failure and with stage 5 chronic kidney disease, or end stage renal disease: Secondary | ICD-10-CM | POA: Diagnosis present

## 2023-02-19 DIAGNOSIS — E1122 Type 2 diabetes mellitus with diabetic chronic kidney disease: Secondary | ICD-10-CM | POA: Diagnosis present

## 2023-02-19 DIAGNOSIS — Z880 Allergy status to penicillin: Secondary | ICD-10-CM

## 2023-02-19 DIAGNOSIS — E1121 Type 2 diabetes mellitus with diabetic nephropathy: Secondary | ICD-10-CM | POA: Diagnosis present

## 2023-02-19 DIAGNOSIS — I5023 Acute on chronic systolic (congestive) heart failure: Secondary | ICD-10-CM | POA: Insufficient documentation

## 2023-02-19 DIAGNOSIS — Z7982 Long term (current) use of aspirin: Secondary | ICD-10-CM

## 2023-02-19 DIAGNOSIS — Z1152 Encounter for screening for COVID-19: Secondary | ICD-10-CM

## 2023-02-19 DIAGNOSIS — E785 Hyperlipidemia, unspecified: Secondary | ICD-10-CM | POA: Diagnosis present

## 2023-02-19 DIAGNOSIS — Z7984 Long term (current) use of oral hypoglycemic drugs: Secondary | ICD-10-CM

## 2023-02-19 DIAGNOSIS — G2581 Restless legs syndrome: Secondary | ICD-10-CM | POA: Diagnosis present

## 2023-02-19 LAB — BASIC METABOLIC PANEL
Anion gap: 19 — ABNORMAL HIGH (ref 5–15)
BUN: 65 mg/dL — ABNORMAL HIGH (ref 8–23)
CO2: 25 mmol/L (ref 22–32)
Calcium: 8.2 mg/dL — ABNORMAL LOW (ref 8.9–10.3)
Chloride: 91 mmol/L — ABNORMAL LOW (ref 98–111)
Creatinine, Ser: 10.46 mg/dL — ABNORMAL HIGH (ref 0.61–1.24)
GFR, Estimated: 5 mL/min — ABNORMAL LOW (ref >60)
Glucose, Bld: 183 mg/dL — ABNORMAL HIGH (ref 70–99)
Potassium: 4.9 mmol/L (ref 3.5–5.1)
Sodium: 135 mmol/L (ref 135–145)

## 2023-02-19 LAB — HEPATITIS B SURFACE ANTIGEN: Hepatitis B Surface Ag: NONREACTIVE

## 2023-02-19 LAB — CBC
HCT: 33.3 % — ABNORMAL LOW (ref 39.0–52.0)
Hemoglobin: 10.7 g/dL — ABNORMAL LOW (ref 13.0–17.0)
MCH: 32.9 pg (ref 26.0–34.0)
MCHC: 32.1 g/dL (ref 30.0–36.0)
MCV: 102.5 fL — ABNORMAL HIGH (ref 80.0–100.0)
Platelets: 128 10*3/uL — ABNORMAL LOW (ref 150–400)
RBC: 3.25 MIL/uL — ABNORMAL LOW (ref 4.22–5.81)
RDW: 14.5 % (ref 11.5–15.5)
WBC: 10.1 10*3/uL (ref 4.0–10.5)
nRBC: 0 % (ref 0.0–0.2)

## 2023-02-19 LAB — MAGNESIUM: Magnesium: 2.1 mg/dL (ref 1.7–2.4)

## 2023-02-19 LAB — TROPONIN I (HIGH SENSITIVITY)
Troponin I (High Sensitivity): 44 ng/L — ABNORMAL HIGH (ref <18)
Troponin I (High Sensitivity): 40 ng/L — ABNORMAL HIGH (ref <18)

## 2023-02-19 LAB — LACTIC ACID, PLASMA
Lactic Acid, Venous: 1.5 mmol/L (ref 0.5–1.9)
Lactic Acid, Venous: 1.7 mmol/L (ref 0.5–1.9)

## 2023-02-19 LAB — HIV ANTIBODY (ROUTINE TESTING W REFLEX): HIV Screen 4th Generation wRfx: NONREACTIVE

## 2023-02-19 MED ORDER — GUAIFENESIN 100 MG/5ML PO LIQD
5.0000 mL | ORAL | Status: DC | PRN
  Administered 2023-02-19 – 2023-02-20 (×3): 5 mL via ORAL
  Filled 2023-02-19 (×3): qty 5

## 2023-02-19 MED ORDER — SODIUM CHLORIDE 0.9 % IV BOLUS
500.0000 mL | Freq: Once | INTRAVENOUS | Status: AC
  Administered 2023-02-19: 500 mL via INTRAVENOUS

## 2023-02-19 MED ORDER — POLYETHYLENE GLYCOL 3350 17 G PO PACK: 17.0000 g | PACK | Freq: Every day | ORAL | Status: DC | PRN

## 2023-02-19 MED ORDER — SODIUM CHLORIDE 0.9% FLUSH
3.0000 mL | Freq: Two times a day (BID) | INTRAVENOUS | Status: DC
  Administered 2023-02-19 – 2023-02-24 (×10): 3 mL via INTRAVENOUS

## 2023-02-19 MED ORDER — GABAPENTIN 100 MG PO CAPS
200.0000 mg | ORAL_CAPSULE | Freq: Every day | ORAL | Status: DC
  Administered 2023-02-19 – 2023-02-23 (×5): 200 mg via ORAL
  Filled 2023-02-19 (×5): qty 2

## 2023-02-19 MED ORDER — ATORVASTATIN CALCIUM 40 MG PO TABS
40.0000 mg | ORAL_TABLET | Freq: Every day | ORAL | Status: DC
  Administered 2023-02-19 – 2023-02-23 (×5): 40 mg via ORAL
  Filled 2023-02-19 (×5): qty 1

## 2023-02-19 MED ORDER — HYDROCODONE BIT-HOMATROP MBR 5-1.5 MG/5ML PO SOLN
5.0000 mL | Freq: Once | ORAL | Status: AC
  Administered 2023-02-19: 5 mL via ORAL
  Filled 2023-02-19: qty 5

## 2023-02-19 MED ORDER — SODIUM CHLORIDE 0.9 % IV SOLN
1.0000 g | Freq: Once | INTRAVENOUS | Status: AC
  Administered 2023-02-19: 1 g via INTRAVENOUS
  Filled 2023-02-19: qty 10

## 2023-02-19 MED ORDER — ROPINIROLE HCL 0.5 MG PO TABS
0.5000 mg | ORAL_TABLET | Freq: Every day | ORAL | Status: DC
  Administered 2023-02-19 – 2023-02-23 (×5): 0.5 mg via ORAL
  Filled 2023-02-19 (×5): qty 1

## 2023-02-19 MED ORDER — ROPINIROLE HCL 0.5 MG PO TABS
0.5000 mg | ORAL_TABLET | ORAL | Status: DC
  Administered 2023-02-20 – 2023-02-23 (×2): 0.5 mg via ORAL
  Filled 2023-02-19 (×3): qty 1

## 2023-02-19 MED ORDER — SODIUM CHLORIDE 0.9 % IV SOLN
500.0000 mg | INTRAVENOUS | Status: AC
  Administered 2023-02-20 – 2023-02-22 (×3): 500 mg via INTRAVENOUS
  Filled 2023-02-19 (×5): qty 5

## 2023-02-19 MED ORDER — GABAPENTIN 100 MG PO CAPS: 100.0000 mg | ORAL_CAPSULE | Freq: Once | ORAL | Status: DC

## 2023-02-19 MED ORDER — ASPIRIN 81 MG PO TBEC
81.0000 mg | DELAYED_RELEASE_TABLET | Freq: Every day | ORAL | Status: DC
  Administered 2023-02-20 – 2023-02-24 (×5): 81 mg via ORAL
  Filled 2023-02-19 (×5): qty 1

## 2023-02-19 MED ORDER — ZOLPIDEM TARTRATE 5 MG PO TABS: 10.0000 mg | ORAL_TABLET | Freq: Once | ORAL | Status: DC

## 2023-02-19 MED ORDER — SODIUM CHLORIDE 0.9 % IV SOLN
2.0000 g | INTRAVENOUS | Status: AC
  Administered 2023-02-20 – 2023-02-22 (×3): 2 g via INTRAVENOUS
  Filled 2023-02-19 (×4): qty 20

## 2023-02-19 MED ORDER — ACETAMINOPHEN 325 MG PO TABS
650.0000 mg | ORAL_TABLET | Freq: Four times a day (QID) | ORAL | Status: DC | PRN
  Administered 2023-02-19 – 2023-02-22 (×5): 650 mg via ORAL
  Filled 2023-02-19 (×5): qty 2

## 2023-02-19 MED ORDER — RENA-VITE PO TABS
1.0000 | ORAL_TABLET | Freq: Every day | ORAL | Status: DC
  Administered 2023-02-19 – 2023-02-23 (×5): 1 via ORAL
  Filled 2023-02-19 (×6): qty 1

## 2023-02-19 MED ORDER — SODIUM CHLORIDE 0.9 % IV SOLN
500.0000 mg | Freq: Once | INTRAVENOUS | Status: AC
  Administered 2023-02-19: 500 mg via INTRAVENOUS
  Filled 2023-02-19: qty 5

## 2023-02-19 MED ORDER — SEVELAMER CARBONATE 800 MG PO TABS
1600.0000 mg | ORAL_TABLET | Freq: Three times a day (TID) | ORAL | Status: DC
  Administered 2023-02-20 – 2023-02-24 (×10): 1600 mg via ORAL
  Filled 2023-02-19 (×10): qty 2

## 2023-02-19 MED ORDER — ROPINIROLE HCL 1 MG PO TABS: 0.5000 mg | ORAL_TABLET | ORAL | Status: DC

## 2023-02-19 MED ORDER — HEPARIN SODIUM (PORCINE) 5000 UNIT/ML IJ SOLN
5000.0000 [IU] | Freq: Three times a day (TID) | INTRAMUSCULAR | Status: DC
  Administered 2023-02-19 – 2023-02-24 (×14): 5000 [IU] via SUBCUTANEOUS
  Filled 2023-02-19 (×14): qty 1

## 2023-02-19 MED ORDER — ACETAMINOPHEN 500 MG PO TABS
1000.0000 mg | ORAL_TABLET | Freq: Once | ORAL | Status: AC
  Administered 2023-02-19: 1000 mg via ORAL
  Filled 2023-02-19: qty 2

## 2023-02-19 MED ORDER — ZOLPIDEM TARTRATE 5 MG PO TABS
5.0000 mg | ORAL_TABLET | Freq: Every day | ORAL | Status: DC
  Administered 2023-02-19 – 2023-02-23 (×5): 5 mg via ORAL
  Filled 2023-02-19 (×5): qty 1

## 2023-02-19 MED ORDER — MIDODRINE HCL 5 MG PO TABS
10.0000 mg | ORAL_TABLET | ORAL | Status: DC
  Administered 2023-02-20 – 2023-02-23 (×2): 10 mg via ORAL
  Filled 2023-02-19 (×5): qty 2

## 2023-02-19 MED ORDER — ACETAMINOPHEN 650 MG RE SUPP: 650.0000 mg | Freq: Four times a day (QID) | RECTAL | Status: DC | PRN

## 2023-02-19 MED ORDER — PANTOPRAZOLE SODIUM 40 MG PO TBEC
40.0000 mg | DELAYED_RELEASE_TABLET | Freq: Every day | ORAL | Status: DC
  Administered 2023-02-20 – 2023-02-24 (×5): 40 mg via ORAL
  Filled 2023-02-19 (×5): qty 1

## 2023-02-19 NOTE — ED Provider Notes (Signed)
[facility] Provider Note  CSN: [id number] Arrival date & time: 02/19/23 1235  Chief Complaint(s) SOB, Weakness, and Fever  HPI John [name] is a 72 y.o. male history of end-stage renal disease, diabetes, CHF presenting to the emergency department with cough.  Patient reports cough for the past week.  He reports it is productive of white phlegm.  Also reports mild shortness of breath.  He reports some left-sided chest pain with coughing but no pleuritic pain or pain with exertion.  He reports fevers and chills.  He said he saw his primary doctor who gave him a shot of steroids few days ago but his symptoms are persistent.  No runny nose or sore throat.  No abdominal pain or back pain.  No nausea or vomiting.  No diarrhea.   Past Medical History Past Medical History:  Diagnosis Date   Allergy, unspecified, initial encounter 08/30/2020   Anaphylactic shock, unspecified, initial encounter 08/30/2020   Anemia in chronic kidney disease 08/30/2020   Arthritis    Back pain 09/07/2017   Carotid artery stenosis    [id number] LICA, [id number] RICA [id number] [place]   Chest pain 03/24/2019   Chronic infective endocarditis    Chronic kidney disease (CKD), stage IV (severe) ([place]) 12/01/2018   CKD (chronic kidney disease), stage V ([place]) 03/24/2019   Coronary artery disease    Dependence on renal dialysis ([place]) 08/31/2020   Diabetes mellitus without complication (HCC)    Disorder of phosphorus metabolism, unspecified 08/31/2020   DM (diabetes mellitus), type 2 with renal complications ([place]) [id number]   Dyspnea    Encounter for screening for respiratory tuberculosis 08/30/2020   End stage renal disease ([place]) 09/07/2017   ESRD (end stage renal disease) ([place]) 08/27/2020   Essential (primary) hypertension 09/07/2017   Fever    History of endocarditis 01/09/2020   Hypercalcemia 10/08/2020   Hyperlipidemia, unspecified 09/07/2017   Hypertension    Hypokalemia 11/13/2021    Iron deficiency anemia, unspecified 08/30/2020   Low back pain 09/07/2017   MI (myocardial infarction) ([place])    MSSA bacteremia 09/10/2017   Non-ST elevation (NSTEMI) myocardial infarction ([place]) 01/09/2020   NSTEMI (non-ST elevated myocardial infarction) ([place]) 03/24/2019   Other specified arthritis, other site 08/31/2020   Pain, unspecified 08/30/2020   Personal history of other diseases of the circulatory system 08/31/2020   Presence of aortocoronary bypass graft 08/31/2020   Pruritus, unspecified 08/30/2020   Renal disorder    Restless legs syndrome 09/06/2020   S/P CABG x 4 04/03/2019   S/P Video Bronchoscopy, Right VATS with drainage of pleural effusion, decortication, intercostal nerve block 02/20/2020   Secondary hyperparathyroidism of renal origin ([place]) 08/30/2020   Thoracic aortic aneurysm (TAA) (HCC)    4.3 cm ascending TAA 02/02/20 CT   Type 2 diabetes mellitus with diabetic nephropathy ([place]) 08/30/2020   Patient Active Problem List   Diagnosis Date Noted   CAP (community acquired pneumonia) 02/19/2023   Chronic cough 07/02/2022   Cardiomyopathy ([place]) 01/20/2022   Personal history of COVID-19 07/18/2021   Renal disorder    MI (myocardial infarction) ([place])    Hypertension    Dyspnea    Carotid artery stenosis    Restless legs syndrome 09/06/2020   Thoracic aortic aneurysm without rupture ([place]) 08/31/2020   Unspecified atherosclerosis 08/31/2020   Other specified arthritis, other site 08/31/2020   Disorder of phosphorus metabolism, unspecified 08/31/2020   Occlusion and stenosis of unspecified carotid artery 08/31/2020   Personal history  of other diseases of the circulatory system 08/31/2020   Presence of aortocoronary bypass graft 08/31/2020   Type 2 diabetes mellitus with diabetic nephropathy ([place]) 08/30/2020   Allergy, unspecified, initial encounter 08/30/2020   Anaphylactic shock, unspecified, initial encounter 08/30/2020   Anemia in chronic kidney disease 08/30/2020    Encounter for screening for respiratory tuberculosis 08/30/2020   Iron deficiency anemia, unspecified 08/30/2020   Pain, unspecified 08/30/2020   Pruritus, unspecified 08/30/2020   Secondary hyperparathyroidism of renal origin ([place]) 08/30/2020   ESRD (end stage renal disease) ([place]) 08/27/2020   S/P Video Bronchoscopy, Right VATS with drainage of pleural effusion, decortication, intercostal nerve block 02/20/2020   Chronic infective endocarditis    History of endocarditis 01/09/2020   Pleural effusion, not elsewhere classified 01/09/2020   Non-ST elevation (NSTEMI) myocardial infarction ([place]) 01/09/2020   S/P CABG x 4 04/03/2019   Chest pain 03/24/2019   NSTEMI (non-ST elevated myocardial infarction) ([place]) 03/24/2019   MSSA bacteremia 09/10/2017   Low back pain 09/07/2017   Hyperlipidemia, unspecified 09/07/2017   Back pain 09/07/2017   Fever    Home Medication(s) Prior to Admission medications   Medication Sig Start Date End Date Taking? Authorizing Provider  acetaminophen (TYLENOL) 325 MG tablet Take 650 mg by mouth every 6 (six) hours as needed for moderate pain.    [provider]  aspirin EC 81 MG tablet Take 81 mg by mouth daily.    [provider]  atorvastatin (LIPITOR) 40 MG tablet Take 40 mg by mouth at bedtime.    [provider]  b complex-vitamin c-folic acid (NEPHRO-VITE) 0.8 MG TABS tablet Take 1 tablet by mouth at bedtime.    [provider]  Dulaglutide 1.5 MG/0.5ML SOPN Inject 1.5 mg into the skin every Sunday.    [provider]  fluticasone (FLONASE) 50 MCG/ACT nasal spray Place 1 spray into both nostrils daily as needed for allergies or rhinitis.    [provider]  gabapentin (NEURONTIN) 100 MG capsule Take 2 capsules (200 mg total) by mouth at bedtime. 04/04/19   Barrett, Erin R, PA-C  glipiZIDE (GLUCOTROL XL) 2.5 MG 24 hr tablet Take 2.5 mg by mouth 2 (two) times daily.    [provider]  ibuprofen  (ADVIL) 200 MG tablet Take 600 mg by mouth every 6 (six) hours as needed for moderate pain.    [provider]  linagliptin (TRADJENTA) 5 MG TABS tablet Take 5 mg by mouth daily.    [provider]  midodrine (PROAMATINE) 10 MG tablet Take 1 tablet (10 mg total) by mouth 3 (three) times a week. Before Dialysis every Mon, Wed, Fri Patient taking differently: Take 10 mg by mouth 3 (three) times a week. Before Dialysis every [place], and Sat 12/04/22   Bensimhon, Shaune Pascal, MD  OVER THE COUNTER MEDICATION Place 1 drop into both eyes 2 (two) times daily as needed (dry eyes). Hyper Tears eye drops/Unknown strength    [provider]  pantoprazole (PROTONIX) 40 MG tablet Take 1 tablet (40 mg total) by mouth daily. 09/03/22   Collene Gobble, MD  RENVELA 800 MG tablet Take 1,600 mg by mouth 3 (three) times daily with meals. 01/07/23   [provider]  rOPINIRole (REQUIP) 0.5 MG tablet Take 0.5 mg by mouth See admin instructions. Take 0.5 at bedtime, take 0.5 mg before dialysis (Tues, Thurs, and Sat)    [provider]  SENSIPAR 30 MG tablet Take 30 mg by mouth daily  with supper. 09/11/21   [provider]  tamsulosin (FLOMAX) 0.4 MG CAPS capsule Take 0.4 mg by mouth daily. 08/19/20   [provider]  torsemide (DEMADEX) 100 MG tablet Take 100 mg by mouth daily. 12/02/22   [provider]  TRULICITY [id number] SOPN Inject 0.75 mg as directed every Sunday. 02/01/23   [provider]  zolpidem (AMBIEN) 10 MG tablet Take 10 mg by mouth at bedtime.    [provider]                                                                                                                                    Past Surgical History Past Surgical History:  Procedure Laterality Date   ABDOMINAL AORTIC ANEURYSM REPAIR     APPENDECTOMY     AV FISTULA PLACEMENT Right 10/18/2018   Procedure: BRACHIO-CEPHALIC ARTERIOVENOUS (AV) FISTULA  CREATION;  Surgeon: Angelia Mould, MD;  Location: [facility];  Service: Vascular;  Laterality: Right;   CARDIAC CATHETERIZATION     CORONARY ANGIOPLASTY     CORONARY ARTERY BYPASS GRAFT N/A 03/29/2019   Procedure: CORONARY ARTERY BYPASS GRAFTING (CABG) x 4, using left internal mammary artery and right leg greater saphenous vein harvested endoscopically;  Surgeon: Melrose Nakayama, MD;  Location: [facility];  Service: Open Heart Surgery;  Laterality: N/A;   CORONARY PRESSURE WIRE/FFR WITH 3D MAPPING N/A 03/27/2019   Procedure: Coronary Pressure Wire/FFR w/3D Mapping;  Surgeon: Jettie Booze, MD;  Location: [facility];  Service: Cardiovascular;  Laterality: N/A;   DECORTICATION Left 02/19/2020   Procedure: DECORTICATION;  Surgeon: Melrose Nakayama, MD;  Location: [facility];  Service: Thoracic;  Laterality: Left;   INTERCOSTAL NERVE BLOCK Left 02/19/2020   Procedure: Intercostal Nerve Block;  Surgeon: Melrose Nakayama, MD;  Location: [facility];  Service: Thoracic;  Laterality: Left;   IR FLUORO GUIDE CV LINE RIGHT  09/13/2017   IR REMOVAL TUN CV CATH W/O FL  10/22/2017   IR US GUIDE VASC ACCESS RIGHT  09/13/2017   LEFT HEART CATH AND CORONARY ANGIOGRAPHY N/A 03/27/2019   Procedure: LEFT HEART CATH AND CORONARY ANGIOGRAPHY;  Surgeon: Jettie Booze, MD;  Location: [facility];  Service: Cardiovascular;  Laterality: N/A;   NECK SURGERY     PLEURAL EFFUSION DRAINAGE Left 02/19/2020   Procedure: DRAINAGE OF PLEURAL EFFUSION;  Surgeon: Melrose Nakayama, MD;  Location: [facility];  Service: Thoracic;  Laterality: Left;   RIGHT HEART CATH AND CORONARY/GRAFT ANGIOGRAPHY N/A 02/09/2023   Procedure: RIGHT HEART CATH AND CORONARY/GRAFT ANGIOGRAPHY;  Surgeon: Jolaine Artist, MD;  Location: [facility];  Service: Cardiovascular;  Laterality: N/A;   SMALL INTESTINE SURGERY     TEE WITHOUT CARDIOVERSION N/A 09/10/2017   Procedure: TRANSESOPHAGEAL ECHOCARDIOGRAM (TEE);   Surgeon: Skeet Latch, MD;  Location: [facility];  Service: Cardiovascular;  Laterality: N/A;   TEE WITHOUT  CARDIOVERSION N/A 03/29/2019   Procedure: TRANSESOPHAGEAL ECHOCARDIOGRAM (TEE);  Surgeon: Melrose Nakayama, MD;  Location: [facility];  Service: Open Heart Surgery;  Laterality: N/A;   TEE WITHOUT CARDIOVERSION N/A 01/12/2020   Procedure: TRANSESOPHAGEAL ECHOCARDIOGRAM (TEE);  Surgeon: Sanda Klein, MD;  Location: [facility];  Service: Cardiovascular;  Laterality: N/A;   TEE WITHOUT CARDIOVERSION N/A 02/09/2023   Procedure: TRANSESOPHAGEAL ECHOCARDIOGRAM (TEE);  Surgeon: Jolaine Artist, MD;  [facility];  Service: Cardiovascular;  Laterality: N/A;   VIDEO ASSISTED THORACOSCOPY Left 02/19/2020   Procedure: VIDEO ASSISTED THORACOSCOPY;  Surgeon: Melrose Nakayama, MD;  Location: [facility];  Service: Thoracic;  Laterality: Left;   VIDEO BRONCHOSCOPY N/A 02/19/2020   Procedure: VIDEO BRONCHOSCOPY;  Surgeon: Melrose Nakayama, MD;  [facility];  Service: Thoracic;  Laterality: N/A;   Family History Family History  Problem Relation Age of Onset   Cancer Maternal Grandmother     Social History Social History   Tobacco Use   Smoking status: Former    Packs/day: 2.00    Years: 30.00    Additional pack years: 0.00    Total pack years: 60.00    Types: Cigarettes    Start date: 1968    Quit date: 1998    Years since quitting: 26.2   Smokeless tobacco: Never  Vaping Use   Vaping Use: Never used  Substance Use Topics   Alcohol use: Yes    Comment: Occasionally.   Drug use: Never   Allergies Penicillins  Review of Systems Review of Systems  All other systems reviewed and are negative.   Physical Exam Vital Signs  I have reviewed the triage vital signs BP 112/74   Pulse 94   Temp 98.6 F (37 C) (Oral)   Resp 12   SpO2 93%  Physical Exam Vitals and nursing note reviewed.  Constitutional:      General: He is not in acute distress.     Appearance: Normal appearance.  HENT:     Mouth/Throat:     Mouth: Mucous membranes are moist.  Eyes:     Conjunctiva/sclera: Conjunctivae normal.  Cardiovascular:     Rate and Rhythm: Normal rate and regular rhythm.  Pulmonary:     Comments: Mild increased work of breathing, trace right middle/lower lobe crackles, left lung clear.  No respiratory distress. Abdominal:     General: Abdomen is flat.     Palpations: Abdomen is soft.     Tenderness: There is no abdominal tenderness.  Musculoskeletal:     Right lower leg: No edema.     Left lower leg: No edema.  Skin:    General: Skin is warm and dry.     Capillary Refill: Capillary refill takes less than 2 seconds.  Neurological:     Mental Status: He is alert and oriented to person, place, and time. Mental status is at baseline.  Psychiatric:        Mood and Affect: Mood normal.        Behavior: Behavior normal.     ED Results and Treatments Labs (all labs ordered are listed, but only abnormal results are displayed) Labs Reviewed  BASIC METABOLIC PANEL - Abnormal; Notable for the following components:      Result Value   Chloride 91 (*)    Glucose, Bld 183 (*)    BUN 65 (*)    Creatinine, Ser 10.46 (*)    Calcium 8.2 (*)    GFR, Estimated 5 (*)  Anion gap 19 (*)    All other components within normal limits  CBC - Abnormal; Notable for the following components:   RBC 3.25 (*)    Hemoglobin 10.7 (*)    HCT 33.3 (*)    MCV 102.5 (*)    Platelets 128 (*)    All other components within normal limits  TROPONIN I (HIGH SENSITIVITY) - Abnormal; Notable for the following components:   Troponin I (High Sensitivity) 44 (*)    All other components within normal limits  CULTURE, BLOOD (ROUTINE X 2)  CULTURE, BLOOD (ROUTINE X 2)  LACTIC ACID, PLASMA  LACTIC ACID, PLASMA  HIV ANTIBODY (ROUTINE TESTING W REFLEX)  MAGNESIUM  TROPONIN I (HIGH SENSITIVITY)                                                                                                                           Radiology DG Chest 2 View  Result Date: 02/19/2023 CLINICAL DATA:  Chest pain.  Cough and cold symptoms x3 days. EXAM: CHEST - 2 VIEW COMPARISON:  11/04/2022. FINDINGS: Right middle lobe consolidation. Stable cardiac and mediastinal contours with postoperative changes of median sternotomy and CABG. No pleural effusion or pneumothorax. IMPRESSION: Right middle lobe pneumonia. Electronically Signed   By: Emmit Alexanders M.D.   On: 02/19/2023 13:23    Pertinent labs & imaging results that were available during my care of the patient were reviewed by me and considered in my medical decision making (see MDM for details).  Medications Ordered in ED Medications  azithromycin (ZITHROMAX) 500 mg in sodium chloride 0.9 % 250 mL IVPB (500 mg Intravenous New Bag/Given 02/19/23 1503)  aspirin EC tablet 81 mg (has no administration in time range)  atorvastatin (LIPITOR) tablet 40 mg (has no administration in time range)  midodrine (PROAMATINE) tablet 10 mg (has no administration in time range)  pantoprazole (PROTONIX) EC tablet 40 mg (has no administration in time range)  rOPINIRole (REQUIP) tablet 0.5 mg (has no administration in time range)  multivitamin (RENA-VIT) tablet 1 tablet (has no administration in time range)  heparin injection 5,000 Units (has no administration in time range)  cefTRIAXone (ROCEPHIN) 2 g in sodium chloride 0.9 % 100 mL IVPB (has no administration in time range)  azithromycin (ZITHROMAX) 500 mg in sodium chloride 0.9 % 250 mL IVPB (has no administration in time range)  sodium chloride flush (NS) 0.9 % injection 3 mL (has no administration in time range)  acetaminophen (TYLENOL) tablet 650 mg (has no administration in time range)    Or  acetaminophen (TYLENOL) suppository 650 mg (has no administration in time range)  polyethylene glycol (MIRALAX / GLYCOLAX) packet 17 g (has no administration in time range)  acetaminophen (TYLENOL)  tablet 1,000 mg (1,000 mg Oral Given 02/19/23 1500)  HYDROcodone bit-homatropine (HYCODAN) 5-1.5 MG/5ML syrup 5 mL (5 mLs Oral Given 02/19/23 1512)  sodium chloride 0.9 % bolus 500 mL (500 mLs Intravenous New Bag/Given 02/19/23 1511)  cefTRIAXone (  ROCEPHIN) 1 g in sodium chloride 0.9 % 100 mL IVPB (1 g Intravenous New Bag/Given 02/19/23 1510)                                                                                                                                     Procedures Procedures  (including critical care time)  Medical Decision Making / ED Course   MDM:  72 year old male presenting to the emergency department with cough.  Patient overall well-appearing, vitals notable for fever, blood pressure reassuring.  No tachycardia.  He does have some focal findings on his right lung exam.  Suspect pneumonia, chest x-ray does show right middle lobe infiltrate concerning for pneumonia.  Started on antibiotics.  Discussed with the hospitalist to admit the patient given his medical comorbidities and his borderline hypoxia.  Less likely edema, patient appears dehydrated on physical exam.  He does have some chest pain but it is atypical and really only with coughing, his troponin is mildly elevated in the setting of end-stage renal disease but he has no acute EKG changes.  Blood cultures obtained prior to initiation of antibiotics.  Also check lactate.      Additional history obtained: -External records from outside source obtained and reviewed including: Chart review including previous notes, labs, imaging, consultation notes including recent echocardiogram   Lab Tests: -I ordered, reviewed, and interpreted labs.   The pertinent results include:   Labs Reviewed  BASIC METABOLIC PANEL - Abnormal; Notable for the following components:      Result Value   Chloride 91 (*)    Glucose, Bld 183 (*)    BUN 65 (*)    Creatinine, Ser 10.46 (*)    Calcium 8.2 (*)    GFR, Estimated 5 (*)     Anion gap 19 (*)    All other components within normal limits  CBC - Abnormal; Notable for the following components:   RBC 3.25 (*)    Hemoglobin 10.7 (*)    HCT 33.3 (*)    MCV 102.5 (*)    Platelets 128 (*)    All other components within normal limits  TROPONIN I (HIGH SENSITIVITY) - Abnormal; Notable for the following components:   Troponin I (High Sensitivity) 44 (*)    All other components within normal limits  CULTURE, BLOOD (ROUTINE X 2)  CULTURE, BLOOD (ROUTINE X 2)  LACTIC ACID, PLASMA  LACTIC ACID, PLASMA  HIV ANTIBODY (ROUTINE TESTING W REFLEX)  MAGNESIUM  TROPONIN I (HIGH SENSITIVITY)    Notable for troponin elevation, likely ESRD/demand, no leukocytosis  EKG   EKG Interpretation  Date/Time:  Friday February 19 2023 12:40:49 EDT Ventricular Rate:  95 PR Interval:  194 QRS Duration: 100 QT Interval:  364 QTC Calculation: 457 R Axis:   -42 Text Interpretation: Normal sinus rhythm Left axis deviation T wave abnormality, consider lateral ischemia Abnormal ECG When compared with ECG of 04-Jan-2023 14:59,  No significant change since last tracing Confirmed by Garnette Gunner [id number]) on 02/19/2023 3:12:03 PM         Imaging Studies ordered: I ordered imaging studies including CXR On my interpretation imaging demonstrates RML pna I independently visualized and interpreted imaging. I agree with the radiologist interpretation   Medicines ordered and prescription drug management: Meds ordered this encounter  Medications   acetaminophen (TYLENOL) tablet 1,000 mg   HYDROcodone bit-homatropine (HYCODAN) 5-1.5 MG/5ML syrup 5 mL   sodium chloride 0.9 % bolus 500 mL   cefTRIAXone (ROCEPHIN) 1 g in sodium chloride 0.9 % 100 mL IVPB    Order Specific Question:   Antibiotic Indication:    Answer:   CAP   azithromycin (ZITHROMAX) 500 mg in sodium chloride 0.9 % 250 mL IVPB   aspirin EC tablet 81 mg   atorvastatin (LIPITOR) tablet 40 mg   midodrine (PROAMATINE) tablet 10  mg    OP [id number] Dialysis every Mon, Wed, Fri Patient taking differently: Before Dialysis every Tues, Thurs, and Sat     pantoprazole (PROTONIX) EC tablet 40 mg   rOPINIRole (REQUIP) tablet 0.5 mg    Take 0.5 at bedtime, take 0.5 mg before dialysis (Tues, Thurs, and Sat)     multivitamin (RENA-VIT) tablet 1 tablet   heparin injection 5,000 Units   cefTRIAXone (ROCEPHIN) 2 g in sodium chloride 0.9 % 100 mL IVPB    Order Specific Question:   Antibiotic Indication:    Answer:   CAP   azithromycin (ZITHROMAX) 500 mg in sodium chloride 0.9 % 250 mL IVPB    Order Specific Question:   Antibiotic Indication:    Answer:   CAP   sodium chloride flush (NS) 0.9 % injection 3 mL   OR Linked Order Group    acetaminophen (TYLENOL) tablet 650 mg    acetaminophen (TYLENOL) suppository 650 mg   polyethylene glycol (MIRALAX / GLYCOLAX) packet 17 g    -I have reviewed the patients home medicines and have made adjustments as needed   Consultations Obtained: I requested consultation with the hospitalist,  and discussed lab and imaging findings as well as pertinent plan - they recommend: admission   Cardiac Monitoring: The patient was maintained on a cardiac monitor.  I personally viewed and interpreted the cardiac monitored which showed an underlying rhythm of: NSR  Social Determinants of Health:  Diagnosis or treatment significantly limited by social determinants of health: former smoker   Reevaluation: After the interventions noted above, I reevaluated the patient and found that their symptoms have improved  Co morbidities that complicate the patient evaluation  Past Medical History:  Diagnosis Date   Allergy, unspecified, initial encounter 08/30/2020   Anaphylactic shock, unspecified, initial encounter 08/30/2020   Anemia in chronic kidney disease 08/30/2020   Arthritis    Back pain 09/07/2017   Carotid artery stenosis    [id number] LICA, [id number] RICA [id number] [place]   Chest pain 03/24/2019   Chronic  infective endocarditis    Chronic kidney disease (CKD), stage IV (severe) ([place]) 12/01/2018   CKD (chronic kidney disease), stage V ([place]) 03/24/2019   Coronary artery disease    Dependence on renal dialysis ([place]) 08/31/2020   Diabetes mellitus without complication ([place])    Disorder of phosphorus metabolism, unspecified 08/31/2020   DM (diabetes mellitus), type 2 with renal complications ([place]) [id number]   Dyspnea    Encounter for screening for respiratory tuberculosis 08/30/2020   End stage renal disease ([place]) 09/07/2017   ESRD (end stage  renal disease) ([place]) 08/27/2020   Essential (primary) hypertension 09/07/2017   Fever    History of endocarditis 01/09/2020   Hypercalcemia 10/08/2020   Hyperlipidemia, unspecified 09/07/2017   Hypertension    Hypokalemia 11/13/2021   Iron deficiency anemia, unspecified 08/30/2020   Low back pain 09/07/2017   MI (myocardial infarction) ([place])    MSSA bacteremia 09/10/2017   Non-ST elevation (NSTEMI) myocardial infarction ([place]) 01/09/2020   NSTEMI (non-ST elevated myocardial infarction) ([place]) 03/24/2019   Other specified arthritis, other site 08/31/2020   Pain, unspecified 08/30/2020   Personal history of other diseases of the circulatory system 08/31/2020   Presence of aortocoronary bypass graft 08/31/2020   Pruritus, unspecified 08/30/2020   Renal disorder    Restless legs syndrome 09/06/2020   S/P CABG x 4 04/03/2019   S/P Video Bronchoscopy, Right VATS with drainage of pleural effusion, decortication, intercostal nerve block 02/20/2020   Secondary hyperparathyroidism of renal origin ([place]) 08/30/2020   Thoracic aortic aneurysm (TAA) (HCC)    4.3 cm ascending TAA 02/02/20 CT   Type 2 diabetes mellitus with diabetic nephropathy ([place]) 08/30/2020      Dispostion: Disposition decision including need for hospitalization was considered, and patient admitted to the hospital.    Final Clinical Impression(s) / ED Diagnoses Final diagnoses:  Pneumonia of right middle  lobe due to infectious organism     This chart was dictated using voice recognition software.  Despite best efforts to proofread,  errors can occur which can change the documentation meaning.    Cristie Hem, MD 02/19/23 [phone]

## 2023-02-19 NOTE — H&P (Signed)
History and Physical   John [name] [id number] DOB: 06-21-1951 DOA: 02/19/2023  PCP: Rhea Bleacher, NP   Patient coming from: Home  Chief Complaint: Shortness of breath  HPI: John [name] is a 72 y.o. male with medical history significant of diabetes, pleural effusion status post bronc with VATS and decortication, CAD status post CABG, hypertension, hyperlipidemia, carotid artery disease, thoracic aortic aneurysm, RLS, ESRD on HD TTS, endocarditis, anemia, CHF presenting with shortness of breath.  Patient reports 3 days of shortness of breath and went to urgent care was told his x-ray appeared to show fluid overload.  He did miss dialysis yesterday due to feeling unwell**.  He reports fever at home as well as chest pain along with significant coughing.  The pain is primarily with coughing.  He denies chills, abdominal pain, constipation, diarrhea, nausea, vomiting.  ED Course: Signs in the ED significant for fever to 102.2.  Lab workup included BMP with chloride 91, BUN 69, creatinine elevated 10.46, glucose 183, calcium 8.2.  CBC with high normal WBC count at 10.1 from baseline around 6, hemoglobin stable at 10.7.  Troponin near previous at 44 with repeat pending.  Lactic acid pending.  Blood cultures pending.  Chest x-ray showed right middle lobe pneumonia.  Patient started on ceftriaxone and azithromycin and received 500 cc IV fluids in the ED.  Also given hydrocodone syrup.  Review of Systems: As per HPI otherwise all other systems reviewed and are negative.  Past Medical History:  Diagnosis Date   Allergy, unspecified, initial encounter 08/30/2020   Anaphylactic shock, unspecified, initial encounter 08/30/2020   Anemia in chronic kidney disease 08/30/2020   Arthritis    Back pain 09/07/2017   Carotid artery stenosis    [id number] LICA, [id number] RICA [id number] [place]   Chest pain 03/24/2019   Chronic infective endocarditis    Chronic kidney disease (CKD), stage IV (severe) ([place])  12/01/2018   CKD (chronic kidney disease), stage V ([place]) 03/24/2019   Coronary artery disease    Dependence on renal dialysis ([place]) 08/31/2020   Diabetes mellitus without complication (HCC)    Disorder of phosphorus metabolism, unspecified 08/31/2020   DM (diabetes mellitus), type 2 with renal complications ([place]) [id number]   Dyspnea    Encounter for screening for respiratory tuberculosis 08/30/2020   End stage renal disease ([place]) 09/07/2017   ESRD (end stage renal disease) ([place]) 08/27/2020   Essential (primary) hypertension 09/07/2017   Fever    History of endocarditis 01/09/2020   Hypercalcemia 10/08/2020   Hyperlipidemia, unspecified 09/07/2017   Hypertension    Hypokalemia 11/13/2021   Iron deficiency anemia, unspecified 08/30/2020   Low back pain 09/07/2017   MI (myocardial infarction) ([place])    MSSA bacteremia 09/10/2017   Non-ST elevation (NSTEMI) myocardial infarction ([place]) 01/09/2020   NSTEMI (non-ST elevated myocardial infarction) ([place]) 03/24/2019   Other specified arthritis, other site 08/31/2020   Pain, unspecified 08/30/2020   Personal history of other diseases of the circulatory system 08/31/2020   Presence of aortocoronary bypass graft 08/31/2020   Pruritus, unspecified 08/30/2020   Renal disorder    Restless legs syndrome 09/06/2020   S/P CABG x 4 04/03/2019   S/P Video Bronchoscopy, Right VATS with drainage of pleural effusion, decortication, intercostal nerve block 02/20/2020   Secondary hyperparathyroidism of renal origin ([place]) 08/30/2020   Thoracic aortic aneurysm (TAA) (HCC)    4.3 cm ascending TAA 02/02/20 CT   Type 2 diabetes mellitus with diabetic nephropathy ([place]) 08/30/2020    Past  Surgical History:  Procedure Laterality Date   ABDOMINAL AORTIC ANEURYSM REPAIR     APPENDECTOMY     AV FISTULA PLACEMENT Right 10/18/2018   Procedure: BRACHIO-CEPHALIC ARTERIOVENOUS (AV) FISTULA CREATION;  Surgeon: Angelia Mould, MD;  Location: [facility];  Service: Vascular;  Laterality:  Right;   CARDIAC CATHETERIZATION     CORONARY ANGIOPLASTY     CORONARY ARTERY BYPASS GRAFT N/A 03/29/2019   Procedure: CORONARY ARTERY BYPASS GRAFTING (CABG) x 4, using left internal mammary artery and right leg greater saphenous vein harvested endoscopically;  Surgeon: Melrose Nakayama, MD;  Location: [facility];  Service: Open Heart Surgery;  Laterality: N/A;   CORONARY PRESSURE WIRE/FFR WITH 3D MAPPING N/A 03/27/2019   Procedure: Coronary Pressure Wire/FFR w/3D Mapping;  Surgeon: Jettie Booze, MD;  Location: [facility];  Service: Cardiovascular;  Laterality: N/A;   DECORTICATION Left 02/19/2020   Procedure: DECORTICATION;  Surgeon: Melrose Nakayama, MD;  Location: [facility];  Service: Thoracic;  Laterality: Left;   INTERCOSTAL NERVE BLOCK Left 02/19/2020   Procedure: Intercostal Nerve Block;  Surgeon: Melrose Nakayama, MD;  Location: [facility];  Service: Thoracic;  Laterality: Left;   IR FLUORO GUIDE CV LINE RIGHT  09/13/2017   IR REMOVAL TUN CV CATH W/O FL  10/22/2017   IR US GUIDE VASC ACCESS RIGHT  09/13/2017   LEFT HEART CATH AND CORONARY ANGIOGRAPHY N/A 03/27/2019   Procedure: LEFT HEART CATH AND CORONARY ANGIOGRAPHY;  Surgeon: Jettie Booze, MD;  Location: [facility];  Service: Cardiovascular;  Laterality: N/A;   NECK SURGERY     PLEURAL EFFUSION DRAINAGE Left 02/19/2020   Procedure: DRAINAGE OF PLEURAL EFFUSION;  Surgeon: Melrose Nakayama, MD;  Location: [facility];  Service: Thoracic;  Laterality: Left;   RIGHT HEART CATH AND CORONARY/GRAFT ANGIOGRAPHY N/A 02/09/2023   Procedure: RIGHT HEART CATH AND CORONARY/GRAFT ANGIOGRAPHY;  Surgeon: Jolaine Artist, MD;  Location: [facility];  Service: Cardiovascular;  Laterality: N/A;   SMALL INTESTINE SURGERY     TEE WITHOUT CARDIOVERSION N/A 09/10/2017   Procedure: TRANSESOPHAGEAL ECHOCARDIOGRAM (TEE);  Surgeon: Skeet Latch, MD;  Location: [facility];  Service: Cardiovascular;  Laterality: N/A;    TEE WITHOUT CARDIOVERSION N/A 03/29/2019   Procedure: TRANSESOPHAGEAL ECHOCARDIOGRAM (TEE);  Surgeon: Melrose Nakayama, MD;  Location: [facility];  Service: Open Heart Surgery;  Laterality: N/A;   TEE WITHOUT CARDIOVERSION N/A 01/12/2020   Procedure: TRANSESOPHAGEAL ECHOCARDIOGRAM (TEE);  Surgeon: Sanda Klein, MD;  Location: [facility];  Service: Cardiovascular;  Laterality: N/A;   TEE WITHOUT CARDIOVERSION N/A 02/09/2023   Procedure: TRANSESOPHAGEAL ECHOCARDIOGRAM (TEE);  Surgeon: Jolaine Artist, MD;  Location: [facility];  Service: Cardiovascular;  Laterality: N/A;   VIDEO ASSISTED THORACOSCOPY Left 02/19/2020   Procedure: VIDEO ASSISTED THORACOSCOPY;  Surgeon: Melrose Nakayama, MD;  Location: [facility];  Service: Thoracic;  Laterality: Left;   VIDEO BRONCHOSCOPY N/A 02/19/2020   Procedure: VIDEO BRONCHOSCOPY;  Surgeon: Melrose Nakayama, MD;  Location: [facility];  Service: Thoracic;  Laterality: N/A;    Social History  reports that he quit smoking about 26 years ago. His smoking use included cigarettes. He started smoking about 56 years ago. He has a 60.00 pack-year smoking history. He has never used smokeless tobacco. He reports current alcohol use. He reports that he does not use drugs.  Allergies  Allergen Reactions   Penicillins Rash and Other (See Comments)    Did it involve swelling of the face/tongue/throat, SOB,  or low BP? No Did it involve sudden or severe rash/hives, skin peeling, or any reaction on the inside of your mouth or nose? Yes Did you need to seek medical attention at a hospital or doctor's office? Yes When did it last happen?      Childhood allergy If all above answers are "NO", may proceed with cephalosporin use.     Family History  Problem Relation Age of Onset   Cancer Maternal Grandmother   Reviewed on admission  Prior to Admission medications   Medication Sig Start Date End Date Taking? Authorizing Provider  acetaminophen (TYLENOL) 325 MG tablet  Take 650 mg by mouth every 6 (six) hours as needed for moderate pain.    [provider]  aspirin EC 81 MG tablet Take 81 mg by mouth daily.    [provider]  atorvastatin (LIPITOR) 40 MG tablet Take 40 mg by mouth at bedtime.    [provider]  b complex-vitamin c-folic acid (NEPHRO-VITE) 0.8 MG TABS tablet Take 1 tablet by mouth at bedtime.    [provider]  Dulaglutide 1.5 MG/0.5ML SOPN Inject 1.5 mg into the skin every Sunday.    [provider]  fluticasone (FLONASE) 50 MCG/ACT nasal spray Place 1 spray into both nostrils daily as needed for allergies or rhinitis.    [provider]  gabapentin (NEURONTIN) 100 MG capsule Take 2 capsules (200 mg total) by mouth at bedtime. 04/04/19   Barrett, Erin R, PA-C  glipiZIDE (GLUCOTROL XL) 2.5 MG 24 hr tablet Take 2.5 mg by mouth 2 (two) times daily.    [provider]  ibuprofen (ADVIL) 200 MG tablet Take 600 mg by mouth every 6 (six) hours as needed for moderate pain.    [provider]  linagliptin (TRADJENTA) 5 MG TABS tablet Take 5 mg by mouth daily.    [provider]  midodrine (PROAMATINE) 10 MG tablet Take 1 tablet (10 mg total) by mouth 3 (three) times a week. Before Dialysis every Mon, Wed, Fri Patient taking differently: Take 10 mg by mouth 3 (three) times a week. Before Dialysis every [place], and Sat 12/04/22   Bensimhon, Shaune Pascal, MD  OVER THE COUNTER MEDICATION Place 1 drop into both eyes 2 (two) times daily as needed (dry eyes). Hyper Tears eye drops/Unknown strength    [provider]  pantoprazole (PROTONIX) 40 MG tablet Take 1 tablet (40 mg total) by mouth daily. 09/03/22   Collene Gobble, MD  RENVELA 800 MG tablet Take 1,600 mg by mouth 3 (three) times daily with meals. 01/07/23   [provider]  rOPINIRole (REQUIP) 0.5 MG tablet Take 0.5 mg by mouth See admin instructions. Take 0.5 at bedtime, take 0.5 mg before dialysis (Tues,  Thurs, and Sat)    [provider]  SENSIPAR 30 MG tablet Take 30 mg by mouth daily with supper. 09/11/21   [provider]  tamsulosin (FLOMAX) 0.4 MG CAPS capsule Take 0.4 mg by mouth daily. 08/19/20   [provider]  torsemide (DEMADEX) 100 MG tablet Take 100 mg by mouth daily. 12/02/22   [provider]  TRULICITY [id number] SOPN Inject 0.75 mg as directed every Sunday. 02/01/23   [provider]  zolpidem (AMBIEN) 10 MG tablet Take 10 mg by mouth at bedtime.    [provider]    Physical Exam: Vitals:   02/19/23 1356 02/19/23 1430 02/19/23 1445 02/19/23 1515  BP: 126/68 112/61 112/74  Pulse: 69 94 94   Resp: 18 15 12    Temp: (!) 101.6 F (38.7 C)   98.6 F (37 C)  TempSrc: Oral   Oral  SpO2:  92% 93%     Physical Exam Constitutional:      General: He is not in acute distress.    Appearance: Normal appearance.  HENT:     Head: Normocephalic and atraumatic.     Mouth/Throat:     Mouth: Mucous membranes are moist.     Pharynx: Oropharynx is clear.  Eyes:     Extraocular Movements: Extraocular movements intact.     Pupils: Pupils are equal, round, and reactive to light.  Cardiovascular:     Rate and Rhythm: Normal rate and regular rhythm.     Pulses: Normal pulses.     Heart sounds: Normal heart sounds.  Pulmonary:     Effort: Pulmonary effort is normal. No respiratory distress.     Breath sounds: Normal breath sounds.  Abdominal:     General: Bowel sounds are normal. There is no distension.     Palpations: Abdomen is soft.     Tenderness: There is no abdominal tenderness.  Musculoskeletal:        General: No swelling or deformity.  Skin:    General: Skin is warm and dry.  Neurological:     General: No focal deficit present.     Mental Status: Mental status is at baseline.    Labs on Admission: I have personally reviewed following labs and imaging studies  CBC: Recent Labs  Lab 02/19/23 1246  WBC 10.1   HGB 10.7*  HCT 33.3*  MCV 102.5*  PLT 128*    Basic Metabolic Panel: Recent Labs  Lab 02/19/23 1246  NA 135  K 4.9  CL 91*  CO2 25  GLUCOSE 183*  BUN 65*  CREATININE 10.46*  CALCIUM 8.2*    GFR: Estimated Creatinine Clearance: 7.7 mL/min (A) (by C-G formula based on SCr of 10.46 mg/dL (H)).  Liver Function Tests: No results for input(s): "AST", "ALT", "ALKPHOS", "BILITOT", "PROT", "ALBUMIN" in the last 168 hours.  Urine analysis:    Component Value Date/Time   COLORURINE STRAW (A) 08/27/2020 1155   APPEARANCEUR CLEAR 08/27/2020 1155   LABSPEC 1.009 08/27/2020 1155   PHURINE 6.0 08/27/2020 1155   GLUCOSEU 50 (A) 08/27/2020 1155   HGBUR SMALL (A) 08/27/2020 1155   BILIRUBINUR NEGATIVE 08/27/2020 [place] 08/27/2020 1155   PROTEINUR 100 (A) 08/27/2020 1155   NITRITE NEGATIVE 08/27/2020 1155   LEUKOCYTESUR NEGATIVE 08/27/2020 1155    Radiological Exams on Admission: DG Chest 2 View  Result Date: 02/19/2023 CLINICAL DATA:  Chest pain.  Cough and cold symptoms x3 days. EXAM: CHEST - 2 VIEW COMPARISON:  11/04/2022. FINDINGS: Right middle lobe consolidation. Stable cardiac and mediastinal contours with postoperative changes of median sternotomy and CABG. No pleural effusion or pneumothorax. IMPRESSION: Right middle lobe pneumonia. Electronically Signed   By: Emmit Alexanders M.D.   On: 02/19/2023 13:23    EKG: Independently reviewed.  Sinus rhythm at 95 bpm.  Nonspecific T wave flattening.  And nonspecific intraventricular conduction delay with QRS of 100.  Assessment/Plan Principal Problem:   CAP (community acquired pneumonia) Active Problems:   Hyperlipidemia, unspecified   S/P CABG x 4   S/P Video Bronchoscopy, Right VATS with drainage of pleural effusion, decortication, intercostal nerve block   ESRD (end stage renal disease) ([place])   Hypertension   Type 2 diabetes  mellitus with diabetic nephropathy (HCC)   Carotid artery stenosis   Anemia in  chronic kidney disease   Restless legs syndrome   Pneumonia > Patient presenting with ongoing shortness of breath for the past few days.  Noted to have fever to 102.2.  No true leukocytosis but WBCs are elevated to 10.1 from baseline of 6.  Chest x-ray convincing for right middle lobe pneumonia new from recent imaging.  Does not appear volume overloaded on exam. > Started on ceftriaxone and azithromycin in the ED.  Has allergy listed to penicillin but has tolerated Ancef in the past. - Monitor on telemetry - Continue with ceftriaxone and azithromycin - Trend fever curve and WBC - Follow-up blood cultures  ESRD on HD > Missed his last dialysis session but did not appear volume overloaded.  Electrolytes are stable but does have elevation in BUN and creatinine. - Consult nephrology for HD while here - Continue home Renvela - Midodrine with dialysis   CHF > Known history of chronic systolic CHF with last echo was TEE 10 days ago with EF less than 20%, global hypokinesis, severely reduced RV function. > Troponin relatively flat at 44.  Not appearing volume overloaded on exam. - Continue to manage fluid with dialysis - Hold home torsemide - Continue midodrine with dialysis  CAD HLD Carotid artery disease > Status post CABG - Continue home aspirin, atorvastatin  RLS - Continue home ropinirole - Continue home gabapentin  Anemia > Hemoglobin stable at 10.7 - Trend CBC  Thoracic aortic aneurysm History of endocarditis History of pleural effusion status post VATS and decortication - Noted  DVT prophylaxis: Heparin Code Status:   Full Family Communication:  Grandson at bedside, number added to chart. Disposition Plan:   Patient is from:  Home  Anticipated DC to:  Home  Anticipated DC date:  1 to 5 days  Anticipated DC barriers: None  Consults called:  None Admission status:  Observation, telemetry  Severity of Illness: The appropriate patient status for this patient is  OBSERVATION. Observation status is judged to be reasonable and necessary in order to provide the required intensity of service to ensure the patient's safety. The patient's presenting symptoms, physical exam findings, and initial radiographic and laboratory data in the context of their medical condition is felt to place them at decreased risk for further clinical deterioration. Furthermore, it is anticipated that the patient will be medically stable for discharge from the hospital within 2 midnights of admission.    Marcelyn Bruins MD [facility]  How to contact the [facility] Attending or Consulting provider [place] or covering provider during after hours [place], for this patient?   Check the care team in [facility] and look for a) attending/consulting [facility] provider listed and b) the [facility] team listed Log into [email or web address] and use [facility]'s universal password to access. If you do not have the password, please contact the hospital operator. Locate the [facility] provider you are looking for under [facility] and page to a number that you can be directly reached. If you still have difficulty reaching the provider, please page the [facility] (Director on Call) for the Hospitalists listed on amion for assistance.  02/19/2023, 3:44 PM

## 2023-02-19 NOTE — ED Triage Notes (Signed)
Pt came in via POV d/t SOB & weakness that started Tuesday last week (02/09/23). Pt states that he went to an UC in [place] an xray showed fluid (per pt) He also states that he only went to 1 of his 3 dialysis appointment this week & Temp was 102.2, A/Ox4, Has CP rates it 10/10.

## 2023-02-19 NOTE — Consult Note (Signed)
[facility] Renal Consultation Note    Indication for Consultation:  Management of ESRD/hemodialysis, anemia, hypertension/volume, and secondary hyperparathyroidism. PCP:  HPI: John [name] is a 72 y.o. male with ESRD, CAD s/p CABG, T2DM, HTN, known thoracic aortic aneurysm, and Hx VATS procedure for recurrent pleural effusions who is being admitted with pneumonia.   Presented to the ED today with productive cough and fever since Tuesday which has progressed. Some dyspnea when coughing, ok at rest. No sick contacts. He was feeling very weak and was not able to make it to dialysis yesterday. No chest or abdominal pain, except when coughing. In the ED, noted to have T 102.54F. Labs with Na 135, K 4.9, Ca 8.2, trop 44 -> 44, WBC 10.1, Hgb 10.7. CXR with RML pneumonia. He was started on IV Ceftriaxone + azithro and admitted OBS status.  Dialyzes on TTS schedule at [facility] unit. Missed his last HD (yesterday). On room air at time of my visit, although with O2 sat 90-92% range. He has a well developed RUE AVF as his access, no recent issues.  Past Medical History:  Diagnosis Date   Allergy, unspecified, initial encounter 08/30/2020   Anaphylactic shock, unspecified, initial encounter 08/30/2020   Anemia in chronic kidney disease 08/30/2020   Arthritis    Back pain 09/07/2017   Carotid artery stenosis    [id number] LICA, [id number] RICA [id number] [place]   Chest pain 03/24/2019   Chronic infective endocarditis    Chronic kidney disease (CKD), stage IV (severe) ([place]) 12/01/2018   CKD (chronic kidney disease), stage V ([place]) 03/24/2019   Coronary artery disease    Dependence on renal dialysis ([place]) 08/31/2020   Diabetes mellitus without complication (HCC)    Disorder of phosphorus metabolism, unspecified 08/31/2020   DM (diabetes mellitus), type 2 with renal complications ([place]) [id number]   Dyspnea    Encounter for screening for respiratory tuberculosis 08/30/2020   End stage renal disease ([place])  09/07/2017   ESRD (end stage renal disease) ([place]) 08/27/2020   Essential (primary) hypertension 09/07/2017   Fever    History of endocarditis 01/09/2020   Hypercalcemia 10/08/2020   Hyperlipidemia, unspecified 09/07/2017   Hypertension    Hypokalemia 11/13/2021   Iron deficiency anemia, unspecified 08/30/2020   Low back pain 09/07/2017   MI (myocardial infarction) ([place])    MSSA bacteremia 09/10/2017   Non-ST elevation (NSTEMI) myocardial infarction ([place]) 01/09/2020   NSTEMI (non-ST elevated myocardial infarction) ([place]) 03/24/2019   Other specified arthritis, other site 08/31/2020   Pain, unspecified 08/30/2020   Personal history of other diseases of the circulatory system 08/31/2020   Presence of aortocoronary bypass graft 08/31/2020   Pruritus, unspecified 08/30/2020   Renal disorder    Restless legs syndrome 09/06/2020   S/P CABG x 4 04/03/2019   S/P Video Bronchoscopy, Right VATS with drainage of pleural effusion, decortication, intercostal nerve block 02/20/2020   Secondary hyperparathyroidism of renal origin ([place]) 08/30/2020   Thoracic aortic aneurysm (TAA) (HCC)    4.3 cm ascending TAA 02/02/20 CT   Type 2 diabetes mellitus with diabetic nephropathy ([place]) 08/30/2020   Past Surgical History:  Procedure Laterality Date   ABDOMINAL AORTIC ANEURYSM REPAIR     APPENDECTOMY     AV FISTULA PLACEMENT Right 10/18/2018   Procedure: BRACHIO-CEPHALIC ARTERIOVENOUS (AV) FISTULA CREATION;  Surgeon: Angelia Mould, MD;  Location: [place];  Service: Vascular;  Laterality: Right;   CARDIAC CATHETERIZATION     CORONARY ANGIOPLASTY     CORONARY ARTERY BYPASS  GRAFT N/A 03/29/2019   Procedure: CORONARY ARTERY BYPASS GRAFTING (CABG) x 4, using left internal mammary artery and right leg greater saphenous vein harvested endoscopically;  Surgeon: Melrose Nakayama, MD;  Location: [place];  Service: Open Heart Surgery;  Laterality: N/A;   CORONARY PRESSURE WIRE/FFR WITH 3D MAPPING N/A 03/27/2019   Procedure:  Coronary Pressure Wire/FFR w/3D Mapping;  Surgeon: Jettie Booze, MD;  Location: [place] [facility];  Service: Cardiovascular;  Laterality: N/A;   DECORTICATION Left 02/19/2020   Procedure: DECORTICATION;  Surgeon: Melrose Nakayama, MD;  Location: [place];  Service: Thoracic;  Laterality: Left;   INTERCOSTAL NERVE BLOCK Left 02/19/2020   Procedure: Intercostal Nerve Block;  Surgeon: Melrose Nakayama, MD;  Location: [place];  Service: Thoracic;  Laterality: Left;   IR FLUORO GUIDE CV LINE RIGHT  09/13/2017   IR REMOVAL TUN CV CATH W/O FL  10/22/2017   IR US GUIDE VASC ACCESS RIGHT  09/13/2017   LEFT HEART CATH AND CORONARY ANGIOGRAPHY N/A 03/27/2019   Procedure: LEFT HEART CATH AND CORONARY ANGIOGRAPHY;  Surgeon: Jettie Booze, MD;  Location: [place] [facility];  Service: Cardiovascular;  Laterality: N/A;   NECK SURGERY     PLEURAL EFFUSION DRAINAGE Left 02/19/2020   Procedure: DRAINAGE OF PLEURAL EFFUSION;  Surgeon: Melrose Nakayama, MD;  Location: [place];  Service: Thoracic;  Laterality: Left;   RIGHT HEART CATH AND CORONARY/GRAFT ANGIOGRAPHY N/A 02/09/2023   Procedure: RIGHT HEART CATH AND CORONARY/GRAFT ANGIOGRAPHY;  Surgeon: Jolaine Artist, MD;  Location: [place] [facility];  Service: Cardiovascular;  Laterality: N/A;   SMALL INTESTINE SURGERY     TEE WITHOUT CARDIOVERSION N/A 09/10/2017   Procedure: TRANSESOPHAGEAL ECHOCARDIOGRAM (TEE);  Surgeon: Skeet Latch, MD;  Location: [place];  Service: Cardiovascular;  Laterality: N/A;   TEE WITHOUT CARDIOVERSION N/A 03/29/2019   Procedure: TRANSESOPHAGEAL ECHOCARDIOGRAM (TEE);  Surgeon: Melrose Nakayama, MD;  Location: [place];  Service: Open Heart Surgery;  Laterality: N/A;   TEE WITHOUT CARDIOVERSION N/A 01/12/2020   Procedure: TRANSESOPHAGEAL ECHOCARDIOGRAM (TEE);  Surgeon: Sanda Klein, MD;  Location: [place];  Service: Cardiovascular;  Laterality: N/A;   TEE WITHOUT CARDIOVERSION N/A 02/09/2023    Procedure: TRANSESOPHAGEAL ECHOCARDIOGRAM (TEE);  Surgeon: Jolaine Artist, MD;  Location: [place];  Service: Cardiovascular;  Laterality: N/A;   VIDEO ASSISTED THORACOSCOPY Left 02/19/2020   Procedure: VIDEO ASSISTED THORACOSCOPY;  Surgeon: Melrose Nakayama, MD;  Location: [place];  Service: Thoracic;  Laterality: Left;   VIDEO BRONCHOSCOPY N/A 02/19/2020   Procedure: VIDEO BRONCHOSCOPY;  Surgeon: Melrose Nakayama, MD;  Location: [facility] OR;  Service: Thoracic;  Laterality: N/A;   Family History  Problem Relation Age of Onset   Cancer Maternal Grandmother    Social History:  reports that he quit smoking about 26 years ago. His smoking use included cigarettes. He started smoking about 56 years ago. He has a 60.00 pack-year smoking history. He has never used smokeless tobacco. He reports current alcohol use. He reports that he does not use drugs.  ROS:  + fever, chills, mild dyspnea, productive cough - CP, abdominal pain, N/V/D, edema  Physical Exam: Vitals:   02/19/23 1356 02/19/23 1430 02/19/23 1445 02/19/23 1515  BP: 126/68 112/61 112/74   Pulse: 69 94 94   Resp: 18 15 12    Temp: (!) 101.6 F (38.7 C)   98.6 F (37 C)  TempSrc: Oral   Oral  SpO2:  92% 93%  General: Well developed, well nourished, in no acute distress. Room air. Head: Normocephalic, atraumatic, sclera non-icteric, mucus membranes are moist. Neck: Supple without lymphadenopathy/masses. JVD not elevated. Lungs: Rhonchi throughout Heart: RRR; no murmur. Well healed prior CABG scar Abdomen: Soft, non-tender, non-distended with normoactive bowel sounds. Musculoskeletal:  Strength and tone appear normal for age. Lower extremities: No edema or ischemic changes, no open wounds. Neuro: Alert and oriented X 3. Moves all extremities spontaneously. Psych:  Responds to questions appropriately with a normal affect. Dialysis Access: RUE AVF + thrill/bruit  Allergies  Allergen Reactions   Penicillins Rash  and Other (See Comments)    Did it involve swelling of the face/tongue/throat, SOB, or low BP? No Did it involve sudden or severe rash/hives, skin peeling, or any reaction on the inside of your mouth or nose? Yes Did you need to seek medical attention at a hospital or doctor's office? Yes When did it last happen?      Childhood allergy If all above answers are "NO", may proceed with cephalosporin use.    Prior to Admission medications   Medication Sig Start Date End Date Taking? Authorizing Provider  acetaminophen (TYLENOL) 325 MG tablet Take 650 mg by mouth every 6 (six) hours as needed for moderate pain.    [provider]  aspirin EC 81 MG tablet Take 81 mg by mouth daily.    [provider]  atorvastatin (LIPITOR) 40 MG tablet Take 40 mg by mouth at bedtime.    [provider]  b complex-vitamin c-folic acid (NEPHRO-VITE) 0.8 MG TABS tablet Take 1 tablet by mouth at bedtime.    [provider]  Dulaglutide 1.5 MG/0.5ML SOPN Inject 1.5 mg into the skin every Sunday.    [provider]  fluticasone (FLONASE) 50 MCG/ACT nasal spray Place 1 spray into both nostrils daily as needed for allergies or rhinitis.    [provider]  gabapentin (NEURONTIN) 100 MG capsule Take 2 capsules (200 mg total) by mouth at bedtime. 04/04/19   Barrett, Erin R, PA-C  glipiZIDE (GLUCOTROL XL) 2.5 MG 24 hr tablet Take 2.5 mg by mouth 2 (two) times daily.    [provider]  ibuprofen (ADVIL) 200 MG tablet Take 600 mg by mouth every 6 (six) hours as needed for moderate pain.    [provider]  linagliptin (TRADJENTA) 5 MG TABS tablet Take 5 mg by mouth daily.    [provider]  midodrine (PROAMATINE) 10 MG tablet Take 1 tablet (10 mg total) by mouth 3 (three) times a week. Before Dialysis every Mon, Wed, Fri Patient taking differently: Take 10 mg by mouth 3 (three) times a week. Before Dialysis every [place], and Sat 12/04/22    Bensimhon, Shaune Pascal, MD  OVER THE COUNTER MEDICATION Place 1 drop into both eyes 2 (two) times daily as needed (dry eyes). Hyper Tears eye drops/Unknown strength    [provider]  pantoprazole (PROTONIX) 40 MG tablet Take 1 tablet (40 mg total) by mouth daily. 09/03/22   Collene Gobble, MD  RENVELA 800 MG tablet Take 1,600 mg by mouth 3 (three) times daily with meals. 01/07/23   [provider]  rOPINIRole (REQUIP) 0.5 MG tablet Take 0.5 mg by mouth See admin instructions. Take 0.5 at bedtime, take 0.5 mg before dialysis (Tues, Thurs, and Sat)    [provider]  SENSIPAR 30 MG tablet Take 30 mg by mouth daily with supper. 09/11/21   [provider]  tamsulosin (FLOMAX) 0.4 MG CAPS capsule Take 0.4 mg by mouth daily. 08/19/20   [provider]  torsemide (DEMADEX) 100 MG tablet Take 100 mg by mouth daily. 12/02/22   [provider]  TRULICITY [id number] SOPN Inject 0.75 mg as directed every Sunday. 02/01/23   [provider]  zolpidem (AMBIEN) 10 MG tablet Take 10 mg by mouth at bedtime.    [provider]   Current Facility-Administered Medications  Medication Dose Route Frequency Provider Last Rate Last Admin   acetaminophen (TYLENOL) tablet 650 mg  650 mg Oral Q6H PRN Marcelyn Bruins, MD       Or   acetaminophen (TYLENOL) suppository 650 mg  650 mg Rectal Q6H PRN Marcelyn Bruins, MD       [START ON 02/20/2023] aspirin EC tablet 81 mg  81 mg Oral Daily Marcelyn Bruins, MD       atorvastatin (LIPITOR) tablet 40 mg  40 mg Oral QHS Marcelyn Bruins, MD       azithromycin (ZITHROMAX) 500 mg in sodium chloride 0.9 % 250 mL IVPB  500 mg Intravenous Once Cristie Hem, MD 250 mL/hr at 02/19/23 1503 500 mg at 02/19/23 1503   [START ON 02/20/2023] azithromycin (ZITHROMAX) 500 mg in sodium chloride 0.9 % 250 mL IVPB  500 mg Intravenous Q24H Marcelyn Bruins, MD       [START ON 02/20/2023] cefTRIAXone (ROCEPHIN)  2 g in sodium chloride 0.9 % 100 mL IVPB  2 g Intravenous Q24H Marcelyn Bruins, MD       heparin injection 5,000 Units  5,000 Units Subcutaneous Q8H Marcelyn Bruins, MD       [START ON 02/20/2023] midodrine (PROAMATINE) tablet 10 mg  10 mg Oral Q T,Th,Sat-1800 Marcelyn Bruins, MD       multivitamin (RENA-VIT) tablet 1 tablet  1 tablet Oral QHS Marcelyn Bruins, MD       [START ON 02/20/2023] pantoprazole (PROTONIX) EC tablet 40 mg  40 mg Oral Daily Marcelyn Bruins, MD       polyethylene glycol (MIRALAX / GLYCOLAX) packet 17 g  17 g Oral Daily PRN Marcelyn Bruins, MD       regadenoson (LEXISCAN) injection SOLN 0.4 mg  0.4 mg Intravenous Once Revankar, Reita Cliche, MD       rOPINIRole (REQUIP) tablet 0.5 mg  0.5 mg Oral QHS Ventura Sellers, RPH       And   [START ON 02/20/2023] rOPINIRole (REQUIP) tablet 0.5 mg  0.5 mg Oral Q T,Th,Sat-1800 Ventura Sellers, RPH       sodium chloride flush (NS) 0.9 % injection 3 mL  3 mL Intravenous Q12H Marcelyn Bruins, MD   3 mL at 02/19/23 1549   technetium tetrofosmin (TC-MYOVIEW) injection [id number] millicurie  [id number] millicurie Intravenous Once PRN Revankar, Reita Cliche, MD       technetium tetrofosmin (TC-MYOVIEW) injection [id number] millicurie  [id number] millicurie Intravenous Once PRN Revankar, Reita Cliche, MD       Current Outpatient Medications  Medication Sig Dispense Refill   acetaminophen (TYLENOL) 325 MG tablet Take 650 mg by mouth every 6 (six) hours as needed for moderate pain.     aspirin EC 81 MG tablet Take 81 mg by mouth daily.     atorvastatin (LIPITOR) 40 MG tablet Take 40 mg by mouth at bedtime.     b complex-vitamin c-folic acid (NEPHRO-VITE) 0.8 MG TABS tablet Take 1 tablet by mouth  at bedtime.     Dulaglutide 1.5 MG/0.5ML SOPN Inject 1.5 mg into the skin every Sunday.     fluticasone (FLONASE) 50 MCG/ACT nasal spray Place 1 spray into both nostrils daily as needed for allergies or rhinitis.     gabapentin (NEURONTIN) 100 MG capsule Take 2  capsules (200 mg total) by mouth at bedtime.     glipiZIDE (GLUCOTROL XL) 2.5 MG 24 hr tablet Take 2.5 mg by mouth 2 (two) times daily.     ibuprofen (ADVIL) 200 MG tablet Take 600 mg by mouth every 6 (six) hours as needed for moderate pain.     linagliptin (TRADJENTA) 5 MG TABS tablet Take 5 mg by mouth daily.     midodrine (PROAMATINE) 10 MG tablet Take 1 tablet (10 mg total) by mouth 3 (three) times a week. Before Dialysis every Mon, Wed, Fri (Patient taking differently: Take 10 mg by mouth 3 (three) times a week. Before Dialysis every Tues, Thurs, and Sat) 45 tablet 3   OVER THE COUNTER MEDICATION Place 1 drop into both eyes 2 (two) times daily as needed (dry eyes). Hyper Tears eye drops/Unknown strength     pantoprazole (PROTONIX) 40 MG tablet Take 1 tablet (40 mg total) by mouth daily. 30 tablet 5   RENVELA 800 MG tablet Take 1,600 mg by mouth 3 (three) times daily with meals.     rOPINIRole (REQUIP) 0.5 MG tablet Take 0.5 mg by mouth See admin instructions. Take 0.5 at bedtime, take 0.5 mg before dialysis (Tues, Thurs, and Sat)     SENSIPAR 30 MG tablet Take 30 mg by mouth daily with supper.     tamsulosin (FLOMAX) 0.4 MG CAPS capsule Take 0.4 mg by mouth daily.     torsemide (DEMADEX) 100 MG tablet Take 100 mg by mouth daily.     TRULICITY [id number] SOPN Inject 0.75 mg as directed every Sunday.     zolpidem (AMBIEN) 10 MG tablet Take 10 mg by mouth at bedtime.     Labs: Basic Metabolic Panel: Recent Labs  Lab 02/19/23 1246  NA 135  K 4.9  CL 91*  CO2 25  GLUCOSE 183*  BUN 65*  CREATININE 10.46*  CALCIUM 8.2*   CBC: Recent Labs  Lab 02/19/23 1246  WBC 10.1  HGB 10.7*  HCT 33.3*  MCV 102.5*  PLT 128*   Studies/Results: DG Chest 2 View  Result Date: 02/19/2023 CLINICAL DATA:  Chest pain.  Cough and cold symptoms x3 days. EXAM: CHEST - 2 VIEW COMPARISON:  11/04/2022. FINDINGS: Right middle lobe consolidation. Stable cardiac and mediastinal contours with postoperative  changes of median sternotomy and CABG. No pleural effusion or pneumothorax. IMPRESSION: Right middle lobe pneumonia. Electronically Signed   By: Emmit Alexanders M.D.   On: 02/19/2023 13:23    Dialysis Orders:  TTS at [facility] - last HD 3/26 (left 89.2kg) 4hr, 400/A1.5, EDW 88.5kg, 2K/2Ca, UFP #2, RUE AVF, no heparin - no ESA - calcitriol 43mcg PO q HD  Assessment/Plan:  RML pneumonia: Started on Ceftriaxone + azithromycin. Room air at the moment, may need some O2.  ESRD: Usual TTS schedule - missed HD yesterday. Will plan to dialyze tomorrow AM using AVF.  Hypertension/volume: BP controlled, no LE edema. No overt pulm edema on CXR.  Anemia: Hgb 10.7- trend for now, does not get ESA as outpatient.  Metabolic bone disease: Ca ok, Phos pending. Continue home binders. CAD, Hx CABG T2DM  Veneta Penton, PA-C 02/19/2023, 4:00 PM  [facility]

## 2023-02-19 NOTE — ED Notes (Addendum)
ED TO INPATIENT HANDOFF REPORT  ED Nurse Name and Phone #: Shaconda Hajduk/ [phone]  S Name/Age/Gender John [name] 72 y.o. male Room/Bed: 030C/030C  Code Status   Code Status: Full Code  Home/SNF/Other Home Patient oriented to: self, place, time, and situation Is this baseline? Yes   Triage Complete: Triage complete  Chief Complaint CAP (community acquired pneumonia) [J18.9]  Triage Note Pt came in via POV d/t SOB & weakness that started Tuesday last week (02/09/23). Pt states that he went to an UC in [place] an xray showed fluid (per pt) He also states that he only went to 1 of his 3 dialysis appointment this week & Temp was 102.2, A/Ox4, Has CP rates it 10/10.     Allergies Allergies  Allergen Reactions   Penicillins Rash and Other (See Comments)    Did it involve swelling of the face/tongue/throat, SOB, or low BP? No Did it involve sudden or severe rash/hives, skin peeling, or any reaction on the inside of your mouth or nose? Yes Did you need to seek medical attention at a hospital or doctor's office? Yes When did it last happen?      Childhood allergy If all above answers are "NO", may proceed with cephalosporin use.     Level of Care/Admitting Diagnosis ED Disposition     ED Disposition  Admit   Condition  --   Comment  Hospital Area: [facility] [[id number]]  Level of Care: Telemetry Medical [104]  May place patient in observation at [facility] or [facility] if equivalent level of care is available:: No  Covid Evaluation: Asymptomatic - no recent exposure (last 10 days) testing not required  Diagnosis: CAP (community acquired pneumonia) [id number]  Admitting Physician: Marcelyn Bruins [id number]  Attending Physician: Marcelyn Bruins [id number]          B Medical/Surgery History Past Medical History:  Diagnosis Date   Allergy, unspecified, initial encounter 08/30/2020   Anaphylactic shock, unspecified, initial encounter 08/30/2020    Anemia in chronic kidney disease 08/30/2020   Arthritis    Back pain 09/07/2017   Carotid artery stenosis    123456 LICA, 123456 RICA [id number] [place]   Chest pain 03/24/2019   Chronic infective endocarditis    Chronic kidney disease (CKD), stage IV (severe) ([place]) 12/01/2018   CKD (chronic kidney disease), stage V ([place]) 03/24/2019   Coronary artery disease    Dependence on renal dialysis ([place]) 08/31/2020   Diabetes mellitus without complication ([place])    Disorder of phosphorus metabolism, unspecified 08/31/2020   DM (diabetes mellitus), type 2 with renal complications ([place]) [id number]   Dyspnea    Encounter for screening for respiratory tuberculosis 08/30/2020   End stage renal disease ([place]) 09/07/2017   ESRD (end stage renal disease) ([place]) 08/27/2020   Essential (primary) hypertension 09/07/2017   Fever    History of endocarditis 01/09/2020   Hypercalcemia 10/08/2020   Hyperlipidemia, unspecified 09/07/2017   Hypertension    Hypokalemia 11/13/2021   Iron deficiency anemia, unspecified 08/30/2020   Low back pain 09/07/2017   MI (myocardial infarction) ([place])    MSSA bacteremia 09/10/2017   Non-ST elevation (NSTEMI) myocardial infarction ([place]) 01/09/2020   NSTEMI (non-ST elevated myocardial infarction) ([place]) 03/24/2019   Other specified arthritis, other site 08/31/2020   Pain, unspecified 08/30/2020   Personal history of other diseases of the circulatory system 08/31/2020   Presence of aortocoronary bypass graft 08/31/2020   Pruritus, unspecified 08/30/2020   Renal disorder  Restless legs syndrome 09/06/2020   S/P CABG x 4 04/03/2019   S/P Video Bronchoscopy, Right VATS with drainage of pleural effusion, decortication, intercostal nerve block 02/20/2020   Secondary hyperparathyroidism of renal origin ([place]) 08/30/2020   Thoracic aortic aneurysm (TAA) ([place])    4.3 cm ascending TAA 02/02/20 CT   Type 2 diabetes mellitus with diabetic nephropathy ([place]) 08/30/2020   Past Surgical History:  Procedure  Laterality Date   ABDOMINAL AORTIC ANEURYSM REPAIR     APPENDECTOMY     AV FISTULA PLACEMENT Right 10/18/2018   Procedure: BRACHIO-CEPHALIC ARTERIOVENOUS (AV) FISTULA CREATION;  Surgeon: Angelia Mould, MD;  Location: [facility];  Service: Vascular;  Laterality: Right;   CARDIAC CATHETERIZATION     CORONARY ANGIOPLASTY     CORONARY ARTERY BYPASS GRAFT N/A 03/29/2019   Procedure: CORONARY ARTERY BYPASS GRAFTING (CABG) x 4, using left internal mammary artery and right leg greater saphenous vein harvested endoscopically;  Surgeon: Melrose Nakayama, MD;  Location: [facility];  Service: Open Heart Surgery;  Laterality: N/A;   CORONARY PRESSURE WIRE/FFR WITH 3D MAPPING N/A 03/27/2019   Procedure: Coronary Pressure Wire/FFR w/3D Mapping;  Surgeon: Jettie Booze, MD;  Location: [facility];  Service: Cardiovascular;  Laterality: N/A;   DECORTICATION Left 02/19/2020   Procedure: DECORTICATION;  Surgeon: Melrose Nakayama, MD;  Location: [facility];  Service: Thoracic;  Laterality: Left;   INTERCOSTAL NERVE BLOCK Left 02/19/2020   Procedure: Intercostal Nerve Block;  Surgeon: Melrose Nakayama, MD;  Location: [facility];  Service: Thoracic;  Laterality: Left;   IR FLUORO GUIDE CV LINE RIGHT  09/13/2017   IR REMOVAL TUN CV CATH W/O FL  10/22/2017   IR US GUIDE VASC ACCESS RIGHT  09/13/2017   LEFT HEART CATH AND CORONARY ANGIOGRAPHY N/A 03/27/2019   Procedure: LEFT HEART CATH AND CORONARY ANGIOGRAPHY;  Surgeon: Jettie Booze, MD;  Location: [facility];  Service: Cardiovascular;  Laterality: N/A;   NECK SURGERY     PLEURAL EFFUSION DRAINAGE Left 02/19/2020   Procedure: DRAINAGE OF PLEURAL EFFUSION;  Surgeon: Melrose Nakayama, MD;  Location: [facility];  Service: Thoracic;  Laterality: Left;   RIGHT HEART CATH AND CORONARY/GRAFT ANGIOGRAPHY N/A 02/09/2023   Procedure: RIGHT HEART CATH AND CORONARY/GRAFT ANGIOGRAPHY;  Surgeon: Jolaine Artist, MD;  Location: [facility];   Service: Cardiovascular;  Laterality: N/A;   SMALL INTESTINE SURGERY     TEE WITHOUT CARDIOVERSION N/A 09/10/2017   Procedure: TRANSESOPHAGEAL ECHOCARDIOGRAM (TEE);  Surgeon: Skeet Latch, MD;  Location: [facility];  Service: Cardiovascular;  Laterality: N/A;   TEE WITHOUT CARDIOVERSION N/A 03/29/2019   Procedure: TRANSESOPHAGEAL ECHOCARDIOGRAM (TEE);  Surgeon: Melrose Nakayama, MD;  Location: [facility];  Service: Open Heart Surgery;  Laterality: N/A;   TEE WITHOUT CARDIOVERSION N/A 01/12/2020   Procedure: TRANSESOPHAGEAL ECHOCARDIOGRAM (TEE);  Surgeon: Sanda Klein, MD;  Location: [facility];  Service: Cardiovascular;  Laterality: N/A;   TEE WITHOUT CARDIOVERSION N/A 02/09/2023   Procedure: TRANSESOPHAGEAL ECHOCARDIOGRAM (TEE);  Surgeon: Jolaine Artist, MD;  Location: [facility];  Service: Cardiovascular;  Laterality: N/A;   VIDEO ASSISTED THORACOSCOPY Left 02/19/2020   Procedure: VIDEO ASSISTED THORACOSCOPY;  Surgeon: Melrose Nakayama, MD;  Location: [facility];  Service: Thoracic;  Laterality: Left;   VIDEO BRONCHOSCOPY N/A 02/19/2020   Procedure: VIDEO BRONCHOSCOPY;  Surgeon: Melrose Nakayama, MD;  Location: [facility];  Service: Thoracic;  Laterality: N/A;     A IV Location/Drains/Wounds Patient Lines/Drains/Airways Status  Active Line/Drains/Airways     Name Placement date Placement time Site Days   Peripheral IV 02/19/23 20 G Left Antecubital 02/19/23  1458  Antecubital  less than 1   Peripheral IV 02/19/23 20 G Anterior;Left Forearm 02/19/23  1459  Forearm  less than 1   Fistula / Graft Right Upper arm Arteriovenous fistula 10/18/18  0838  Upper arm  1585            Intake/Output Last 24 hours  Intake/Output Summary (Last 24 hours) at 02/19/2023 1645 Last data filed at 02/19/2023 1644 Gross per 24 hour  Intake 850 ml  Output --  Net 850 ml    Labs/Imaging Results for orders placed or performed during the hospital encounter of 02/19/23 (from the past  48 hour(s))  Basic metabolic panel     Status: Abnormal   Collection Time: 02/19/23 12:46 PM  Result Value Ref Range   Sodium 135 135 - 145 mmol/L   Potassium 4.9 3.5 - 5.1 mmol/L   Chloride 91 (L) 98 - 111 mmol/L   CO2 25 22 - 32 mmol/L   Glucose, Bld 183 (H) 70 - 99 mg/dL    Comment: Glucose reference range applies only to samples taken after fasting for at least 8 hours.   BUN 65 (H) 8 - 23 mg/dL   Creatinine, Ser 10.46 (H) 0.61 - 1.24 mg/dL   Calcium 8.2 (L) 8.9 - 10.3 mg/dL   GFR, Estimated 5 (L) >60 mL/min    Comment: (NOTE) Calculated using the CKD-EPI Creatinine Equation (2021)    Anion gap 19 (H) 5 - 15    Comment: Performed at [facility] [address] [place], [place] [address]  CBC     Status: Abnormal   Collection Time: 02/19/23 12:46 PM  Result Value Ref Range   WBC 10.1 4.0 - 10.5 K/uL   RBC 3.25 (L) 4.22 - 5.81 MIL/uL   Hemoglobin 10.7 (L) 13.0 - 17.0 g/dL   HCT 33.3 (L) 39.0 - 52.0 %   MCV 102.5 (H) 80.0 - 100.0 fL   MCH 32.9 26.0 - 34.0 pg   MCHC 32.1 30.0 - 36.0 g/dL   RDW 14.5 11.5 - 15.5 %   Platelets 128 (L) 150 - 400 K/uL   nRBC 0.0 0.0 - 0.2 %    Comment: Performed at [facility] [address], [place], [place] [address]  Troponin I (High Sensitivity)     Status: Abnormal   Collection Time: 02/19/23 12:46 PM  Result Value Ref Range   Troponin I (High Sensitivity) 44 (H) <18 ng/L    Comment: (NOTE) Elevated high sensitivity troponin I (hsTnI) values and significant  changes across serial measurements may suggest ACS but many other  chronic and acute conditions are known to elevate hsTnI results.  Refer to the "Links" section for chest pain algorithms and additional  guidance. Performed at [facility], [place] [address], [place], [place] [address]   Troponin I (High Sensitivity)     Status: Abnormal   Collection Time: 02/19/23  3:06 PM  Result Value Ref Range   Troponin I (High Sensitivity) 40 (H) <18 ng/L    Comment:  (NOTE) Elevated high sensitivity troponin I (hsTnI) values and significant  changes across serial measurements may suggest ACS but many other  chronic and acute conditions are known to elevate hsTnI results.  Refer to the "Links" section for chest pain algorithms and additional  guidance. Performed at [facility], [place] [address] [place], [place] [address]   Lactic acid, plasma     Status: None   Collection Time: 02/19/23  3:06 PM  Result Value Ref Range   Lactic Acid, Venous 1.5 0.5 - 1.9 mmol/L    Comment: Performed at [facility] [address], [place], [place] [address]   DG Chest 2 View  Result Date: 02/19/2023 CLINICAL DATA:  Chest pain.  Cough and cold symptoms x3 days. EXAM: CHEST - 2 VIEW COMPARISON:  11/04/2022. FINDINGS: Right middle lobe consolidation. Stable cardiac and mediastinal contours with postoperative changes of median sternotomy and CABG. No pleural effusion or pneumothorax. IMPRESSION: Right middle lobe pneumonia. Electronically Signed   By: Emmit Alexanders M.D.   On: 02/19/2023 13:23    Pending Labs Unresulted Labs (From admission, onward)     Start     Ordered   02/20/23 0500  Comprehensive metabolic panel  Tomorrow morning,   R        02/19/23 1539   02/20/23 0500  CBC  Tomorrow morning,   R        02/19/23 1539   02/19/23 1630  Hepatitis B surface antigen  (New Admission Hemo Labs (Hepatitis B))  Once,   R        02/19/23 1630   02/19/23 1630  Hepatitis B surface antibody,quantitative  (New Admission Hemo Labs (Hepatitis B))  Once,   R        02/19/23 1630   02/19/23 1532  Magnesium  Add-on,   AD        02/19/23 1539   02/19/23 1527  HIV Antibody (routine testing w rflx)  (HIV Antibody (Routine testing w reflex) panel)  Once,   R        02/19/23 1539   02/19/23 1426  Lactic acid, plasma  Now then every 2 hours,   R (with STAT occurrences)      02/19/23 1425   02/19/23 1426  Culture, blood (routine x 2)  BLOOD CULTURE X 2,   R (with STAT  occurrences)      02/19/23 1425            Vitals/Pain Today's Vitals   02/19/23 1430 02/19/23 1445 02/19/23 1514 02/19/23 1515  BP: 112/61 112/74    Pulse: 94 94    Resp: 15 12    Temp:    98.6 F (37 C)  TempSrc:    Oral  SpO2: 92% 93%    PainSc:   0-No pain     Isolation Precautions No active isolations  Medications Medications  aspirin EC tablet 81 mg (has no administration in time range)  atorvastatin (LIPITOR) tablet 40 mg (has no administration in time range)  midodrine (PROAMATINE) tablet 10 mg (has no administration in time range)  pantoprazole (PROTONIX) EC tablet 40 mg (has no administration in time range)  multivitamin (RENA-VIT) tablet 1 tablet (has no administration in time range)  heparin injection 5,000 Units (has no administration in time range)  cefTRIAXone (ROCEPHIN) 2 g in sodium chloride 0.9 % 100 mL IVPB (has no administration in time range)  azithromycin (ZITHROMAX) 500 mg in sodium chloride 0.9 % 250 mL IVPB (has no administration in time range)  sodium chloride flush (NS) 0.9 % injection 3 mL (3 mLs Intravenous Given 02/19/23 1549)  acetaminophen (TYLENOL) tablet 650 mg (has no administration in time range)    Or  acetaminophen (TYLENOL) suppository 650 mg (has no administration in time  range)  polyethylene glycol (MIRALAX / GLYCOLAX) packet 17 g (has no administration in time range)  rOPINIRole (REQUIP) tablet 0.5 mg (has no administration in time range)    And  rOPINIRole (REQUIP) tablet 0.5 mg (has no administration in time range)  sevelamer carbonate (RENVELA) tablet 1,600 mg (has no administration in time range)  acetaminophen (TYLENOL) tablet 1,000 mg (1,000 mg Oral Given 02/19/23 1500)  HYDROcodone bit-homatropine (HYCODAN) 5-1.5 MG/5ML syrup 5 mL (5 mLs Oral Given 02/19/23 1512)  sodium chloride 0.9 % bolus 500 mL (0 mLs Intravenous Stopped 02/19/23 1644)  cefTRIAXone (ROCEPHIN) 1 g in sodium chloride 0.9 % 100 mL IVPB (0 g Intravenous  Stopped 02/19/23 1544)  azithromycin (ZITHROMAX) 500 mg in sodium chloride 0.9 % 250 mL IVPB (0 mg Intravenous Stopped 02/19/23 1638)    Mobility walks with person assist     Focused Assessments     R Recommendations: See Admitting Provider Note  Report given to:   Additional Notes: Pt came in with SOB. He goes to dialysis T, Th, and Sat. He missed his dialysis appts due to not feeling well. He only made one appt this past week. He also came in with a fever and got tylenol for his fever. He temp has since then came back down to normal.  He's being admitted with pneumonia. So far he's gotten zithromax, rocephin, and NaCl. He has a fistula in R upper arm and 20 G IV's in the L AC and forearm. He's A&Ox 4 and ambulatory.

## 2023-02-20 DIAGNOSIS — Z79899 Other long term (current) drug therapy: Secondary | ICD-10-CM | POA: Diagnosis not present

## 2023-02-20 DIAGNOSIS — N186 End stage renal disease: Secondary | ICD-10-CM | POA: Diagnosis not present

## 2023-02-20 DIAGNOSIS — I5022 Chronic systolic (congestive) heart failure: Secondary | ICD-10-CM | POA: Diagnosis not present

## 2023-02-20 DIAGNOSIS — Z992 Dependence on renal dialysis: Secondary | ICD-10-CM

## 2023-02-20 DIAGNOSIS — J181 Lobar pneumonia, unspecified organism: Secondary | ICD-10-CM | POA: Diagnosis not present

## 2023-02-20 DIAGNOSIS — J189 Pneumonia, unspecified organism: Secondary | ICD-10-CM | POA: Diagnosis present

## 2023-02-20 DIAGNOSIS — I9589 Other hypotension: Secondary | ICD-10-CM | POA: Diagnosis present

## 2023-02-20 DIAGNOSIS — E1151 Type 2 diabetes mellitus with diabetic peripheral angiopathy without gangrene: Secondary | ICD-10-CM | POA: Diagnosis present

## 2023-02-20 DIAGNOSIS — R109 Unspecified abdominal pain: Secondary | ICD-10-CM | POA: Diagnosis not present

## 2023-02-20 DIAGNOSIS — E785 Hyperlipidemia, unspecified: Secondary | ICD-10-CM | POA: Diagnosis present

## 2023-02-20 DIAGNOSIS — E1122 Type 2 diabetes mellitus with diabetic chronic kidney disease: Secondary | ICD-10-CM | POA: Diagnosis present

## 2023-02-20 DIAGNOSIS — I5082 Biventricular heart failure: Secondary | ICD-10-CM | POA: Diagnosis present

## 2023-02-20 DIAGNOSIS — I472 Ventricular tachycardia, unspecified: Secondary | ICD-10-CM | POA: Diagnosis not present

## 2023-02-20 DIAGNOSIS — Z87891 Personal history of nicotine dependence: Secondary | ICD-10-CM | POA: Diagnosis not present

## 2023-02-20 DIAGNOSIS — Z1152 Encounter for screening for COVID-19: Secondary | ICD-10-CM | POA: Diagnosis not present

## 2023-02-20 DIAGNOSIS — J9601 Acute respiratory failure with hypoxia: Secondary | ICD-10-CM | POA: Diagnosis not present

## 2023-02-20 DIAGNOSIS — I6529 Occlusion and stenosis of unspecified carotid artery: Secondary | ICD-10-CM | POA: Diagnosis present

## 2023-02-20 DIAGNOSIS — Z8616 Personal history of COVID-19: Secondary | ICD-10-CM | POA: Diagnosis not present

## 2023-02-20 DIAGNOSIS — N400 Enlarged prostate without lower urinary tract symptoms: Secondary | ICD-10-CM | POA: Diagnosis present

## 2023-02-20 DIAGNOSIS — I5023 Acute on chronic systolic (congestive) heart failure: Secondary | ICD-10-CM | POA: Diagnosis not present

## 2023-02-20 DIAGNOSIS — D631 Anemia in chronic kidney disease: Secondary | ICD-10-CM | POA: Diagnosis present

## 2023-02-20 DIAGNOSIS — N2581 Secondary hyperparathyroidism of renal origin: Secondary | ICD-10-CM | POA: Diagnosis present

## 2023-02-20 DIAGNOSIS — G2581 Restless legs syndrome: Secondary | ICD-10-CM | POA: Diagnosis present

## 2023-02-20 DIAGNOSIS — I340 Nonrheumatic mitral (valve) insufficiency: Secondary | ICD-10-CM | POA: Diagnosis present

## 2023-02-20 DIAGNOSIS — J129 Viral pneumonia, unspecified: Secondary | ICD-10-CM | POA: Diagnosis present

## 2023-02-20 DIAGNOSIS — E860 Dehydration: Secondary | ICD-10-CM | POA: Diagnosis present

## 2023-02-20 DIAGNOSIS — I132 Hypertensive heart and chronic kidney disease with heart failure and with stage 5 chronic kidney disease, or end stage renal disease: Secondary | ICD-10-CM | POA: Diagnosis present

## 2023-02-20 LAB — CBC
HCT: 32.5 % — ABNORMAL LOW (ref 39.0–52.0)
Hemoglobin: 10.9 g/dL — ABNORMAL LOW (ref 13.0–17.0)
MCH: 33.5 pg (ref 26.0–34.0)
MCHC: 33.5 g/dL (ref 30.0–36.0)
MCV: 100 fL (ref 80.0–100.0)
Platelets: 108 10*3/uL — ABNORMAL LOW (ref 150–400)
RBC: 3.25 MIL/uL — ABNORMAL LOW (ref 4.22–5.81)
RDW: 14.5 % (ref 11.5–15.5)
WBC: 8.2 10*3/uL (ref 4.0–10.5)
nRBC: 0 % (ref 0.0–0.2)

## 2023-02-20 LAB — COMPREHENSIVE METABOLIC PANEL
ALT: 25 U/L (ref 0–44)
AST: 24 U/L (ref 15–41)
Albumin: 2.9 g/dL — ABNORMAL LOW (ref 3.5–5.0)
Alkaline Phosphatase: 117 U/L (ref 38–126)
Anion gap: 19 — ABNORMAL HIGH (ref 5–15)
BUN: 72 mg/dL — ABNORMAL HIGH (ref 8–23)
CO2: 21 mmol/L — ABNORMAL LOW (ref 22–32)
Calcium: 8.2 mg/dL — ABNORMAL LOW (ref 8.9–10.3)
Chloride: 94 mmol/L — ABNORMAL LOW (ref 98–111)
Creatinine, Ser: 10.91 mg/dL — ABNORMAL HIGH (ref 0.61–1.24)
GFR, Estimated: 5 mL/min — ABNORMAL LOW (ref >60)
Glucose, Bld: 178 mg/dL — ABNORMAL HIGH (ref 70–99)
Potassium: 4.8 mmol/L (ref 3.5–5.1)
Sodium: 134 mmol/L — ABNORMAL LOW (ref 135–145)
Total Bilirubin: 1.1 mg/dL (ref 0.3–1.2)
Total Protein: 6.1 g/dL — ABNORMAL LOW (ref 6.5–8.1)

## 2023-02-20 LAB — RESPIRATORY PANEL BY PCR

## 2023-02-20 LAB — PHOSPHORUS: Phosphorus: 8.4 mg/dL — ABNORMAL HIGH (ref 2.5–4.6)

## 2023-02-20 LAB — SARS CORONAVIRUS 2 BY RT PCR: SARS Coronavirus 2 by RT PCR: NEGATIVE

## 2023-02-20 LAB — MAGNESIUM: Magnesium: 2.4 mg/dL (ref 1.7–2.4)

## 2023-02-20 LAB — MRSA NEXT GEN BY PCR, NASAL: MRSA by PCR Next Gen: NOT DETECTED

## 2023-02-20 LAB — STREP PNEUMONIAE URINARY ANTIGEN: Strep Pneumo Urinary Antigen: NEGATIVE

## 2023-02-20 MED ORDER — HYDROCODONE BIT-HOMATROP MBR 5-1.5 MG/5ML PO SOLN
5.0000 mL | Freq: Four times a day (QID) | ORAL | Status: AC | PRN
  Administered 2023-02-20 (×2): 5 mL via ORAL
  Filled 2023-02-20 (×2): qty 5

## 2023-02-20 MED ORDER — LIDOCAINE-PRILOCAINE 2.5-2.5 % EX CREA: 1.0000 | TOPICAL_CREAM | CUTANEOUS | Status: DC | PRN

## 2023-02-20 MED ORDER — GUAIFENESIN ER 600 MG PO TB12
1200.0000 mg | ORAL_TABLET | Freq: Two times a day (BID) | ORAL | Status: DC
  Administered 2023-02-20 – 2023-02-24 (×8): 1200 mg via ORAL
  Filled 2023-02-20 (×8): qty 2

## 2023-02-20 MED ORDER — PENTAFLUOROPROP-TETRAFLUOROETH EX AERO: 1.0000 | INHALATION_SPRAY | CUTANEOUS | Status: DC | PRN

## 2023-02-20 MED ORDER — IPRATROPIUM-ALBUTEROL 0.5-2.5 (3) MG/3ML IN SOLN
3.0000 mL | Freq: Two times a day (BID) | RESPIRATORY_TRACT | Status: DC
  Administered 2023-02-20: 3 mL via RESPIRATORY_TRACT
  Filled 2023-02-20 (×2): qty 3

## 2023-02-20 MED ORDER — TAMSULOSIN HCL 0.4 MG PO CAPS
0.4000 mg | ORAL_CAPSULE | Freq: Every day | ORAL | Status: DC
  Administered 2023-02-21 – 2023-02-24 (×4): 0.4 mg via ORAL
  Filled 2023-02-20 (×4): qty 1

## 2023-02-20 MED ORDER — HYDROCODONE BIT-HOMATROP MBR 5-1.5 MG/5ML PO SOLN
5.0000 mL | Freq: Four times a day (QID) | ORAL | Status: AC | PRN
  Administered 2023-02-20 – 2023-02-22 (×4): 5 mL via ORAL
  Filled 2023-02-20 (×5): qty 5

## 2023-02-20 MED ORDER — GUAIFENESIN ER 600 MG PO TB12
600.0000 mg | ORAL_TABLET | Freq: Two times a day (BID) | ORAL | Status: DC
  Administered 2023-02-20: 600 mg via ORAL
  Filled 2023-02-20: qty 1

## 2023-02-20 MED ORDER — IPRATROPIUM-ALBUTEROL 0.5-2.5 (3) MG/3ML IN SOLN: 3.0000 mL | Freq: Two times a day (BID) | RESPIRATORY_TRACT | Status: DC

## 2023-02-20 MED ORDER — LIDOCAINE HCL (PF) 1 % IJ SOLN: 5.0000 mL | INTRAMUSCULAR | Status: DC | PRN

## 2023-02-20 MED ORDER — CALCITRIOL 0.5 MCG PO CAPS
1.0000 ug | ORAL_CAPSULE | ORAL | Status: DC
  Administered 2023-02-20: 1 ug via ORAL
  Filled 2023-02-20 (×3): qty 2

## 2023-02-20 NOTE — Progress Notes (Signed)
PROGRESS NOTE    John [name]  [id number] DOB: [birth year] DOA: 02/19/2023 PCP: Rhea Bleacher, NP     Brief Narrative:   H/o bacterial endocarditis in 2018, PAD s/p aortofemoral bypass ,h/o CAD status post CABG in 2020, h/o left pleural effusion status post bronc with VATS and decortication in 01/2020, ESRD, started on HD in [id number] , chronic systolic chf , volume managed by HD, presented to the ED due to short of breath and weakness, He also states that he only went to 1 of his 3 dialysis this week due to not feeling well, Pt states that he went to an UC in [place] & an xray showed fluid (per pt) ,on arrival to the ED he is febrile, Temp was 102.2, cxr showed RML PNA   Subjective:  Fever resolved , has one episode of watery diarrhea last night, denies ab pain, felt queasy , but better after eat something Reports productive cough, fever, wife was coughing as well No edema  Assessment & Plan:  Principal Problem:   CAP (community acquired pneumonia) Active Problems:   Hyperlipidemia, unspecified   S/P CABG x 4   S/P Video Bronchoscopy, Right VATS with drainage of pleural effusion, decortication, intercostal nerve block   ESRD (end stage renal disease) ([place])   Hypertension   Type 2 diabetes mellitus with diabetic nephropathy (H[facility])   Carotid artery stenosis   Anemia in chronic kidney disease   Restless legs syndrome   Lobar pneumonia ([place])    Assessment and Plan:  RML PNA CT chest from 3/13 did not show any infiltrate Continue Rocephin and Zithromax, monitor oxygen requirement Reports wife also is coughing, will check viral panel  Watery diarrhea Check urine Legionella antigen Stool studies  ESRD: /anemia of chronic disease  RUE AVF  Dialyzes on TTS schedule at [facility] unit.  Appreciate nephrology input, has HD today on 3/30  NIDDM2 Check A1c, hold home meds  CAD s/p CABG, Continue home meds asa, lipitor Chronic systolic CHF, TEE 10 days ago with EF  less than 20%, global hypokinesis, severely reduced RV function.  Does not appear volume overloaded on exam, volume managed by dialysis   Stable appearance of ascending thoracic aortic aneurysm measuring 4.2 cm. F/u with thoracic surgery Dr. Roxan Hockey  h/o left pleural effusion status post bronc with VATS and decortication in 01/2020 CT chest from 3/13 "stable appearance of pleural thickening and parenchymal scarring within the left lung with secondary volume loss.      I have Reviewed nursing notes, Vitals, pain scores, I/o's, Lab results and  imaging results since pt's last encounter, details please see discussion above  I ordered the following labs:  Unresulted Labs (From admission, onward)     Start     Ordered   02/21/23 0500  Hemoglobin A1c  Tomorrow morning,   R        02/20/23 1650   02/21/23 0500  CBC with Differential/Platelet  Daily,   R      02/20/23 1650   02/21/23 [id number]  Basic metabolic panel  Tomorrow morning,   R        02/20/23 1650   02/20/23 1437  MRSA Next Gen by PCR, Nasal  Once,   R        02/20/23 1436   02/20/23 1437  SARS Coronavirus 2 by RT PCR (hospital order, performed in [place] hospital lab) *cepheid single result test* Anterior Nasal Swab  (Tier 2 - SARS Coronavirus 2 by RT PCR (  hospital order, performed in [facility] hospital lab) *cepheid single result test*)  Once,   R        02/20/23 1437   02/20/23 1436  Respiratory (~20 pathogens) panel by PCR  (Respiratory panel by PCR (~20 pathogens, ~24 hr TAT)  w precautions)  Once,   R        02/20/23 1436   02/20/23 1432  Strep pneumoniae urinary antigen  Once,   R        02/20/23 1431   02/20/23 1432  Legionella Pneumophila Serogp 1 Ur Ag  Once,   R        02/20/23 1431   02/20/23 1431  Gastrointestinal Panel by PCR , Stool  (Gastrointestinal Panel by PCR, Stool                                                                                                                                                      **Does Not include CLOSTRIDIUM DIFFICILE testing. **If CDIFF testing is needed, place order from the "C Difficile Testing" order set.**)  Once,   R        02/20/23 1430   02/20/23 1431  Expectorated Sputum Assessment w Gram Stain, Rflx to Resp Cult  Once,   R        02/20/23 1430   02/20/23 1430  C Difficile Quick Screen w PCR reflex  (C Difficile quick screen w PCR reflex panel )  Once, for 24 hours,   TIMED       References:    CDiff Information Tool   02/20/23 1430   02/19/23 1630  Hepatitis B surface antibody,quantitative  (New Admission Hemo Labs (Hepatitis B))  Once,   R        02/19/23 1630             DVT prophylaxis: heparin injection 5,000 Units Start: 02/19/23 2200   Code Status:   Code Status: Full Code  Family Communication: Patient Disposition:    Dispo: The patient is from: home , lives with wife ( wife has knee surgery)              Anticipated d/c is to: home              Anticipated d/c date is: >24hrs  Antimicrobials:   Anti-infectives (From admission, onward)    Start     Dose/Rate Route Frequency Ordered Stop   02/20/23 1400  cefTRIAXone (ROCEPHIN) 2 g in sodium chloride 0.9 % 100 mL IVPB        2 g 200 mL/hr over 30 Minutes Intravenous Every 24 hours 02/19/23 1539 02/24/23 1359   02/20/23 1400  azithromycin (ZITHROMAX) 500 mg in sodium chloride 0.9 % 250 mL IVPB        500 mg 250 mL/hr over 60 Minutes Intravenous  Every 24 hours 02/19/23 1539 02/24/23 1359   02/19/23 1430  cefTRIAXone (ROCEPHIN) 1 g in sodium chloride 0.9 % 100 mL IVPB        1 g 200 mL/hr over 30 Minutes Intravenous  Once 02/19/23 1425 02/19/23 1544   02/19/23 1430  azithromycin (ZITHROMAX) 500 mg in sodium chloride 0.9 % 250 mL IVPB        500 mg 250 mL/hr over 60 Minutes Intravenous  Once 02/19/23 1425 02/19/23 1638           Objective: Vitals:   02/20/23 1211 02/20/23 1227 02/20/23 1610 02/20/23 1637  BP: 132/80 136/77 110/62   Pulse: 82 82 93   Resp: (!) 22 19  16    Temp:   98.5 F (36.9 C)   TempSrc:   Oral   SpO2: 100% 100% 97% 98%  Weight:        Intake/Output Summary (Last 24 hours) at 02/20/2023 1655 Last data filed at 02/20/2023 1500 Gross per 24 hour  Intake 548.79 ml  Output 2525 ml  Net -1976.21 ml   Filed Weights   02/20/23 0823  Weight: 92.8 kg    Examination:  General exam: chronically ill appearing, weak, aaox3,  NAD Respiratory system: diminished on the left side, with some wheezing on left side, right side actually sound better than left. Respiratory effort normal. Cardiovascular system:  RRR.  Gastrointestinal system: Abdomen is nondistended, soft and nontender.  Normal bowel sounds heard. Central nervous system: Alert and oriented. No focal neurological deficits. Extremities:  no edema Skin: No rashes, lesions or ulcers Psychiatry: Judgement and insight appear normal. Mood & affect appropriate.     Data Reviewed: I have personally reviewed  labs and visualized  imaging studies since the last encounter and formulate the plan        Scheduled Meds:  aspirin EC  81 mg Oral Daily   atorvastatin  40 mg Oral QHS   calcitRIOL  1 mcg Oral Once per day on Tue Thu Sat   gabapentin  200 mg Oral QHS   guaiFENesin  1,200 mg Oral BID   heparin  5,000 Units Subcutaneous Q8H   ipratropium-albuterol  3 mL Nebulization BID   midodrine  10 mg Oral Q T,Th,Sat-1800   multivitamin  1 tablet Oral QHS   pantoprazole  40 mg Oral Daily   rOPINIRole  0.5 mg Oral QHS   And   rOPINIRole  0.5 mg Oral Q T,Th,Sat-1800   sevelamer carbonate  1,600 mg Oral TID WC   sodium chloride flush  3 mL Intravenous Q12H   [START ON 02/21/2023] tamsulosin  0.4 mg Oral Daily   zolpidem  5 mg Oral QHS   Continuous Infusions:  azithromycin 250 mL/hr at 02/20/23 1500   cefTRIAXone (ROCEPHIN)  IV Stopped (02/20/23 1402)     LOS: 0 days   Time spent:  56mins  Florencia Reasons, MD PhD FACP [facility]  Available via Epic secure chat 7am-7pm  for nonurgent issues Please page for urgent issues To page the attending provider between 7A-7P or the covering provider during after hours 7P-7A, please log into the web site [email or web address] and access using universal [place] password for that web site. If you do not have the password, please call the hospital operator.    02/20/2023, 4:55 PM

## 2023-02-20 NOTE — Procedures (Signed)
HD Note:  Some information was entered later than the data was gathered due to patient care needs. The stated time with the data is accurate.  Received patient in bed to unit.  Alert and oriented.  Informed consent signed and in chart.   TX duration:3.5  2L of O2 started due to sat dropping into the 80s periodically. Patient's coughing was exhausting to him.  He continued to complain of the pain related to the coughing.  The venous pressures would rise and stop treatment momentarily.  Transported back to the room  Alert, without acute distress.  Hand-off given to patient's nurse.   Access used: Right upper arm fistula Access issues: No isssues  Total UF removed: 2500 ml   [facility]

## 2023-02-20 NOTE — Progress Notes (Signed)
[facility] NP was notified that Cardiologist is Dr. Glori Bickers and last note in chart was 3/19

## 2023-02-20 NOTE — Progress Notes (Signed)
A/[facility] NP [facility] notified that patient had over 20 beats Vach bp 84/52 HR 72 Manual  bp 102/61. Asymptomatic was sleep.

## 2023-02-20 NOTE — Plan of Care (Signed)
  Problem: Activity: Goal: Ability to return to baseline activity level will improve Outcome: Progressing   Problem: Cardiovascular: Goal: Ability to achieve and maintain adequate cardiovascular perfusion will improve Outcome: Progressing Goal: Vascular access site(s) Level 0-1 will be maintained Outcome: Progressing   Problem: Health Behavior/Discharge Planning: Goal: Ability to safely manage health-related needs after discharge will improve Outcome: Progressing   Problem: Activity: Goal: Ability to tolerate increased activity will improve Outcome: Progressing   Problem: Clinical Measurements: Goal: Ability to maintain a body temperature in the normal range will improve Outcome: Progressing   Problem: Respiratory: Goal: Ability to maintain adequate ventilation will improve Outcome: Progressing Goal: Ability to maintain a clear airway will improve Outcome: Progressing   Problem: Respiratory: Goal: Ability to maintain adequate ventilation will improve Outcome: Progressing Goal: Ability to maintain a clear airway will improve Outcome: Progressing   Problem: Education: Goal: Knowledge of General Education information will improve Description: Including pain rating scale, medication(s)/side effects and non-pharmacologic comfort measures Outcome: Progressing   Problem: Health Behavior/Discharge Planning: Goal: Ability to manage health-related needs will improve Outcome: Progressing   Problem: Clinical Measurements: Goal: Ability to maintain clinical measurements within normal limits will improve Outcome: Progressing Goal: Will remain free from infection Outcome: Progressing Goal: Diagnostic test results will improve Outcome: Progressing Goal: Respiratory complications will improve Outcome: Progressing Goal: Cardiovascular complication will be avoided Outcome: Progressing   Problem: Activity: Goal: Risk for activity intolerance will decrease Outcome: Progressing    Problem: Nutrition: Goal: Adequate nutrition will be maintained Outcome: Progressing   Problem: Coping: Goal: Level of anxiety will decrease Outcome: Progressing   Problem: Elimination: Goal: Will not experience complications related to bowel motility Outcome: Progressing Goal: Will not experience complications related to urinary retention Outcome: Progressing   Problem: Pain Managment: Goal: General experience of comfort will improve Outcome: Progressing   Problem: Safety: Goal: Ability to remain free from injury will improve Outcome: Progressing

## 2023-02-20 NOTE — Progress Notes (Signed)
A Chavaz informed via chat that EKG done and manual bp done remained the same.

## 2023-02-20 NOTE — Progress Notes (Signed)
[facility] Abigail Butts NP was notified that patient is having a bad cought that is causing chest pain and head ache . He was given Hyodan 5 mls in ED for his cough

## 2023-02-20 NOTE — Progress Notes (Signed)
  [facility] Progress Note   Subjective:   Seen on HD - 2.5L UFG and tolerating. Reports a lot of coughing overnight and looks like had run of NSVT per notes. He was asymptomatic for it. No CP/dyspnea now.   Objective Vitals:   02/20/23 0407 02/20/23 0452 02/20/23 0748 02/20/23 0823  BP: 102/61 102/61 116/70 110/62  Pulse:   81 83  Resp:   16 20  Temp:   97.9 F (36.6 C) 98.7 F (37.1 C)  TempSrc:   Oral   SpO2:   97% 97%  Weight:    92.8 kg   Physical Exam General: Well appearing man, NAD Heart: RRR; no murmur Lungs: CTA anteriorly Abdomen: soft Extremities: no LE edema Dialysis Access: RUE AVF + bruit  Additional Objective Labs: Basic Metabolic Panel: Recent Labs  Lab 02/19/23 1246 02/20/23 0332  NA 135 134*  K 4.9 4.8  CL 91* 94*  CO2 25 21*  GLUCOSE 183* 178*  BUN 65* 72*  CREATININE 10.46* 10.91*  CALCIUM 8.2* 8.2*  PHOS  --  8.4*   Liver Function Tests: Recent Labs  Lab 02/20/23 0332  AST 24  ALT 25  ALKPHOS 117  BILITOT 1.1  PROT 6.1*  ALBUMIN 2.9*   CBC: Recent Labs  Lab 02/19/23 1246 02/20/23 0332  WBC 10.1 8.2  HGB 10.7* 10.9*  HCT 33.3* 32.5*  MCV 102.5* 100.0  PLT 128* 108*   Studies/Results: DG Chest 2 View  Result Date: 02/19/2023 CLINICAL DATA:  Chest pain.  Cough and cold symptoms x3 days. EXAM: CHEST - 2 VIEW COMPARISON:  11/04/2022. FINDINGS: Right middle lobe consolidation. Stable cardiac and mediastinal contours with postoperative changes of median sternotomy and CABG. No pleural effusion or pneumothorax. IMPRESSION: Right middle lobe pneumonia. Electronically Signed   By: Emmit Alexanders M.D.   On: 02/19/2023 13:23    Medications:  azithromycin     cefTRIAXone (ROCEPHIN)  IV      aspirin EC  81 mg Oral Daily   atorvastatin  40 mg Oral QHS   gabapentin  200 mg Oral QHS   heparin  5,000 Units Subcutaneous Q8H   midodrine  10 mg Oral Q T,Th,Sat-1800   multivitamin  1 tablet Oral QHS   pantoprazole  40 mg  Oral Daily   rOPINIRole  0.5 mg Oral QHS   And   rOPINIRole  0.5 mg Oral Q T,Th,Sat-1800   sevelamer carbonate  1,600 mg Oral TID WC   sodium chloride flush  3 mL Intravenous Q12H   zolpidem  5 mg Oral QHS    Dialysis Orders: TTS at [facility] - last HD 3/26 (left 89.2kg) 4hr, 400/A1.5, EDW 88.5kg, 2K/2Ca, UFP #2, RUE AVF, no heparin - no ESA - calcitriol 20mcg PO q HD   Assessment/Plan:  RML pneumonia: Started on Ceftriaxone + azithromycin. Nasal O2 in place.  ESRD: Usual TTS schedule - HD today, 2.5L UFG.  Hypertension/volume: BP controlled, no LE edema. No overt pulm edema on CXR.  Anemia: Hgb 10.9 - trend for now, does not get ESA as outpatient.  Metabolic bone disease: Ca ok, Phos high. Continue home binders + VDRA. CAD, Hx CABG T2DM  Run of VT 3/29: Asymptomatic. Per primary.  Veneta Penton, PA-C 02/20/2023, 8:34 AM  [employer]

## 2023-02-21 ENCOUNTER — Inpatient Hospital Stay (HOSPITAL_COMMUNITY): Payer: Medicare HMO

## 2023-02-21 DIAGNOSIS — Z992 Dependence on renal dialysis: Secondary | ICD-10-CM | POA: Diagnosis not present

## 2023-02-21 DIAGNOSIS — J181 Lobar pneumonia, unspecified organism: Secondary | ICD-10-CM | POA: Diagnosis not present

## 2023-02-21 DIAGNOSIS — N186 End stage renal disease: Secondary | ICD-10-CM | POA: Diagnosis not present

## 2023-02-21 DIAGNOSIS — I5022 Chronic systolic (congestive) heart failure: Secondary | ICD-10-CM | POA: Diagnosis not present

## 2023-02-21 LAB — BASIC METABOLIC PANEL
Anion gap: 18 — ABNORMAL HIGH (ref 5–15)
BUN: 48 mg/dL — ABNORMAL HIGH (ref 8–23)
CO2: 24 mmol/L (ref 22–32)
Calcium: 8.6 mg/dL — ABNORMAL LOW (ref 8.9–10.3)
Chloride: 91 mmol/L — ABNORMAL LOW (ref 98–111)
Creatinine, Ser: 7.82 mg/dL — ABNORMAL HIGH (ref 0.61–1.24)
GFR, Estimated: 7 mL/min — ABNORMAL LOW (ref >60)
Glucose, Bld: 169 mg/dL — ABNORMAL HIGH (ref 70–99)
Potassium: 4.7 mmol/L (ref 3.5–5.1)
Sodium: 133 mmol/L — ABNORMAL LOW (ref 135–145)

## 2023-02-21 LAB — GLUCOSE, CAPILLARY
Glucose-Capillary: 290 mg/dL — ABNORMAL HIGH (ref 70–99)
Glucose-Capillary: 86 mg/dL (ref 70–99)

## 2023-02-21 LAB — CBC WITH DIFFERENTIAL/PLATELET
Abs Immature Granulocytes: 0.04 10*3/uL (ref 0.00–0.07)
Basophils Absolute: 0 10*3/uL (ref 0.0–0.1)
Basophils Relative: 0 %
Eosinophils Absolute: 0 10*3/uL (ref 0.0–0.5)
Eosinophils Relative: 0 %
HCT: 31.9 % — ABNORMAL LOW (ref 39.0–52.0)
Hemoglobin: 10.8 g/dL — ABNORMAL LOW (ref 13.0–17.0)
Immature Granulocytes: 0 %
Lymphocytes Relative: 13 %
Lymphs Abs: 1.2 10*3/uL (ref 0.7–4.0)
MCH: 33.9 pg (ref 26.0–34.0)
MCHC: 33.9 g/dL (ref 30.0–36.0)
MCV: 100 fL (ref 80.0–100.0)
Monocytes Absolute: 0.8 10*3/uL (ref 0.1–1.0)
Monocytes Relative: 9 %
Neutro Abs: 6.8 10*3/uL (ref 1.7–7.7)
Neutrophils Relative %: 78 %
Platelets: 125 10*3/uL — ABNORMAL LOW (ref 150–400)
RBC: 3.19 MIL/uL — ABNORMAL LOW (ref 4.22–5.81)
RDW: 14.5 % (ref 11.5–15.5)
WBC: 8.9 10*3/uL (ref 4.0–10.5)
nRBC: 0 % (ref 0.0–0.2)

## 2023-02-21 MED ORDER — INSULIN ASPART 100 UNIT/ML IJ SOLN
0.0000 [IU] | Freq: Three times a day (TID) | INTRAMUSCULAR | Status: DC
  Administered 2023-02-21: 5 [IU] via SUBCUTANEOUS
  Administered 2023-02-22: 3 [IU] via SUBCUTANEOUS
  Administered 2023-02-22: 2 [IU] via SUBCUTANEOUS
  Administered 2023-02-22: 5 [IU] via SUBCUTANEOUS
  Administered 2023-02-23 (×2): 2 [IU] via SUBCUTANEOUS
  Administered 2023-02-24: 5 [IU] via SUBCUTANEOUS

## 2023-02-21 MED ORDER — IPRATROPIUM-ALBUTEROL 0.5-2.5 (3) MG/3ML IN SOLN: 3.0000 mL | RESPIRATORY_TRACT | Status: DC | PRN

## 2023-02-21 MED ORDER — PROSOURCE PLUS PO LIQD
30.0000 mL | Freq: Two times a day (BID) | ORAL | Status: DC
  Administered 2023-02-21 – 2023-02-24 (×6): 30 mL via ORAL
  Filled 2023-02-21 (×5): qty 30

## 2023-02-21 NOTE — Progress Notes (Addendum)
PROGRESS NOTE    John [name]  [id number] DOB: 07/16/1951 DOA: 02/19/2023 PCP: Rhea Bleacher, NP     Brief Narrative:   H/o bacterial endocarditis in 2018, PAD s/p aortofemoral bypass ,h/o CAD status post CABG in 2020, h/o left pleural effusion status post bronc with VATS and decortication in 01/2020, ESRD, started on HD in [id number] , chronic systolic chf , volume managed by HD, presented to the ED due to short of breath and weakness, He also states that he only went to 1 of his 3 dialysis this week due to not feeling well, Pt states that he went to an UC in [place] & an xray showed fluid (per pt) ,on arrival to the ED he is febrile, Temp was 102.2, cxr showed RML PNA   Subjective:   He is upset about the renal /carb modified diet , states " he would leave AMA for food." Per RN reports, he requested regular diet   Fever resolved , has one episode of watery diarrhea  on admission, none since, denies ab pain  Continue to have  productive cough with greenish sputum ( per RN report), sputum culture pending collection, 02 sats 88% when walking with PT  Reports sick contact, grandchild had fever, wife was coughing as well No edema  Assessment & Plan:  Principal Problem:   CAP (community acquired pneumonia) Active Problems:   Hyperlipidemia, unspecified   S/P CABG x 4   S/P Video Bronchoscopy, Right VATS with drainage of pleural effusion, decortication, intercostal nerve block   ESRD (end stage renal disease) ([place])   Hypertension   Type 2 diabetes mellitus with diabetic nephropathy ([place])   Carotid artery stenosis   Anemia in chronic kidney disease   Restless legs syndrome   Lobar pneumonia ([place])    Assessment and Plan:  RML PNA/acute hypoxic respiratory failure + sick contact ,+ common cold virus ( coronavirus NL63) Blood culture no growth, Continue Rocephin and Zithromax, monitor oxygen requirement  Pm addendum: C/o abdominal hernia pain, mild tender on exam,  but no acute abdomen, probably from all the coughing, kub no acute findings, f/u with CT ab/pel with oral contrast Offered abdominal binder    Watery diarrheax1 Check urine Legionella antigen Can cancel Stool studies if no more diarrhea   ESRD: /anemia of chronic disease  RUE AVF  Dialyzes on TTS schedule at [facility] unit.  Appreciate nephrology input, has HD today on 3/30  NIDDM2 Check A1c, hold home meds He wants regular diet, start ssi  CAD s/p CABG, Continue home meds asa, lipitor Chronic systolic CHF, TEE 10 days ago with EF less than 20%, global hypokinesis, severely reduced RV function.  Does not appear volume overloaded on exam, volume managed by dialysis   Stable appearance of ascending thoracic aortic aneurysm measuring 4.2 cm. F/u with thoracic surgery Dr. Roxan Hockey  h/o left pleural effusion status post bronc with VATS and decortication in 01/2020 CT chest from 3/13 "stable appearance of pleural thickening and parenchymal scarring within the left lung with secondary volume loss.      I have Reviewed nursing notes, Vitals, pain scores, I/o's, Lab results and  imaging results since pt's last encounter, details please see discussion above  I ordered the following labs:  Unresulted Labs (From admission, onward)     Start     Ordered   02/21/23 0500  Hemoglobin A1c  Tomorrow morning,   R        02/20/23 1650   02/21/23 0500  CBC with Differential/Platelet  Daily,   R      02/20/23 1650   02/20/23 1432  Legionella Pneumophila Serogp 1 Ur Ag  Once,   R        02/20/23 1431   02/20/23 1431  Gastrointestinal Panel by PCR , Stool  (Gastrointestinal Panel by PCR, Stool                                                                                                                                                     **Does Not include CLOSTRIDIUM DIFFICILE testing. **If CDIFF testing is needed, place order from the "C Difficile Testing" order set.**)  Once,   R         02/20/23 1430   02/20/23 1430  C Difficile Quick Screen w PCR reflex  (C Difficile quick screen w PCR reflex panel )  Once, for 24 hours,   TIMED       References:    CDiff Information Tool   02/20/23 1430   02/19/23 1630  Hepatitis B surface antibody,quantitative  (New Admission Hemo Labs (Hepatitis B))  Once,   R        02/19/23 1630             DVT prophylaxis: heparin injection 5,000 Units Start: 02/19/23 2200   Code Status:   Code Status: Full Code  Family Communication: Patient Disposition:    Dispo: The patient is from: home , lives with wife ( wife has knee surgery)              Anticipated d/c is to: home              Anticipated d/c date is: Discharge home on 4/1 or 4/2 pending symptom improvement, need home O2 eval prior to discharge  Antimicrobials:   Anti-infectives (From admission, onward)    Start     Dose/Rate Route Frequency Ordered Stop   02/20/23 1400  cefTRIAXone (ROCEPHIN) 2 g in sodium chloride 0.9 % 100 mL IVPB        2 g 200 mL/hr over 30 Minutes Intravenous Every 24 hours 02/19/23 1539 02/24/23 1359   02/20/23 1400  azithromycin (ZITHROMAX) 500 mg in sodium chloride 0.9 % 250 mL IVPB        500 mg 250 mL/hr over 60 Minutes Intravenous Every 24 hours 02/19/23 1539 02/24/23 1359   02/19/23 1430  cefTRIAXone (ROCEPHIN) 1 g in sodium chloride 0.9 % 100 mL IVPB        1 g 200 mL/hr over 30 Minutes Intravenous  Once 02/19/23 1425 02/19/23 1544   02/19/23 1430  azithromycin (ZITHROMAX) 500 mg in sodium chloride 0.9 % 250 mL IVPB        500 mg 250 mL/hr over 60 Minutes Intravenous  Once 02/19/23 1425 02/19/23 1638  Objective: Vitals:   02/20/23 1958 02/21/23 0546 02/21/23 0805 02/21/23 1200  BP: 120/61 107/72 111/77   Pulse: 89 83 86   Resp: 18 18 18    Temp: 98.7 F (37.1 C) 98.4 F (36.9 C) 98 F (36.7 C)   TempSrc: Oral Oral Oral   SpO2: 92% 91% 95% (S) (!) 88%  Weight:  88.6 kg      Intake/Output Summary (Last 24 hours) at  02/21/2023 1258 Last data filed at 02/21/2023 1028 Gross per 24 hour  Intake 671.79 ml  Output 25 ml  Net 646.79 ml   Filed Weights   02/20/23 0823 02/21/23 0546  Weight: 92.8 kg 88.6 kg    Examination:  General exam: chronically ill appearing, weak, aaox3,  NAD Respiratory system: diminished on the left side, with some wheezing on left side, right side actually sound better than left. Respiratory effort normal. Cardiovascular system:  RRR.  Gastrointestinal system: Abdomen is nondistended, soft and nontender.  Normal bowel sounds heard. Central nervous system: Alert and oriented. No focal neurological deficits. Extremities:  no edema Skin: No rashes, lesions or ulcers Psychiatry: Judgement and insight appear normal. Mood & affect appropriate.     Data Reviewed: I have personally reviewed  labs and visualized  imaging studies since the last encounter and formulate the plan        Scheduled Meds:  (feeding supplement) PROSource Plus  30 mL Oral BID BM   aspirin EC  81 mg Oral Daily   atorvastatin  40 mg Oral QHS   calcitRIOL  1 mcg Oral Once per day on Tue Thu Sat   gabapentin  200 mg Oral QHS   guaiFENesin  1,200 mg Oral BID   heparin  5,000 Units Subcutaneous Q8H   insulin aspart  0-9 Units Subcutaneous TID WC   midodrine  10 mg Oral Q T,Th,Sat-1800   multivitamin  1 tablet Oral QHS   pantoprazole  40 mg Oral Daily   rOPINIRole  0.5 mg Oral QHS   And   rOPINIRole  0.5 mg Oral Q T,Th,Sat-1800   sevelamer carbonate  1,600 mg Oral TID WC   sodium chloride flush  3 mL Intravenous Q12H   tamsulosin  0.4 mg Oral Daily   zolpidem  5 mg Oral QHS   Continuous Infusions:  azithromycin Stopped (02/20/23 1509)   cefTRIAXone (ROCEPHIN)  IV Stopped (02/20/23 1402)     LOS: 1 day   Time spent:  15mins  Florencia Reasons, MD PhD FACP [facility]  Available via Epic secure chat 7am-7pm for nonurgent issues Please page for urgent issues To page the attending provider  between 7A-7P or the covering provider during after hours 7P-7A, please log into the web site [email or web address] and access using universal [place] password for that web site. If you do not have the password, please call the hospital operator.    02/21/2023, 12:58 PM

## 2023-02-21 NOTE — Evaluation (Signed)
Physical Therapy Evaluation Patient Details Name: John [name] MRN: [id number] DOB: June 19, 1951 Today's Date: 02/21/2023  History of Present Illness  72 y.o. male presents to [facility] hospital on 3.29/2024 with SOB and weakness. Chest x-ray demonstrates PNA. Pt also experiencing diarrhea. PMH includes endocarditis, PAD, CABG, VATS and decortication, ESRD, CHF.  Clinical Impression  Pt presents to PT with deficits in endurance, cardiopulmonary function, strength, power, gait. Pt is limited by fatigue, reporting a decline in his activity tolerance for ~1 year. Pt attributes this to his reduce ejection fraction. PT encourages frequent mobilization during this admission in an effort to improve cardiopulmonary function and neuromuscular endurance. PT will continue to follow in the acute setting, no post-acute PT needs anticipated.       Recommendations for follow up therapy are one component of a multi-disciplinary discharge planning process, led by the attending physician.  Recommendations may be updated based on patient status, additional functional criteria and insurance authorization.  Follow Up Recommendations       Assistance Recommended at Discharge PRN  Patient can return home with the following  Assistance with cooking/housework;Assist for transportation;Help with stairs or ramp for entrance    Equipment Recommendations None recommended by PT  Recommendations for Other Services       Functional Status Assessment Patient has had a recent decline in their functional status and demonstrates the ability to make significant improvements in function in a reasonable and predictable amount of time.     Precautions / Restrictions Precautions Precautions: Fall Precaution Comments: monitor sats Restrictions Weight Bearing Restrictions: No      Mobility  Bed Mobility                    Transfers Overall transfer level: Independent Equipment used: None                     Ambulation/Gait Ambulation/Gait assistance: Supervision Gait Distance (Feet): 150 Feet Assistive device: None Gait Pattern/deviations: Step-through pattern, Drifts right/left Gait velocity: reduced Gait velocity interpretation: <1.8 ft/sec, indicate of risk for recurrent falls   General Gait Details: pt with slowed step-through gait, mild lateral drift and PRN UE support of counter  Stairs            Wheelchair Mobility    Modified Rankin (Stroke Patients Only)       Balance Overall balance assessment: Needs assistance Sitting-balance support: No upper extremity supported, Feet supported Sitting balance-Leahy Scale: Good     Standing balance support: No upper extremity supported, During functional activity Standing balance-Leahy Scale: Good                               Pertinent Vitals/Pain Pain Assessment Pain Assessment: Faces Faces Pain Scale: Hurts even more Pain Location: head Pain Descriptors / Indicators: Headache Pain Intervention(s): Patient requesting pain meds-RN notified (tylenol)    Home Living Family/patient expects to be discharged to:: Private residence Living Arrangements: Spouse/significant other;Other relatives Available Help at Discharge: Family;Available 24 hours/day Type of Home: House Home Access: Stairs to enter Entrance Stairs-Rails: Can reach both Entrance Stairs-Number of Steps:  (3)   Home Layout: One level Home Equipment: None      Prior Function Prior Level of Function : Independent/Modified Independent             Mobility Comments: pt reports reduced activity tolerance over the last few months, attributes this to his reduced ejection fraction  Hand Dominance   Dominant Hand: Right    Extremity/Trunk Assessment   Upper Extremity Assessment Upper Extremity Assessment: Overall WFL for tasks assessed    Lower Extremity Assessment Lower Extremity Assessment: Generalized weakness     Cervical / Trunk Assessment Cervical / Trunk Assessment: Normal  Communication   Communication: No difficulties  Cognition Arousal/Alertness: Awake/alert Behavior During Therapy: WFL for tasks assessed/performed Overall Cognitive Status: Within Functional Limits for tasks assessed                                          General Comments General comments (skin integrity, edema, etc.): pt on RA during session, sats 87-88% with mobility and at rest, RN made aware    Exercises     Assessment/Plan    PT Assessment Patient needs continued PT services  PT Problem List Decreased strength;Decreased activity tolerance;Decreased balance;Decreased mobility;Cardiopulmonary status limiting activity       PT Treatment Interventions DME instruction;Gait training;Stair training;Functional mobility training;Therapeutic activities;Therapeutic exercise;Balance training;Patient/family education    PT Goals (Current goals can be found in the Care Plan section)  Acute Rehab PT Goals Patient Stated Goal: to go home, regain endurance PT Goal Formulation: With patient Time For Goal Achievement: 03/07/23 Potential to Achieve Goals: Good Additional Goals Additional Goal #1: Pt will score >19/24 on the DGI to indicate a reduced risk for falls Additional Goal #2: Pt will report 0/4 DOE when ambulating for >250' to demonstrates improved activity tolerance    Frequency Min 3X/week     Co-evaluation               AM-PAC PT "6 Clicks" Mobility  Outcome Measure Help needed turning from your back to your side while in a flat bed without using bedrails?: None Help needed moving from lying on your back to sitting on the side of a flat bed without using bedrails?: None Help needed moving to and from a bed to a chair (including a wheelchair)?: None Help needed standing up from a chair using your arms (e.g., wheelchair or bedside chair)?: None Help needed to walk in hospital room?:  A Little Help needed climbing 3-5 steps with a railing? : A Little 6 Click Score: 22    End of Session   Activity Tolerance: Patient limited by fatigue Patient left: in bed;with call bell/phone within reach Nurse Communication: Mobility status PT Visit Diagnosis: Other abnormalities of gait and mobility (R26.89);Muscle weakness (generalized) (M62.81)    Time: [id number] PT Time Calculation (min) (ACUTE ONLY): 20 min   Charges:   PT Evaluation $PT Eval Low Complexity: [place], PT, DPT Acute Rehabilitation Office [phone]   Zenaida Niece 02/21/2023, 12:06 PM

## 2023-02-21 NOTE — Progress Notes (Signed)
  Transition of Care [facility]) Screening Note   Patient Details  Name: John [name] Date of Birth: 27-Mar-1951   Transition of Care [facility]) CM/SW Contact:    Pollie Friar, RN Phone Number: 02/21/2023, 1:24 PM   Pt is from home with his spouse.  Dialyzes on TTS schedule at [facility] unit. Missed his last HD (yesterday). On Droplet precautions. Transition of Care Department [facility]) has reviewed patient. We will continue to monitor patient advancement through interdisciplinary progression rounds. If new patient transition needs arise, please place a TOC consult.

## 2023-02-21 NOTE — Progress Notes (Signed)
[facility] Progress Note   Subjective:  Seen in room - still coughing and not feeling great. No CP/dyspnea at the moment. Per notes, wife is now sick - viral panel checked which is positive for coronavirus NL63.  Objective Vitals:   02/20/23 1637 02/20/23 1958 02/21/23 0546 02/21/23 0805  BP:  120/61 107/72 111/77  Pulse:  89 83 86  Resp:  18 18 18   Temp:  98.7 F (37.1 C) 98.4 F (36.9 C) 98 F (36.7 C)  TempSrc:  Oral Oral Oral  SpO2: 98% 92% 91% 95%  Weight:   88.6 kg    Physical Exam General: Well appearing man, NAD Heart: RRR; no murmur Lungs: CTA anteriorly Abdomen: soft Extremities: no LE edema Dialysis Access: RUE AVF + bruit  Additional Objective Labs: Basic Metabolic Panel: Recent Labs  Lab 02/19/23 1246 02/20/23 0332 02/21/23 0320  NA 135 134* 133*  K 4.9 4.8 4.7  CL 91* 94* 91*  CO2 25 21* 24  GLUCOSE 183* 178* 169*  BUN 65* 72* 48*  CREATININE 10.46* 10.91* 7.82*  CALCIUM 8.2* 8.2* 8.6*  PHOS  --  8.4*  --    Liver Function Tests: Recent Labs  Lab 02/20/23 0332  AST 24  ALT 25  ALKPHOS 117  BILITOT 1.1  PROT 6.1*  ALBUMIN 2.9*   CBC: Recent Labs  Lab 02/19/23 1246 02/20/23 0332 02/21/23 0320  WBC 10.1 8.2 8.9  NEUTROABS  --   --  6.8  HGB 10.7* 10.9* 10.8*  HCT 33.3* 32.5* 31.9*  MCV 102.5* 100.0 100.0  PLT 128* 108* 125*   Studies/Results: DG Chest 2 View  Result Date: 02/19/2023 CLINICAL DATA:  Chest pain.  Cough and cold symptoms x3 days. EXAM: CHEST - 2 VIEW COMPARISON:  11/04/2022. FINDINGS: Right middle lobe consolidation. Stable cardiac and mediastinal contours with postoperative changes of median sternotomy and CABG. No pleural effusion or pneumothorax. IMPRESSION: Right middle lobe pneumonia. Electronically Signed   By: Emmit Alexanders M.D.   On: 02/19/2023 13:23    Medications:  azithromycin Stopped (02/20/23 1509)   cefTRIAXone (ROCEPHIN)  IV Stopped (02/20/23 1402)    aspirin EC  81 mg Oral Daily    atorvastatin  40 mg Oral QHS   calcitRIOL  1 mcg Oral Once per day on Tue Thu Sat   gabapentin  200 mg Oral QHS   guaiFENesin  1,200 mg Oral BID   heparin  5,000 Units Subcutaneous Q8H   midodrine  10 mg Oral Q T,Th,Sat-1800   multivitamin  1 tablet Oral QHS   pantoprazole  40 mg Oral Daily   rOPINIRole  0.5 mg Oral QHS   And   rOPINIRole  0.5 mg Oral Q T,Th,Sat-1800   sevelamer carbonate  1,600 mg Oral TID WC   sodium chloride flush  3 mL Intravenous Q12H   tamsulosin  0.4 mg Oral Daily   zolpidem  5 mg Oral QHS    Dialysis Orders: TTS at [facility] - last HD 3/26 (left 89.2kg) 4hr, 400/A1.5, EDW 88.5kg, 2K/2Ca, UFP #2, RUE AVF, no heparin - no ESA - calcitriol 60mcg PO q HD   Assessment/Plan:  RML pneumonia: Started on Ceftriaxone + azithromycin. Viral panel + coronavirus NL63.  ESRD: Usual TTS schedule - next 02/23/23.  Hypertension/volume: BP controlled, no LE edema. No overt pulm edema on CXR.  Anemia: Hgb 10.8 - trend for now, does not get ESA as outpatient.  Metabolic bone disease: Ca ok, Phos high. Continue home  binders + VDRA.  Nutrition: Alb low, adding protein supplements.  CAD, Hx CABG  T2DM  Run of VT 3/29: Asymptomatic. Per primary.  Veneta Penton, PA-C 02/21/2023, 10:01 AM  [employer]

## 2023-02-22 ENCOUNTER — Inpatient Hospital Stay (HOSPITAL_COMMUNITY): Payer: Medicare HMO

## 2023-02-22 DIAGNOSIS — I5023 Acute on chronic systolic (congestive) heart failure: Secondary | ICD-10-CM

## 2023-02-22 DIAGNOSIS — N186 End stage renal disease: Secondary | ICD-10-CM | POA: Diagnosis not present

## 2023-02-22 DIAGNOSIS — R109 Unspecified abdominal pain: Secondary | ICD-10-CM | POA: Diagnosis not present

## 2023-02-22 DIAGNOSIS — J189 Pneumonia, unspecified organism: Secondary | ICD-10-CM | POA: Diagnosis not present

## 2023-02-22 LAB — CBC WITH DIFFERENTIAL/PLATELET
Abs Immature Granulocytes: 0.04 10*3/uL (ref 0.00–0.07)
Basophils Absolute: 0 10*3/uL (ref 0.0–0.1)
Basophils Relative: 0 %
Eosinophils Absolute: 0 10*3/uL (ref 0.0–0.5)
Eosinophils Relative: 0 %
HCT: 33.1 % — ABNORMAL LOW (ref 39.0–52.0)
Hemoglobin: 10.9 g/dL — ABNORMAL LOW (ref 13.0–17.0)
Immature Granulocytes: 0 %
Lymphocytes Relative: 7 %
Lymphs Abs: 0.7 10*3/uL (ref 0.7–4.0)
MCH: 33.6 pg (ref 26.0–34.0)
MCHC: 32.9 g/dL (ref 30.0–36.0)
MCV: 102.2 fL — ABNORMAL HIGH (ref 80.0–100.0)
Monocytes Absolute: 0.9 10*3/uL (ref 0.1–1.0)
Monocytes Relative: 10 %
Neutro Abs: 8.1 10*3/uL — ABNORMAL HIGH (ref 1.7–7.7)
Neutrophils Relative %: 83 %
Platelets: 121 10*3/uL — ABNORMAL LOW (ref 150–400)
RBC: 3.24 MIL/uL — ABNORMAL LOW (ref 4.22–5.81)
RDW: 14.3 % (ref 11.5–15.5)
WBC: 9.8 10*3/uL (ref 4.0–10.5)
nRBC: 0 % (ref 0.0–0.2)

## 2023-02-22 LAB — RENAL FUNCTION PANEL
Albumin: 2.9 g/dL — ABNORMAL LOW (ref 3.5–5.0)
Anion gap: 19 — ABNORMAL HIGH (ref 5–15)
BUN: 59 mg/dL — ABNORMAL HIGH (ref 8–23)
CO2: 22 mmol/L (ref 22–32)
Calcium: 8.4 mg/dL — ABNORMAL LOW (ref 8.9–10.3)
Chloride: 92 mmol/L — ABNORMAL LOW (ref 98–111)
Creatinine, Ser: 8.84 mg/dL — ABNORMAL HIGH (ref 0.61–1.24)
GFR, Estimated: 6 mL/min — ABNORMAL LOW (ref >60)
Glucose, Bld: 158 mg/dL — ABNORMAL HIGH (ref 70–99)
Phosphorus: 6.4 mg/dL — ABNORMAL HIGH (ref 2.5–4.6)
Potassium: 4.5 mmol/L (ref 3.5–5.1)
Sodium: 133 mmol/L — ABNORMAL LOW (ref 135–145)

## 2023-02-22 LAB — GLUCOSE, CAPILLARY
Glucose-Capillary: 276 mg/dL — ABNORMAL HIGH (ref 70–99)
Glucose-Capillary: 230 mg/dL — ABNORMAL HIGH (ref 70–99)
Glucose-Capillary: 192 mg/dL — ABNORMAL HIGH (ref 70–99)
Glucose-Capillary: 229 mg/dL — ABNORMAL HIGH (ref 70–99)

## 2023-02-22 MED ORDER — HYDROCODONE BIT-HOMATROP MBR 5-1.5 MG/5ML PO SOLN
5.0000 mL | Freq: Four times a day (QID) | ORAL | Status: AC | PRN
  Administered 2023-02-22: 5 mL via ORAL

## 2023-02-22 MED ORDER — FUROSEMIDE 10 MG/ML IJ SOLN: 60.0000 mg | Freq: Two times a day (BID) | INTRAMUSCULAR | Status: DC

## 2023-02-22 MED ORDER — TORSEMIDE 20 MG PO TABS
100.0000 mg | ORAL_TABLET | Freq: Every day | ORAL | Status: DC
  Administered 2023-02-22 – 2023-02-24 (×3): 100 mg via ORAL
  Filled 2023-02-22 (×3): qty 5

## 2023-02-22 MED ORDER — MIDODRINE HCL 5 MG PO TABS: 10.0000 mg | ORAL_TABLET | Freq: Once | ORAL | Status: DC

## 2023-02-22 MED ORDER — CHLORHEXIDINE GLUCONATE CLOTH 2 % EX PADS
6.0000 | MEDICATED_PAD | Freq: Every day | CUTANEOUS | Status: DC
  Administered 2023-02-22 – 2023-02-24 (×3): 6 via TOPICAL

## 2023-02-22 MED ORDER — IOHEXOL 9 MG/ML PO SOLN
500.0000 mL | ORAL | Status: AC
  Administered 2023-02-22 (×2): 500 mL via ORAL

## 2023-02-22 MED ORDER — TORSEMIDE 20 MG PO TABS: 100.0000 mg | ORAL_TABLET | Freq: Every day | ORAL | Status: DC

## 2023-02-22 MED ORDER — ONDANSETRON HCL 4 MG/2ML IJ SOLN
4.0000 mg | Freq: Once | INTRAMUSCULAR | Status: AC
  Administered 2023-02-22: 4 mg via INTRAVENOUS
  Filled 2023-02-22: qty 2

## 2023-02-22 NOTE — Assessment & Plan Note (Signed)
Continue aspirin, atorvastatin.   

## 2023-02-22 NOTE — Assessment & Plan Note (Signed)
Continue atorvastatin

## 2023-02-22 NOTE — Assessment & Plan Note (Signed)
Hgb stable 

## 2023-02-22 NOTE — Assessment & Plan Note (Signed)
-   Consult Nephrology, appreciate cares - Continue midodrine, Renvela

## 2023-02-22 NOTE — Progress Notes (Signed)
[facility] Progress Note   Subjective:  Seen in room - still coughing and feeling "awful".  Isn't wearing O2 because it doesn't agree with him.  No CP , +coughing.   Objective Vitals:   02/21/23 1546 02/21/23 2056 02/22/23 0517 02/22/23 0811  BP: (!) 129/110 101/68 103/64 116/82  Pulse: 83 84 88 81  Resp: 18 (!) 22 20 18   Temp: 99.4 F (37.4 C) 98.3 F (36.8 C) 99.3 F (37.4 C) 98.6 F (37 C)  TempSrc: Oral Oral Oral Oral  SpO2: 93% 95% 92% 95%  Weight:   92.9 kg    Physical Exam General: Well appearing man, NAD Heart: RRR; no murmur Lungs: clear ant, post diffuse faint wheezing in the lower lobes, occ crackles, no ^wob Abdomen: soft Extremities: no LE edema Dialysis Access: RUE AVF + bruit  Additional Objective Labs: Basic Metabolic Panel: Recent Labs  Lab 02/19/23 1246 02/20/23 0332 02/21/23 0320  NA 135 134* 133*  K 4.9 4.8 4.7  CL 91* 94* 91*  CO2 25 21* 24  GLUCOSE 183* 178* 169*  BUN 65* 72* 48*  CREATININE 10.46* 10.91* 7.82*  CALCIUM 8.2* 8.2* 8.6*  PHOS  --  8.4*  --     Liver Function Tests: Recent Labs  Lab 02/20/23 0332  AST 24  ALT 25  ALKPHOS 117  BILITOT 1.1  PROT 6.1*  ALBUMIN 2.9*    CBC: Recent Labs  Lab 02/19/23 1246 02/20/23 0332 02/21/23 0320 02/22/23 0228  WBC 10.1 8.2 8.9 9.8  NEUTROABS  --   --  6.8 8.1*  HGB 10.7* 10.9* 10.8* 10.9*  HCT 33.3* 32.5* 31.9* 33.1*  MCV 102.5* 100.0 100.0 102.2*  PLT 128* 108* 125* 121*    Studies/Results: DG Abd Portable 1V  Result Date: 02/21/2023 CLINICAL DATA:  Abdominal pain EXAM: PORTABLE ABDOMEN - 1 VIEW COMPARISON:  01/17/2009 FINDINGS: Two supine frontal views of the abdomen and pelvis are obtained, excluding the right hemidiaphragm by collimation. Bowel gas pattern is unremarkable without obstruction or ileus. There are no abdominal masses or abnormal calcifications. Diffuse atherosclerosis. Bilateral hip osteoarthritis and diffuse thoracolumbar spondylosis.  No acute fracture. IMPRESSION: 1. Unremarkable bowel gas pattern. Electronically Signed   By: Randa Ngo M.D.   On: 02/21/2023 17:29    Medications:  azithromycin Stopped (02/21/23 1600)   cefTRIAXone (ROCEPHIN)  IV Stopped (02/21/23 1511)    (feeding supplement) PROSource Plus  30 mL Oral BID BM   aspirin EC  81 mg Oral Daily   atorvastatin  40 mg Oral QHS   calcitRIOL  1 mcg Oral Once per day on Tue Thu Sat   gabapentin  200 mg Oral QHS   guaiFENesin  1,200 mg Oral BID   heparin  5,000 Units Subcutaneous Q8H   insulin aspart  0-9 Units Subcutaneous TID WC   midodrine  10 mg Oral Q T,Th,Sat-1800   multivitamin  1 tablet Oral QHS   pantoprazole  40 mg Oral Daily   rOPINIRole  0.5 mg Oral QHS   And   rOPINIRole  0.5 mg Oral Q T,Th,Sat-1800   sevelamer carbonate  1,600 mg Oral TID WC   sodium chloride flush  3 mL Intravenous Q12H   tamsulosin  0.4 mg Oral Daily   zolpidem  5 mg Oral QHS    Dialysis Orders: TTS at [facility] - last HD 3/26 (left 89.2kg) 4hr, 400/A1.5, EDW 88.5kg, 2K/2Ca, UFP #2, RUE AVF, no heparin - no ESA - calcitriol 10mcg PO  q HD   Assessment/Plan:  RML pneumonia: Started on Ceftriaxone + azithromycin. Viral panel was + for coronavirus NL63 (pt is negative for COVID-19 virus).   Hypoxemic acute resp failure: doesn't like [place] O2. Will d/w pmd.   ESRD: Usual TTS schedule. Asked to do HD today per pmd, concern for vol issue. Will plan HD today in place of tomorrow. Pt is agreeable.   Hypertension/volume: BP controlled, no LE edema. No overt pulm edema on CXR. Is up 4kg today, UF goal 3.5- 4 L .   Anemia: Hgb 10.8 - trend for now, does not get ESA as outpatient.  Metabolic bone disease: Ca ok, Phos high. Continue home binders + VDRA.  Nutrition: Alb low, adding protein supplements.  CAD, Hx CABG  T2DM  Run of VT 3/29: Asymptomatic. Per primary.  Kelly Splinter, MD 02/22/2023, 11:03 AM  Recent Labs  Lab 02/20/23 0332 02/21/23 0320 02/22/23 0228  HGB  10.9* 10.8* 10.9*  ALBUMIN 2.9*  --   --   CALCIUM 8.2* 8.6*  --   PHOS 8.4*  --   --   CREATININE 10.91* 7.82*  --   K 4.8 4.7  --     Inpatient medications:  (feeding supplement) PROSource Plus  30 mL Oral BID BM   aspirin EC  81 mg Oral Daily   atorvastatin  40 mg Oral QHS   calcitRIOL  1 mcg Oral Once per day on Tue Thu Sat   gabapentin  200 mg Oral QHS   guaiFENesin  1,200 mg Oral BID   heparin  5,000 Units Subcutaneous Q8H   insulin aspart  0-9 Units Subcutaneous TID WC   midodrine  10 mg Oral Q T,Th,Sat-1800   multivitamin  1 tablet Oral QHS   pantoprazole  40 mg Oral Daily   rOPINIRole  0.5 mg Oral QHS   And   rOPINIRole  0.5 mg Oral Q T,Th,Sat-1800   sevelamer carbonate  1,600 mg Oral TID WC   sodium chloride flush  3 mL Intravenous Q12H   tamsulosin  0.4 mg Oral Daily   zolpidem  5 mg Oral QHS    azithromycin Stopped (02/21/23 1600)   cefTRIAXone (ROCEPHIN)  IV Stopped (02/21/23 1511)   acetaminophen **OR** acetaminophen, HYDROcodone bit-homatropine, ipratropium-albuterol, polyethylene glycol

## 2023-02-22 NOTE — Assessment & Plan Note (Signed)
BP normal - Hold torsemide - Continue midodrine

## 2023-02-22 NOTE — Assessment & Plan Note (Signed)
-   Continue SS corrections 

## 2023-02-22 NOTE — Assessment & Plan Note (Addendum)
Presented with cough, dyspnea, and new infiltrate on CXR.  Noted to have +RVP. - Continue Rocephin and azithromycin - Continue pulmonary toilet

## 2023-02-22 NOTE — Assessment & Plan Note (Addendum)
Noted 3/31.  Still persistent today.  CT today showed only mild thickening of gallbladder.  Given exam was nonfocal, I think this may be spurious (or related to right heart failure) - Obtain US abdomen

## 2023-02-22 NOTE — Assessment & Plan Note (Signed)
Continue aspirin, statin.  

## 2023-02-22 NOTE — Progress Notes (Signed)
  Progress Note   Patient: John [name] [id number] DOB: 07-10-1951 DOA: 02/19/2023     2 DOS: the patient was seen and examined on 02/22/2023 at 10:16AM      Brief hospital course: [name] is a 72 yo M with ESRD, CAD s/p CABG 2020, PVD s/p aortofem bypass, pleural effusion requiring VATS 2021, sCHF who presneted with dyspnea, pneumonia, fluid overload.     Assessment and Plan: * CAP (community acquired pneumonia) Presented with cough, dyspnea, and new infiltrate on CXR.  Noted to have +RVP. - Continue Rocephin and azithromycin - Continue pulmonary toilet    ESRD (end stage renal disease) - Consult Nephrology, appreciate cares - Continue midodrine, Renvela  Acute on chronic systolic CHF (congestive heart failure) Today has developed orthopnea, dyspnea, cough with laying flat, elevated JVP, and tachypnea in the setting of imaging showing infiltrates and bilateral pleural effusions. TEE recently showed EF less than 20%, global hypokinesis, severely reduced RV function.  - Resume home torsemide - Volume control per HD  Abdominal pain Noted 3/31.  Still persistent today.  CT today showed only mild thickening of gallbladder.  Given exam was nonfocal, I think this may be spurious (or related to right heart failure) - Obtain US abdomen  Restless legs syndrome - Continue ropinirole  Anemia in chronic kidney disease Hgb stable  Carotid artery stenosis - Continue aspirin, statin  Type 2 diabetes mellitus with diabetic nephropathy - Continue SS corrections  Hypertension BP normal - Hold torsemide - Continue midodrine  S/P CABG x 4 - Continue aspirin, atorvastatin  Hyperlipidemia, unspecified - Continue atorvastatin          Subjective: Has severe orthopnea, persistent cough, malaise.  Still has dyspnea.     Physical Exam: BP 112/64   Pulse 81   Temp 98.6 F (37 C) (Oral)   Resp 18   Wt 92.7 kg   SpO2 96%   BMI 26.24 kg/m   Elderly  adult male, sitting on the edge of the bed, appears uncomfortable Tachycardic, no murmurs appreciated, JVP up to the jaw, sitting upright, no lower extremity edema Respiratory effort increased, lung sounds diminished bilaterally, no rales appreciated Abdomen soft with diffuse tenderness palpation, no focal tenderness in the right upper quadrant, guarding everywhere, voluntary, no rigidity or rebound Attention normal, affect appropriate, judgment insight appear normal    Data Reviewed: CBC unremarkable Abdominal x-ray from yesterday unremarkable CT abdomen and pelvis today shows thickening of the gallbladder, no other acute findings        Disposition: Status is: Inpatient The patient presented with pneumonia from coronavirus, possibly bacterial pneumonia  He also has Worsening congestive heart failure today, possibly right heart failure, will need dialysis, torsemide, follow closely over the next few days, likely home at the time of discharge        Author: Edwin Dada, MD 02/22/2023 4:15 PM  For on call review [email or web address].

## 2023-02-22 NOTE — Hospital Course (Signed)
John [name] is a 72 yo M with ESRD, CAD s/p CABG 2020, PVD s/p aortofem bypass, pleural effusion requiring VATS 2021, sCHF who presneted with dyspnea, pneumonia, fluid overload.

## 2023-02-22 NOTE — Assessment & Plan Note (Signed)
Continue ropinirole 

## 2023-02-22 NOTE — Progress Notes (Addendum)
Pt refused HD per Benay Pillow tx MD notified

## 2023-02-22 NOTE — Assessment & Plan Note (Addendum)
Today has developed orthopnea, dyspnea, cough with laying flat, elevated JVP, and tachypnea in the setting of imaging showing infiltrates and bilateral pleural effusions. TEE recently showed EF less than 20%, global hypokinesis, severely reduced RV function.  - Resume home torsemide - Volume control per HD

## 2023-02-23 ENCOUNTER — Inpatient Hospital Stay (HOSPITAL_COMMUNITY): Payer: Medicare HMO

## 2023-02-23 DIAGNOSIS — J189 Pneumonia, unspecified organism: Secondary | ICD-10-CM | POA: Diagnosis not present

## 2023-02-23 LAB — EXPECTORATED SPUTUM ASSESSMENT W GRAM STAIN, RFLX TO RESP C

## 2023-02-23 LAB — COMPREHENSIVE METABOLIC PANEL
ALT: 30 U/L (ref 0–44)
AST: 22 U/L (ref 15–41)
Albumin: 2.8 g/dL — ABNORMAL LOW (ref 3.5–5.0)
Alkaline Phosphatase: 143 U/L — ABNORMAL HIGH (ref 38–126)
Anion gap: 23 — ABNORMAL HIGH (ref 5–15)
BUN: 75 mg/dL — ABNORMAL HIGH (ref 8–23)
CO2: 19 mmol/L — ABNORMAL LOW (ref 22–32)
Calcium: 8.6 mg/dL — ABNORMAL LOW (ref 8.9–10.3)
Chloride: 88 mmol/L — ABNORMAL LOW (ref 98–111)
Creatinine, Ser: 9.63 mg/dL — ABNORMAL HIGH (ref 0.61–1.24)
GFR, Estimated: 5 mL/min — ABNORMAL LOW (ref >60)
Glucose, Bld: 176 mg/dL — ABNORMAL HIGH (ref 70–99)
Potassium: 5.1 mmol/L (ref 3.5–5.1)
Sodium: 130 mmol/L — ABNORMAL LOW (ref 135–145)
Total Bilirubin: 1.3 mg/dL — ABNORMAL HIGH (ref 0.3–1.2)
Total Protein: 6.5 g/dL (ref 6.5–8.1)

## 2023-02-23 LAB — CBC WITH DIFFERENTIAL/PLATELET
Abs Immature Granulocytes: 0.05 10*3/uL (ref 0.00–0.07)
Basophils Absolute: 0 10*3/uL (ref 0.0–0.1)
Basophils Relative: 0 %
Eosinophils Absolute: 0.1 10*3/uL (ref 0.0–0.5)
Eosinophils Relative: 1 %
HCT: 32.7 % — ABNORMAL LOW (ref 39.0–52.0)
Hemoglobin: 10.7 g/dL — ABNORMAL LOW (ref 13.0–17.0)
Immature Granulocytes: 1 %
Lymphocytes Relative: 9 %
Lymphs Abs: 0.8 10*3/uL (ref 0.7–4.0)
MCH: 32.9 pg (ref 26.0–34.0)
MCHC: 32.7 g/dL (ref 30.0–36.0)
MCV: 100.6 fL — ABNORMAL HIGH (ref 80.0–100.0)
Monocytes Absolute: 0.7 10*3/uL (ref 0.1–1.0)
Monocytes Relative: 9 %
Neutro Abs: 6.8 10*3/uL (ref 1.7–7.7)
Neutrophils Relative %: 80 %
Platelets: 111 10*3/uL — ABNORMAL LOW (ref 150–400)
RBC: 3.25 MIL/uL — ABNORMAL LOW (ref 4.22–5.81)
RDW: 14.3 % (ref 11.5–15.5)
WBC: 8.5 10*3/uL (ref 4.0–10.5)
nRBC: 0 % (ref 0.0–0.2)

## 2023-02-23 LAB — LEGIONELLA PNEUMOPHILA SEROGP 1 UR AG: L. pneumophila Serogp 1 Ur Ag: NEGATIVE

## 2023-02-23 LAB — GLUCOSE, CAPILLARY
Glucose-Capillary: 178 mg/dL — ABNORMAL HIGH (ref 70–99)
Glucose-Capillary: 285 mg/dL — ABNORMAL HIGH (ref 70–99)
Glucose-Capillary: 89 mg/dL (ref 70–99)

## 2023-02-23 LAB — HEPATITIS B SURFACE ANTIBODY, QUANTITATIVE: Hep B S AB Quant (Post): 32.9 m[IU]/mL (ref >9.9)

## 2023-02-23 MED ORDER — ONDANSETRON HCL 4 MG/2ML IJ SOLN: 4.0000 mg | Freq: Four times a day (QID) | INTRAMUSCULAR | Status: DC | PRN

## 2023-02-23 NOTE — Progress Notes (Addendum)
PROGRESS NOTE    John [name]  [id number] DOB: 22-Aug-1951 DOA: 02/19/2023 PCP: Rhea Bleacher, NP  John [name] is a 72 yo M with ESRD, severe biventricular heart failure, CAD s/p CABG 2020, PVD s/p aortofem bypass, pleural effusion requiring VATS 2021, presented to the ED with cough and shortness of breath.   -Chest x-ray noted right middle lobe pneumonia, positive for non-COVID coronavirus NL 63. -Hospital course complicated by abdominal discomfort, CT concerning for gallbladder wall thickening, ultrasound ordered   Subjective: -Feels okay overall today, denies abdominal pain today, temp of 101 overnight -Reports orthopnea, hungry waiting for breakfast  Assessment and Plan:  CAP (community acquired pneumonia) Presented with cough, dyspnea, and new infiltrate on CXR.  -Likely viral pneumonia, positive for coronavirus NL 63 -Treated with IV ceftriaxone and azithromycin, will discontinue antibiotics - Continue pulmonary toilet  ESRD (end stage renal disease) Volume overload -Neurology following, continue HD, lower dry weight as tolerated - Continue midodrine, Renvela  Acute on chronic systolic CHF Severe biventricular failure Moderate mitral regurgitation -Has dyspnea and orthopnea in addition to above symptoms -Chest x-ray with pleural effusions -Recent echo 3/19 noted EF less than 20%, global hypokinesis, severely reduced RV function.  -GDMT limited by hypotension, ESRD, continue midodrine -Volume managed with HD, also on torsemide, still does make some urine  Abdominal pain Noted 3/31.  Denies any abdominal pain today -CT today noted mild gallbladder wall thickening, his pain was at the site of his umbilicus , has no right upper quadrant tenderness, LFTs are unremarkable  -I agree with Dr. Sabino Niemann assessment yesterday that his gallbladder wall thickening is likely related to right heart failure, has bilateral pleural effusions as well, albumin 2.8 -will await  abdominal ultrasound today -CMP in a.m.  Restless legs syndrome - Continue ropinirole  Anemia in chronic kidney disease Hgb stable  Carotid artery stenosis - Continue aspirin, statin  Type 2 diabetes mellitus with diabetic nephropathy - Continue SS corrections  Hypertension BP normal - Hold torsemide - Continue midodrine  S/P CABG x 4 - Continue aspirin, atorvastatin  Hyperlipidemia, unspecified - Continue atorvastatin  DVT prophylaxis: Heparin subcutaneous Code Status: Full code Family Communication: None present Disposition Plan: Home likely 1 to 2 days  Consultants: Nephrology   Procedures:   Antimicrobials:    Objective: Vitals:   02/23/23 0500 02/23/23 0757 02/23/23 1400 02/23/23 1408  BP:  99/61 107/68 105/76  Pulse:  72 72 71  Resp:  17 18 17   Temp:  97.6 F (36.4 C) 98.7 F (37.1 C)   TempSrc:  Oral    SpO2:  98% 90% 95%  Weight: 93 kg       Intake/Output Summary (Last 24 hours) at 02/23/2023 1427 Last data filed at 02/22/2023 2030 Gross per 24 hour  Intake 240 ml  Output --  Net 240 ml   Filed Weights   02/22/23 0517 02/22/23 1200 02/23/23 0500  Weight: 92.9 kg 92.7 kg 93 kg    Examination:  General exam: Chronically ill male sitting up in bed, AAOx3, no distress HEENT: Positive JVD CVS: S1-S2, regular rhythm Lungs: Few scattered rhonchi Abdomen: Soft, nontender, bowel sounds present  Extremities: no edema Skin: No rashes Psychiatry:  Mood & affect appropriate.     Data Reviewed:   CBC: Recent Labs  Lab 02/19/23 1246 02/20/23 0332 02/21/23 0320 02/22/23 0228 02/23/23 0749  WBC 10.1 8.2 8.9 9.8 8.5  NEUTROABS  --   --  6.8 8.1* 6.8  HGB 10.7* 10.9* 10.8*  10.9* 10.7*  HCT 33.3* 32.5* 31.9* 33.1* 32.7*  MCV 102.5* 100.0 100.0 102.2* 100.6*  PLT 128* 108* 125* 121* [id number]*   Basic Metabolic Panel: Recent Labs  Lab 02/19/23 1246 02/19/23 1807 02/20/23 0332 02/21/23 0320 02/22/23 0252 02/23/23 0749  NA 135  --  134*  133* 133* 130*  K 4.9  --  4.8 4.7 4.5 5.1  CL 91*  --  94* 91* 92* 88*  CO2 25  --  21* 24 22 19*  GLUCOSE 183*  --  178* 169* 158* 176*  BUN 65*  --  72* 48* 59* 75*  CREATININE 10.46*  --  10.91* 7.82* 8.84* 9.63*  CALCIUM 8.2*  --  8.2* 8.6* 8.4* 8.6*  MG  --  2.1 2.4  --   --   --   PHOS  --   --  8.4*  --  6.4*  --    GFR: Estimated Creatinine Clearance: 8.4 mL/min (A) (by C-G formula based on SCr of 9.63 mg/dL (H)). Liver Function Tests: Recent Labs  Lab 02/20/23 0332 02/22/23 0252 02/23/23 0749  AST 24  --  22  ALT 25  --  30  ALKPHOS 117  --  143*  BILITOT 1.1  --  1.3*  PROT 6.1*  --  6.5  ALBUMIN 2.9* 2.9* 2.8*   No results for input(s): "LIPASE", "AMYLASE" in the last 168 hours. No results for input(s): "AMMONIA" in the last 168 hours. Coagulation Profile: No results for input(s): "INR", "PROTIME" in the last 168 hours. Cardiac Enzymes: No results for input(s): "CKTOTAL", "CKMB", "CKMBINDEX", "TROPONINI" in the last 168 hours. BNP (last 3 results) No results for input(s): "PROBNP" in the last 8760 hours. HbA1C: No results for input(s): "HGBA1C" in the last 72 hours. CBG: Recent Labs  Lab 02/22/23 1157 02/22/23 1802 02/22/23 2058 02/23/23 0758 02/23/23 1155  GLUCAP 230* 192* 229* 178* 285*   Lipid Profile: No results for input(s): "CHOL", "HDL", "LDLCALC", "TRIG", "CHOLHDL", "LDLDIRECT" in the last 72 hours. Thyroid Function Tests: No results for input(s): "TSH", "T4TOTAL", "FREET4", "T3FREE", "THYROIDAB" in the last 72 hours. Anemia Panel: No results for input(s): "VITAMINB12", "FOLATE", "FERRITIN", "TIBC", "IRON", "RETICCTPCT" in the last 72 hours. Urine analysis:    Component Value Date/Time   COLORURINE STRAW (A) 08/27/2020 1155   APPEARANCEUR CLEAR 08/27/2020 1155   LABSPEC 1.009 08/27/2020 1155   PHURINE 6.0 08/27/2020 1155   GLUCOSEU 50 (A) 08/27/2020 1155   HGBUR SMALL (A) 08/27/2020 1155   BILIRUBINUR NEGATIVE 08/27/2020 1155    KETONESUR NEGATIVE 08/27/2020 1155   PROTEINUR 100 (A) 08/27/2020 1155   NITRITE NEGATIVE 08/27/2020 1155   LEUKOCYTESUR NEGATIVE 08/27/2020 1155   Sepsis Labs: @LABRCNTIP (procalcitonin:4,lacticidven:4)  ) Recent Results (from the past 240 hour(s))  Culture, blood (routine x 2)     Status: None (Preliminary result)   Collection Time: 02/19/23  3:00 PM   Specimen: BLOOD  Result Value Ref Range Status   Specimen Description BLOOD LEFT ANTECUBITAL  Final   Special Requests   Final    BOTTLES DRAWN AEROBIC AND ANAEROBIC Blood Culture results may not be optimal due to an excessive volume of blood received in culture bottles   Culture   Final    NO GROWTH 4 DAYS Performed at [facility], [place] [address], [place], [place] [address]    Report Status PENDING  Incomplete  Culture, blood (routine x 2)     Status: None (Preliminary result)   Collection Time: 02/19/23  3:06 PM   Specimen: BLOOD  Result Value Ref Range Status   Specimen Description BLOOD SITE NOT SPECIFIED  Final   Special Requests   Final    BOTTLES DRAWN AEROBIC AND ANAEROBIC Blood Culture adequate volume   Culture   Final    NO GROWTH 4 DAYS Performed at [facility], [address] [place], [place] [address]    Report Status PENDING  Incomplete  Expectorated Sputum Assessment w Gram Stain, Rflx to Resp Cult     Status: None (Preliminary result)   Collection Time: 02/20/23  3:24 PM   Specimen: Expectorated Sputum  Result Value Ref Range Status   Specimen Description EXPECTORATED SPUTUM  Final   Special Requests NONE  Final   Sputum evaluation   Final    Sputum specimen not acceptable for testing.  Please recollect.    Rondel Oh, RN 02/20/23 1812 A. LAFRANCE Performed at [facility], [place] [address] [place], [place] [address]    Report Status PENDING  Incomplete  Respiratory (~20 pathogens) panel by PCR     Status: Abnormal   Collection Time: 02/20/23  5:20 PM   Specimen: Nasopharyngeal  Swab; Respiratory  Result Value Ref Range Status   Adenovirus NOT DETECTED NOT DETECTED Final   Coronavirus 229E NOT DETECTED NOT DETECTED Final    Comment: (NOTE) The Coronavirus on the Respiratory Panel, DOES NOT test for the novel  Coronavirus (2019 nCoV)    Coronavirus HKU1 NOT DETECTED NOT DETECTED Final   Coronavirus NL63 DETECTED (A) NOT DETECTED Final   Coronavirus OC43 NOT DETECTED NOT DETECTED Final   Metapneumovirus NOT DETECTED NOT DETECTED Final   Rhinovirus / Enterovirus NOT DETECTED NOT DETECTED Final   Influenza A NOT DETECTED NOT DETECTED Final   Influenza B NOT DETECTED NOT DETECTED Final   Parainfluenza Virus 1 NOT DETECTED NOT DETECTED Final   Parainfluenza Virus 2 NOT DETECTED NOT DETECTED Final   Parainfluenza Virus 3 NOT DETECTED NOT DETECTED Final   Parainfluenza Virus 4 NOT DETECTED NOT DETECTED Final   Respiratory Syncytial Virus NOT DETECTED NOT DETECTED Final   Bordetella pertussis NOT DETECTED NOT DETECTED Final   Bordetella Parapertussis NOT DETECTED NOT DETECTED Final   Chlamydophila pneumoniae NOT DETECTED NOT DETECTED Final   Mycoplasma pneumoniae NOT DETECTED NOT DETECTED Final    Comment: Performed at [facility], [place] [address], [place], [place] [address]  MRSA Next Gen by PCR, Nasal     Status: None   Collection Time: 02/20/23  5:20 PM   Specimen: Nasal Mucosa; Nasal Swab  Result Value Ref Range Status   MRSA by PCR Next Gen NOT DETECTED NOT DETECTED Final    Comment: (NOTE) The GeneXpert MRSA Assay (FDA approved for NASAL specimens only), is one component of a comprehensive MRSA colonization surveillance program. It is not intended to diagnose MRSA infection nor to guide or monitor treatment for MRSA infections. Test performance is not FDA approved in patients less than 66 years old. Performed at [facility], [place] [address] [place], [place] [address]   SARS Coronavirus 2 by RT PCR (hospital order, performed in [facility]  hospital lab) *cepheid single result test* Anterior Nasal Swab     Status: None   Collection Time: 02/20/23  5:20 PM   Specimen: Anterior Nasal Swab  Result Value Ref Range Status   SARS Coronavirus 2 by RT PCR NEGATIVE NEGATIVE Final    Comment: Performed at [facility], [place] [address] [place], [place] [address]     Radiology Studies:    Scheduled Meds:  (feeding supplement) PROSource Plus  30 mL Oral BID BM   aspirin EC  81 mg Oral Daily   atorvastatin  40 mg Oral QHS   calcitRIOL  1 mcg Oral Once per day on Tue Thu Sat   Chlorhexidine Gluconate Cloth  6 each Topical Q0600   gabapentin  200 mg Oral QHS   guaiFENesin  1,200 mg Oral BID   heparin  5,000 Units Subcutaneous Q8H   insulin aspart  0-9 Units Subcutaneous TID WC   midodrine  10 mg Oral Q T,Th,Sat-1800   multivitamin  1 tablet Oral QHS   pantoprazole  40 mg Oral Daily   rOPINIRole  0.5 mg Oral QHS   And   rOPINIRole  0.5 mg Oral Q T,Th,Sat-1800   sevelamer carbonate  1,600 mg Oral TID WC   sodium chloride flush  3 mL Intravenous Q12H   tamsulosin  0.4 mg Oral Daily   torsemide  100 mg Oral Daily   zolpidem  5 mg Oral QHS   Continuous Infusions:  azithromycin Stopped (02/22/23 1723)   cefTRIAXone (ROCEPHIN)  IV Stopped (02/22/23 1549)     LOS: 3 days    Time spent: 89min    Domenic Polite, MD [facility]   02/23/2023, 2:27 PM

## 2023-02-23 NOTE — Progress Notes (Signed)
Pt receives out-pt HD at [facility] on TTS. Will assist as needed.   Stiven Kaspar Renal Navigator [phone] 

## 2023-02-23 NOTE — Care Management Important Message (Signed)
Important Message  Patient Details  Name: JERROL EMARD MRN: [id number] Date of Birth: 1951/10/24   Medicare Important Message Given:  Yes     Hannah Beat 02/23/2023, 11:35 AM

## 2023-02-23 NOTE — Progress Notes (Signed)
   02/23/23 1805  Vitals  Temp 98.1 F (36.7 C)  Pulse Rate 78  Resp 19  BP 117/63  SpO2 100 %  O2 Device Room Air  Weight  (unable to weigh)  Type of Weight Post-Dialysis  Oxygen Therapy  Patient Activity (if Appropriate) In bed  Pulse Oximetry Type Continuous  Oximetry Probe Site Changed No  Post Treatment  Dialyzer Clearance Lightly streaked  Duration of HD Treatment -hour(s) 3.45 hour(s)  Hemodialysis Intake (mL) 0 mL  Liters Processed 90  Fluid Removed (mL) 4000 mL  Tolerated HD Treatment Yes  AVG/AVF Arterial Site Held (minutes) 10 minutes  AVG/AVF Venous Site Held (minutes) 10 minutes   Received patient in bed to unit.  Alert and oriented.  Informed consent signed and in chart.   TX duration:3.45  Patient tolerated well.  Transported back to the room  Alert, without acute distress.  Hand-off given to patient's nurse.   Access used: LUAF Access issues: no complications  Total UF removed: 4000 Medication(s) given: none     [facility]

## 2023-02-23 NOTE — Progress Notes (Signed)
   02/23/23 1000  Mobility  Activity Ambulated with assistance in hallway  Level of Assistance Contact guard assist, steadying assist  Assistive Device None  Distance Ambulated (ft) 90 ft  Activity Response Tolerated well  Mobility Referral Yes  $Mobility charge 1 Mobility   Mobility Specialist Progress Note  Pt was in bed and agreeable. Had c/o weakness throughout ambulation. Returned to bed w/ all needs met and call bell in reach.  Lucious Groves Mobility Specialist  Please contact via SecureChat or Rehab office at [phone]

## 2023-02-23 NOTE — Progress Notes (Signed)
PT Cancellation Note  Patient Details Name: John [name] MRN: [id number] DOB: [birth year]   Cancelled Treatment:    Reason Eval/Treat Not Completed: (P) Patient at procedure or test/unavailable (Pt leaving for dialysis. Will follow up as schedule allows.)   Michelle Nasuti 02/23/2023, 1:13 PM

## 2023-02-23 NOTE — Progress Notes (Signed)
[facility] Progress Note   Subjective:  Seen in room. C/o having a episode of lightheadedness. Not on O2, otherwise still feels "awful", some nausea, some orthopnea. Otherwise nondescript.  SpO2 96% on RA.   Objective Vitals:   02/22/23 2030 02/23/23 0404 02/23/23 0500 02/23/23 0757  BP: (!) 94/59 107/87  99/61  Pulse: 85 72  72  Resp: 18 17  17   Temp: 98.4 F (36.9 C) 98.2 F (36.8 C)  97.6 F (36.4 C)  TempSrc:  Oral  Oral  SpO2: 98% 97%  98%  Weight:   93 kg    Physical Exam General: Well appearing man, NAD Heart: RRR; no murmur Lungs: clear ant, post diffuse faint wheezing in the lower lobes, occ crackles, no ^wob Abdomen: soft Extremities: no LE edema Dialysis Access: RUE AVF + bruit  Additional Objective Labs: Basic Metabolic Panel: Recent Labs  Lab 02/20/23 0332 02/21/23 0320 02/22/23 0252 02/23/23 0749  NA 134* 133* 133* 130*  K 4.8 4.7 4.5 5.1  CL 94* 91* 92* 88*  CO2 21* 24 22 19*  GLUCOSE 178* 169* 158* 176*  BUN 72* 48* 59* 75*  CREATININE 10.91* 7.82* 8.84* 9.63*  CALCIUM 8.2* 8.6* 8.4* 8.6*  PHOS 8.4*  --  6.4*  --     Liver Function Tests: Recent Labs  Lab 02/20/23 0332 02/22/23 0252 02/23/23 0749  AST 24  --  22  ALT 25  --  30  ALKPHOS 117  --  143*  BILITOT 1.1  --  1.3*  PROT 6.1*  --  6.5  ALBUMIN 2.9* 2.9* 2.8*    CBC: Recent Labs  Lab 02/19/23 1246 02/20/23 0332 02/21/23 0320 02/22/23 0228 02/23/23 0749  WBC 10.1 8.2 8.9 9.8 8.5  NEUTROABS  --   --  6.8 8.1* 6.8  HGB 10.7* 10.9* 10.8* 10.9* 10.7*  HCT 33.3* 32.5* 31.9* 33.1* 32.7*  MCV 102.5* 100.0 100.0 102.2* 100.6*  PLT 128* 108* 125* 121* 111*    Studies/Results: US Abdomen Complete  Result Date: 02/23/2023 CLINICAL DATA:  Abnormal CT scan.  Abdominal pain EXAM: ABDOMEN ULTRASOUND COMPLETE COMPARISON:  None Available. FINDINGS: Gallbladder: Distended gallbladder but no stones. There is significant gallbladder wall thickening and gallbladder wall  edema measuring up to 10 mm. Common bile duct: Diameter: 3 mm. Liver: Slightly echogenic and nodular liver. Portal vein is patent on color Doppler imaging with normal direction of blood flow towards the liver. IVC: No abnormality visualized. Pancreas: Visualized portion unremarkable. Spleen: 8.2 cm Right Kidney: Length: 7.3 cm.  Small echogenic. Left Kidney: Length: 8.6 cm.  Small echogenic Abdominal aorta: Postoperative changes identified as on CT scan. Other findings: Small right pleural effusion. IMPRESSION: Small echogenic appearing kidneys. Small right pleural effusion. Mildly distended gallbladder. There are no stones. However there is significant wall thickening and wall edema. Nonspecific. Please correlate for any known history. If there is concern of acalculous cholecystitis, a HIDA scan may be of some benefit. Electronically Signed   By: Jill Side M.D.   On: 02/23/2023 11:08   CT ABDOMEN PELVIS WO CONTRAST  Result Date: 02/22/2023 CLINICAL DATA:  Abdominal pain EXAM: CT ABDOMEN AND PELVIS WITHOUT CONTRAST TECHNIQUE: Multidetector CT imaging of the abdomen and pelvis was performed following the standard protocol without IV contrast. RADIATION DOSE REDUCTION: This exam was performed according to the departmental dose-optimization program which includes automated exposure control, adjustment of the mA and/or kV according to patient size and/or use of iterative reconstruction technique. COMPARISON:  08/27/2020 FINDINGS: Lower chest: Heart is enlarged in size. Coronary artery calcifications are seen. Small right pleural effusion is seen. Minimal left pleural effusion is seen. Small patchy infiltrates are seen in lower lung fields, more so on the left side. Hepatobiliary: Liver is enlarged measuring 22.2 cm in length. There is no dilation of bile ducts. Gallbladder is not distended. There is small diffuse wall thickening in gallbladder. There is trace amount of fluid around the gallbladder. There is no  dilation of bile ducts. Pancreas: No focal abnormalities are seen. Spleen: No acute findings are seen. Adrenals/Urinary Tract: Adrenals are unremarkable. There is atrophy in both native kidneys suggesting medical renal disease. There is no hydronephrosis. There are no renal or ureteral stones. Urinary bladder is unremarkable. Stomach/Bowel: Stomach is unremarkable. Small bowel loops are not dilated. Short segment of small bowel loop is noted within ventral hernia in left anterior abdominal wall with no significant change. The appendix is not seen. Scattered diverticula are seen in colon. There is no pericolic stranding or fluid collection. Vascular/Lymphatic: Extensive calcifications are seen in the aorta and its major branches. There is previous aortobifemoral stent. Evaluation of lumen of vascular structures is limited in this noncontrast study. Reproductive: There are scattered coarse calcifications in prostate. Other: There no ascites or pneumoperitoneum. Musculoskeletal: There are foci of radiolucency in the posterolateral right acetabulum. There is interval worsening of this finding. Findings may suggest progression of degenerative changes in both hips. Degenerative changes with bony spurs are noted in lumbar spine. IMPRESSION: There is no evidence of intestinal obstruction or pneumoperitoneum. There is no hydronephrosis. There is wall thickening in gallbladder along with small amount of pericholecystic fluid. If there is clinical suspicion for acute cholecystitis, follow-up gallbladder sonogram may be considered. There is no dilation of bile ducts. There are multiple subcortical cysts in the posterior aspects of both hips suggesting interval progression of degenerative arthritis. Less likely possibility would be active infectious or neoplastic process. Please correlate with patient's symptoms. Enlarged liver. Both native kidneys are small in size suggesting possible medical renal disease. Small bilateral  pleural effusions, more so on the right side. Coronary artery disease. Severe aortic atherosclerosis Electronically Signed   By: Elmer Picker M.D.   On: 02/22/2023 14:08   DG Abd Portable 1V  Result Date: 02/21/2023 CLINICAL DATA:  Abdominal pain EXAM: PORTABLE ABDOMEN - 1 VIEW COMPARISON:  01/17/2009 FINDINGS: Two supine frontal views of the abdomen and pelvis are obtained, excluding the right hemidiaphragm by collimation. Bowel gas pattern is unremarkable without obstruction or ileus. There are no abdominal masses or abnormal calcifications. Diffuse atherosclerosis. Bilateral hip osteoarthritis and diffuse thoracolumbar spondylosis. No acute fracture. IMPRESSION: 1. Unremarkable bowel gas pattern. Electronically Signed   By: Randa Ngo M.D.   On: 02/21/2023 17:29    Medications:  azithromycin Stopped (02/22/23 1723)   cefTRIAXone (ROCEPHIN)  IV Stopped (02/22/23 1549)    (feeding supplement) PROSource Plus  30 mL Oral BID BM   aspirin EC  81 mg Oral Daily   atorvastatin  40 mg Oral QHS   calcitRIOL  1 mcg Oral Once per day on Tue Thu Sat   Chlorhexidine Gluconate Cloth  6 each Topical Q0600   gabapentin  200 mg Oral QHS   guaiFENesin  1,200 mg Oral BID   heparin  5,000 Units Subcutaneous Q8H   insulin aspart  0-9 Units Subcutaneous TID WC   midodrine  10 mg Oral Q T,Th,Sat-1800   multivitamin  1 tablet Oral QHS  pantoprazole  40 mg Oral Daily   rOPINIRole  0.5 mg Oral QHS   And   rOPINIRole  0.5 mg Oral Q T,Th,Sat-1800   sevelamer carbonate  1,600 mg Oral TID WC   sodium chloride flush  3 mL Intravenous Q12H   tamsulosin  0.4 mg Oral Daily   torsemide  100 mg Oral Daily   zolpidem  5 mg Oral QHS    Dialysis Orders: TTS at [facility] - last HD 3/26 (left 89.2kg) 4hr, 400/A1.5, EDW 88.5kg, 2K/2Ca, UFP #2, RUE AVF, no heparin - no ESA - calcitriol 3mcg PO q HD   Assessment/Plan:  RML pneumonia: Started on Ceftriaxone + azithromycin. Viral panel was + for coronavirus  NL63 (pt is negative for COVID-19 virus).   Hypoxemic acute resp failure: is on room air yesterday and today, SpO2 96% range.   ESRD: Usual TTS HD. HD today (pt refused early HD yest). Will try to lower vol 2.5- 3 L.   Hypertension/volume: BP controlled, no peripheral edema. No overt pulm edema on CXR. Up 3-4kg, goal UF 2.5- 3 L today. BP's look soft which may be an issue. Will try to get standing wt pre hd.   Anemia: Hgb 10.8 - trend for now, does not get ESA as outpatient.  Metabolic bone disease: Ca ok, Phos high. Continue home binders + VDRA.  Nutrition: Alb low, adding protein supplements.  CAD, Hx CABG  T2DM  Kelly Splinter, MD 02/23/2023, 11:36 AM  Recent Labs  Lab 02/20/23 0332 02/21/23 0320 02/22/23 0228 02/22/23 0252 02/23/23 0749  HGB 10.9*   < > 10.9*  --  10.7*  ALBUMIN 2.9*  --   --  2.9* 2.8*  CALCIUM 8.2*   < >  --  8.4* 8.6*  PHOS 8.4*  --   --  6.4*  --   CREATININE 10.91*   < >  --  8.84* 9.63*  K 4.8   < >  --  4.5 5.1   < > = values in this interval not displayed.     Inpatient medications:  (feeding supplement) PROSource Plus  30 mL Oral BID BM   aspirin EC  81 mg Oral Daily   atorvastatin  40 mg Oral QHS   calcitRIOL  1 mcg Oral Once per day on Tue Thu Sat   Chlorhexidine Gluconate Cloth  6 each Topical Q0600   gabapentin  200 mg Oral QHS   guaiFENesin  1,200 mg Oral BID   heparin  5,000 Units Subcutaneous Q8H   insulin aspart  0-9 Units Subcutaneous TID WC   midodrine  10 mg Oral Q T,Th,Sat-1800   multivitamin  1 tablet Oral QHS   pantoprazole  40 mg Oral Daily   rOPINIRole  0.5 mg Oral QHS   And   rOPINIRole  0.5 mg Oral Q T,Th,Sat-1800   sevelamer carbonate  1,600 mg Oral TID WC   sodium chloride flush  3 mL Intravenous Q12H   tamsulosin  0.4 mg Oral Daily   torsemide  100 mg Oral Daily   zolpidem  5 mg Oral QHS    azithromycin Stopped (02/22/23 1723)   cefTRIAXone (ROCEPHIN)  IV Stopped (02/22/23 1549)   acetaminophen **OR** acetaminophen,  HYDROcodone bit-homatropine, ipratropium-albuterol, ondansetron (ZOFRAN) IV, polyethylene glycol

## 2023-02-24 DIAGNOSIS — J189 Pneumonia, unspecified organism: Secondary | ICD-10-CM | POA: Diagnosis not present

## 2023-02-24 LAB — COMPREHENSIVE METABOLIC PANEL
ALT: 39 U/L (ref 0–44)
AST: 35 U/L (ref 15–41)
Albumin: 2.7 g/dL — ABNORMAL LOW (ref 3.5–5.0)
Alkaline Phosphatase: 155 U/L — ABNORMAL HIGH (ref 38–126)
Anion gap: 16 — ABNORMAL HIGH (ref 5–15)
BUN: 41 mg/dL — ABNORMAL HIGH (ref 8–23)
CO2: 25 mmol/L (ref 22–32)
Calcium: 8.8 mg/dL — ABNORMAL LOW (ref 8.9–10.3)
Chloride: 92 mmol/L — ABNORMAL LOW (ref 98–111)
Creatinine, Ser: 6.6 mg/dL — ABNORMAL HIGH (ref 0.61–1.24)
GFR, Estimated: 8 mL/min — ABNORMAL LOW (ref >60)
Glucose, Bld: 168 mg/dL — ABNORMAL HIGH (ref 70–99)
Potassium: 4.5 mmol/L (ref 3.5–5.1)
Sodium: 133 mmol/L — ABNORMAL LOW (ref 135–145)
Total Bilirubin: 1.3 mg/dL — ABNORMAL HIGH (ref 0.3–1.2)
Total Protein: 6.4 g/dL — ABNORMAL LOW (ref 6.5–8.1)

## 2023-02-24 LAB — CULTURE, BLOOD (ROUTINE X 2)
Culture: NO GROWTH
Culture: NO GROWTH
Special Requests: ADEQUATE

## 2023-02-24 LAB — CBC
HCT: 31 % — ABNORMAL LOW (ref 39.0–52.0)
Hemoglobin: 10.7 g/dL — ABNORMAL LOW (ref 13.0–17.0)
MCH: 33.8 pg (ref 26.0–34.0)
MCHC: 34.5 g/dL (ref 30.0–36.0)
MCV: 97.8 fL (ref 80.0–100.0)
Platelets: 133 10*3/uL — ABNORMAL LOW (ref 150–400)
RBC: 3.17 MIL/uL — ABNORMAL LOW (ref 4.22–5.81)
RDW: 14.3 % (ref 11.5–15.5)
WBC: 9.6 10*3/uL (ref 4.0–10.5)
nRBC: 0 % (ref 0.0–0.2)

## 2023-02-24 LAB — GLUCOSE, CAPILLARY
Glucose-Capillary: 265 mg/dL — ABNORMAL HIGH (ref 70–99)
Glucose-Capillary: 102 mg/dL — ABNORMAL HIGH (ref 70–99)

## 2023-02-24 LAB — HEMOGLOBIN A1C

## 2023-02-24 MED ORDER — CALCITRIOL 0.5 MCG PO CAPS: 1.0000 ug | ORAL_CAPSULE | ORAL | 2 refills | Status: AC

## 2023-02-24 MED ORDER — PROSOURCE PLUS PO LIQD: 30.0000 mL | Freq: Two times a day (BID) | ORAL | Status: DC

## 2023-02-24 MED ORDER — SODIUM CHLORIDE 0.9 % IV SOLN
500.0000 mg | Freq: Once | INTRAVENOUS | Status: AC
  Administered 2023-02-24: 500 mg via INTRAVENOUS
  Filled 2023-02-24: qty 5

## 2023-02-24 MED ORDER — SODIUM CHLORIDE 0.9 % IV SOLN
2.0000 g | Freq: Once | INTRAVENOUS | Status: AC
  Administered 2023-02-24: 2 g via INTRAVENOUS
  Filled 2023-02-24: qty 20

## 2023-02-24 MED ORDER — GUAIFENESIN ER 600 MG PO TB12: 1200.0000 mg | ORAL_TABLET | Freq: Two times a day (BID) | ORAL | 0 refills | Status: AC

## 2023-02-24 MED ORDER — CHLORHEXIDINE GLUCONATE CLOTH 2 % EX PADS: 6.0000 | MEDICATED_PAD | Freq: Every day | CUTANEOUS | Status: DC

## 2023-02-24 NOTE — Progress Notes (Signed)
D/C orders noted. Contacted [facility] to advise clinic of pt's d/c today and that pt should resume care tomorrow. Spoke to clinic earlier today and advised that pt's schedule will remain the same at d/c (pt tested positive for coronavirus NL 63). This info was provided to nephrologist and attending.   Melven Sartorius Renal Navigator [phone]

## 2023-02-24 NOTE — Discharge Summary (Signed)
Physician Discharge Summary  John [name] [id number] DOB: May 19, 1951 DOA: 02/19/2023  PCP: John Bleacher, NP  Admit date: 02/19/2023 Discharge date: 02/24/2023  Admitted From: Home Discharge disposition: Home  Recommendations at discharge:  Follow-up with nephrology as an outpatient Follow-up with general surgery as an outpatient.  Call for appointment    Brief narrative: John [name] is a 72 y.o. male with PMH significant for ESRD-HD-TTS, severe biventricular heart failure, DM2, HTN, HLD, CAD s/p CABG 2020, carotid artery stenosis, PVD s/p aortofem bypass, pleural effusion requiring VATS 2021, chronic anemia 3/29, patient presented to the ED with complaint of productive cough, fever, progressive dyspnea, weakness  In the ED, patient was noted to have a fever of 102.2 Labs with WC count 10.1, Chest x-ray showed right middle lobe pneumonia Respiratory virus panel positive for non-COVID coronavirus NL 63. Patient was started on IV Rocephin, azithromycin Admitted to [facility]  Subjective: Patient was seen and examined this afternoon.  Pleasant elderly Caucasian male.  Lying down in bed.  His main complaint is his chronic back pain because of Rex in the past.  He has no abdominal symptoms. Chart reviewed. Afebrile, hemodynamically stable, breathing on room air Last set of labs this morning with sodium 133, alk phos 155, WBC count normal, hemoglobin 10.7  Hospital course: CAP (community acquired pneumonia) Viral pneumonia Presented with cough, dyspnea, and new infiltrate on CXR.  Likely viral pneumonia, positive for coronavirus NL 63 Initially started on broad-spectrum IV antibiotics.  Completed 5-day course of antibiotics.  Continue pulmonary toileting. Currently breathing on room air.  Able to ambulate without oxygen. His main symptom at this time is productive cough.  He is on scheduled Robitussin which will continue postdischarge.  ESRD (end stage renal  disease) Volume overload Chronic hypotension Nephrology following, continue HD, lower dry weight as tolerated Continue Renvela   Acute on chronic systolic CHF Severe biventricular failure Moderate mitral regurgitation Has dyspnea and orthopnea in addition to above symptoms Chest x-ray with pleural effusions Recent echo 3/19 noted EF less than 20%, global hypokinesis, severely reduced RV function.  He is on torsemide 100 mg daily.  But because of hypotension, he is also on midodrine 10 mg 3 times a week before dialysis   Abdominal pain 3/31, patient complained of abdominal pain. 4/1, CT abdomen and pelvis was obtained which showed gallbladder wall thickening. 4/2, right upper quadrant ultrasound showed mildly distended gallbladder. There are no stones. However there is significant wall thickening and wall edema. Nonspecific.  4/3, on my exam today, he has no abdominal pain or tenderness even on deep palpation.  Outpatient referral to general surgery given for any further evaluation  Type 2 diabetes mellitus A1c 6.3 on [id number] PTA on Trulicity 1.5 mg weekly on Sundays, glipizide 2.5 mg twice daily, Tradjenta 5 mg daily. Resume the same Continue Neurontin 400 mg nightly Recent Labs  Lab 02/23/23 0758 02/23/23 1155 02/23/23 2027 02/24/23 0819 02/24/23 1117  GLUCAP 178* 285* 89 265* 102*   CAD/CABG  carotid artery stenosis HLD Continue aspirin, statin  Anemia in chronic kidney disease Hgb stable Recent Labs    02/20/23 0332 02/21/23 0320 02/22/23 0228 02/23/23 0749 02/24/23 0339  HGB 10.9* 10.8* 10.9* 10.7* 10.7*  MCV 100.0 100.0 102.2* 100.6* 97.8     Restless legs syndrome Continue ropinirole  BPH Flomax    Goals of care   Code Status: Full Code   Wounds:  -    Discharge Exam:   Vitals:  02/24/23 0500 02/24/23 0554 02/24/23 0838 02/24/23 1402  BP:  97/62 96/60   Pulse:  79 78   Resp:  18 18   Temp:  98.5 F (36.9 C) 98.3 F (36.8 C)   TempSrc:   Oral Oral   SpO2:   96%   Weight: 89.8 kg   91.6 kg    Body mass index is 25.93 kg/m.  General exam: Pleasant, elderly.  Mild distress from back pain and cough Skin: No rashes, lesions or ulcers. HEENT: Atraumatic, normocephalic, no obvious bleeding Lungs: Clear to auscultation bilaterally CVS: Regular rate and rhythm, no murmur GI/Abd soft, nontender, nondistended, bowel sound present CNS: Alert, awake, oriented x 3 Psychiatry: Mood appropriate Extremities: No pedal edema, no calf tenderness  Follow ups:    Follow-up Information     John Bleacher, NP Follow up.   Contact information: John [name] [id number] [phone]         [facility] Follow up.   Specialty: General Surgery Contact information: [address] [place] [phone]                Discharge Instructions:   Discharge Instructions     Call MD for:  difficulty breathing, headache or visual disturbances   Complete by: As directed    Call MD for:  extreme fatigue   Complete by: As directed    Call MD for:  hives   Complete by: As directed    Call MD for:  persistant dizziness or light-headedness   Complete by: As directed    Call MD for:  persistant nausea and vomiting   Complete by: As directed    Call MD for:  severe uncontrolled pain   Complete by: As directed    Call MD for:  temperature >100.4   Complete by: As directed    Diet - low sodium heart healthy   Complete by: As directed    Diet Carb Modified   Complete by: As directed    Diet general   Complete by: As directed    Renal diet   Discharge instructions   Complete by: As directed    Recommendations at discharge:   Follow-up with nephrology as an outpatient  Follow-up with general surgery as an outpatient.  Call for appointment  Discharge instructions for diabetes mellitus: Check blood sugar 3 times a day and bedtime at home. If blood  sugar running above 200 or less than 70 please call your MD to adjust insulin. If you notice signs and symptoms of hypoglycemia (low blood sugar) like jitteriness, confusion, thirst, tremor and sweating, please check blood sugar, drink sugary drink/biscuits/sweets to increase sugar level and call MD or return to ER.    General discharge instructions: Follow with Primary MD John Bleacher, NP in 7 days  Please request your PCP  to go over your hospital tests, procedures, radiology results at the follow up. Please get your medicines reviewed and adjusted.  Your PCP may decide to repeat certain labs or tests as needed. Do not drive, operate heavy machinery, perform activities at heights, swimming or participation in water activities or provide baby sitting services if your were admitted for syncope or siezures until you have seen by Primary MD or a Neurologist and advised to do so again. [place] Controlled Substance Reporting System database was reviewed. Do not drive, operate heavy machinery, perform activities at heights, swim, participate in water activities  or provide baby-sitting services while on medications for pain, sleep and mood until your outpatient physician has reevaluated you and advised to do so again.  You are strongly recommended to comply with the dose, frequency and duration of prescribed medications. Activity: As tolerated with Full fall precautions use walker/cane & assistance as needed Avoid using any recreational substances like cigarette, tobacco, alcohol, or non-prescribed drug. If you experience worsening of your admission symptoms, develop shortness of breath, life threatening emergency, suicidal or homicidal thoughts you must seek medical attention immediately by calling 911 or calling your MD immediately  if symptoms less severe. You must read complete instructions/literature along with all the possible adverse reactions/side effects for all the medicines you take and  that have been prescribed to you. Take any new medicine only after you have completely understood and accepted all the possible adverse reactions/side effects.  Wear Seat belts while driving. You were cared for by a hospitalist during your hospital stay. If you have any questions about your discharge medications or the care you received while you were in the hospital after you are discharged, you can call the unit and ask to speak with the hospitalist or the covering physician. Once you are discharged, your primary care physician will handle any further medical issues. Please note that NO REFILLS for any discharge medications will be authorized once you are discharged, as it is imperative that you return to your primary care physician (or establish a relationship with a primary care physician if you do not have one).   Increase activity slowly   Complete by: As directed        Discharge Medications:   Allergies as of 02/24/2023       Reactions   Penicillins Rash, Other (See Comments)   Did it involve swelling of the face/tongue/throat, SOB, or low BP? No Did it involve sudden or severe rash/hives, skin peeling, or any reaction on the inside of your mouth or nose? Yes Did you need to seek medical attention at a hospital or doctor's office? Yes When did it last happen?      Childhood allergy If all above answers are "NO", may proceed with cephalosporin use.        Medication List     TAKE these medications    (feeding supplement) PROSource Plus liquid Take 30 mLs by mouth 2 (two) times daily between meals. Start taking on: February 25, 2023   acetaminophen 325 MG tablet Commonly known as: TYLENOL Take 650 mg by mouth every 6 (six) hours as needed for moderate pain.   aspirin EC 81 MG tablet Take 81 mg by mouth daily.   atorvastatin 40 MG tablet Commonly known as: LIPITOR Take 40 mg by mouth at bedtime.   b complex-vitamin c-folic acid 0.8 MG Tabs tablet Take 1 tablet by mouth at  bedtime.   calcitRIOL 0.5 MCG capsule Commonly known as: ROCALTROL Take 2 capsules (1 mcg total) by mouth 3 (three) times a week. Start taking on: February 25, 2023   clobetasol 0.05 % external solution Commonly known as: TEMOVATE Apply 1 Application topically 2 (two) times daily.   Dulaglutide 1.5 MG/0.5ML Sopn Inject 1.5 mg into the skin every Sunday. Trulicity   fluticasone 50 MCG/ACT nasal spray Commonly known as: FLONASE Place 1 spray into both nostrils daily as needed for allergies or rhinitis.   gabapentin 100 MG capsule Commonly known as: NEURONTIN Take 2 capsules (200 mg total) by mouth at bedtime.   glipiZIDE 2.5 MG  24 hr tablet Commonly known as: GLUCOTROL XL Take 2.5 mg by mouth 2 (two) times daily.   guaiFENesin 600 MG 12 hr tablet Commonly known as: MUCINEX Take 2 tablets (1,200 mg total) by mouth 2 (two) times daily for 7 days.   ibuprofen 200 MG tablet Commonly known as: ADVIL Take 200 mg by mouth daily as needed for moderate pain.   ketoconazole 2 % shampoo Commonly known as: NIZORAL Apply 1 Application topically 3 (three) times a week.   linagliptin 5 MG Tabs tablet Commonly known as: TRADJENTA Take 5 mg by mouth daily.   midodrine 10 MG tablet Commonly known as: PROAMATINE Take 1 tablet (10 mg total) by mouth 3 (three) times a week. Before Dialysis every Mon, Wed, Fri What changed: additional instructions   mupirocin ointment 2 % Commonly known as: BACTROBAN Apply 1 Application topically 3 (three) times daily.   OVER THE COUNTER MEDICATION Place 1 drop into both eyes 2 (two) times daily as needed (dry eyes). Hyper Tears eye drops/Unknown strength   pantoprazole 40 MG tablet Commonly known as: Protonix Take 1 tablet (40 mg total) by mouth daily.   Renvela 800 MG tablet Generic drug: sevelamer carbonate Take 1,600 mg by mouth 3 (three) times daily with meals.   rOPINIRole 0.5 MG tablet Commonly known as: REQUIP Take 0.5 mg by mouth See  admin instructions. Take 0.5 at bedtime, take 0.5 mg before dialysis (Tues, Thurs, and Sat)   Sensipar 30 MG tablet Generic drug: cinacalcet Take 30 mg by mouth daily with supper.   tamsulosin 0.4 MG Caps capsule Commonly known as: FLOMAX Take 0.4 mg by mouth daily.   torsemide 100 MG tablet Commonly known as: DEMADEX Take 100 mg by mouth daily.   zolpidem 10 MG tablet Commonly known as: AMBIEN Take 10 mg by mouth at bedtime.         The results of significant diagnostics from this hospitalization (including imaging, microbiology, ancillary and laboratory) are listed below for reference.    Procedures and Diagnostic Studies:   DG Chest 2 View  Result Date: 02/19/2023 CLINICAL DATA:  Chest pain.  Cough and cold symptoms x3 days. EXAM: CHEST - 2 VIEW COMPARISON:  11/04/2022. FINDINGS: Right middle lobe consolidation. Stable cardiac and mediastinal contours with postoperative changes of median sternotomy and CABG. No pleural effusion or pneumothorax. IMPRESSION: Right middle lobe pneumonia. Electronically Signed   By: Emmit Alexanders M.D.   On: 02/19/2023 13:23     Labs:   Basic Metabolic Panel: Recent Labs  Lab 02/19/23 1807 02/20/23 0332 02/21/23 0320 02/22/23 0252 02/23/23 0749 02/24/23 0339  NA  --  134* 133* 133* 130* 133*  K  --  4.8 4.7 4.5 5.1 4.5  CL  --  94* 91* 92* 88* 92*  CO2  --  21* 24 22 19* 25  GLUCOSE  --  178* 169* 158* 176* 168*  BUN  --  72* 48* 59* 75* 41*  CREATININE  --  10.91* 7.82* 8.84* 9.63* 6.60*  CALCIUM  --  8.2* 8.6* 8.4* 8.6* 8.8*  MG 2.1 2.4  --   --   --   --   PHOS  --  8.4*  --  6.4*  --   --    GFR Estimated Creatinine Clearance: 12.3 mL/min (A) (by C-G formula based on SCr of 6.6 mg/dL (H)). Liver Function Tests: Recent Labs  Lab 02/20/23 0332 02/22/23 0252 02/23/23 0749 02/24/23 0339  AST 24  --  22 35  ALT 25  --  30 39  ALKPHOS 117  --  143* 155*  BILITOT 1.1  --  1.3* 1.3*  PROT 6.1*  --  6.5 6.4*  ALBUMIN  2.9* 2.9* 2.8* 2.7*   No results for input(s): "LIPASE", "AMYLASE" in the last 168 hours. No results for input(s): "AMMONIA" in the last 168 hours. Coagulation profile No results for input(s): "INR", "PROTIME" in the last 168 hours.  CBC: Recent Labs  Lab 02/20/23 0332 02/21/23 0320 02/22/23 0228 02/23/23 0749 02/24/23 0339  WBC 8.2 8.9 9.8 8.5 9.6  NEUTROABS  --  6.8 8.1* 6.8  --   HGB 10.9* 10.8* 10.9* 10.7* 10.7*  HCT 32.5* 31.9* 33.1* 32.7* 31.0*  MCV 100.0 100.0 102.2* 100.6* 97.8  PLT 108* 125* 121* 111* 133*   Cardiac Enzymes: No results for input(s): "CKTOTAL", "CKMB", "CKMBINDEX", "TROPONINI" in the last 168 hours. BNP: Invalid input(s): "POCBNP" CBG: Recent Labs  Lab 02/23/23 0758 02/23/23 1155 02/23/23 2027 02/24/23 0819 02/24/23 1117  GLUCAP 178* 285* 89 265* 102*   D-Dimer No results for input(s): "DDIMER" in the last 72 hours. Hgb A1c No results for input(s): "HGBA1C" in the last 72 hours. Lipid Profile No results for input(s): "CHOL", "HDL", "LDLCALC", "TRIG", "CHOLHDL", "LDLDIRECT" in the last 72 hours. Thyroid function studies No results for input(s): "TSH", "T4TOTAL", "T3FREE", "THYROIDAB" in the last 72 hours.  Invalid input(s): "FREET3" Anemia work up No results for input(s): "VITAMINB12", "FOLATE", "FERRITIN", "TIBC", "IRON", "RETICCTPCT" in the last 72 hours. Microbiology Recent Results (from the past 240 hour(s))  Culture, blood (routine x 2)     Status: None   Collection Time: 02/19/23  3:00 PM   Specimen: BLOOD  Result Value Ref Range Status   Specimen Description BLOOD LEFT ANTECUBITAL  Final   Special Requests   Final    BOTTLES DRAWN AEROBIC AND ANAEROBIC Blood Culture results may not be optimal due to an excessive volume of blood received in culture bottles   Culture   Final    NO GROWTH 5 DAYS Performed at [facility], [place] [address] [place], [place] [address]    Report Status 02/24/2023 FINAL  Final  Culture, blood  (routine x 2)     Status: None   Collection Time: 02/19/23  3:06 PM   Specimen: BLOOD  Result Value Ref Range Status   Specimen Description BLOOD SITE NOT SPECIFIED  Final   Special Requests   Final    BOTTLES DRAWN AEROBIC AND ANAEROBIC Blood Culture adequate volume   Culture   Final    NO GROWTH 5 DAYS Performed at [facility], [place] [address] [place], [place] [address]    Report Status 02/24/2023 FINAL  Final  Expectorated Sputum Assessment w Gram Stain, Rflx to Resp Cult     Status: None   Collection Time: 02/20/23  3:24 PM   Specimen: Expectorated Sputum  Result Value Ref Range Status   Specimen Description EXPECTORATED SPUTUM  Final   Special Requests NONE  Final   Sputum evaluation   Final    Sputum specimen not acceptable for testing.  Please recollect.    Rondel Oh, RN 02/20/23 1812 A. LAFRANCE Performed at [facility], [place] [address], [place], [place] [address]    Report Status 02/23/2023 FINAL  Final  Respiratory (~20 pathogens) panel by PCR     Status: Abnormal   Collection Time: 02/20/23  5:20 PM   Specimen: Nasopharyngeal  Swab; Respiratory  Result Value Ref Range Status   Adenovirus NOT DETECTED NOT DETECTED Final   Coronavirus 229E NOT DETECTED NOT DETECTED Final    Comment: (NOTE) The Coronavirus on the Respiratory Panel, DOES NOT test for the novel  Coronavirus (2019 nCoV)    Coronavirus HKU1 NOT DETECTED NOT DETECTED Final   Coronavirus NL63 DETECTED (A) NOT DETECTED Final   Coronavirus OC43 NOT DETECTED NOT DETECTED Final   Metapneumovirus NOT DETECTED NOT DETECTED Final   Rhinovirus / Enterovirus NOT DETECTED NOT DETECTED Final   Influenza A NOT DETECTED NOT DETECTED Final   Influenza B NOT DETECTED NOT DETECTED Final   Parainfluenza Virus 1 NOT DETECTED NOT DETECTED Final   Parainfluenza Virus 2 NOT DETECTED NOT DETECTED Final   Parainfluenza Virus 3 NOT DETECTED NOT DETECTED Final   Parainfluenza Virus 4 NOT DETECTED NOT DETECTED  Final   Respiratory Syncytial Virus NOT DETECTED NOT DETECTED Final   Bordetella pertussis NOT DETECTED NOT DETECTED Final   Bordetella Parapertussis NOT DETECTED NOT DETECTED Final   Chlamydophila pneumoniae NOT DETECTED NOT DETECTED Final   Mycoplasma pneumoniae NOT DETECTED NOT DETECTED Final    Comment: Performed at [facility], [place] [address] [place], [place] [address]  MRSA Next Gen by PCR, Nasal     Status: None   Collection Time: 02/20/23  5:20 PM   Specimen: Nasal Mucosa; Nasal Swab  Result Value Ref Range Status   MRSA by PCR Next Gen NOT DETECTED NOT DETECTED Final    Comment: (NOTE) The GeneXpert MRSA Assay (FDA approved for NASAL specimens only), is one component of a comprehensive MRSA colonization surveillance program. It is not intended to diagnose MRSA infection nor to guide or monitor treatment for MRSA infections. Test performance is not FDA approved in patients less than 21 years old. Performed at [facility], [place] [address], [place], [place] [address]   SARS Coronavirus 2 by RT PCR (hospital order, performed in [facility] hospital lab) *cepheid single result test* Anterior Nasal Swab     Status: None   Collection Time: 02/20/23  5:20 PM   Specimen: Anterior Nasal Swab  Result Value Ref Range Status   SARS Coronavirus 2 by RT PCR NEGATIVE NEGATIVE Final    Comment: Performed at [facility], [place] [address], [place], [place] [address]    Time coordinating discharge: 45 minutes  Signed: Ziere Docken  [facility] 02/24/2023, 2:28 PM

## 2023-02-24 NOTE — Progress Notes (Signed)
[name] to be D/C'd  per MD order.  Discussed with the patient and all questions fully answered.  VSS, Skin clean, dry and intact without evidence of skin break down, no evidence of skin tears noted.  IV catheter discontinued intact. Site without signs and symptoms of complications. Dressing and pressure applied.  An After Visit Summary was printed and given to the patient.  D/c education completed with patient/family including follow up instructions, medication list, d/c activities limitations if indicated, with other d/c instructions as indicated by MD - patient able to verbalize understanding, all questions fully answered.   Patient instructed to return to ED, call 911, or call MD for any changes in condition.   Patient to be escorted via [place], and D/C home via private auto.

## 2023-02-24 NOTE — Progress Notes (Signed)
[facility] Progress Note   Subjective:  Seen in room. C/o persistent coughing. Not c/o SOB today.  Upset about diet and fluid restrictions. SP HD last night w/ 4 L UF.   Objective Vitals:   02/23/23 1930 02/24/23 0500 02/24/23 0554 02/24/23 0838  BP: 111/67  97/62 96/60  Pulse: 79  79 78  Resp: 19  18 18   Temp: 98.1 F (36.7 C)  98.5 F (36.9 C) 98.3 F (36.8 C)  TempSrc: Oral  Oral Oral  SpO2: 94%   96%  Weight:  89.8 kg     Physical Exam General: Well appearing man, NAD Heart: RRR; no murmur Lungs: clear ant, post diffuse faint wheezing in the lower lobes, occ crackles, no ^wob Abdomen: soft Extremities: no LE edema Dialysis Access: RUE AVF + bruit   Dialysis Orders: [facility] - last HD 3/26 (left 89.2kg) 4h   400/A1.5    88.5kg     2K/2Ca bath  RUE AVF  Heparin none - no ESA - calcitriol 47mcg PO q HD   Assessment/Plan:  RML pneumonia: Started on Ceftriaxone + azithromycin. Viral panel was + for coronavirus NL63 (this is not the COVID-19 virus, pt tested negative for COVID-19).   Hypoxemic acute resp failure: is on room air yesterday and today, SpO2 96% range.   ESRD: Usual TTS HD. Had HD yesterday, next HD tomorrow. .   Hypertension/volume: BP controlled, no peripheral edema. No overt pulm edema on CXR. Was 4-5  kg up yest and is now 1kg over after 4 L UF. UF to dry wt tomorrow.   Anemia: Hgb 10.8 - trend for now, does not get ESA as outpatient.  Metabolic bone disease: Ca ok, Phos high. Continue home binders + VDRA.  Nutrition: Alb low, adding protein supplements. Complaining about restrictive diet --> changed to regular diet w/ 1600 cc fluid restriction for now.   CAD, Hx CABG  T2DM  Dispo -  hopefully soon, will d/w pmd  Kelly Splinter, MD 02/24/2023, 11:11 AM  Recent Labs  Lab 02/20/23 0332 02/21/23 0320 02/22/23 0252 02/23/23 0749 02/24/23 0339  HGB 10.9*   < >  --  10.7* 10.7*  ALBUMIN 2.9*  --  2.9* 2.8* 2.7*  CALCIUM 8.2*   < > 8.4* 8.6*  8.8*  PHOS 8.4*  --  6.4*  --   --   CREATININE 10.91*   < > 8.84* 9.63* 6.60*  K 4.8   < > 4.5 5.1 4.5   < > = values in this interval not displayed.     Inpatient medications:  (feeding supplement) PROSource Plus  30 mL Oral BID BM   aspirin EC  81 mg Oral Daily   atorvastatin  40 mg Oral QHS   calcitRIOL  1 mcg Oral Once per day on Tue Thu Sat   Chlorhexidine Gluconate Cloth  6 each Topical Q0600   gabapentin  200 mg Oral QHS   guaiFENesin  1,200 mg Oral BID   heparin  5,000 Units Subcutaneous Q8H   insulin aspart  0-9 Units Subcutaneous TID WC   midodrine  10 mg Oral Q T,Th,Sat-1800   multivitamin  1 tablet Oral QHS   pantoprazole  40 mg Oral Daily   rOPINIRole  0.5 mg Oral QHS   And   rOPINIRole  0.5 mg Oral Q T,Th,Sat-1800   sevelamer carbonate  1,600 mg Oral TID WC   sodium chloride flush  3 mL Intravenous Q12H   tamsulosin  0.4  mg Oral Daily   torsemide  100 mg Oral Daily   zolpidem  5 mg Oral QHS    azithromycin Stopped (02/22/23 1723)   azithromycin     cefTRIAXone (ROCEPHIN)  IV Stopped (02/22/23 1549)   cefTRIAXone (ROCEPHIN)  IV     acetaminophen **OR** acetaminophen, HYDROcodone bit-homatropine, ipratropium-albuterol, ondansetron (ZOFRAN) IV, polyethylene glycol

## 2023-02-24 NOTE — Progress Notes (Signed)
Physical Therapy Treatment Patient Details Name: John [name] MRN: [id number] DOB: November 11, 1951 Today's Date: 02/24/2023   History of Present Illness 72 y.o. male presents to [facility] hospital on 3.29/2024 with SOB and weakness. Chest x-ray demonstrates PNA. Pt also experiencing diarrhea. PMH includes endocarditis, PAD, CABG, VATS and decortication, ESRD, CHF.    PT Comments    Pt was received sitting EOB and agreeable to session. Pt was able to tolerate increased gait distance this session with close supervision due to L lateral drift. Discussed use of AD, such as a cane, instead of furniture at home to prevent falls. Pt reports having a single point cane at home. Pt able to demonstrate safe stair trial with min guard for safety and cues for technique. Pt required multiple standing and seated recovery breaks throughout the session. Pt continues to benefit from PT services to progress toward functional mobility goals.    Recommendations for follow up therapy are one component of a multi-disciplinary discharge planning process, led by the attending physician.  Recommendations may be updated based on patient status, additional functional criteria and insurance authorization.  Follow Up Recommendations       Assistance Recommended at Discharge PRN  Patient can return home with the following Assistance with cooking/housework;Assist for transportation;Help with stairs or ramp for entrance   Equipment Recommendations  None recommended by PT    Recommendations for Other Services       Precautions / Restrictions Precautions Precautions: Fall Precaution Comments: monitor sats Restrictions Weight Bearing Restrictions: No     Mobility  Bed Mobility               General bed mobility comments: Pt sitting on EOB upon arrival and ending session in recliner    Transfers Overall transfer level: Independent Equipment used: None                     Ambulation/Gait Ambulation/Gait assistance: Supervision Gait Distance (Feet): 275 Feet Assistive device: None Gait Pattern/deviations: Step-through pattern, Drifts right/left       General Gait Details: Pt demonstrating slowed gait requiring intermittent LUE support on counter and wall due to L lateral drift. Supervision for safety and pt requiring intermittent standing and seated recovery breaks.   Stairs Stairs: Yes Stairs assistance: Min guard Stair Management: One rail Right Number of Stairs: 4 General stair comments: Pt able to complete safe stair trial with step-over-step pattern and min guard for safety. Cues for technique.      Balance Overall balance assessment: Needs assistance Sitting-balance support: No upper extremity supported, Feet supported Sitting balance-Leahy Scale: Good     Standing balance support: No upper extremity supported, During functional activity Standing balance-Leahy Scale: Fair Standing balance comment: Pt required intermittent UE support due to L lateral drift                            Cognition Arousal/Alertness: Awake/alert Behavior During Therapy: WFL for tasks assessed/performed Overall Cognitive Status: Within Functional Limits for tasks assessed                                          Exercises      General Comments        Pertinent Vitals/Pain Pain Assessment Pain Assessment: Faces Faces Pain Scale: Hurts a little bit Pain Location: R foot Pain Descriptors /  Indicators: Aching Pain Intervention(s): Limited activity within patient's tolerance, Monitored during session     PT Goals (current goals can now be found in the care plan section) Acute Rehab PT Goals Patient Stated Goal: to go home, regain endurance PT Goal Formulation: With patient Time For Goal Achievement: 03/07/23 Potential to Achieve Goals: Good Progress towards PT goals: Progressing toward goals    Frequency    Min  3X/week      PT Plan Current plan remains appropriate       AM-PAC PT "6 Clicks" Mobility   Outcome Measure  Help needed turning from your back to your side while in a flat bed without using bedrails?: None Help needed moving from lying on your back to sitting on the side of a flat bed without using bedrails?: None Help needed moving to and from a bed to a chair (including a wheelchair)?: None Help needed standing up from a chair using your arms (e.g., wheelchair or bedside chair)?: None Help needed to walk in hospital room?: A Little Help needed climbing 3-5 steps with a railing? : A Little 6 Click Score: 22    End of Session Equipment Utilized During Treatment: Gait belt Activity Tolerance: Patient tolerated treatment well Patient left: in chair;with call bell/phone within reach Nurse Communication: Mobility status PT Visit Diagnosis: Other abnormalities of gait and mobility (R26.89);Muscle weakness (generalized) (M62.81)     Time: 1410-1430 PT Time Calculation (min) (ACUTE ONLY): 20 min  Charges:  $Gait Training: 8-22 mins                     Michelle Nasuti, PTA Acute Rehabilitation Services Secure Chat Preferred  [phone]    Michelle Nasuti 02/24/2023, 2:42 PM

## 2023-02-24 NOTE — Progress Notes (Signed)
   02/24/23 1000  Mobility  Activity Ambulated with assistance in hallway  Level of Assistance Contact guard assist, steadying assist  Assistive Device None  Distance Ambulated (ft) 250 ft  Activity Response Tolerated well  Mobility Referral Yes  $Mobility charge 1 Mobility   Mobility Specialist Progress Note  Pt was in bed and agreeable. X1 standing break d/t fatigue. Returned EOB w/ all needs met and call bell in reach.   Lucious Groves Mobility Specialist  Please contact via SecureChat or Rehab office at [phone]

## 2023-02-26 ENCOUNTER — Telehealth: Payer: Self-pay | Admitting: Nephrology

## 2023-02-26 NOTE — Telephone Encounter (Signed)
Transition of Care - Initial Contact from Inpatient Facility  Date of discharge: 02/24/2023 Date of contact: 02/26/2023  Method: Phone Spoke to: Patient  Patient contacted to discuss transition of care from recent inpatient hospitalization. Patient was admitted to [facility] from 3/29-02/24/23 with discharge diagnosis of community acquired pneumonia.   The discharge medication list was reviewed. Patient understands the changes and has no concerns.   Patient will return to his/her outpatient HD unit on: His next dialysis will be Saturday 4/6.   No other concerns at this time.

## 2023-03-02 ENCOUNTER — Other Ambulatory Visit: Payer: Self-pay | Admitting: Emergency Medicine

## 2023-03-03 ENCOUNTER — Encounter: Payer: Medicare HMO | Admitting: Internal Medicine
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
## 2023-03-16 ENCOUNTER — Ambulatory Visit: Payer: Medicare HMO | Admitting: Thoracic Surgery (Cardiothoracic Vascular Surgery)

## 2023-03-17 ENCOUNTER — Encounter: Payer: Self-pay | Admitting: Physician Assistant

## 2023-03-17 ENCOUNTER — Ambulatory Visit (INDEPENDENT_AMBULATORY_CARE_PROVIDER_SITE_OTHER): Payer: Medicare HMO | Admitting: Physician Assistant

## 2023-03-17 ENCOUNTER — Encounter: Payer: Self-pay | Admitting: Thoracic Surgery (Cardiothoracic Vascular Surgery)

## 2023-03-17 ENCOUNTER — Other Ambulatory Visit (INDEPENDENT_AMBULATORY_CARE_PROVIDER_SITE_OTHER): Payer: Medicare HMO

## 2023-03-17 VITALS — BP 122/58 | HR 68 | Ht 74.0 in | Wt 196.1 lb

## 2023-03-17 DIAGNOSIS — R748 Abnormal levels of other serum enzymes: Secondary | ICD-10-CM

## 2023-03-17 DIAGNOSIS — J189 Pneumonia, unspecified organism: Secondary | ICD-10-CM

## 2023-03-17 DIAGNOSIS — I25810 Atherosclerosis of coronary artery bypass graft(s) without angina pectoris: Secondary | ICD-10-CM

## 2023-03-17 DIAGNOSIS — D649 Anemia, unspecified: Secondary | ICD-10-CM

## 2023-03-17 DIAGNOSIS — N186 End stage renal disease: Secondary | ICD-10-CM

## 2023-03-17 DIAGNOSIS — K219 Gastro-esophageal reflux disease without esophagitis: Secondary | ICD-10-CM

## 2023-03-17 DIAGNOSIS — I5022 Chronic systolic (congestive) heart failure: Secondary | ICD-10-CM

## 2023-03-17 LAB — COMPREHENSIVE METABOLIC PANEL
ALT: 21 U/L (ref 0–53)
AST: 19 U/L (ref 0–37)
Albumin: 3.6 g/dL (ref 3.5–5.2)
Alkaline Phosphatase: 150 U/L — ABNORMAL HIGH (ref 39–117)
BUN: 17 mg/dL (ref 6–23)
CO2: 33 mEq/L — ABNORMAL HIGH (ref 19–32)
Calcium: 8.8 mg/dL (ref 8.4–10.5)
Chloride: 92 mEq/L — ABNORMAL LOW (ref 96–112)
Creatinine, Ser: 6.08 mg/dL (ref 0.40–1.50)
GFR: 8.81 mL/min — CL (ref >60.00)
Glucose, Bld: 157 mg/dL — ABNORMAL HIGH (ref 70–99)
Potassium: 3.6 mEq/L (ref 3.5–5.1)
Sodium: 137 mEq/L (ref 135–145)
Total Bilirubin: 0.8 mg/dL (ref 0.2–1.2)
Total Protein: 6.8 g/dL (ref 6.0–8.3)

## 2023-03-17 LAB — IBC + FERRITIN
Ferritin: 1310.5 ng/mL — ABNORMAL HIGH (ref 22.0–322.0)
Iron: 87 ug/dL (ref 42–165)
Saturation Ratios: 44.7 % (ref 20.0–50.0)
TIBC: 194.6 ug/dL — ABNORMAL LOW (ref 250.0–450.0)
Transferrin: 139 mg/dL — ABNORMAL LOW (ref 212.0–360.0)

## 2023-03-17 LAB — CBC WITH DIFFERENTIAL/PLATELET
Basophils Absolute: 0.1 10*3/uL (ref 0.0–0.1)
Basophils Relative: 1 % (ref 0.0–3.0)
Eosinophils Absolute: 0.3 10*3/uL (ref 0.0–0.7)
Eosinophils Relative: 5.1 % — ABNORMAL HIGH (ref 0.0–5.0)
HCT: 32.2 % — ABNORMAL LOW (ref 39.0–52.0)
Hemoglobin: 10.8 g/dL — ABNORMAL LOW (ref 13.0–17.0)
Lymphocytes Relative: 20.2 % (ref 12.0–46.0)
Lymphs Abs: 1 10*3/uL (ref 0.7–4.0)
MCHC: 33.5 g/dL (ref 30.0–36.0)
MCV: 99.9 fl (ref 78.0–100.0)
Monocytes Absolute: 0.6 10*3/uL (ref 0.1–1.0)
Monocytes Relative: 11.8 % (ref 3.0–12.0)
Neutro Abs: 3.1 10*3/uL (ref 1.4–7.7)
Neutrophils Relative %: 61.9 % (ref 43.0–77.0)
Platelets: 149 10*3/uL — ABNORMAL LOW (ref 150.0–400.0)
RBC: 3.23 Mil/uL — ABNORMAL LOW (ref 4.22–5.81)
RDW: 14.9 % (ref 11.5–15.5)
WBC: 5 10*3/uL (ref 4.0–10.5)

## 2023-03-17 NOTE — Patient Instructions (Addendum)
You are very high risk for screening colonoscopy and with your age and risk factors I would not suggest this.  We are going to check for a true iron def anemia and will evaluate you from that.  Or if you have any abnormal symptoms, rectal bleeding, trouble swallowing, please contact our office sooner.   Your provider has requested that you go to the basement level for lab work before leaving today. Press "B" on the elevator. The lab is located at the first door on the left as you exit the elevator.   If you have continuing issues after antibiotic, suggest CT chest  Please take your proton pump inhibitor medication, protonix daily  Please take this medication 30 minutes to 1 hour before meals- this makes it more effective.  Avoid spicy and acidic foods Avoid fatty foods Limit your intake of coffee, tea, alcohol, and carbonated drinks Work to maintain a healthy weight Keep the head of the bed elevated at least 3 inches with blocks or a wedge pillow if you are having any nighttime symptoms Stay upright for 2 hours after eating Avoid meals and snacks three to four hours before bedtime  I appreciate the  opportunity to care for you  Thank You   [facility]

## 2023-03-17 NOTE — Progress Notes (Signed)
Agree with assessment/plan.  Raj Ramiel Forti, MD [facility] [phone]  

## 2023-05-13 NOTE — Progress Notes (Cosign Needed Addendum)
ADVANCED HF CLINIC NOTE   Primary Care: Erskine Emery, NP Primary Cardiologist: Gypsy Balsam, MD HF Cardiologist: Dr. Gala Romney   HPI:  John [name] is a 72 y.o.. male history of coronary artery disease with prior PCI to the RCA, DMII, HTN, ascending aortic aneurysm followed by TCTS, PAD s/p aortofemoral bypass and ESRD on HD,     Presented 2020 w/ CP and elevated Hstop: Cath revealed severe multivessel disease. Underwent a four-vessel CABG 5/20 with LIMA to LAD, SVG to diagonal, SVG to OM, and SVG to PDA.    Found to have chronic pleural effusion 3/21. Had video bronchoscopy done as well as left VATS with drainage of empyema as well as decortication and intercostal nerve block.    Had progressively worsening renal failure until October 21' when he started HD.  10/22 had echo d/t DOE. EF was 35 to 40%.  Stress test has did not show any significant ischemia. GDMT started but difficulty managing 2/2 hypotension. Echo 9/23 EF down to 20 to 25%, mild LVH. RV size is normal, severely elevated pulmonary artery systolic pressure, mild MR, mod TR. Referred to AHF clinic for further management with worsening HFrEF.   PYP 11/23 negative   Dr. Gala Romney first saw him 9/23, and he was markedly volume overloaded. Dr. Gala Romney d/w Dr. Glenna Fellows and the plan was to pull more fluid with HD and decrease his dry weight. They were able to get him down from 208 to 195 lbs.  Follow up 1/24,  REDs 41%, volume remained up but improving. NYHA IIIb.  Echo 01/04/23 EF 20-25% grade II DD, RV moderately down, severe MR, moderate TR  Last seen for follow-up 02/24. Blood pressure dropping with HD requiring midodrine. Still very short of breath despite better volume control with HD.   TEE 02/09/23: EF 20%, RV severely HK, mod BAE, 2-3+ central MR, moderate TR  RHC 02/09/23: Markedly elevated filling pressures with normal output (in setting of AVF), prominent v waves in PCWP tracing suggesting  significant MR, tortuous vessles only able to cannulate LIMA to LAD and SVG to RCA both of which were patent.   He was admitted 02/19/23 with viral PNA (respiratory panel positive for coronavirus NL 63) and acute on chronic CHF. He did receive course of abx. Volume was managed with iHD.  He was referred for urgent follow-up by HD center on 06/18 d/t complaints of dyspnea and lack of energy. The patient reports gradually worsening dyspnea and decline in activity tolerance over the last year. He is exhausted walking a couple hundred feet. His legs get really weak and feel like they may give out. He is frustrated that he doesn't have the energy to garden or participate in other activities he enjoys. States he's just "tired of it". No orthopnea or PND but does get coughing fits when he attempts to lie down.   It has been difficult to get volume off during HD d/t hypotension. He is volume overloaded despite 9 lb weight loss since last visit. Gets full easily when eating.   ReDS up to 57% today  Cardiac studies: - TEE 02/09/23: EF 20%, RV severely HK, mod BAE, moderate TR  - [facility] 02/09/23: Markedly elevated filling pressures with normal output (in setting of AVF), prominent v waves in PCWP tracing suggesting significant MR, tortuous vessles only able to cannulate LIMA to LAD and SVG to RCA both of which were patent.  - Echo 01/04/23 EF 20-25% grade II DD, RV moderately down, severe  MR, moderate TR  - Echo 9/23 EF down to 20 to 25%. mild LVH. RV size is normal. Severely elevated pulmonary artery systolic pressure. LA mildly elevated, RA mod dilated. Mild MR, Mod TR.   - Echo 3/23 EF 30-35%  - Echo 10/22 EF 35-40%  - Lexiscan 2/22 normal  - Echo 8/21, TEE 2/21, Echo 9/21 EF 40-45%  - Echo 5/20 EF 50-55%  - LHC 2020: severe multivessel disease  - Echo 10/18 EF 55-60%    Past Medical History:  Diagnosis Date   Allergy, unspecified, initial encounter 08/30/2020   Anaphylactic shock,  unspecified, initial encounter 08/30/2020   Anemia in chronic kidney disease 08/30/2020   Arthritis    Back pain 09/07/2017   Carotid artery stenosis    40-59% LICA, 1-39% RICA 03/27/19 [place]   Chest pain 03/24/2019   Chronic infective endocarditis    Chronic kidney disease (CKD), stage IV (severe) (HCC) 12/01/2018   CKD (chronic kidney disease), stage V (HCC) 03/24/2019   Coronary artery disease    Dependence on renal dialysis (HCC) 08/31/2020   Diabetes mellitus without complication (HCC)    Disorder of phosphorus metabolism, unspecified 08/31/2020   DM (diabetes mellitus), type 2 with renal complications (HCC) 09/07/2017   Dyspnea    Encounter for screening for respiratory tuberculosis 08/30/2020   End stage renal disease (HCC) 09/07/2017   ESRD (end stage renal disease) (HCC) 08/27/2020   Essential (primary) hypertension 09/07/2017   Fever    History of endocarditis 01/09/2020   Hypercalcemia 10/08/2020   Hyperlipidemia, unspecified 09/07/2017   Hypertension    Hypokalemia 11/13/2021   Iron deficiency anemia, unspecified 08/30/2020   Low back pain 09/07/2017   MI (myocardial infarction) (HCC)    MSSA bacteremia 09/10/2017   Non-ST elevation (NSTEMI) myocardial infarction (HCC) 01/09/2020   NSTEMI (non-ST elevated myocardial infarction) (HCC) 03/24/2019   Other specified arthritis, other site 08/31/2020   Pain, unspecified 08/30/2020   Personal history of other diseases of the circulatory system 08/31/2020   Presence of aortocoronary bypass graft 08/31/2020   Pruritus, unspecified 08/30/2020   Renal disorder    Restless legs syndrome 09/06/2020   S/P CABG x 4 04/03/2019   S/P Video Bronchoscopy, Right VATS with drainage of pleural effusion, decortication, intercostal nerve block 02/20/2020   Secondary hyperparathyroidism of renal origin (HCC) 08/30/2020   Thoracic aortic aneurysm (TAA) (HCC)    4.3 cm ascending TAA 02/02/20 CT   Type 2 diabetes mellitus with diabetic nephropathy (HCC) 08/30/2020    Current Outpatient Medications  Medication Sig Dispense Refill   acetaminophen (TYLENOL) 325 MG tablet Take 650 mg by mouth every 6 (six) hours as needed for moderate pain.     aspirin EC 81 MG tablet Take 81 mg by mouth daily.     atorvastatin (LIPITOR) 40 MG tablet Take 40 mg by mouth at bedtime.     b complex-vitamin c-folic acid (NEPHRO-VITE) 0.8 MG TABS tablet Take 1 tablet by mouth at bedtime.     calcitRIOL (ROCALTROL) 0.5 MCG capsule Take 2 capsules (1 mcg total) by mouth 3 (three) times a week. 24 capsule 2   clobetasol (TEMOVATE) 0.05 % external solution Apply 1 Application topically 2 (two) times daily.     Dulaglutide 1.5 MG/0.5ML SOPN Inject 1.5 mg into the skin every Sunday. Trulicity     fluticasone (FLONASE) 50 MCG/ACT nasal spray Place 1 spray into both nostrils daily as needed for allergies or rhinitis.     gabapentin (NEURONTIN) 100 MG capsule  Take 2 capsules (200 mg total) by mouth at bedtime.     glipiZIDE (GLUCOTROL XL) 2.5 MG 24 hr tablet Take 2.5 mg by mouth 2 (two) times daily.     ibuprofen (ADVIL) 200 MG tablet Take 200 mg by mouth daily as needed for moderate pain.     ketoconazole (NIZORAL) 2 % shampoo Apply 1 Application topically 3 (three) times a week.     linagliptin (TRADJENTA) 5 MG TABS tablet Take 5 mg by mouth daily.     midodrine (PROAMATINE) 10 MG tablet Take 1 tablet (10 mg total) by mouth 3 (three) times a week. Before Dialysis every Mon, Wed, Fri (Patient taking differently: Take 10 mg by mouth 3 (three) times a week. Before Dialysis every Tues, Thurs, and Sat) 45 tablet 3   mupirocin ointment (BACTROBAN) 2 % Apply 1 Application topically 3 (three) times daily.     Nutritional Supplements (,FEEDING SUPPLEMENT, PROSOURCE PLUS) liquid Take 30 mLs by mouth 2 (two) times daily between meals.     OVER THE COUNTER MEDICATION Place 1 drop into both eyes 2 (two) times daily as needed (dry eyes). Hyper Tears eye drops/Unknown strength     pantoprazole  (PROTONIX) 40 MG tablet TAKE 1 TABLET BY MOUTH EVERY DAY 90 tablet 1   RENVELA 800 MG tablet Take 1,600 mg by mouth 3 (three) times daily with meals.     rOPINIRole (REQUIP) 0.5 MG tablet Take 0.5 mg by mouth See admin instructions. Take 0.5 at bedtime, take 0.5 mg before dialysis (Tues, Thurs, and Sat)     SENSIPAR 30 MG tablet Take 30 mg by mouth daily with supper.     tamsulosin (FLOMAX) 0.4 MG CAPS capsule Take 0.4 mg by mouth daily.     torsemide (DEMADEX) 100 MG tablet Take 100 mg by mouth daily.     zolpidem (AMBIEN) 10 MG tablet Take 10 mg by mouth at bedtime.     Current Facility-Administered Medications  Medication Dose Route Frequency Provider Last Rate Last Admin   regadenoson (LEXISCAN) injection SOLN 0.4 mg  0.4 mg Intravenous Once Revankar, Aundra Dubin, MD       technetium tetrofosmin (TC-MYOVIEW) injection 10.9 millicurie  10.9 millicurie Intravenous Once PRN Revankar, Aundra Dubin, MD       technetium tetrofosmin (TC-MYOVIEW) injection 31.4 millicurie  31.4 millicurie Intravenous Once PRN Revankar, Aundra Dubin, MD       Allergies  Allergen Reactions   Penicillins Rash and Other (See Comments)    Did it involve swelling of the face/tongue/throat, SOB, or low BP? No Did it involve sudden or severe rash/hives, skin peeling, or any reaction on the inside of your mouth or nose? Yes Did you need to seek medical attention at a hospital or doctor's office? Yes When did it last happen?      Childhood allergy If all above answers are "NO", may proceed with cephalosporin use.    Social History   Socioeconomic History   Marital status: Married    Spouse name: Not on file   Number of children: Not on file   Years of education: Not on file   Highest education level: Not on file  Occupational History   Not on file  Tobacco Use   Smoking status: Former    Packs/day: 2.00    Years: 30.00    Additional pack years: 0.00    Total pack years: 60.00    Types: Cigarettes    Start date: 1968  Quit date: 36    Years since quitting: 26.4   Smokeless tobacco: Never  Vaping Use   Vaping Use: Never used  Substance and Sexual Activity   Alcohol use: Yes    Comment: Occasionally.   Drug use: Never   Sexual activity: Not on file  Other Topics Concern   Not on file  Social History Narrative   Not on file   Social Determinants of Health   Financial Resource Strain: Not on file  Food Insecurity: Not on file  Transportation Needs: Not on file  Physical Activity: Not on file  Stress: Not on file  Social Connections: Not on file  Intimate Partner Violence: Not on file   Family History  Problem Relation Age of Onset   Cancer Maternal Grandmother    Brain cancer Maternal Grandmother    Colon cancer Neg Hx    Rectal cancer Neg Hx    Stomach cancer Neg Hx    There were no vitals taken for this visit.  Wt Readings from Last 3 Encounters:  03/17/23 89 kg (196 lb 2 oz)  02/24/23 91.6 kg (201 lb 15.1 oz)  02/09/23 92.5 kg (204 lb)   PHYSICAL EXAM: General:  Chronically ill appearing elderly male HEENT: normal Neck: supple. JVP 8-10. Carotids 2+ bilat; no bruits.  Cor: PMI nondisplaced. Regular rate & rhythm. No rubs, gallops, 2/6 MR mumur Lungs: clear Abdomen: soft, nontender, + distended. Extremities: no cyanosis, clubbing, rash, 2+ LE edema Neuro: alert & orientedx3. Affect pleasant   ECG (personally reviewed): NSR 84 bpm  REDs: 50%-->41%-->34% --->57%  ASSESSMENT & PLAN: Chronic systolic heart failure - Echo 40/98 EF 55-60% - CABG x4 2020 - Echo 8/21, TEE 2/21, Echo 9/21 EF 40-45% - Echo 10/22 EF 35-40% - EF 3/23 EF 30-35% - Echo 9/23 EF 20 to 25%. mild LVH. RV size is normal. Severely elevated pulmonary artery systolic pressure. Mild MR, Mod TR.  - Echo today 01/04/23 showed EF EF 20-25% grade II DD, RV moderately down, RVSP 70 mmHg, severe MR, moderate TR - PYP negative - TEE 02/09/23: EF 20%, RV severely HK, mod BAE, 2-3+ central MR, moderate TR - R/LHC  02/09/23: Markedly elevated filling pressures with normal output (in setting of AVF), prominent v waves in PCWP tracing suggesting significant MR, tortuous vessles only able to cannulate LIMA to LAD and SVG to RCA both of which were patent. - NYHA IIIB. Dr. Gala Romney reviewed TEE and [facility] with structural team. MR not severe enough for mitraclip and likely wound not improve his symptoms at this point. Suspect symptoms due to progressive heart failure and residual volume overload d/t inability to tolerate HD. - Dr. Gala Romney spoke with Nephrology. Will decrease his dry weight and try to pull more volume with HD sessions. Takes torsemide but does not appear that he is making enough urine that increasing dose or adding metolazone would make much difference in volume status. - Increase midodrine to 20 mg TID to help with ability to tolerate HD - Unable to start GDMT w/ ESRD requiring midodrine for BP support. - Not candidate for VAD/transplant with age and ESRD. He appears end-stage. If not improving over next couple of months may need to consider transition to hospice care. Will arrange Palliative Care referral to continue GOC discussions.  2. MR - Severe on TTE in 02/24 - Moderate in severity on TEE 03/34 - RHC 03/24 with prominent v waves in PCWP tracing suggesting significant MR - Not severe enough for MitraClip as  above.   3. CAD, ICM s/p CABG x4 - Previous smoker, quit in 14' - s/p CABG x4 2020 - LIMA to LAD and SVG to RCA patent on LHC in 03/24. Unable to cannulate other vessels - Continue ASA and statin  4. ESRD on HD - T/Th/Sat schedule. - Discussed with Nephrology as above  5. HTN - resolved. Now requiring midodrine - Has been getting hypotensive with HD.  6. DM II - Managed by PCP. - Hgb A1c last 6.3   7. SOB/Persistent cough - Follows w/ Dr. Delton Coombes. - Suspect 2/2 volume overload.  Follow-up: 4 weeks with Dr. Gala Romney  Markcus Lazenby N, PA-C  1:20 PM  Patient seen and  examined with the above-signed Advanced Practice Provider and/or Housestaff. I personally reviewed laboratory data, imaging studies and relevant notes. I independently examined the patient and formulated the important aspects of the plan. I have edited the note to reflect any of my changes or salient points. I have personally discussed the plan with the patient and/or family.  72 y/o male as above with severe systolic HF, MR and ESRD.   Seen today for work-in visit due to worsening HF. Now with NYHA IV symptoms. Having trouble pulling volume in HD due to hypotension despite midodrine at 10 tid. Losing weight. ReDS 51%  General:  Weak appearing. No resp difficulty HEENT: normal Neck: supple. JVP to ear with prominent v waves Carotids 2+ bilat; no bruits. No lymphadenopathy or thryomegaly appreciated. Cor: PMI nondisplaced. Regular rate & rhythm. 2/6 MR/TR Lungs: basilar crackles Abdomen: soft, nontender, + distended. No hepatosplenomegaly. No bruits or masses. Good bowel sounds. Extremities: no cyanosis, clubbing, rash, 3+ edema Neuro: alert & orientedx3, cranial nerves grossly intact. moves all 4 extremities w/o difficulty. Affect pleasant  I suspect he is end-stage with worsening HF and ESRD. Now unable to dialyze effectively due to hypotension and is markedly volume overloaded. I reviewed echo and no role for MitraClip as potential salvage therapy. Will increase midodrine to 20 tid and I reached out to Nephrology personally to ask them to try to push his dry weight down.   I spoke with his wife by phone and we all discussed his condition and how concerned I am. Will arrange for Home Palliative Care consult to discuss GOC.   Total time spent 45 minutes. Over half that time spent discussing above.   Arvilla Meres, MD  10:30 PM

## 2023-05-14 ENCOUNTER — Ambulatory Visit (HOSPITAL_COMMUNITY)
Admission: RE | Admit: 2023-05-14 | Discharge: 2023-05-14 | Disposition: A | Discharge status: Home / Self Care | Payer: Medicare HMO | Source: Ambulatory Visit | Attending: Physician Assistant | Admitting: Physician Assistant

## 2023-05-14 ENCOUNTER — Encounter (HOSPITAL_COMMUNITY): Payer: Self-pay

## 2023-05-14 VITALS — BP 118/68 | HR 84 | Ht 74.0 in | Wt 196.4 lb

## 2023-05-14 DIAGNOSIS — Z87891 Personal history of nicotine dependence: Secondary | ICD-10-CM | POA: Diagnosis not present

## 2023-05-14 DIAGNOSIS — R0602 Shortness of breath: Secondary | ICD-10-CM | POA: Diagnosis not present

## 2023-05-14 DIAGNOSIS — I5022 Chronic systolic (congestive) heart failure: Secondary | ICD-10-CM | POA: Insufficient documentation

## 2023-05-14 DIAGNOSIS — Z951 Presence of aortocoronary bypass graft: Secondary | ICD-10-CM | POA: Diagnosis not present

## 2023-05-14 DIAGNOSIS — E1122 Type 2 diabetes mellitus with diabetic chronic kidney disease: Secondary | ICD-10-CM | POA: Insufficient documentation

## 2023-05-14 DIAGNOSIS — I953 Hypotension of hemodialysis: Secondary | ICD-10-CM | POA: Diagnosis not present

## 2023-05-14 DIAGNOSIS — I2510 Atherosclerotic heart disease of native coronary artery without angina pectoris: Secondary | ICD-10-CM | POA: Diagnosis not present

## 2023-05-14 DIAGNOSIS — I132 Hypertensive heart and chronic kidney disease with heart failure and with stage 5 chronic kidney disease, or end stage renal disease: Secondary | ICD-10-CM | POA: Insufficient documentation

## 2023-05-14 DIAGNOSIS — R053 Chronic cough: Secondary | ICD-10-CM | POA: Diagnosis not present

## 2023-05-14 DIAGNOSIS — I2584 Coronary atherosclerosis due to calcified coronary lesion: Secondary | ICD-10-CM

## 2023-05-14 DIAGNOSIS — N186 End stage renal disease: Secondary | ICD-10-CM | POA: Diagnosis not present

## 2023-05-14 DIAGNOSIS — I340 Nonrheumatic mitral (valve) insufficiency: Secondary | ICD-10-CM

## 2023-05-14 DIAGNOSIS — Z992 Dependence on renal dialysis: Secondary | ICD-10-CM | POA: Insufficient documentation

## 2023-05-14 MED ORDER — MIDODRINE HCL 10 MG PO TABS: ORAL_TABLET | ORAL | 3 refills | Status: DC

## 2023-05-14 NOTE — Addendum Note (Signed)
Encounter addended by: Linda Hedges, RN on: 05/14/2023 11:05 AM  Actions taken: Order list changed

## 2023-05-14 NOTE — Addendum Note (Signed)
Encounter addended by: Andrey Farmer, PA-C on: 05/14/2023 11:05 AM  Actions taken: Order Reconciliation Section accessed

## 2023-05-14 NOTE — Patient Instructions (Signed)
INCREASE Midodrine to 20 mg Three times a day on Tuesday, Thursdays and Fridays.  You have been referred to Palliative Care. They will call you to arrange your appointment.   Your physician recommends that you schedule a follow-up appointment in: 4 weeks   If you have any questions or concerns before your next appointment please send [place] a message through [place] or call our office at [phone].    TO LEAVE A MESSAGE FOR THE NURSE SELECT OPTION 2, PLEASE LEAVE A MESSAGE INCLUDING: YOUR NAME DATE OF BIRTH CALL BACK NUMBER REASON FOR CALL**this is important as we prioritize the call backs  YOU WILL RECEIVE A CALL BACK THE SAME DAY AS LONG AS YOU CALL BEFORE 4:00 PM  At the [facility], you and your health needs are our priority. As part of our continuing mission to provide you with exceptional heart care, we have created designated Provider Care Teams. These Care Teams include your primary Cardiologist (physician) and Advanced Practice Providers (APPs- Physician Assistants and Nurse Practitioners) who all work together to provide you with the care you need, when you need it.   You may see any of the following providers on your designated Care Team at your next follow up: Dr Arvilla Meres Dr Marca Ancona Dr. Marcos Eke, NP Robbie Lis, [place] [facility] [place], [place] Brynda Peon, NP Karle Plumber, PharmD   Please be sure to bring in all your medications bottles to every appointment.    Thank you for choosing [facility]

## 2023-05-14 NOTE — Progress Notes (Signed)
ReDS Vest / Clip - 05/14/23 0817       ReDS Vest / Clip   Station Marker C    Ruler Value 25    ReDS Value Range High volume overload    ReDS Actual Value 57

## 2023-05-16 NOTE — Addendum Note (Signed)
Encounter addended by: Dolores Patty, MD on: 05/16/2023 10:30 PM  Actions taken: Level of Service modified, Clinical Note Signed

## 2023-05-17 ENCOUNTER — Telehealth: Payer: Self-pay

## 2023-05-17 NOTE — Telephone Encounter (Signed)
PC SW connected with patient to discuss new referral. Patient share he is unsure if in person or virtual support was what he needed and that he was going to consult with referring provide and outreach S at later time on how to proceed.

## 2023-05-25 ENCOUNTER — Ambulatory Visit (INDEPENDENT_AMBULATORY_CARE_PROVIDER_SITE_OTHER): Payer: Medicare HMO | Admitting: Thoracic Surgery (Cardiothoracic Vascular Surgery)

## 2023-05-25 VITALS — BP 101/66 | HR 82 | Resp 20 | Ht 74.0 in | Wt 195.0 lb

## 2023-05-25 DIAGNOSIS — I7121 Aneurysm of the ascending aorta, without rupture: Secondary | ICD-10-CM | POA: Diagnosis not present

## 2023-05-25 NOTE — Progress Notes (Signed)
[address]       John [name] [address]             [phone]     HPI: John [name] returns for follow-up of his thoracic aortic aneurysm.  John [name] is a 72 year old man with a history of a non-ST elevation MI, coronary bypass grafting, biventricular failure, end-stage renal disease, hemodialysis, hypertension, hyperlipidemia, thoracic aortic aneurysms, thoracic aortic atherosclerosis, type 2 diabetes, back pain, arthritis, and pleural effusion requiring VATS for decortication.  I saw him about a year ago with thoracic aneurysms.  He presented to the ED with shortness of breath and had a CT to rule out pulmonary embolus.  By report there was a 4.5 cm ascending aneurysm and a 4.1 cm descending aneurysm.  He was advised to follow-up in 6 months with a repeat CT of the chest.  He had the CT done in March but has been unable to follow-up prior to today.  He has been having a lot of difficulty with heart failure and difficulties with dialysis.  No chest pain.  Does get short of breath and fatigue with activity.  Biggest complaint is a dry cough.  Some swelling in his legs.  Past Medical History:  Diagnosis Date   Allergy, unspecified, initial encounter 08/30/2020   Anaphylactic shock, unspecified, initial encounter 08/30/2020   Anemia in chronic kidney disease 08/30/2020   Arthritis    Back pain 09/07/2017   Carotid artery stenosis    40-59% LICA, 1-39% RICA 03/27/19 [place]   Chest pain 03/24/2019   Chronic infective endocarditis    Chronic kidney disease (CKD), stage IV (severe) (HCC) 12/01/2018   CKD (chronic kidney disease), stage V (HCC) 03/24/2019   Coronary artery disease    Dependence on renal dialysis (HCC) 08/31/2020   Diabetes mellitus without complication (HCC)    Disorder of phosphorus metabolism, unspecified 08/31/2020   DM (diabetes mellitus), type 2 with renal complications (HCC) 09/07/2017   Dyspnea    Encounter for screening for respiratory tuberculosis  08/30/2020   End stage renal disease (HCC) 09/07/2017   ESRD (end stage renal disease) (HCC) 08/27/2020   Essential (primary) hypertension 09/07/2017   Fever    History of endocarditis 01/09/2020   Hypercalcemia 10/08/2020   Hyperlipidemia, unspecified 09/07/2017   Hypertension    Hypokalemia 11/13/2021   Iron deficiency anemia, unspecified 08/30/2020   Low back pain 09/07/2017   MI (myocardial infarction) (HCC)    MSSA bacteremia 09/10/2017   Non-ST elevation (NSTEMI) myocardial infarction (HCC) 01/09/2020   NSTEMI (non-ST elevated myocardial infarction) (HCC) 03/24/2019   Other specified arthritis, other site 08/31/2020   Pain, unspecified 08/30/2020   Personal history of other diseases of the circulatory system 08/31/2020   Presence of aortocoronary bypass graft 08/31/2020   Pruritus, unspecified 08/30/2020   Renal disorder    Restless legs syndrome 09/06/2020   S/P CABG x 4 04/03/2019   S/P Video Bronchoscopy, Right VATS with drainage of pleural effusion, decortication, intercostal nerve block 02/20/2020   Secondary hyperparathyroidism of renal origin (HCC) 08/30/2020   Thoracic aortic aneurysm (TAA) (HCC)    4.3 cm ascending TAA 02/02/20 CT   Type 2 diabetes mellitus with diabetic nephropathy (HCC) 08/30/2020    Current Outpatient Medications  Medication Sig Dispense Refill   acetaminophen (TYLENOL) 325 MG tablet Take 650 mg by mouth every 6 (six) hours as needed for moderate pain.     aspirin EC 81 MG tablet Take 81 mg by  mouth daily.     atorvastatin (LIPITOR) 40 MG tablet Take 40 mg by mouth at bedtime.     b complex-vitamin c-folic acid (NEPHRO-VITE) 0.8 MG TABS tablet Take 1 tablet by mouth at bedtime.     calcitRIOL (ROCALTROL) 0.5 MCG capsule Take 2 capsules (1 mcg total) by mouth 3 (three) times a week. 24 capsule 2   clobetasol (TEMOVATE) 0.05 % external solution Apply 1 Application topically 2 (two) times daily.     Dulaglutide 1.5 MG/0.5ML SOPN Inject 1.5 mg into the skin every  Sunday. Trulicity     fluticasone (FLONASE) 50 MCG/ACT nasal spray Place 1 spray into both nostrils daily as needed for allergies or rhinitis.     gabapentin (NEURONTIN) 100 MG capsule Take 2 capsules (200 mg total) by mouth at bedtime.     glipiZIDE (GLUCOTROL XL) 2.5 MG 24 hr tablet Take 2.5 mg by mouth 2 (two) times daily.     ibuprofen (ADVIL) 200 MG tablet Take 200 mg by mouth daily as needed for moderate pain.     ketoconazole (NIZORAL) 2 % shampoo Apply 1 Application topically 3 (three) times a week.     linagliptin (TRADJENTA) 5 MG TABS tablet Take 5 mg by mouth daily.     midodrine (PROAMATINE) 10 MG tablet 20 mg 3 Times a day on Tuesday, Thursday and Saturday 45 tablet 3   mupirocin ointment (BACTROBAN) 2 % Apply 1 Application topically 3 (three) times daily.     Nutritional Supplements (,FEEDING SUPPLEMENT, PROSOURCE PLUS) liquid Take 30 mLs by mouth 2 (two) times daily between meals.     OVER THE COUNTER MEDICATION Place 1 drop into both eyes 2 (two) times daily as needed (dry eyes). Hyper Tears eye drops/Unknown strength     pantoprazole (PROTONIX) 40 MG tablet TAKE 1 TABLET BY MOUTH EVERY DAY 90 tablet 1   RENVELA 800 MG tablet Take 1,600 mg by mouth 3 (three) times daily with meals.     rOPINIRole (REQUIP) 0.5 MG tablet Take 0.5 mg by mouth See admin instructions. Take 0.5 at bedtime, take 0.5 mg before dialysis (Tues, Thurs, and Sat)     SENSIPAR 30 MG tablet Take 30 mg by mouth daily with supper.     tamsulosin (FLOMAX) 0.4 MG CAPS capsule Take 0.4 mg by mouth daily.     torsemide (DEMADEX) 100 MG tablet Take 100 mg by mouth daily.     zolpidem (AMBIEN) 10 MG tablet Take 10 mg by mouth at bedtime.     Current Facility-Administered Medications  Medication Dose Route Frequency Provider Last Rate Last Admin   regadenoson (LEXISCAN) injection SOLN 0.4 mg  0.4 mg Intravenous Once Revankar, Aundra Dubin, MD       technetium tetrofosmin (TC-MYOVIEW) injection 10.9 millicurie  10.9  millicurie Intravenous Once PRN Revankar, Aundra Dubin, MD       technetium tetrofosmin (TC-MYOVIEW) injection 31.4 millicurie  31.4 millicurie Intravenous Once PRN Revankar, Aundra Dubin, MD        Physical Exam BP 101/66   Pulse 82   Resp 20   Ht 6\' 2"  (1.88 m)   Wt 195 lb (88.5 kg)   SpO2 92% Comment: RA  BMI 25.47 kg/m  72 year old man appears older than stated age, in no acute distress Alert and oriented x 3 with no focal neurologic deficits Lungs clear bilaterally Regular rate and rhythm 2/6 holosystolic murmur 2+ edema on left and 1+ on right  Diagnostic Tests: CT CHEST WITHOUT CONTRAST  TECHNIQUE: Multidetector CT imaging of the chest was performed following the standard protocol without IV contrast.   RADIATION DOSE REDUCTION: This exam was performed according to the departmental dose-optimization program which includes automated exposure control, adjustment of the mA and/or kV according to patient size and/or use of iterative reconstruction technique.   COMPARISON:  05/16/2022   FINDINGS: Cardiovascular:   -The ascending thoracic aorta measures 4.2 cm, coronal image 63/3. This appears unchanged when compared with the exam from 05/16/2022 (when using the same technique).   -Focal aneurysmal dilatation along the transverse aortic arch which measures 4.4 cm, image 110/4. Formally this measured 4.3 cm.   -Proximal descending thoracic aorta measures 3.7 cm, image 108/4. Unchanged from previous exam.   -At the level of the hiatus the descending thoracic aorta measures 3.3 cm, image 95/4. Previously 3.3 cm.   Aortic atherosclerosis. Status post CABG. No pericardial effusion. Heart size mildly enlarged.   Mediastinum/Nodes: No enlarged mediastinal or axillary lymph nodes. Thyroid gland, trachea, and esophagus demonstrate no significant findings.   Lungs/Pleura: No pleural effusion. Similar appearance of pleural thickening and parenchymal scarring within the left lung  with secondary volume loss. Mosaic attenuation is identified within both lungs. No airspace consolidation identified. No suspicious pulmonary nodule or mass.   Upper Abdomen: No acute findings. Aortic atherosclerosis. Bilateral renal atrophy.   Musculoskeletal: No acute or suspicious osseous findings. Unchanged lucent bone lesion involving the right humeral head measuring 2.7 cm, image 1/2. This is only partially visualized on today's exam.   IMPRESSION: 1. Stable appearance of ascending thoracic aortic aneurysm measuring 4.2 cm. Focal aneurysmal dilatation along the transverse aortic arch which measures 4.4 cm. Formally this measured 4.3 cm. Recommend semi-annual imaging followup by CTA or MRA and referral to cardiothoracic surgery if not already obtained. This recommendation follows 2010 ACCF/AHA/AATS/ACR/ASA/SCA/SCAI/SIR/STS/SVM Guidelines for the Diagnosis and Management of Patients With Thoracic Aortic Disease. Circulation. 2010; 121: Z610-R60. Aortic aneurysm NOS (ICD10-I71.9) 2. Mosaic attenuation is identified within both lungs which may reflect small airways disease. 3. Stable appearance of pleural thickening and parenchymal scarring within the left lung with secondary volume loss. 4. Unchanged lucent bone lesion involving the right humeral head measuring 2.7 cm. This is only partially visualized on today's exam. This is favored to represent a benign abnormality. 5. Aortic Atherosclerosis (ICD10-I70.0).     Electronically Signed   By: Signa Kell M.D.   On: 02/03/2023 09:53 I personally reviewed the CT images.  4.2 cm ascending aneurysm 4.4 cm arch/descending aneurysm.  Extensive thoracic aortic, coronary and bypass graft atherosclerosis.  Impression: John [name] is a 72 year old man with a history of a non-ST elevation MI, coronary bypass grafting, biventricular failure, end-stage renal disease, hemodialysis, hypertension, hyperlipidemia, thoracic aortic  aneurysms, thoracic aortic atherosclerosis, type 2 diabetes, back pain, arthritis, and pleural effusion requiring VATS for decortication.  Noted to have a thoracic aortic aneurysm on a CT about 6 months ago.  Had a follow-up CT in March but only now is able to come to the office.  He has severe thoracic aortic atherosclerosis also significant coronary atherosclerosis as well as atherosclerosis in his grafts by CT.  Ascending aneurysm only measures about 4.2 cm.  Has a 4.4 cm arch aneurysm as well as descending aneurysm in the 4 cm range.  Unfortunately due to his severe comorbidities, he is not a surgical candidate for elective or emergent surgery for his thoracic aorta.  Plan: Return in 1 year with CT chest no contrast to follow-up aneurysms.  Melrose Nakayama, MD [facility] [phone]

## 2023-05-31 ENCOUNTER — Encounter (HOSPITAL_COMMUNITY): Payer: Self-pay

## 2023-05-31 ENCOUNTER — Ambulatory Visit (HOSPITAL_COMMUNITY)
Admission: RE | Admit: 2023-05-31 | Discharge: 2023-05-31 | Disposition: A | Discharge status: Home / Self Care | Payer: Medicare HMO | Source: Ambulatory Visit | Attending: Physician Assistant | Admitting: Physician Assistant

## 2023-05-31 VITALS — BP 110/70 | HR 98 | Wt 192.4 lb

## 2023-05-31 DIAGNOSIS — I340 Nonrheumatic mitral (valve) insufficiency: Secondary | ICD-10-CM | POA: Diagnosis not present

## 2023-05-31 DIAGNOSIS — R0602 Shortness of breath: Secondary | ICD-10-CM | POA: Diagnosis not present

## 2023-05-31 DIAGNOSIS — R053 Chronic cough: Secondary | ICD-10-CM | POA: Diagnosis not present

## 2023-05-31 DIAGNOSIS — I132 Hypertensive heart and chronic kidney disease with heart failure and with stage 5 chronic kidney disease, or end stage renal disease: Secondary | ICD-10-CM | POA: Insufficient documentation

## 2023-05-31 DIAGNOSIS — R0902 Hypoxemia: Secondary | ICD-10-CM

## 2023-05-31 DIAGNOSIS — Z79899 Other long term (current) drug therapy: Secondary | ICD-10-CM | POA: Diagnosis not present

## 2023-05-31 DIAGNOSIS — Z66 Do not resuscitate: Secondary | ICD-10-CM | POA: Diagnosis not present

## 2023-05-31 DIAGNOSIS — N186 End stage renal disease: Secondary | ICD-10-CM | POA: Diagnosis not present

## 2023-05-31 DIAGNOSIS — I5022 Chronic systolic (congestive) heart failure: Secondary | ICD-10-CM | POA: Insufficient documentation

## 2023-05-31 DIAGNOSIS — I953 Hypotension of hemodialysis: Secondary | ICD-10-CM | POA: Insufficient documentation

## 2023-05-31 DIAGNOSIS — Z951 Presence of aortocoronary bypass graft: Secondary | ICD-10-CM | POA: Insufficient documentation

## 2023-05-31 DIAGNOSIS — Z87891 Personal history of nicotine dependence: Secondary | ICD-10-CM | POA: Diagnosis not present

## 2023-05-31 DIAGNOSIS — E1122 Type 2 diabetes mellitus with diabetic chronic kidney disease: Secondary | ICD-10-CM | POA: Diagnosis not present

## 2023-05-31 DIAGNOSIS — I2510 Atherosclerotic heart disease of native coronary artery without angina pectoris: Secondary | ICD-10-CM | POA: Insufficient documentation

## 2023-05-31 NOTE — Progress Notes (Signed)
Referral for Palliative Care faxed to [facility].  Order for home O2 faxed to Lenis Noon, RN, BSN, [facility] Specialty Coordinator [facility]

## 2023-05-31 NOTE — Progress Notes (Signed)
SATURATION QUALIFICATIONS: (This note is used to comply with regulatory documentation for home oxygen)  Patient Saturations on Room Air at Rest = 93%  Patient Saturations on Room Air while Ambulating = 87%  Patient Saturations on 2 Liters of oxygen while Ambulating = 94%  Please briefly explain why patient needs home oxygen: hypoxia and heart failure

## 2023-05-31 NOTE — Progress Notes (Signed)
ReDS Vest / Clip - 05/31/23 0900       ReDS Vest / Clip   Station Marker C    Ruler Value 32    ReDS Value Range High volume overload    ReDS Actual Value 43

## 2023-05-31 NOTE — Progress Notes (Addendum)
ADVANCED HF CLINIC NOTE   Primary Care: Erskine Emery, NP Primary Cardiologist: Gypsy Balsam, MD HF Cardiologist: Dr. Gala Romney   HPI:  John [name] is a 72 y.o.. male history of coronary artery disease with prior PCI to the RCA, DMII, HTN, ascending aortic aneurysm followed by TCTS, PAD s/p aortofemoral bypass and ESRD on HD,     Presented 2020 w/ CP and elevated Hstop: Cath revealed severe multivessel disease. Underwent a four-vessel CABG 5/20 with LIMA to LAD, SVG to diagonal, SVG to OM, and SVG to PDA.    Found to have chronic pleural effusion 3/21. Had video bronchoscopy done as well as left VATS with drainage of empyema as well as decortication and intercostal nerve block.    Had progressively worsening renal failure until October 21' when he started HD.  10/22 had echo d/t DOE. EF was 35 to 40%.  Stress test has did not show any significant ischemia. GDMT started but difficulty managing 2/2 hypotension. Echo 9/23 EF down to 20 to 25%, mild LVH. RV size is normal, severely elevated pulmonary artery systolic pressure, mild MR, mod TR. Referred to AHF clinic for further management with worsening HFrEF.   PYP 11/23 negative   Dr. Gala Romney first saw him 9/23, and he was markedly volume overloaded. Dr. Gala Romney d/w Dr. Glenna Fellows and the plan was to pull more fluid with HD and decrease his dry weight. They were able to get him down from 208 to 195 lbs.  Follow up 1/24,  REDs 41%, volume remained up but improving. NYHA IIIb.  Echo 01/04/23 EF 20-25% grade II DD, RV moderately down, severe MR, moderate TR  Last seen for follow-up 02/24. Blood pressure dropping with HD requiring midodrine. Still very short of breath despite better volume control with HD.   TEE 02/09/23: EF 20%, RV severely HK, mod BAE, 2-3+ central MR, moderate TR  RHC 02/09/23: Markedly elevated filling pressures with normal output (in setting of AVF), prominent v waves in PCWP tracing suggesting  significant MR, tortuous vessles only able to cannulate LIMA to LAD and SVG to RCA both of which were patent.   He was admitted 02/19/23 with viral PNA (respiratory panel positive for coronavirus NL 63) and acute on chronic CHF. He did receive course of abx. Volume was managed with iHD.  He seen for urgent follow-up 06/21. Reported worsening dyspnea and decline in activity tolerance over the last year. ReDS up to 57%. NYHA IIIb. He was volume overloaded and had trouble tolerating HD d/t hypotension. Discussed lowering dry weight during HD with Nephrology and midodrine increased to 20 mg TID. He was referred to Palliative Care to discuss GOC.  He is here today for f/u. He is accompanied by his wife. His weight is down 4 lb since last visit. He is able to tolerate a little more volume removal but still has occasional issues with hypotension requiring HD be stopped early. He continues to get winded with light activity. Patient is frustrated he cannot tolerate yard work or other activities he previously enjoyed. + orthopnea, no PND. Leg edema improved.    Cardiac studies: - TEE 02/09/23: EF 20%, RV severely HK, mod BAE, moderate TR  - [facility] 02/09/23: Markedly elevated filling pressures with normal output (in setting of AVF), prominent v waves in PCWP tracing suggesting significant MR, tortuous vessles only able to cannulate LIMA to LAD and SVG to RCA both of which were patent.  - Echo 01/04/23 EF 20-25% grade II DD, RV  moderately down, severe MR, moderate TR  - Echo 9/23 EF down to 20 to 25%. mild LVH. RV size is normal. Severely elevated pulmonary artery systolic pressure. LA mildly elevated, RA mod dilated. Mild MR, Mod TR.   - Echo 3/23 EF 30-35%  - Echo 10/22 EF 35-40%  - Lexiscan 2/22 normal  - Echo 8/21, TEE 2/21, Echo 9/21 EF 40-45%  - Echo 5/20 EF 50-55%  - LHC 2020: severe multivessel disease  - Echo 10/18 EF 55-60%    Past Medical History:  Diagnosis Date   Allergy,  unspecified, initial encounter 08/30/2020   Anaphylactic shock, unspecified, initial encounter 08/30/2020   Anemia in chronic kidney disease 08/30/2020   Arthritis    Back pain 09/07/2017   Carotid artery stenosis    40-59% LICA, 1-39% RICA 03/27/19 [place]   Chest pain 03/24/2019   Chronic infective endocarditis    Chronic kidney disease (CKD), stage IV (severe) (HCC) 12/01/2018   CKD (chronic kidney disease), stage V (HCC) 03/24/2019   Coronary artery disease    Dependence on renal dialysis (HCC) 08/31/2020   Diabetes mellitus without complication (HCC)    Disorder of phosphorus metabolism, unspecified 08/31/2020   DM (diabetes mellitus), type 2 with renal complications (HCC) 09/07/2017   Dyspnea    Encounter for screening for respiratory tuberculosis 08/30/2020   End stage renal disease (HCC) 09/07/2017   ESRD (end stage renal disease) (HCC) 08/27/2020   Essential (primary) hypertension 09/07/2017   Fever    History of endocarditis 01/09/2020   Hypercalcemia 10/08/2020   Hyperlipidemia, unspecified 09/07/2017   Hypertension    Hypokalemia 11/13/2021   Iron deficiency anemia, unspecified 08/30/2020   Low back pain 09/07/2017   MI (myocardial infarction) (HCC)    MSSA bacteremia 09/10/2017   Non-ST elevation (NSTEMI) myocardial infarction (HCC) 01/09/2020   NSTEMI (non-ST elevated myocardial infarction) (HCC) 03/24/2019   Other specified arthritis, other site 08/31/2020   Pain, unspecified 08/30/2020   Personal history of other diseases of the circulatory system 08/31/2020   Presence of aortocoronary bypass graft 08/31/2020   Pruritus, unspecified 08/30/2020   Renal disorder    Restless legs syndrome 09/06/2020   S/P CABG x 4 04/03/2019   S/P Video Bronchoscopy, Right VATS with drainage of pleural effusion, decortication, intercostal nerve block 02/20/2020   Secondary hyperparathyroidism of renal origin (HCC) 08/30/2020   Thoracic aortic aneurysm (TAA) (HCC)    4.3 cm ascending TAA 02/02/20 CT   Type 2  diabetes mellitus with diabetic nephropathy (HCC) 08/30/2020   Current Outpatient Medications  Medication Sig Dispense Refill   acetaminophen (TYLENOL) 325 MG tablet Take 650 mg by mouth every 6 (six) hours as needed for moderate pain.     aspirin EC 81 MG tablet Take 81 mg by mouth daily.     atorvastatin (LIPITOR) 40 MG tablet Take 40 mg by mouth at bedtime.     b complex-vitamin c-folic acid (NEPHRO-VITE) 0.8 MG TABS tablet Take 1 tablet by mouth at bedtime.     clobetasol (TEMOVATE) 0.05 % external solution Apply 1 Application topically 2 (two) times daily.     Dulaglutide 1.5 MG/0.5ML SOPN Inject 1.5 mg into the skin every Sunday. Trulicity     fluticasone (FLONASE) 50 MCG/ACT nasal spray Place 1 spray into both nostrils daily as needed for allergies or rhinitis.     gabapentin (NEURONTIN) 100 MG capsule Take 2 capsules (200 mg total) by mouth at bedtime.     glipiZIDE (GLUCOTROL XL) 2.5 MG 24  hr tablet Take 2.5 mg by mouth 2 (two) times daily.     ibuprofen (ADVIL) 200 MG tablet Take 200 mg by mouth daily as needed for moderate pain.     ketoconazole (NIZORAL) 2 % shampoo Apply 1 Application topically 3 (three) times a week.     linagliptin (TRADJENTA) 5 MG TABS tablet Take 5 mg by mouth daily.     midodrine (PROAMATINE) 10 MG tablet 20 mg 3 Times a day on Tuesday, Thursday and Saturday 45 tablet 3   mupirocin ointment (BACTROBAN) 2 % Apply 1 Application topically 3 (three) times daily.     Nutritional Supplements (,FEEDING SUPPLEMENT, PROSOURCE PLUS) liquid Take 30 mLs by mouth 2 (two) times daily between meals.     OVER THE COUNTER MEDICATION Place 1 drop into both eyes 2 (two) times daily as needed (dry eyes). Hyper Tears eye drops/Unknown strength     pantoprazole (PROTONIX) 40 MG tablet TAKE 1 TABLET BY MOUTH EVERY DAY 90 tablet 1   RENVELA 800 MG tablet Take 1,600 mg by mouth 3 (three) times daily with meals.     rOPINIRole (REQUIP) 0.5 MG tablet Take 0.5 mg by mouth See admin  instructions. Take 0.5 at bedtime, take 0.5 mg before dialysis (Tues, Thurs, and Sat)     SENSIPAR 30 MG tablet Take 30 mg by mouth daily with supper.     tamsulosin (FLOMAX) 0.4 MG CAPS capsule Take 0.4 mg by mouth daily.     torsemide (DEMADEX) 100 MG tablet Take 100 mg by mouth daily.     zolpidem (AMBIEN) 10 MG tablet Take 10 mg by mouth at bedtime.     Current Facility-Administered Medications  Medication Dose Route Frequency Provider Last Rate Last Admin   regadenoson (LEXISCAN) injection SOLN 0.4 mg  0.4 mg Intravenous Once Revankar, Aundra Dubin, MD       technetium tetrofosmin (TC-MYOVIEW) injection 10.9 millicurie  10.9 millicurie Intravenous Once PRN Revankar, Aundra Dubin, MD       technetium tetrofosmin (TC-MYOVIEW) injection 31.4 millicurie  31.4 millicurie Intravenous Once PRN Revankar, Aundra Dubin, MD       Allergies  Allergen Reactions   Penicillins Rash and Other (See Comments)    Did it involve swelling of the face/tongue/throat, SOB, or low BP? No Did it involve sudden or severe rash/hives, skin peeling, or any reaction on the inside of your mouth or nose? Yes Did you need to seek medical attention at a hospital or doctor's office? Yes When did it last happen?      Childhood allergy If all above answers are "NO", may proceed with cephalosporin use.    Social History   Socioeconomic History   Marital status: Married    Spouse name: Not on file   Number of children: Not on file   Years of education: Not on file   Highest education level: Not on file  Occupational History   Not on file  Tobacco Use   Smoking status: Former    Packs/day: 2.00    Years: 30.00    Additional pack years: 0.00    Total pack years: 60.00    Types: Cigarettes    Start date: 76    Quit date: 1998    Years since quitting: 26.5   Smokeless tobacco: Never  Vaping Use   Vaping Use: Never used  Substance and Sexual Activity   Alcohol use: Yes    Comment: Occasionally.   Drug use: Never    Sexual activity:  Not on file  Other Topics Concern   Not on file  Social History Narrative   Not on file   Social Determinants of Health   Financial Resource Strain: Not on file  Food Insecurity: Not on file  Transportation Needs: Not on file  Physical Activity: Not on file  Stress: Not on file  Social Connections: Not on file  Intimate Partner Violence: Not on file   Family History  Problem Relation Age of Onset   Cancer Maternal Grandmother    Brain cancer Maternal Grandmother    Colon cancer Neg Hx    Rectal cancer Neg Hx    Stomach cancer Neg Hx    BP 110/70   Pulse 98   Wt 87.3 kg (192 lb 6.4 oz)   SpO2 96%   BMI 24.70 kg/m   Wt Readings from Last 3 Encounters:  05/31/23 87.3 kg (192 lb 6.4 oz)  05/25/23 88.5 kg (195 lb)  05/14/23 89.1 kg (196 lb 6.4 oz)   PHYSICAL EXAM: General:  Chronically ill appearing. HEENT: normal Neck: supple. JVP 8-10. Carotids 2+ bilat; no bruits. Cor: PMI nondisplaced. Regular rate & rhythm. No rubs, gallops, 2/6 MR murmur Lungs: clear Abdomen: soft, nontender, + distended.  Extremities: no cyanosis, clubbing, rash, 1-2+ edema Neuro: alert & orientedx3, cranial nerves grossly intact. Affect pleasant    REDs: 50%-->41%-->34% -->57%-->43%  ASSESSMENT & PLAN: Chronic systolic heart failure - Echo 81/19 EF 55-60% - CABG x4 2020 - Echo 8/21, TEE 2/21, Echo 9/21 EF 40-45% - Echo 10/22 EF 35-40% - EF 3/23 EF 30-35% - Echo 9/23 EF 20 to 25%. mild LVH. RV size is normal. Severely elevated pulmonary artery systolic pressure. Mild MR, Mod TR.  - Echo today 01/04/23 showed EF EF 20-25% grade II DD, RV moderately down, RVSP 70 mmHg, severe MR, moderate TR - PYP negative - TEE 02/09/23: EF 20%, RV severely HK, mod BAE, 2-3+ central MR, moderate TR - R/LHC 02/09/23: Markedly elevated filling pressures with normal output (in setting of AVF), prominent v waves in PCWP tracing suggesting significant MR, tortuous vessles only able to cannulate  LIMA to LAD and SVG to RCA both of which were patent. - NYHA IIIB. Dr. Gala Romney reviewed TEE and [facility] with structural team. MR not severe enough for mitraclip and likely wound not improve his symptoms at this point. Suspect symptoms due to progressive heart failure and residual volume overload. - Takes torsemide but likely not making enough urine that increasing diuretic would make much difference in volume status. His dry weight was recently reduced by Nephrology. He is down 4 lb from last visit and volume better on exam. However, still having some occasional issues tolerating HD d/t hypotension. Continue midodrine 20 mg TID - Unable to start GDMT w/ ESRD requiring midodrine for BP support. - Not candidate for VAD/transplant with age and ESRD. He appears end-stage. He understands that we would need to transition to hospice care if he gets to the point that he is no longer able to tolerate HD.   2. MR - Severe on TTE in 02/24 - Moderate in severity on TEE 03/34 - RHC 03/24 with prominent v waves in PCWP tracing suggesting significant MR - Not severe enough for MitraClip as above.   3. CAD, ICM s/p CABG x4 - Previous smoker, quit in 28' - s/p CABG x4 2020 - LIMA to LAD and SVG to RCA patent on LHC in 03/24. Unable to cannulate other vessels - Continue ASA and statin  4. ESRD on HD - T/Th/Sat schedule.  5. HTN - resolved. Now requiring midodrine - Has been getting hypotensive with HD.  6. DM II - Managed by PCP. - Hgb A1c last 6.3   7. SOB/Persistent cough - Follows w/ Dr. Delton Coombes. - Suspect cough 2/2 volume overload. - O2 sats 96% at rest, desats to 87% with activity. Will arrange for home O2.   GOC: Had been referred to Palliative Care but [facility] only does virtual visits in [place]. Prefers face to face encounters. Will refer to a local agency in [facility]. - He is clear that he would not want CPR or mechanical ventilation. Code status changed to DNR/DNI. Gold  form completed. - Met with HF CSW to discuss completing Advance Directive.  Follow-up:6-8 weeks  Clennon Nasca N, PA-C  8:28 AM

## 2023-05-31 NOTE — Patient Instructions (Signed)
Medication Changes:  None, Continue current medications  Lab Work:  none  Testing/Procedures:  none  Referrals:  You have been referred to [facility] for Palliative Care, they will call you to schedule a visit  You have been referred to [facility] for home oxygen, they will call you to schedule delivery  Special Instructions // Education:  Do the following things EVERYDAY: Weigh yourself in the morning before breakfast. Write it down and keep it in a log. Take your medicines as prescribed Eat low salt foods--Limit salt (sodium) to 2000 mg per day.  Stay as active as you can everyday Limit all fluids for the day to less than 2 liters  We have provided you with a Gold DNR form, please hang this on your refrigerator and take with you to any appointments or hospitalizations   Follow-Up in: 6 weeks (Mon 07/12/23 at 10 am)  At the [facility], you and your health needs are our priority. We have a designated team specialized in the treatment of Heart Failure. This Care Team includes your primary Heart Failure Specialized Cardiologist (physician), Advanced Practice Providers (APPs- Physician Assistants and Nurse Practitioners), and Pharmacist who all work together to provide you with the care you need, when you need it.   You may see any of the following providers on your designated Care Team at your next follow up:  Dr. Arvilla Meres Dr. Marca Ancona Dr. Marcos Eke, NP Robbie Lis, [place] [facility] [place], [place] Brynda Peon, NP Karle Plumber, PharmD   Please be sure to bring in all your medications bottles to every appointment.   Need to Contact us:  If you have any questions or concerns before your next appointment please send [place] a message through [place] or call our office at [phone].    TO LEAVE A MESSAGE FOR THE NURSE SELECT OPTION 2, PLEASE LEAVE A MESSAGE INCLUDING: YOUR NAME DATE OF BIRTH CALL BACK  NUMBER REASON FOR CALL**this is important as we prioritize the call backs  YOU WILL RECEIVE A CALL BACK THE SAME DAY AS LONG AS YOU CALL BEFORE 4:00 PM

## 2023-05-31 NOTE — Addendum Note (Signed)
Encounter addended by: Noralee Space, RN on: 05/31/2023 2:48 PM  Actions taken: Order list changed, Diagnosis association updated, Clinical Note Signed

## 2023-05-31 NOTE — Addendum Note (Signed)
Encounter addended by: Marcy Siren, LCSW on: 05/31/2023 1:27 PM  Actions taken: Clinical Note Signed

## 2023-05-31 NOTE — Progress Notes (Signed)
CSW met with patient and wife to discuss Advanced Directives. Patient shared that he and his wife have had conversations about his wishes and that he would like to get it down on paper. Patient's wife became tearful during the conversation but acknowledges patient's feelings and wishes. CSW reviewed the Advanced Directive packet with patient and discussed at length his wishes. Patient will complete and have notarized at his bank. CSW encouraged patient to bring back a copy to his next appointment to have scanned into the EMR. CSW continues to follow and be available as needed. Lasandra Beech, LCSW, CCSW-MCS [phone]

## 2023-06-27 DIAGNOSIS — I361 Nonrheumatic tricuspid (valve) insufficiency: Secondary | ICD-10-CM | POA: Diagnosis not present

## 2023-06-27 DIAGNOSIS — I350 Nonrheumatic aortic (valve) stenosis: Secondary | ICD-10-CM | POA: Diagnosis not present

## 2023-06-28 DIAGNOSIS — E785 Hyperlipidemia, unspecified: Secondary | ICD-10-CM

## 2023-06-28 DIAGNOSIS — J90 Pleural effusion, not elsewhere classified: Secondary | ICD-10-CM

## 2023-06-28 DIAGNOSIS — J129 Viral pneumonia, unspecified: Secondary | ICD-10-CM | POA: Diagnosis not present

## 2023-06-28 DIAGNOSIS — I2510 Atherosclerotic heart disease of native coronary artery without angina pectoris: Secondary | ICD-10-CM | POA: Diagnosis not present

## 2023-06-28 DIAGNOSIS — I5042 Chronic combined systolic (congestive) and diastolic (congestive) heart failure: Secondary | ICD-10-CM | POA: Diagnosis not present

## 2023-06-28 DIAGNOSIS — N186 End stage renal disease: Secondary | ICD-10-CM | POA: Diagnosis not present

## 2023-06-30 ENCOUNTER — Encounter: Payer: Self-pay | Admitting: Family Medicine

## 2023-07-09 NOTE — Progress Notes (Signed)
ADVANCED HF CLINIC NOTE   Primary Care: Erskine Emery, NP Primary Cardiologist: Gypsy Balsam, MD HF Cardiologist: Dr. Gala Romney   HPI: John [name] is a 72 y.o.. male history of coronary artery disease with prior PCI to the RCA, DMII, HTN, ascending aortic aneurysm followed by TCTS, PAD s/p aortofemoral bypass and ESRD on HD,     Presented 2020 w/ CP and elevated Hstop: Cath revealed severe multivessel disease. Underwent a four-vessel CABG 5/20 with LIMA to LAD, SVG to diagonal, SVG to OM, and SVG to PDA.    Found to have chronic pleural effusion 3/21. Had video bronchoscopy done as well as left VATS with drainage of empyema as well as decortication and intercostal nerve block.    Had progressively worsening renal failure until October 21' when he started HD.  10/22 had echo d/t DOE. EF was 35 to 40%.  Stress test has did not show any significant ischemia. GDMT started but difficulty managing 2/2 hypotension. Echo 9/23 EF down to 20 to 25%, mild LVH. RV size is normal, severely elevated pulmonary artery systolic pressure, mild MR, mod TR. Referred to AHF clinic for further management with worsening HFrEF.   PYP 11/23 negative   Dr. Gala Romney first saw him 9/23, and he was markedly volume overloaded. Dr. Gala Romney d/w Dr. Glenna Fellows and the plan was to pull more fluid with HD and decrease his dry weight. They were able to get him down from 208 to 195 lbs.  Follow up 1/24,  REDs 41%, volume remained up but improving. NYHA IIIb.  Echo 01/04/23 EF 20-25% grade II DD, RV moderately down, severe MR, moderate TR  Last seen for follow-up 02/24. Blood pressure dropping with HD requiring midodrine. Still very short of breath despite better volume control with HD.   TEE 02/09/23: EF 20%, RV severely HK, mod BAE, 2-3+ central MR, moderate TR  RHC 02/09/23: Markedly elevated filling pressures with normal output (in setting of AVF), prominent v waves in PCWP tracing suggesting significant  MR, tortuous vessles only able to cannulate LIMA to LAD and SVG to RCA both of which were patent.   He was admitted 02/19/23 with viral PNA (respiratory panel positive for coronavirus NL 63) and acute on chronic CHF. He did receive course of abx. Volume was managed with iHD.  He seen for urgent follow-up 06/21. Reported worsening dyspnea and decline in activity tolerance over the last year. ReDS up to 57%. NYHA IIIb. He was volume overloaded and had trouble tolerating HD d/t hypotension. Discussed lowering dry weight during HD with Nephrology and midodrine increased to 20 mg TID. He was referred to Palliative Care to discuss GOC.  Follow up 7/24, functional decline. NYHA IIIb, occasional hypotension at HD on 20 mg midodrine. Arranged for home O2, referred to Palliative in [facility]. Patient elected DNR/DNI and Gold form completed.   Ad[facility] to [facility] (8/24) with SOB, found to have rhinovirus. Stayed >1 week. Stopped Trajenta. Given abx. No records to review.  Today he returns for HF follow up with his wife. Overall feeling poorly. Worse over past 2 weeks. Has productive cough and he is SOB walking short distances on flat ground. Legs are weak and swelling. He feels palpitations and dizziness. Denies CP or PND/Orthopnea. Appetite fair, he has early satiety. No fever or chills. Weight at dialysis 82 kg. Taking all medications. Wears oxygen now. He has been seen by Palliative in [facility]. Unable to work in yard or garden anymore, QOL significantly  declined. Has more bad days than good. Wife says she does not want him to give up if there are other options. Has had low BPs at dialysis, on midodrine.  Cardiac studies: - TEE 02/09/23: EF 20%, RV severely HK, mod BAE, moderate TR  - [facility] 02/09/23: Markedly elevated filling pressures with normal output (in setting of AVF), prominent v waves in PCWP tracing suggesting significant MR, tortuous vessles only able to cannulate LIMA to LAD and  SVG to RCA both of which were patent.  - Echo 01/04/23 EF 20-25% grade II DD, RV moderately down, severe MR, moderate TR  - Echo 9/23 EF down to 20 to 25%. mild LVH. RV size is normal. Severely elevated pulmonary artery systolic pressure. LA mildly elevated, RA mod dilated. Mild MR, Mod TR.   - Echo 3/23 EF 30-35%  - Echo 10/22 EF 35-40%  - Lexiscan 2/22 normal  - Echo 8/21, TEE 2/21, Echo 9/21 EF 40-45%  - Echo 5/20 EF 50-55%  - LHC 2020: severe multivessel disease  - Echo 10/18 EF 55-60%    Past Medical History:  Diagnosis Date   Allergy, unspecified, initial encounter 08/30/2020   Anaphylactic shock, unspecified, initial encounter 08/30/2020   Anemia in chronic kidney disease 08/30/2020   Arthritis    Back pain 09/07/2017   Carotid artery stenosis    40-59% LICA, 1-39% RICA 03/27/19 [place]   Chest pain 03/24/2019   Chronic infective endocarditis    Chronic kidney disease (CKD), stage IV (severe) (HCC) 12/01/2018   CKD (chronic kidney disease), stage V (HCC) 03/24/2019   Coronary artery disease    Dependence on renal dialysis (HCC) 08/31/2020   Diabetes mellitus without complication (HCC)    Disorder of phosphorus metabolism, unspecified 08/31/2020   DM (diabetes mellitus), type 2 with renal complications (HCC) 09/07/2017   Dyspnea    Encounter for screening for respiratory tuberculosis 08/30/2020   End stage renal disease (HCC) 09/07/2017   ESRD (end stage renal disease) (HCC) 08/27/2020   Essential (primary) hypertension 09/07/2017   Fever    History of endocarditis 01/09/2020   Hypercalcemia 10/08/2020   Hyperlipidemia, unspecified 09/07/2017   Hypertension    Hypokalemia 11/13/2021   Iron deficiency anemia, unspecified 08/30/2020   Low back pain 09/07/2017   MI (myocardial infarction) (HCC)    MSSA bacteremia 09/10/2017   Non-ST elevation (NSTEMI) myocardial infarction (HCC) 01/09/2020   NSTEMI (non-ST elevated myocardial infarction) (HCC) 03/24/2019   Other specified arthritis,  other site 08/31/2020   Pain, unspecified 08/30/2020   Personal history of other diseases of the circulatory system 08/31/2020   Presence of aortocoronary bypass graft 08/31/2020   Pruritus, unspecified 08/30/2020   Renal disorder    Restless legs syndrome 09/06/2020   S/P CABG x 4 04/03/2019   S/P Video Bronchoscopy, Right VATS with drainage of pleural effusion, decortication, intercostal nerve block 02/20/2020   Secondary hyperparathyroidism of renal origin (HCC) 08/30/2020   Thoracic aortic aneurysm (TAA) (HCC)    4.3 cm ascending TAA 02/02/20 CT   Type 2 diabetes mellitus with diabetic nephropathy (HCC) 08/30/2020   Current Outpatient Medications  Medication Sig Dispense Refill   acetaminophen (TYLENOL) 325 MG tablet Take 650 mg by mouth every 6 (six) hours as needed for moderate pain.     aspirin EC 81 MG tablet Take 81 mg by mouth daily.     atorvastatin (LIPITOR) 40 MG tablet Take 40 mg by mouth at bedtime.     b complex-vitamin c-folic acid (NEPHRO-VITE) 0.8  MG TABS tablet Take 1 tablet by mouth at bedtime.     clobetasol (TEMOVATE) 0.05 % external solution Apply 1 Application topically 2 (two) times daily.     fluticasone (FLONASE) 50 MCG/ACT nasal spray Place 1 spray into both nostrils daily as needed for allergies or rhinitis.     gabapentin (NEURONTIN) 100 MG capsule Take 2 capsules (200 mg total) by mouth at bedtime.     ibuprofen (ADVIL) 200 MG tablet Take 200 mg by mouth daily as needed for moderate pain.     ketoconazole (NIZORAL) 2 % shampoo Apply 1 Application topically 3 (three) times a week.     midodrine (PROAMATINE) 10 MG tablet 20 mg 3 Times a day on Tuesday, Thursday and Saturday 45 tablet 3   mupirocin ointment (BACTROBAN) 2 % Apply 1 Application topically 3 (three) times daily.     Nutritional Supplements (,FEEDING SUPPLEMENT, PROSOURCE PLUS) liquid Take 30 mLs by mouth 2 (two) times daily between meals.     OVER THE COUNTER MEDICATION Place 1 drop into both eyes 2 (two)  times daily as needed (dry eyes). Hyper Tears eye drops/Unknown strength     pantoprazole (PROTONIX) 40 MG tablet TAKE 1 TABLET BY MOUTH EVERY DAY 90 tablet 1   RENVELA 800 MG tablet Take 1,600 mg by mouth 3 (three) times daily with meals.     rOPINIRole (REQUIP) 0.5 MG tablet Take 0.5 mg by mouth See admin instructions. Take 0.5 at bedtime, take 0.5 mg before dialysis (Tues, Thurs, and Sat)     SENSIPAR 30 MG tablet Take 30 mg by mouth daily with supper.     tamsulosin (FLOMAX) 0.4 MG CAPS capsule Take 0.4 mg by mouth daily.     torsemide (DEMADEX) 100 MG tablet Take 100 mg by mouth daily.     zolpidem (AMBIEN) 10 MG tablet Take 10 mg by mouth at bedtime.     Current Facility-Administered Medications  Medication Dose Route Frequency Provider Last Rate Last Admin   regadenoson (LEXISCAN) injection SOLN 0.4 mg  0.4 mg Intravenous Once Revankar, Aundra Dubin, MD       technetium tetrofosmin (TC-MYOVIEW) injection 10.9 millicurie  10.9 millicurie Intravenous Once PRN Revankar, Aundra Dubin, MD       technetium tetrofosmin (TC-MYOVIEW) injection 31.4 millicurie  31.4 millicurie Intravenous Once PRN Revankar, Aundra Dubin, MD       Allergies  Allergen Reactions   Penicillins Rash and Other (See Comments)    Did it involve swelling of the face/tongue/throat, SOB, or low BP? No Did it involve sudden or severe rash/hives, skin peeling, or any reaction on the inside of your mouth or nose? Yes Did you need to seek medical attention at a hospital or doctor's office? Yes When did it last happen?      Childhood allergy If all above answers are "NO", may proceed with cephalosporin use.    Social History   Socioeconomic History   Marital status: Married    Spouse name: Not on file   Number of children: Not on file   Years of education: Not on file   Highest education level: Not on file  Occupational History   Not on file  Tobacco Use   Smoking status: Former    Current packs/day: 0.00    Average packs/day:  2.0 packs/day for 30.0 years (60.0 ttl pk-yrs)    Types: Cigarettes    Start date: 70    Quit date: 68    Years since quitting: 15.6  Smokeless tobacco: Never  Vaping Use   Vaping status: Never Used  Substance and Sexual Activity   Alcohol use: Yes    Comment: Occasionally.   Drug use: Never   Sexual activity: Not on file  Other Topics Concern   Not on file  Social History Narrative   Not on file   Social Determinants of Health   Financial Resource Strain: Not on file  Food Insecurity: Not on file  Transportation Needs: Not on file  Physical Activity: Not on file  Stress: Not on file  Social Connections: Not on file  Intimate Partner Violence: Not on file   Family History  Problem Relation Age of Onset   Cancer Maternal Grandmother    Brain cancer Maternal Grandmother    Colon cancer Neg Hx    Rectal cancer Neg Hx    Stomach cancer Neg Hx    BP 98/62   Pulse 90   Wt 85 kg (187 lb 6.4 oz)   SpO2 98% Comment: Patient on 2-liters of room oxygen.  BMI 24.06 kg/m   Wt Readings from Last 3 Encounters:  07/12/23 85 kg (187 lb 6.4 oz)  05/31/23 87.3 kg (192 lb 6.4 oz)  05/25/23 88.5 kg (195 lb)   PHYSICAL EXAM: General:  NAD. No resp difficulty, walked into clinic on oxygen, chronically-ill appearing HEENT: Normal Neck: Supple. JVP 10 Carotids 2+ bilat; no bruits. No lymphadenopathy or thryomegaly appreciated. Cor: PMI nondisplaced. Regular rate & rhythm. No rubs, gallops, 2/6 MR Lungs: Clear Abdomen: Soft, nontender, +distended. No hepatosplenomegaly. No bruits or masses. Good bowel sounds. Extremities: No cyanosis, clubbing, rash, 1-2+BLE pre-tibial edema Neuro: Alert & oriented x 3, cranial nerves grossly intact. Moves all 4 extremities w/o difficulty. Affect pleasant.  ECG (personally reviewed): SR with 1AVB, +PACs  REDs: 50%-->41%-->34% -->57%-->43%, unable to obtain reading today  ASSESSMENT & PLAN: Chronic systolic heart failure - Echo 13/08 EF  55-60% - CABG x4 2020 - Echo 8/21, TEE 2/21, Echo 9/21 EF 40-45% - Echo 10/22 EF 35-40% - EF 3/23 EF 30-35% - Echo 9/23 EF 20 to 25%. mild LVH. RV size is normal. Severely elevated pulmonary artery systolic pressure. Mild MR, Mod TR.  - Echo 01/04/23 showed EF EF 20-25% grade II DD, RV moderately down, RVSP 70 mmHg, severe MR, moderate TR - PYP negative - TEE 02/09/23: EF 20%, RV severely HK, mod BAE, 2-3+ central MR, moderate TR - R/LHC 02/09/23: Markedly elevated filling pressures with normal output (in setting of AVF), prominent v waves in PCWP tracing suggesting significant MR, tortuous vessles only able to cannulate LIMA to LAD and SVG to RCA both of which were patent. - NYHA IIIb. Dr. Gala Romney reviewed TEE and [facility] with structural team. MR not severe enough for mitraclip and likely wound not improve his symptoms at this point. Suspect symptoms due to progressive heart failure and residual volume overload. - Takes torsemide but likely not making enough urine that increasing diuretic would make much difference in volume status. He is down 5 lb from last visit, however he remains volume overloaded by exam. - Continue midodrine 20 mg tid.  - Unable to start GDMT w/ ESRD requiring midodrine for BP support. - Not candidate for VAD/transplant with age and ESRD. He appears end-stage. (See below)  2. MR - Severe on TTE in 02/24 - Moderate in severity on TEE 03/34 - RHC 3/24 with prominent v waves in PCWP tracing suggesting significant MR - Not severe enough for MitraClip as above.  3. CAD, ICM s/p CABG x4 - Previous smoker, quit in 51' - s/p CABG x4 2020 - LIMA to LAD and SVG to RCA patent on LHC in 03/24. Unable to cannulate other vessels - No hcest pain - Continue ASA and statin  4. ESRD on HD - T/Th/Sat schedule. - Having low BPs at dialysis - On midodrine  5. HTN - resolved. Now requiring midodrine - Has been getting hypotensive with HD.  6. DM II - Managed by PCP. - Hgb A1c  last 6.3   7. SOB/Persistent cough - Follows w/ Dr. Delton Coombes. - Suspect cough 2/2 volume overload. - Now on 2L home o2  8. GOC:  - He is followed by palliative in [facility]. - He is having more bad days than good, "tired of being tired" and unable to do activities he once enjoyed. - Does not feel well after dialysis, but unsure if he is ready to stop. - He is DNR/DNI.  - Long discussion today with him and his wife, we do not have any therapies to offer that will make him better. Discussed quality of life and symptoms management. Recommend transition to Hospice when he and wife are ready  Follow up in 3 months with Dr. Gala Romney  Jacklynn Ganong, FNP  10:14 AM  Patient seen and examined with the above-signed Advanced Practice Provider and/or Housestaff. I personally reviewed laboratory data, imaging studies and relevant notes. I independently examined the patient and formulated the important aspects of the plan. I have edited the note to reflect any of my changes or salient points. I have personally discussed the plan with the patient and/or family.  He has end-stage HF and ESRD. Now followed by Palliative Care.   Reports progressive symptoms of fatigue and dyspnea. HD becoming more difficult.   On high-dose midodrine to support HD  General:  Weak appearing. No resp difficulty HEENT: normal Neck: supple. JVP to jaw. Carotids 2+ bilat; no bruits. No lymphadenopathy or thryomegaly appreciated. Cor: Regular rate & rhythm. 3/6 MR. Lungs: clear Abdomen: soft, nontender, nondistended. No hepatosplenomegaly. No bruits or masses. Good bowel sounds. Extremities: no cyanosis, clubbing, rash, 2+ edema Neuro: alert & orientedx3, cranial nerves grossly intact. moves all 4 extremities w/o difficulty. Affect pleasant  Long talk with him and his wife. He continues to deteriorate and has no other therapeutic options left. Strongly encouraged them to consider switch to Hospice Care.   They will  continue to discuss. He remains DNR.   Total time spent 40 minutes. Over half that time spent discussing above.   Arvilla Meres, MD  11:49 PM

## 2023-07-12 ENCOUNTER — Encounter (HOSPITAL_COMMUNITY): Payer: Self-pay

## 2023-07-12 ENCOUNTER — Ambulatory Visit (HOSPITAL_COMMUNITY)
Admission: RE | Admit: 2023-07-12 | Discharge: 2023-07-12 | Disposition: A | Discharge status: Home / Self Care | Payer: Medicare HMO | Source: Ambulatory Visit | Attending: Internal Medicine | Admitting: Internal Medicine

## 2023-07-12 VITALS — BP 98/62 | HR 90 | Wt 187.4 lb

## 2023-07-12 DIAGNOSIS — Z992 Dependence on renal dialysis: Secondary | ICD-10-CM | POA: Insufficient documentation

## 2023-07-12 DIAGNOSIS — E1122 Type 2 diabetes mellitus with diabetic chronic kidney disease: Secondary | ICD-10-CM | POA: Insufficient documentation

## 2023-07-12 DIAGNOSIS — I5022 Chronic systolic (congestive) heart failure: Secondary | ICD-10-CM | POA: Diagnosis not present

## 2023-07-12 DIAGNOSIS — I491 Atrial premature depolarization: Secondary | ICD-10-CM | POA: Diagnosis not present

## 2023-07-12 DIAGNOSIS — I340 Nonrheumatic mitral (valve) insufficiency: Secondary | ICD-10-CM | POA: Diagnosis not present

## 2023-07-12 DIAGNOSIS — I440 Atrioventricular block, first degree: Secondary | ICD-10-CM | POA: Diagnosis not present

## 2023-07-12 DIAGNOSIS — Z951 Presence of aortocoronary bypass graft: Secondary | ICD-10-CM | POA: Insufficient documentation

## 2023-07-12 DIAGNOSIS — I428 Other cardiomyopathies: Secondary | ICD-10-CM | POA: Diagnosis not present

## 2023-07-12 DIAGNOSIS — Z66 Do not resuscitate: Secondary | ICD-10-CM | POA: Diagnosis not present

## 2023-07-12 DIAGNOSIS — N186 End stage renal disease: Secondary | ICD-10-CM | POA: Diagnosis not present

## 2023-07-12 DIAGNOSIS — I255 Ischemic cardiomyopathy: Secondary | ICD-10-CM | POA: Diagnosis not present

## 2023-07-12 DIAGNOSIS — I132 Hypertensive heart and chronic kidney disease with heart failure and with stage 5 chronic kidney disease, or end stage renal disease: Secondary | ICD-10-CM | POA: Diagnosis not present

## 2023-07-12 DIAGNOSIS — I2510 Atherosclerotic heart disease of native coronary artery without angina pectoris: Secondary | ICD-10-CM

## 2023-07-12 NOTE — Patient Instructions (Addendum)
EKG done today.  No Labs done today.   No medication changes were made. Please continue all current medications as prescribed.  Your physician recommends that you schedule a follow-up appointment in: 3 months with Dr. Gala Romney.   If you have any questions or concerns before your next appointment please send [place] a message through [place] or call our office at [phone].    TO LEAVE A MESSAGE FOR THE NURSE SELECT OPTION 2, PLEASE LEAVE A MESSAGE INCLUDING: YOUR NAME DATE OF BIRTH CALL BACK NUMBER REASON FOR CALL**this is important as we prioritize the call backs  YOU WILL RECEIVE A CALL BACK THE SAME DAY AS LONG AS YOU CALL BEFORE 4:00 PM   Do the following things EVERYDAY: Weigh yourself in the morning before breakfast. Write it down and keep it in a log. Take your medicines as prescribed Eat low salt foods--Limit salt (sodium) to 2000 mg per day.  Stay as active as you can everyday Limit all fluids for the day to less than 2 liters   At the [facility], you and your health needs are our priority. As part of our continuing mission to provide you with exceptional heart care, we have created designated Provider Care Teams. These Care Teams include your primary Cardiologist (physician) and Advanced Practice Providers (APPs- Physician Assistants and Nurse Practitioners) who all work together to provide you with the care you need, when you need it.   You may see any of the following providers on your designated Care Team at your next follow up: Dr Arvilla Meres Dr Marca Ancona Dr. Marcos Eke, NP Robbie Lis, [place] [facility] [place], [place] Brynda Peon, NP Karle Plumber, PharmD   Please be sure to bring in all your medications bottles to every appointment.    Thank you for choosing [facility]

## 2023-09-07 ENCOUNTER — Other Ambulatory Visit: Payer: Self-pay | Admitting: Emergency Medicine

## 2023-10-06 ENCOUNTER — Other Ambulatory Visit (HOSPITAL_COMMUNITY): Payer: Self-pay | Admitting: Physician Assistant

## 2023-10-11 ENCOUNTER — Encounter (HOSPITAL_COMMUNITY): Payer: Medicare HMO | Admitting: Internal Medicine

## 2023-10-13 ENCOUNTER — Ambulatory Visit (HOSPITAL_COMMUNITY)
Admission: RE | Admit: 2023-10-13 | Discharge: 2023-10-13 | Disposition: A | Discharge status: Home / Self Care | Payer: Medicare HMO | Source: Ambulatory Visit | Attending: Internal Medicine | Admitting: Internal Medicine

## 2023-10-13 ENCOUNTER — Encounter (HOSPITAL_COMMUNITY): Payer: Self-pay | Admitting: Internal Medicine

## 2023-10-13 VITALS — BP 128/72 | HR 70 | Wt 172.6 lb

## 2023-10-13 DIAGNOSIS — Z951 Presence of aortocoronary bypass graft: Secondary | ICD-10-CM | POA: Diagnosis not present

## 2023-10-13 DIAGNOSIS — I132 Hypertensive heart and chronic kidney disease with heart failure and with stage 5 chronic kidney disease, or end stage renal disease: Secondary | ICD-10-CM | POA: Diagnosis not present

## 2023-10-13 DIAGNOSIS — E1122 Type 2 diabetes mellitus with diabetic chronic kidney disease: Secondary | ICD-10-CM | POA: Insufficient documentation

## 2023-10-13 DIAGNOSIS — I5022 Chronic systolic (congestive) heart failure: Secondary | ICD-10-CM

## 2023-10-13 DIAGNOSIS — N186 End stage renal disease: Secondary | ICD-10-CM

## 2023-10-13 DIAGNOSIS — I340 Nonrheumatic mitral (valve) insufficiency: Secondary | ICD-10-CM | POA: Diagnosis not present

## 2023-10-13 DIAGNOSIS — I255 Ischemic cardiomyopathy: Secondary | ICD-10-CM | POA: Diagnosis not present

## 2023-10-13 DIAGNOSIS — J90 Pleural effusion, not elsewhere classified: Secondary | ICD-10-CM | POA: Diagnosis not present

## 2023-10-13 DIAGNOSIS — Z992 Dependence on renal dialysis: Secondary | ICD-10-CM | POA: Insufficient documentation

## 2023-10-13 DIAGNOSIS — Z955 Presence of coronary angioplasty implant and graft: Secondary | ICD-10-CM | POA: Diagnosis present

## 2023-10-13 DIAGNOSIS — E1151 Type 2 diabetes mellitus with diabetic peripheral angiopathy without gangrene: Secondary | ICD-10-CM | POA: Insufficient documentation

## 2023-10-13 DIAGNOSIS — I2510 Atherosclerotic heart disease of native coronary artery without angina pectoris: Secondary | ICD-10-CM | POA: Diagnosis present

## 2023-10-13 DIAGNOSIS — Z87891 Personal history of nicotine dependence: Secondary | ICD-10-CM | POA: Insufficient documentation

## 2023-10-13 DIAGNOSIS — I7121 Aneurysm of the ascending aorta, without rupture: Secondary | ICD-10-CM | POA: Diagnosis not present

## 2023-10-13 NOTE — Patient Instructions (Signed)
Medication Changes:  No Changes In Medications at this time.   Testing/Procedures:  CHEST XRAY TODAY   Follow-Up in: 4 MONTHS PLEASE CALL OUR OFFICE AROUND JANUARY/FEBRUARY TO GET SCHEDULED FOR YOUR APPOINTMENT. PHONE NUMBER IS [phone] OPTION 2   At the [facility], you and your health needs are our priority. We have a designated team specialized in the treatment of Heart Failure. This Care Team includes your primary Heart Failure Specialized Cardiologist (physician), Advanced Practice Providers (APPs- Physician Assistants and Nurse Practitioners), and Pharmacist who all work together to provide you with the care you need, when you need it.   You may see any of the following providers on your designated Care Team at your next follow up:  Dr. Arvilla Meres Dr. Marca Ancona Dr. Dorthula Nettles Dr. Theresia Bough Tonye Becket, NP Robbie Lis, [place] [facility] [place], [place] Brynda Peon, NP [place], NP Karle Plumber, PharmD   Please be sure to bring in all your medications bottles to every appointment.   Need to Contact us:  If you have any questions or concerns before your next appointment please send [place] a message through [place] or call our office at [phone].    TO LEAVE A MESSAGE FOR THE NURSE SELECT OPTION 2, PLEASE LEAVE A MESSAGE INCLUDING: YOUR NAME DATE OF BIRTH CALL BACK NUMBER REASON FOR CALL**this is important as we prioritize the call backs  YOU WILL RECEIVE A CALL BACK THE SAME DAY AS LONG AS YOU CALL BEFORE 4:00 PM

## 2023-10-13 NOTE — Progress Notes (Signed)
ADVANCED HF CLINIC NOTE   Primary Care: Erskine Emery, NP Primary Cardiologist: Gypsy Balsam, MD HF Cardiologist: Dr. Gala Romney   HPI: John [name] is a 72 y.o.. male history of coronary artery disease with prior PCI to the RCA, DMII, HTN, ascending aortic aneurysm followed by TCTS, PAD s/p aortofemoral bypass and ESRD on HD,     Presented 2020 w/ CP and elevated Hstop: Cath revealed severe multivessel disease. Underwent a four-vessel CABG 5/20 with LIMA to LAD, SVG to diagonal, SVG to OM, and SVG to PDA.    Found to have chronic pleural effusion 3/21. Had video bronchoscopy done as well as left VATS with drainage of empyema as well as decortication and intercostal nerve block.    Started HD 3/21  Echo 10/22 EF  35-40%.  Echo 9/23 EF 20-25%, mild LVH. RV size is normal, severely elevated pulmonary artery systolic pressure, mild MR, mod TR  PYP 11/23 negative   Echo 01/04/23 EF 20-25% grade II DD, RV moderately down, severe MR, moderate TR  TEE 02/09/23: EF 20%, RV severely HK, mod BAE, 2-3+ central MR, moderate TR RHC 02/09/23: Markedly elevated filling pressures with normal output (in setting of AVF), prominent v waves in PCWP tracing suggesting significant MR, tortuous vessles only able to cannulate LIMA to LAD and SVG to RCA both of which were patent.   Follow up 7/24, functional decline. NYHA IIIb, occasional hypotension at HD on 20 mg midodrine. Arranged for home O2, referred to Palliative in [facility]. Patient elected DNR/DNI and Gold form completed.   Today he returns for HF follow up with his wife. Two weeks ago admitted to [facility] for fluid overload. Says they were able to get fluid off well for the first time. Also had repeat R thoracentesis.  Remains very fatigued. Makes very little urine but having pain when he urinates. Now followed by Hospice. Just got a WC. SOB with minimal activity.  Cardiac studies: - TEE 02/09/23: EF 20%, RV severely HK, mod BAE,  moderate TR  - [facility] 02/09/23: Markedly elevated filling pressures with normal output (in setting of AVF), prominent v waves in PCWP tracing suggesting significant MR, tortuous vessles only able to cannulate LIMA to LAD and SVG to RCA both of which were patent.  - Echo 01/04/23 EF 20-25% grade II DD, RV moderately down, severe MR, moderate TR  - Echo 9/23 EF down to 20 to 25%. mild LVH. RV size is normal. Severely elevated pulmonary artery systolic pressure. LA mildly elevated, RA mod dilated. Mild MR, Mod TR.   - Echo 3/23 EF 30-35%  - Echo 10/22 EF 35-40%  - Lexiscan 2/22 normal  - Echo 8/21, TEE 2/21, Echo 9/21 EF 40-45%  - Echo 5/20 EF 50-55%  - LHC 2020: severe multivessel disease  - Echo 10/18 EF 55-60%    Past Medical History:  Diagnosis Date   Allergy, unspecified, initial encounter 08/30/2020   Anaphylactic shock, unspecified, initial encounter 08/30/2020   Anemia in chronic kidney disease 08/30/2020   Arthritis    Back pain 09/07/2017   Carotid artery stenosis    40-59% LICA, 1-39% RICA 03/27/19 [place]   Chest pain 03/24/2019   Chronic infective endocarditis    Chronic kidney disease (CKD), stage IV (severe) (HCC) 12/01/2018   CKD (chronic kidney disease), stage V (HCC) 03/24/2019   Coronary artery disease    Dependence on renal dialysis (HCC) 08/31/2020   Diabetes mellitus without complication (HCC)    Disorder of phosphorus  metabolism, unspecified 08/31/2020   DM (diabetes mellitus), type 2 with renal complications (HCC) 09/07/2017   Dyspnea    Encounter for screening for respiratory tuberculosis 08/30/2020   End stage renal disease (HCC) 09/07/2017   ESRD (end stage renal disease) (HCC) 08/27/2020   Essential (primary) hypertension 09/07/2017   Fever    History of endocarditis 01/09/2020   Hypercalcemia 10/08/2020   Hyperlipidemia, unspecified 09/07/2017   Hypertension    Hypokalemia 11/13/2021   Iron deficiency anemia, unspecified 08/30/2020   Low back pain 09/07/2017    MI (myocardial infarction) (HCC)    MSSA bacteremia 09/10/2017   Non-ST elevation (NSTEMI) myocardial infarction (HCC) 01/09/2020   NSTEMI (non-ST elevated myocardial infarction) (HCC) 03/24/2019   Other specified arthritis, other site 08/31/2020   Pain, unspecified 08/30/2020   Personal history of other diseases of the circulatory system 08/31/2020   Presence of aortocoronary bypass graft 08/31/2020   Pruritus, unspecified 08/30/2020   Renal disorder    Restless legs syndrome 09/06/2020   S/P CABG x 4 04/03/2019   S/P Video Bronchoscopy, Right VATS with drainage of pleural effusion, decortication, intercostal nerve block 02/20/2020   Secondary hyperparathyroidism of renal origin (HCC) 08/30/2020   Thoracic aortic aneurysm (TAA) (HCC)    4.3 cm ascending TAA 02/02/20 CT   Type 2 diabetes mellitus with diabetic nephropathy (HCC) 08/30/2020   Current Outpatient Medications  Medication Sig Dispense Refill   acetaminophen (TYLENOL) 325 MG tablet Take 650 mg by mouth every 6 (six) hours as needed for moderate pain.     aspirin EC 81 MG tablet Take 81 mg by mouth daily.     atorvastatin (LIPITOR) 40 MG tablet Take 40 mg by mouth at bedtime.     b complex-vitamin c-folic acid (NEPHRO-VITE) 0.8 MG TABS tablet Take 1 tablet by mouth at bedtime.     clobetasol (TEMOVATE) 0.05 % external solution Apply 1 Application topically 2 (two) times daily.     fluticasone (FLONASE) 50 MCG/ACT nasal spray Place 1 spray into both nostrils daily as needed for allergies or rhinitis.     gabapentin (NEURONTIN) 100 MG capsule Take 2 capsules (200 mg total) by mouth at bedtime.     ibuprofen (ADVIL) 200 MG tablet Take 200 mg by mouth daily as needed for moderate pain.     ketoconazole (NIZORAL) 2 % shampoo Apply 1 Application topically 3 (three) times a week.     midodrine (PROAMATINE) 10 MG tablet 3 TIMES A DAY ON TUESDAY, THURSDAY AND SATURDAY 135 tablet 1   mupirocin ointment (BACTROBAN) 2 % Apply 1 Application topically 3  (three) times daily.     Nutritional Supplements (,FEEDING SUPPLEMENT, PROSOURCE PLUS) liquid Take 30 mLs by mouth 2 (two) times daily between meals.     OVER THE COUNTER MEDICATION Place 1 drop into both eyes 2 (two) times daily as needed (dry eyes). Hyper Tears eye drops/Unknown strength     pantoprazole (PROTONIX) 40 MG tablet TAKE 1 TABLET BY MOUTH EVERY DAY 90 tablet 1   RENVELA 800 MG tablet Take 1,600 mg by mouth 3 (three) times daily with meals.     rOPINIRole (REQUIP) 0.5 MG tablet Take 0.5 mg by mouth See admin instructions. Take 0.5 at bedtime, take 0.5 mg before dialysis (Tues, Thurs, and Sat)     SENSIPAR 30 MG tablet Take 30 mg by mouth daily with supper.     tamsulosin (FLOMAX) 0.4 MG CAPS capsule Take 0.4 mg by mouth daily.     torsemide (  DEMADEX) 100 MG tablet Take 150 mg by mouth daily.     zolpidem (AMBIEN) 10 MG tablet Take 10 mg by mouth at bedtime.     Current Facility-Administered Medications  Medication Dose Route Frequency Provider Last Rate Last Admin   technetium tetrofosmin (TC-MYOVIEW) injection 10.9 millicurie  10.9 millicurie Intravenous Once PRN Revankar, Aundra Dubin, MD       technetium tetrofosmin (TC-MYOVIEW) injection 31.4 millicurie  31.4 millicurie Intravenous Once PRN Revankar, Aundra Dubin, MD       Allergies  Allergen Reactions   Penicillins Rash and Other (See Comments)    Did it involve swelling of the face/tongue/throat, SOB, or low BP? No Did it involve sudden or severe rash/hives, skin peeling, or any reaction on the inside of your mouth or nose? Yes Did you need to seek medical attention at a hospital or doctor's office? Yes When did it last happen?      Childhood allergy If all above answers are "NO", may proceed with cephalosporin use.    Social History   Socioeconomic History   Marital status: Married    Spouse name: Not on file   Number of children: Not on file   Years of education: Not on file   Highest education level: Not on file   Occupational History   Not on file  Tobacco Use   Smoking status: Former    Current packs/day: 0.00    Average packs/day: 2.0 packs/day for 30.0 years (60.0 ttl pk-yrs)    Types: Cigarettes    Start date: 42    Quit date: 1998    Years since quitting: 26.9   Smokeless tobacco: Never  Vaping Use   Vaping status: Never Used  Substance and Sexual Activity   Alcohol use: Yes    Comment: Occasionally.   Drug use: Never   Sexual activity: Not on file  Other Topics Concern   Not on file  Social History Narrative   Not on file   Social Determinants of Health   Financial Resource Strain: Not on file  Food Insecurity: Not on file  Transportation Needs: Not on file  Physical Activity: Not on file  Stress: Not on file  Social Connections: Not on file  Intimate Partner Violence: Not on file   Family History  Problem Relation Age of Onset   Cancer Maternal Grandmother    Brain cancer Maternal Grandmother    Colon cancer Neg Hx    Rectal cancer Neg Hx    Stomach cancer Neg Hx    BP 128/72   Pulse 70   Wt 78.3 kg (172 lb 9.6 oz)   SpO2 90%   BMI 22.16 kg/m   Wt Readings from Last 3 Encounters:  10/13/23 78.3 kg (172 lb 9.6 oz)  07/12/23 85 kg (187 lb 6.4 oz)  05/31/23 87.3 kg (192 lb 6.4 oz)   PHYSICAL EXAM: General:  Sitting on exam table No resp difficulty HEENT: normal Neck: supple. no JVD. Carotids 2+ bilat; no bruits. No lymphadenopathy or thryomegaly appreciated. Cor: PMI nondisplaced. Regular rate & rhythm. No rubs, gallops or murmurs. Lungs: dull/decreased 1/2 up on right Abdomen: soft, nontender, nondistended. No hepatosplenomegaly. No bruits or masses. Good bowel sounds. Extremities: no cyanosis, clubbing, rash, edema Neuro: alert & orientedx3, cranial nerves grossly intact. moves all 4 extremities w/o difficulty. Affect pleasant  ECG (personally reviewed): SR with 1AVB, +PACs   ASSESSMENT & PLAN: Chronic systolic heart failure - Echo 16/10 EF  55-60% - CABG x4 2020 -  Echo 8/21, TEE 2/21, Echo 9/21 EF 40-45% - Echo 10/22 EF 35-40% - EF 3/23 EF 30-35% - Echo 9/23 EF 20 to 25%. mild LVH. RV size is normal. Severely elevated pulmonary artery systolic pressure. Mild MR, Mod TR.  - Echo 01/04/23 showed EF EF 20-25% grade II DD, RV moderately down, RVSP 70 mmHg, severe MR, moderate TR - PYP negative - TEE 02/09/23: EF 20%, RV severely HK, mod BAE, 2-3+ central MR, moderate TR - R/LHC 02/09/23: Markedly elevated filling pressures with normal output (in setting of AVF), prominent v waves in PCWP tracing suggesting significant MR, tortuous vessles only able to cannulate LIMA to LAD and SVG to RCA both of which were patent. - TEE/RHC reviewed with structural team. MR not severe enough for mitraclip and likely wound not improve his symptoms at this point.  -  Volume status much improved after recent admission to [facility] with improved UF - Continue midodrine 20 mg tid for BP support - Unable to start GDMT w/ ESRD requiring midodrine for BP support. - Not candidate for VAD/transplant with age and ESRD.   2. MR - Severe on TTE in 02/24 - Moderate in severity on TEE 03/34 - RHC 3/24 with prominent v waves in PCWP tracing suggesting significant MR - Not severe enough for MitraClip as above.  - Nochange  3. CAD, ICM s/p CABG x4 - Previous smoker, quit in 62' - s/p CABG x4 2020 - LIMA to LAD and SVG to RCA patent on LHC in 03/24. Unable to cannulate other vessels - No angina - Continue ASA and statin  4. ESRD on HD - T/Th/Sat schedule. - Having low BPs at dialysis - On midodrine  5. R pleural effusion, recurrent - Get CXR - Will discuss Pleurex with Dr. Donata Clay  6. DM II - Managed by PCP. - Hgb A1c last 6.3   7. GOC:  - He is followed by Hopsice in [facility]. - He is DNR/DNI.    Arvilla Meres, MD  8:42 AM

## 2023-10-18 ENCOUNTER — Other Ambulatory Visit: Payer: Self-pay

## 2023-10-18 DIAGNOSIS — J90 Pleural effusion, not elsewhere classified: Secondary | ICD-10-CM

## 2023-10-25 ENCOUNTER — Institutional Professional Consult (permissible substitution): Payer: Medicare HMO | Admitting: Cardiothoracic Surgery

## 2023-10-25 ENCOUNTER — Ambulatory Visit
Admission: RE | Admit: 2023-10-25 | Discharge: 2023-10-25 | Disposition: A | Discharge status: Home / Self Care | Payer: Medicare HMO | Source: Ambulatory Visit | Attending: Cardiothoracic Surgery | Admitting: Cardiothoracic Surgery

## 2023-10-25 ENCOUNTER — Encounter: Payer: Self-pay | Admitting: Cardiothoracic Surgery

## 2023-10-25 ENCOUNTER — Other Ambulatory Visit: Payer: Self-pay | Admitting: *Deleted

## 2023-10-25 VITALS — BP 134/73 | HR 69 | Resp 18 | Ht 74.0 in | Wt 169.0 lb

## 2023-10-25 DIAGNOSIS — J90 Pleural effusion, not elsewhere classified: Secondary | ICD-10-CM | POA: Diagnosis not present

## 2023-10-25 NOTE — Progress Notes (Signed)
HPI: Patient examined, images of most recent echocardiogram and chest x-ray personally reviewed and counseled patient.  72 year old male status post CABG x 4 about 5 years ago presents with end-stage renal failure, heart failure, EF 30% with MR, TR and pulmonary hypertension and recurrent symptomatic right pleural effusion.  The patient has had right thoracentesis for symptomatic pleural effusion requiring hospitalization twice at [facility].  He is starting to have recurrent symptoms of shortness of breath and fullness despite full dialysis.  Chest x-ray today shows reaccumulating right pleural effusion, status post sternotomy and CABG.  The patient has recently been placed on home oxygen 2 to 3 L/min to assist with his shortness of breath from recurrent pleural effusion and heart failure.  The patient is in sinus rhythm and is on no anticoagulation other than aspirin.  He is a type II diabetic and is allergic penicillin.  He has had no previous surgery or trauma to the right chest.  He has right arm AV fistula for HD access  Current Outpatient Medications  Medication Sig Dispense Refill   acetaminophen (TYLENOL) 325 MG tablet Take 650 mg by mouth every 6 (six) hours as needed for moderate pain.     aspirin EC 81 MG tablet Take 81 mg by mouth daily.     atorvastatin (LIPITOR) 40 MG tablet Take 40 mg by mouth at bedtime.     b complex-vitamin c-folic acid (NEPHRO-VITE) 0.8 MG TABS tablet Take 1 tablet by mouth at bedtime.     clobetasol (TEMOVATE) 0.05 % external solution Apply 1 Application topically 2 (two) times daily.     fluticasone (FLONASE) 50 MCG/ACT nasal spray Place 1 spray into both nostrils daily as needed for allergies or rhinitis.     gabapentin (NEURONTIN) 100 MG capsule Take 2 capsules (200 mg total) by mouth at bedtime.     ibuprofen (ADVIL) 200 MG tablet Take 200 mg by mouth daily as needed for moderate pain.     ketoconazole (NIZORAL) 2 % shampoo Apply 1 Application  topically 3 (three) times a week.     midodrine (PROAMATINE) 10 MG tablet 3 TIMES A DAY ON TUESDAY, THURSDAY AND SATURDAY 135 tablet 1   mupirocin ointment (BACTROBAN) 2 % Apply 1 Application topically 3 (three) times daily.     Nutritional Supplements (,FEEDING SUPPLEMENT, PROSOURCE PLUS) liquid Take 30 mLs by mouth 2 (two) times daily between meals.     OVER THE COUNTER MEDICATION Place 1 drop into both eyes 2 (two) times daily as needed (dry eyes). Hyper Tears eye drops/Unknown strength     pantoprazole (PROTONIX) 40 MG tablet TAKE 1 TABLET BY MOUTH EVERY DAY 90 tablet 1   RENVELA 800 MG tablet Take 1,600 mg by mouth 3 (three) times daily with meals.     rOPINIRole (REQUIP) 0.5 MG tablet Take 0.5 mg by mouth See admin instructions. Take 0.5 at bedtime, take 0.5 mg before dialysis (Tues, Thurs, and Sat)     SENSIPAR 30 MG tablet Take 30 mg by mouth daily with supper.     tamsulosin (FLOMAX) 0.4 MG CAPS capsule Take 0.4 mg by mouth daily.     torsemide (DEMADEX) 100 MG tablet Take 150 mg by mouth daily.     zolpidem (AMBIEN) 10 MG tablet Take 10 mg by mouth at bedtime.     Current Facility-Administered Medications  Medication Dose Route Frequency Provider Last Rate Last Admin   technetium tetrofosmin (TC-MYOVIEW) injection 10.9 millicurie  10.9 millicurie Intravenous Once PRN Revankar,  Aundra Dubin, MD       technetium tetrofosmin (TC-MYOVIEW) injection 31.4 millicurie  31.4 millicurie Intravenous Once PRN Revankar, Aundra Dubin, MD         Physical Exam:Blood pressure 134/73, pulse 69, resp. rate 18, height 6\' 2"  (1.88 m), weight 169 lb (76.7 kg), SpO2 98%.        Physical Exam  General: Frail-appearing 72 year old man uses a cane for ambulation. HEENT: Normocephalic pupils equal , dentition adequate Neck: Supple with moderate JVD but without adenopathy, or bruit Chest: Well-healed sternotomy incision.  Regular rhythm.  Grade 2/6 systolic murmur Diminished breath sounds on the right, normal  breath sounds on the left Cardiovascular: Regular rate and rhythm, no murmur, no gallop, peripheral pulses 1+ in lower extremities             Abdomen:  Soft, nontender, no palpable mass or organomegaly Extremities: Warm, well-perfused, no clubbing cyanosis or tenderness, 2+ pedal edema bilaterally               Rectal/GU: Deferred Neuro: Grossly non--focal and symmetrical throughout Skin: Clean and dry without rash or ulceration  Diagnostic Tests: Today's chest x-ray shows moderate right pleural effusion Right heart cath 6 months ago shows cardiac index 3.4 L/min, CVP 13, PA pressure 65/24, PA saturation 70%, pulmonary wedge pressure 27   Impression: Symptomatic recurrent right pleural effusion with underlying ischemic cardiomyopathy and pulmonary hypertension with moderate TR, mild AS moderate MR  End-stage renal disease on hemodialysis schedule Tuesday Thursday Saturday  Type 2 diabetes mellitus Status post CABG x 4  Patient is a DNR status but understands during the procedure he will potentially need cardiac medications and assistance with breathing and DNR will be rescinded.  Patient has a significant penicillin allergy and will need vancomycin before the procedure  Plan: Patient scheduled for right Pleurx catheter placement in OR under MAR anesthesia on December 6.  Due to his frail status we will plan admission overnight for observation and hemodialysis on his regular schedule,, Saturday.   Lovett Sox, MD [facility] [phone]

## 2023-10-28 ENCOUNTER — Encounter (HOSPITAL_COMMUNITY): Payer: Self-pay | Admitting: Cardiothoracic Surgery

## 2023-10-28 NOTE — Anesthesia Preprocedure Evaluation (Addendum)
Anesthesia Evaluation  Patient identified by MRN, date of birth, ID band Patient awake    Reviewed: Allergy & Precautions, H&P , NPO status , Patient's Chart, lab work & pertinent test results  Airway Mallampati: I  TM Distance: >3 FB Neck ROM: Full    Dental no notable dental hx. (+) Edentulous Upper, Edentulous Lower, Dental Advisory Given   Pulmonary shortness of breath and Long-Term Oxygen Therapy, sleep apnea , former smoker   Pulmonary exam normal breath sounds clear to auscultation       Cardiovascular hypertension, + CAD, + Past MI, + CABG and +CHF  + Valvular Problems/Murmurs MR  Rhythm:Regular Rate:Normal     Neuro/Psych   Anxiety Depression    negative neurological ROS  negative psych ROS   GI/Hepatic negative GI ROS, Neg liver ROS,,,  Endo/Other  diabetes    Renal/GU ESRF and DialysisRenal diseasenegative Renal ROS  negative genitourinary   Musculoskeletal  (+) Arthritis , Osteoarthritis,    Abdominal   Peds  Hematology  (+) Blood dyscrasia, anemia   Anesthesia Other Findings   Reproductive/Obstetrics negative OB ROS                             Anesthesia Physical Anesthesia Plan  ASA: 4  Anesthesia Plan: MAC   Post-op Pain Management: Minimal or no pain anticipated and Ofirmev IV (intra-op)*   Induction: Intravenous  PONV Risk Score and Plan: 2 and Ondansetron  Airway Management Planned: Natural Airway and Simple Face Mask  Additional Equipment:   Intra-op Plan:   Post-operative Plan:   Informed Consent: I have reviewed the patients History and Physical, chart, labs and discussed the procedure including the risks, benefits and alternatives for the proposed anesthesia with the patient or authorized representative who has indicated his/her understanding and acceptance.     Dental advisory given  Plan Discussed with: CRNA  Anesthesia Plan Comments: (PAT note  by Antionette Poles, PA-C:  72 year old male followed by Dr. Gala Romney in the advanced heart failure clinic for history of CAD s/p CABG x 4 in 2020, chronic systolic heart failure (EF 25 to 30% by echo 06/2023), moderate-severe MR, recurrent right pleural effusion.  Not on GDMT due to ESRD requiring midodrine for BP support.  Not felt to be candidate for VATS/transplant with age and ESRD.  Last seen by Dr. Gala Romney on 10/13/2023.  Noted to have been recently admitted to [facility] for fluid overload and also required repeat right thoracentesis.  He continues to have persistent fatigue and shortness of breath with minimal activity.  He is now on home O2 2 to 3 L/min.  He was recommended to follow-up with Dr. Maren Beach for consideration of Pleurx.  ESRD on HD Tuesday Thursday Saturday.  He is followed by hospice in [facility], he is a DNR/DNI.  Patient will need day of surgery labs and evaluation.   EKG 07/12/2023: Sinus rhythm with 1st degree A-V block with Premature atrial complexes rate 86. Left axis deviation.Pulmonary disease pattern.. Minimal voltage criteria for LVH, may be normal variant ( Cornell product ). ST & T wave abnormality, consider lateral ischemia  CHEST - 2 VIEW 10/25/23:  COMPARISON:  10/13/2023  FINDINGS: Similar chest radiograph. Gross cardiomegaly status post median sternotomy and CABG. Large right pleural effusion and associated atelectasis or consolidation. Trace left pleural effusion. Mild diffuse interstitial opacity. No acute osseous findings.  IMPRESSION: 1. Large right pleural effusion and associated atelectasis or consolidation. Trace left  pleural effusion. 2. Mild diffuse interstitial opacity, consistent with edema. 3. Gross cardiomegaly status post median sternotomy and CABG.  TTE 06/27/2023: Left ventricular ejection fraction is 25 to 30%. Left atrium severely dilated. Right atrium is severely dilated. There is mild aortic stenosis. Mean pressure  gradient 7 mmHg. NDI = 0.45. There is moderate tricuspid valve regurgitation. The pressure is 75 mmHg. Pulmonary artery systolic pressure is severely elevated.  Right heart cath 02/09/2023: Assessment: 1. Markedly elevated filling pressures with normal output (in setting of AVF) 2. Prominent v-weaves in PCPW tracing suggestive of significant MR 3. Given tortuous vessels only able to cannulate the LIMA to LAD and SVC to RCA both of which were widely patent and supplied the majority of the myocardium   Plan/Discussion:  Suspect main issue is his severe biventricular dysfunction but will review case with structural team regarding his mitral regurgitation.   )        Anesthesia Quick Evaluation

## 2023-10-28 NOTE — Progress Notes (Signed)
Anesthesia Chart Review: Same day workup  72 year old male followed by Dr. Gala Romney in the advanced heart failure clinic for history of CAD s/p CABG x 4 in 2020, chronic systolic heart failure (EF 25 to 30% by echo 06/2023), moderate-severe MR, recurrent right pleural effusion.  Not on GDMT due to ESRD requiring midodrine for BP support.  Not felt to be candidate for VATS/transplant with age and ESRD.  Last seen by Dr. Gala Romney on 10/13/2023.  Noted to have been recently admitted to [facility] for fluid overload and also required repeat right thoracentesis.  He continues to have persistent fatigue and shortness of breath with minimal activity.  He is now on home O2 2 to 3 L/min.  He was recommended to follow-up with Dr. Maren Beach for consideration of Pleurx.  ESRD on HD Tuesday Thursday Saturday.  He is followed by hospice in [facility], he is a DNR/DNI.  Patient will need day of surgery labs and evaluation.   EKG 07/12/2023: Sinus rhythm with 1st degree A-V block with Premature atrial complexes rate 86. Left axis deviation.Pulmonary disease pattern.. Minimal voltage criteria for LVH, may be normal variant ( Cornell product ). ST & T wave abnormality, consider lateral ischemia  CHEST - 2 VIEW 10/25/23:   COMPARISON:  10/13/2023   FINDINGS: Similar chest radiograph. Gross cardiomegaly status post median sternotomy and CABG. Large right pleural effusion and associated atelectasis or consolidation. Trace left pleural effusion. Mild diffuse interstitial opacity. No acute osseous findings.   IMPRESSION: 1. Large right pleural effusion and associated atelectasis or consolidation. Trace left pleural effusion. 2. Mild diffuse interstitial opacity, consistent with edema. 3. Gross cardiomegaly status post median sternotomy and CABG.  TTE 06/27/2023: Left ventricular ejection fraction is 25 to 30%. Left atrium severely dilated. Right atrium is severely dilated. There is mild aortic  stenosis. Mean pressure gradient 7 mmHg. NDI = 0.45. There is moderate tricuspid valve regurgitation. The pressure is 75 mmHg. Pulmonary artery systolic pressure is severely elevated.  Right heart cath 02/09/2023: Assessment: 1. Markedly elevated filling pressures with normal output (in setting of AVF) 2. Prominent v-weaves in PCPW tracing suggestive of significant MR 3. Given tortuous vessels only able to cannulate the LIMA to LAD and SVC to RCA both of which were widely patent and supplied the majority of the myocardium    Plan/Discussion:   Suspect main issue is his severe biventricular dysfunction but will review case with structural team regarding his mitral regurgitation.      Zannie Cove [facility] Short Stay Center/Anesthesiology Phone [phone] 10/28/2023 9:45 AM

## 2023-10-28 NOTE — Progress Notes (Incomplete)
SDW CALL  Patient was given pre-op instructions over the phone. The opportunity was given for the patient to ask questions. No further questions asked. Patient verbalized understanding of instructions given.   PCP - Zipporah Plants Cardiologist - Judye Bos  PPM/ICD -  Device Orders -  Rep Notified -   Chest x-ray - 10/25/23 EKG - 07/12/23 Stress Test - 12/10/21 ECHO - 02/09/23 Cardiac Cath - 02/09/23  Sleep Study - 09/01/22 CPAP -   Fasting Blood Sugar -  Checks Blood Sugar _____ times a day  Blood Thinner Instructions: Aspirin Instructions:  ERAS Protcol - clears until 0900 PRE-SURGERY Ensure or G2-   COVID TEST- no Covid test needed per Darius Bump RN   Anesthesia review: yes  Patient denies shortness of breath, fever, cough and chest pain over the phone call    Surgical Instructions    Your procedure is scheduled on December 6  Report to [facility] Main Entrance "A" at 0930 A.M., then check in with the Admitting office.  Call this number if you have problems the morning of surgery:  [phone]    Remember:  Do not eat after midnight the night before your surgery  You may drink clear liquids until 0900 am the morning of your surgery.   Clear liquids allowed are: Water, Non-Citrus Juices (without pulp), Carbonated Beverages, Clear Tea, Black Coffee ONLY (NO MILK, CREAM OR POWDERED CREAMER of any kind), and Gatorade   Take these medicines the morning of surgery with A SIP OF WATER: Protonix,Flomax. PRN- Tylenol    As of today, STOP taking any Aspirin (unless otherwise instructed by your surgeon) Aleve, Naproxen, Ibuprofen, Motrin, Advil, Goody's, BC's, all herbal medications, fish oil, and all vitamins.  [place] is not responsible for any belongings or valuables. .   Do NOT Smoke (Tobacco/Vaping)  24 hours prior to your procedure  If you use a CPAP at night, you may bring your mask for your overnight stay.   Contacts, glasses, hearing aids,  dentures or partials may not be worn into surgery, please bring cases for these belongings   Patients discharged the day of surgery will not be allowed to drive home, and someone needs to stay with them for 24 hours.   SURGICAL WAITING ROOM VISITATION You may have 1 visitor in the pre-op area at a time determined by the pre-op nurse. (Visitor may not switch out) Patients having surgery or a procedure in a hospital may have two support people in the waiting room. Children under the age of 27 must have an adult with them who is not the patient. They may stay in the waiting area during the procedure and may switch out with other visitors. If the patient needs to stay at the hospital during part of their recovery, the visitor guidelines for inpatient rooms apply.  Please refer to the [facility] website for the visitor guidelines for Inpatients (after your surgery is over and you are in a regular room).     Special instructions:    Oral Hygiene is also important to reduce your risk of infection.  Remember - BRUSH YOUR TEETH THE MORNING OF SURGERY WITH YOUR REGULAR TOOTHPASTE   Day of Surgery:  Take a shower the day of or night before with antibacterial soap. Wear Clean/Comfortable clothing the morning of surgery Do not apply any deodorants/lotions.   Do not wear jewelry or makeup Do not wear lotions, powders, perfumes/colognes, or deodorant. Do not shave 48 hours prior to surgery.  Men  may shave face and neck. Do not bring valuables to the hospital. Do not wear nail polish, gel polish, artificial nails, or any other type of covering on natural nails (fingers and toes) If you have artificial nails or gel coating that need to be removed by a nail salon, please have this removed prior to surgery. Artificial nails or gel coating may interfere with anesthesia's ability to adequately monitor your vital signs. Remember to brush your teeth WITH YOUR REGULAR TOOTHPASTE.

## 2023-10-28 NOTE — Progress Notes (Signed)
SDW call  Patient was given pre-op instructions over the phone. Patient verbalized understanding of instructions provided.     PCP - Drusilla Kanner, NP Cardiologist - Dr.  Gypsy Balsam Pulmonary:    PPM/ICD - denies Device Orders - na Rep Notified - na   Chest x-ray - 10/25/2023 EKG -  07/12/2023 Stress Test -12/10/2021 ECHO - 02/09/2023 Cardiac Cath - 02/09/2023  Sleep Study/sleep apnea/CPAP: diagnosed with sleep apnea, does not use a CPAP  Type II diabetic Fasting Blood sugar range: does not check How often check sugars: does not check   Blood Thinner Instructions: denies Aspirin Instructions:Hold DOS   ERAS Protcol - NPO   COVID TEST- na    Anesthesia review: Yes. HTN, DM, dialysis, home oxygen   Patient denies shortness of breath, fever, cough and chest pain over the phone call  Your procedure is scheduled on Friday October 29, 2023  Report to [facility] Main Entrance "A" at 0930 A.M., then check in with the Admitting office.  Call this number if you have problems the morning of surgery:  [phone]   If you have any questions prior to your surgery date call [phone]: Open Monday-Friday 8am-4pm If you experience any cold or flu symptoms such as cough, fever, chills, shortness of breath, etc. between now and your scheduled surgery, please notify us at the above number     Remember:  Do not eat or drink after midnight the night before your surgery  Take these medicines the morning of surgery with A SIP OF WATER:  Protonix, flomax, lexapro  As needed: Tylenol, flonase  As of today, STOP taking any Aleve, Naproxen, Ibuprofen, Motrin, Advil, Goody's, BC's, all herbal medications, fish oil, and all vitamins.

## 2023-10-29 ENCOUNTER — Other Ambulatory Visit: Payer: Self-pay

## 2023-10-29 ENCOUNTER — Ambulatory Visit (HOSPITAL_COMMUNITY): Payer: Medicare HMO | Admitting: Physician Assistant

## 2023-10-29 ENCOUNTER — Inpatient Hospital Stay (HOSPITAL_COMMUNITY): Payer: Medicare HMO

## 2023-10-29 ENCOUNTER — Encounter (HOSPITAL_COMMUNITY): Payer: Self-pay | Admitting: Cardiothoracic Surgery

## 2023-10-29 ENCOUNTER — Observation Stay (HOSPITAL_COMMUNITY)
Admission: RE | Admit: 2023-10-29 | Discharge: 2023-10-30 | Disposition: A | Discharge status: Home / Self Care | Payer: Medicare HMO | Attending: Cardiothoracic Surgery | Admitting: Cardiothoracic Surgery

## 2023-10-29 ENCOUNTER — Ambulatory Visit (HOSPITAL_COMMUNITY): Payer: Medicare HMO

## 2023-10-29 ENCOUNTER — Encounter (HOSPITAL_COMMUNITY)
Admission: RE | Disposition: A | Discharge status: Home / Self Care | Payer: Self-pay | Source: Home / Self Care | Attending: Cardiothoracic Surgery

## 2023-10-29 DIAGNOSIS — J90 Pleural effusion, not elsewhere classified: Principal | ICD-10-CM | POA: Insufficient documentation

## 2023-10-29 DIAGNOSIS — N186 End stage renal disease: Secondary | ICD-10-CM | POA: Diagnosis not present

## 2023-10-29 DIAGNOSIS — Z79899 Other long term (current) drug therapy: Secondary | ICD-10-CM | POA: Diagnosis not present

## 2023-10-29 DIAGNOSIS — I509 Heart failure, unspecified: Secondary | ICD-10-CM | POA: Diagnosis not present

## 2023-10-29 DIAGNOSIS — E1122 Type 2 diabetes mellitus with diabetic chronic kidney disease: Secondary | ICD-10-CM | POA: Insufficient documentation

## 2023-10-29 DIAGNOSIS — Z992 Dependence on renal dialysis: Secondary | ICD-10-CM | POA: Diagnosis not present

## 2023-10-29 DIAGNOSIS — Z951 Presence of aortocoronary bypass graft: Secondary | ICD-10-CM | POA: Diagnosis not present

## 2023-10-29 DIAGNOSIS — I2510 Atherosclerotic heart disease of native coronary artery without angina pectoris: Secondary | ICD-10-CM | POA: Insufficient documentation

## 2023-10-29 DIAGNOSIS — I132 Hypertensive heart and chronic kidney disease with heart failure and with stage 5 chronic kidney disease, or end stage renal disease: Secondary | ICD-10-CM | POA: Insufficient documentation

## 2023-10-29 DIAGNOSIS — Z87891 Personal history of nicotine dependence: Secondary | ICD-10-CM | POA: Insufficient documentation

## 2023-10-29 DIAGNOSIS — Z7982 Long term (current) use of aspirin: Secondary | ICD-10-CM | POA: Diagnosis not present

## 2023-10-29 HISTORY — PX: CHEST TUBE INSERTION: SHX231

## 2023-10-29 HISTORY — DX: Anxiety disorder, unspecified: F41.9

## 2023-10-29 HISTORY — DX: Depression, unspecified: F32.A

## 2023-10-29 HISTORY — DX: Sleep apnea, unspecified: G47.30

## 2023-10-29 LAB — CBC
HCT: 39.6 % (ref 39.0–52.0)
Hemoglobin: 12.3 g/dL — ABNORMAL LOW (ref 13.0–17.0)
MCH: 30.1 pg (ref 26.0–34.0)
MCHC: 31.1 g/dL (ref 30.0–36.0)
MCV: 97.1 fL (ref 80.0–100.0)
Platelets: 95 10*3/uL — ABNORMAL LOW (ref 150–400)
RBC: 4.08 MIL/uL — ABNORMAL LOW (ref 4.22–5.81)
RDW: 16.9 % — ABNORMAL HIGH (ref 11.5–15.5)
WBC: 5 10*3/uL (ref 4.0–10.5)
nRBC: 0 % (ref 0.0–0.2)

## 2023-10-29 LAB — APTT: aPTT: 47 s — ABNORMAL HIGH (ref 24–36)

## 2023-10-29 LAB — COMPREHENSIVE METABOLIC PANEL
ALT: 13 U/L (ref 0–44)
AST: 15 U/L (ref 15–41)
Albumin: 2.9 g/dL — ABNORMAL LOW (ref 3.5–5.0)
Alkaline Phosphatase: 114 U/L (ref 38–126)
Anion gap: 15 (ref 5–15)
BUN: 12 mg/dL (ref 8–23)
CO2: 27 mmol/L (ref 22–32)
Calcium: 10 mg/dL (ref 8.9–10.3)
Chloride: 93 mmol/L — ABNORMAL LOW (ref 98–111)
Creatinine, Ser: 3.74 mg/dL — ABNORMAL HIGH (ref 0.61–1.24)
GFR, Estimated: 17 mL/min — ABNORMAL LOW (ref >60)
Glucose, Bld: 94 mg/dL (ref 70–99)
Potassium: 3.3 mmol/L — ABNORMAL LOW (ref 3.5–5.1)
Sodium: 135 mmol/L (ref 135–145)
Total Bilirubin: 2 mg/dL — ABNORMAL HIGH (ref <1.2)
Total Protein: 6.3 g/dL — ABNORMAL LOW (ref 6.5–8.1)

## 2023-10-29 LAB — SURGICAL PCR SCREEN
MRSA, PCR: NEGATIVE
Staphylococcus aureus: POSITIVE — AB

## 2023-10-29 LAB — GLUCOSE, CAPILLARY
Glucose-Capillary: 86 mg/dL (ref 70–99)
Glucose-Capillary: 76 mg/dL (ref 70–99)
Glucose-Capillary: 82 mg/dL (ref 70–99)

## 2023-10-29 LAB — PROTIME-INR
INR: 1.2 (ref 0.8–1.2)
Prothrombin Time: 15.2 s (ref 11.4–15.2)

## 2023-10-29 LAB — HEPATITIS B SURFACE ANTIGEN: Hepatitis B Surface Ag: NONREACTIVE

## 2023-10-29 SURGERY — INSERTION, PLEURAL DRAINAGE CATHETER
Anesthesia: Monitor Anesthesia Care | Site: Chest | Laterality: Right

## 2023-10-29 MED ORDER — RENA-VITE PO TABS: 1.0000 | ORAL_TABLET | Freq: Every day | ORAL | Status: DC

## 2023-10-29 MED ORDER — CINACALCET HCL 30 MG PO TABS
30.0000 mg | ORAL_TABLET | Freq: Every day | ORAL | Status: DC
  Administered 2023-10-29: 30 mg via ORAL
  Filled 2023-10-29 (×2): qty 1

## 2023-10-29 MED ORDER — MIDODRINE HCL 5 MG PO TABS
10.0000 mg | ORAL_TABLET | Freq: Every day | ORAL | Status: DC
  Administered 2023-10-30: 10 mg via ORAL
  Filled 2023-10-29: qty 2

## 2023-10-29 MED ORDER — SODIUM CHLORIDE 0.9 % IV SOLN: 250.0000 mL | INTRAVENOUS | Status: AC | PRN

## 2023-10-29 MED ORDER — HEPARIN SODIUM (PORCINE) 1000 UNIT/ML IJ SOLN
INTRAMUSCULAR | Status: AC
  Filled 2023-10-29: qty 1

## 2023-10-29 MED ORDER — ONDANSETRON HCL 4 MG/2ML IJ SOLN
INTRAMUSCULAR | Status: AC
Start: 2023-10-29 — End: ?
  Filled 2023-10-29: qty 4

## 2023-10-29 MED ORDER — PANTOPRAZOLE SODIUM 40 MG PO TBEC
40.0000 mg | DELAYED_RELEASE_TABLET | Freq: Every day | ORAL | Status: DC
  Administered 2023-10-30: 40 mg via ORAL
  Filled 2023-10-29: qty 1

## 2023-10-29 MED ORDER — ROCURONIUM BROMIDE 10 MG/ML (PF) SYRINGE
PREFILLED_SYRINGE | INTRAVENOUS | Status: AC
  Filled 2023-10-29: qty 20

## 2023-10-29 MED ORDER — CHLORHEXIDINE GLUCONATE 0.12 % MT SOLN
OROMUCOSAL | Status: AC
  Administered 2023-10-29: 15 mL via OROMUCOSAL
  Filled 2023-10-29: qty 15

## 2023-10-29 MED ORDER — ASPIRIN 81 MG PO TBEC
81.0000 mg | DELAYED_RELEASE_TABLET | Freq: Every day | ORAL | Status: DC
  Administered 2023-10-30: 81 mg via ORAL
  Filled 2023-10-29: qty 1

## 2023-10-29 MED ORDER — 0.9 % SODIUM CHLORIDE (POUR BTL) OPTIME
TOPICAL | Status: DC | PRN
Start: 2023-10-29 — End: 2023-10-29
  Administered 2023-10-29: 1000 mL

## 2023-10-29 MED ORDER — CHLORHEXIDINE GLUCONATE CLOTH 2 % EX PADS
6.0000 | MEDICATED_PAD | Freq: Every day | CUTANEOUS | Status: DC
  Administered 2023-10-30: 6 via TOPICAL

## 2023-10-29 MED ORDER — FENTANYL CITRATE (PF) 250 MCG/5ML IJ SOLN
INTRAMUSCULAR | Status: AC
  Filled 2023-10-29: qty 5

## 2023-10-29 MED ORDER — VANCOMYCIN HCL IN DEXTROSE 1-5 GM/200ML-% IV SOLN
INTRAVENOUS | Status: AC
  Administered 2023-10-29: 1000 mg via INTRAVENOUS
  Filled 2023-10-29: qty 200

## 2023-10-29 MED ORDER — FENTANYL CITRATE (PF) 100 MCG/2ML IJ SOLN: 25.0000 ug | INTRAMUSCULAR | Status: DC | PRN

## 2023-10-29 MED ORDER — SEVELAMER CARBONATE 800 MG PO TABS
800.0000 mg | ORAL_TABLET | Freq: Three times a day (TID) | ORAL | Status: DC
  Administered 2023-10-29 – 2023-10-30 (×3): 800 mg via ORAL
  Filled 2023-10-29 (×3): qty 1

## 2023-10-29 MED ORDER — LIDOCAINE HCL (PF) 1 % IJ SOLN
INTRAMUSCULAR | Status: AC
  Filled 2023-10-29: qty 30

## 2023-10-29 MED ORDER — TAMSULOSIN HCL 0.4 MG PO CAPS
0.4000 mg | ORAL_CAPSULE | Freq: Every day | ORAL | Status: DC
  Administered 2023-10-30: 0.4 mg via ORAL
  Filled 2023-10-29: qty 1

## 2023-10-29 MED ORDER — ORAL CARE MOUTH RINSE: 15.0000 mL | Freq: Once | OROMUCOSAL | Status: AC

## 2023-10-29 MED ORDER — INSULIN ASPART 100 UNIT/ML IJ SOLN: 0.0000 [IU] | INTRAMUSCULAR | Status: DC | PRN

## 2023-10-29 MED ORDER — VANCOMYCIN HCL IN DEXTROSE 1-5 GM/200ML-% IV SOLN
1000.0000 mg | INTRAVENOUS | Status: AC
Start: 2023-10-29 — End: 2023-10-30

## 2023-10-29 MED ORDER — PROPOFOL 1000 MG/100ML IV EMUL
INTRAVENOUS | Status: AC
  Filled 2023-10-29: qty 100

## 2023-10-29 MED ORDER — PHENYLEPHRINE 80 MCG/ML (10ML) SYRINGE FOR IV PUSH (FOR BLOOD PRESSURE SUPPORT)
PREFILLED_SYRINGE | INTRAVENOUS | Status: AC
Start: 2023-10-29 — End: ?
  Filled 2023-10-29: qty 10

## 2023-10-29 MED ORDER — ZOLPIDEM TARTRATE 5 MG PO TABS
5.0000 mg | ORAL_TABLET | Freq: Every evening | ORAL | Status: DC | PRN
  Administered 2023-10-29: 5 mg via ORAL
  Filled 2023-10-29: qty 1

## 2023-10-29 MED ORDER — GABAPENTIN 100 MG PO CAPS
100.0000 mg | ORAL_CAPSULE | Freq: Every day | ORAL | Status: DC
  Administered 2023-10-30: 100 mg via ORAL
  Filled 2023-10-29: qty 1

## 2023-10-29 MED ORDER — MUPIROCIN CALCIUM 2 % EX CREA
TOPICAL_CREAM | Freq: Two times a day (BID) | CUTANEOUS | Status: DC
  Filled 2023-10-29: qty 15

## 2023-10-29 MED ORDER — LIDOCAINE HCL 1 % IJ SOLN
INTRAMUSCULAR | Status: DC | PRN
  Administered 2023-10-29: 12 mL

## 2023-10-29 MED ORDER — ACETAMINOPHEN 325 MG PO TABS: 650.0000 mg | ORAL_TABLET | ORAL | Status: DC | PRN

## 2023-10-29 MED ORDER — SODIUM CHLORIDE 0.9 % IV SOLN: INTRAVENOUS | Status: DC

## 2023-10-29 MED ORDER — SODIUM CHLORIDE 0.9 % IV SOLN: INTRAVENOUS | Status: DC | PRN

## 2023-10-29 MED ORDER — SODIUM CHLORIDE 0.9% FLUSH: 3.0000 mL | INTRAVENOUS | Status: DC | PRN

## 2023-10-29 MED ORDER — ATORVASTATIN CALCIUM 40 MG PO TABS
40.0000 mg | ORAL_TABLET | Freq: Every day | ORAL | Status: DC
  Administered 2023-10-30: 40 mg via ORAL
  Filled 2023-10-29: qty 1

## 2023-10-29 MED ORDER — FENTANYL CITRATE (PF) 250 MCG/5ML IJ SOLN
INTRAMUSCULAR | Status: DC | PRN
  Administered 2023-10-29: 25 ug via INTRAVENOUS

## 2023-10-29 MED ORDER — EPHEDRINE 5 MG/ML INJ
INTRAVENOUS | Status: AC
  Filled 2023-10-29: qty 5

## 2023-10-29 MED ORDER — NOREPINEPHRINE BITARTRATE 1 MG/ML IV SOLN
INTRAVENOUS | Status: DC | PRN
  Administered 2023-10-29: .5 mL via INTRAVENOUS

## 2023-10-29 MED ORDER — ACETAMINOPHEN 500 MG PO TABS
1000.0000 mg | ORAL_TABLET | Freq: Four times a day (QID) | ORAL | Status: AC
  Administered 2023-10-29 – 2023-10-30 (×4): 1000 mg via ORAL
  Filled 2023-10-29 (×4): qty 2

## 2023-10-29 MED ORDER — ESCITALOPRAM OXALATE 10 MG PO TABS
10.0000 mg | ORAL_TABLET | Freq: Every day | ORAL | Status: DC
  Administered 2023-10-30: 10 mg via ORAL
  Filled 2023-10-29: qty 1

## 2023-10-29 MED ORDER — CHLORHEXIDINE GLUCONATE 0.12 % MT SOLN: 15.0000 mL | Freq: Once | OROMUCOSAL | Status: AC

## 2023-10-29 MED ORDER — ROPINIROLE HCL 0.5 MG PO TABS
0.5000 mg | ORAL_TABLET | Freq: Every day | ORAL | Status: DC
  Administered 2023-10-29: 0.5 mg via ORAL
  Filled 2023-10-29 (×2): qty 1

## 2023-10-29 MED ORDER — PROTAMINE SULFATE 10 MG/ML IV SOLN
INTRAVENOUS | Status: AC
  Filled 2023-10-29: qty 25

## 2023-10-29 MED ORDER — OXYCODONE HCL 5 MG PO TABS
5.0000 mg | ORAL_TABLET | Freq: Four times a day (QID) | ORAL | Status: DC | PRN
  Administered 2023-10-29 – 2023-10-30 (×2): 5 mg via ORAL
  Filled 2023-10-29 (×2): qty 1

## 2023-10-29 MED ORDER — ACETAMINOPHEN 650 MG RE SUPP: 650.0000 mg | RECTAL | Status: DC | PRN

## 2023-10-29 MED ORDER — TORSEMIDE 20 MG PO TABS
100.0000 mg | ORAL_TABLET | Freq: Every day | ORAL | Status: DC
  Administered 2023-10-30: 100 mg via ORAL
  Filled 2023-10-29: qty 5

## 2023-10-29 MED ORDER — SODIUM CHLORIDE 0.9% FLUSH
3.0000 mL | Freq: Two times a day (BID) | INTRAVENOUS | Status: DC
  Administered 2023-10-29 – 2023-10-30 (×2): 3 mL via INTRAVENOUS

## 2023-10-29 SURGICAL SUPPLY — 32 items
BLADE CLIPPER SURG (BLADE) ×1 IMPLANT
CANISTER SUCT 3000ML PPV (MISCELLANEOUS) ×1 IMPLANT
COVER SURGICAL LIGHT HANDLE (MISCELLANEOUS) ×1 IMPLANT
DERMABOND ADVANCED .7 DNX12 (GAUZE/BANDAGES/DRESSINGS) ×1 IMPLANT
DRAPE C-ARM 42X72 X-RAY (DRAPES) ×1 IMPLANT
DRAPE LAPAROSCOPIC ABDOMINAL (DRAPES) ×1 IMPLANT
DRSG TEGADERM 4X10 (GAUZE/BANDAGES/DRESSINGS) IMPLANT
ELECT REM PT RETURN 9FT ADLT (ELECTROSURGICAL) ×1
ELECTRODE REM PT RTRN 9FT ADLT (ELECTROSURGICAL) ×1 IMPLANT
GAUZE 4X4 16PLY ~~LOC~~+RFID DBL (SPONGE) ×1 IMPLANT
GLOVE BIO SURGEON STRL SZ7.5 (GLOVE) ×2 IMPLANT
GOWN STRL REUS W/ TWL LRG LVL3 (GOWN DISPOSABLE) ×2 IMPLANT
KIT BASIN OR (CUSTOM PROCEDURE TRAY) ×1 IMPLANT
KIT PLEURX DRAIN CATH 1000ML (MISCELLANEOUS) ×1 IMPLANT
KIT PLEURX DRAIN CATH 15.5FR (DRAIN) ×1 IMPLANT
KIT TURNOVER KIT B (KITS) ×1 IMPLANT
NDL HYPO 25GX1X1/2 BEV (NEEDLE) ×1 IMPLANT
NEEDLE HYPO 25GX1X1/2 BEV (NEEDLE) ×1 IMPLANT
NS IRRIG 1000ML POUR BTL (IV SOLUTION) ×1 IMPLANT
PACK GENERAL/GYN (CUSTOM PROCEDURE TRAY) ×1 IMPLANT
PAD ARMBOARD 7.5X6 YLW CONV (MISCELLANEOUS) ×2 IMPLANT
SET DRAINAGE LINE (MISCELLANEOUS) IMPLANT
SPONGE T-LAP 18X18 ~~LOC~~+RFID (SPONGE) ×4 IMPLANT
SPONGE T-LAP 4X18 ~~LOC~~+RFID (SPONGE) ×1 IMPLANT
SUT ETHILON 3 0 PS 1 (SUTURE) ×1 IMPLANT
SUT SILK 2 0 SH (SUTURE) ×1 IMPLANT
SUT VIC AB 3-0 SH 8-18 (SUTURE) ×1 IMPLANT
SYR CONTROL 10ML LL (SYRINGE) ×1 IMPLANT
TOWEL GREEN STERILE (TOWEL DISPOSABLE) ×1 IMPLANT
TOWEL GREEN STERILE FF (TOWEL DISPOSABLE) ×1 IMPLANT
VALVE REPLACEMENT CAP (MISCELLANEOUS) IMPLANT
WATER STERILE IRR 1000ML POUR (IV SOLUTION) ×1 IMPLANT

## 2023-10-29 NOTE — Progress Notes (Signed)
M.D. patient takes Ofloxacin 0.3% bid one drop left eye at home for pink eye. Can you please order it for him. HIs left eye is pink.

## 2023-10-29 NOTE — H&P (Signed)
HPI: Patient examined, images of most recent echocardiogram and chest x-ray personally reviewed and counseled patient.  72 year old male status post CABG x 4 about 5 years ago presents with end-stage renal failure, heart failure, EF 30% with MR, TR and pulmonary hypertension and recurrent symptomatic right pleural effusion.  The patient has had right thoracentesis for symptomatic pleural effusion requiring hospitalization twice at [facility].  He is starting to have recurrent symptoms of shortness of breath and fullness despite full dialysis.  Chest x-ray today shows reaccumulating right pleural effusion, status post sternotomy and CABG.  The patient has recently been placed on home oxygen 2 to 3 L/min to assist with his shortness of breath from recurrent pleural effusion and heart failure.  The patient is in sinus rhythm and is on no anticoagulation other than aspirin.  He is a type II diabetic and is allergic penicillin.  He has had no previous surgery or trauma to the right chest.  He has right arm AV fistula for HD access  Current Facility-Administered Medications  Medication Dose Route Frequency Provider Last Rate Last Admin   0.9 %  sodium chloride infusion   Intravenous Continuous Gaynelle Adu, MD       chlorhexidine (PERIDEX) 0.12 % solution 15 mL  15 mL Mouth/Throat Once Gaynelle Adu, MD       Or   Oral care mouth rinse  15 mL Mouth Rinse Once Gaynelle Adu, MD       chlorhexidine (PERIDEX) 0.12 % solution            vancomycin (VANCOCIN) 1-5 GM/200ML-% IVPB            vancomycin (VANCOCIN) IVPB 1000 mg/200 mL premix  1,000 mg Intravenous On Call to OR Loreli Slot, MD         Physical Exam:Blood pressure 134/73, pulse 69, resp. rate 18, height 6\' 2"  (1.88 m), weight 169 lb (76.7 kg), SpO2 98%.        Physical Exam  General: Frail-appearing 72 year old man uses a cane for ambulation. HEENT: Normocephalic pupils equal , dentition  adequate Neck: Supple with moderate JVD but without adenopathy, or bruit Chest: Well-healed sternotomy incision.  Regular rhythm.  Grade 2/6 systolic murmur Diminished breath sounds on the right, normal breath sounds on the left Cardiovascular: Regular rate and rhythm, no murmur, no gallop, peripheral pulses 1+ in lower extremities             Abdomen:  Soft, nontender, no palpable mass or organomegaly Extremities: Warm, well-perfused, no clubbing cyanosis or tenderness, 2+ pedal edema bilaterally               Rectal/GU: Deferred Neuro: Grossly non--focal and symmetrical throughout Skin: Clean and dry without rash or ulceration  Diagnostic Tests: Today's chest x-ray shows moderate right pleural effusion Right heart cath 6 months ago shows cardiac index 3.4 L/min, CVP 13, PA pressure 65/24, PA saturation 70%, pulmonary wedge pressure 27   Impression: Symptomatic recurrent right pleural effusion with underlying ischemic cardiomyopathy and pulmonary hypertension with moderate TR, mild AS moderate MR  End-stage renal disease on hemodialysis schedule Tuesday Thursday Saturday  Type 2 diabetes mellitus Status post CABG x 4  Patient is a DNR status but understands during the procedure he will potentially need cardiac medications and assistance with breathing and DNR will be rescinded.  Patient has a significant penicillin allergy and will need vancomycin before the procedure  Plan: Patient scheduled for right Pleurx catheter placement in OR under [facility]  anesthesia on December 6.  Due to his frail status we will plan admission overnight for observation and hemodialysis on his regular schedule,, Saturday.  Clinical Update 10-29-23  Pre Procedure note   OSBON SVAY has been scheduled for Procedure(s): INSERTION PLEURAL DRAINAGE CATHETER (Right) today. The various methods of treatment have been discussed with the patient. After consideration of the risks, benefits and treatment options  the patient has consented to the planned procedure.   The patient has been seen and labs reviewed. There are no changes in the patient's condition from the initial consult to prevent proceeding with the planned procedure today.  Recent labs:  Lab Results  Component Value Date   WBC 5.0 03/17/2023   HGB 10.8 (L) 03/17/2023   HCT 32.2 (L) 03/17/2023   PLT 149.0 (L) 03/17/2023   GLUCOSE 157 (H) 03/17/2023   CHOL 86 (L) 01/02/2020   TRIG 41 01/02/2020   HDL 50 01/02/2020   LDLCALC 24 01/02/2020   ALT 21 03/17/2023   AST 19 03/17/2023   NA 137 03/17/2023   K 3.6 03/17/2023   CL 92 (L) 03/17/2023   CREATININE 6.08 (HH) 03/17/2023   BUN 17 03/17/2023   CO2 33 (H) 03/17/2023   INR 1.1 02/15/2020   HGBA1C QNSTST 02/21/2023    Lovett Sox, MD 10/29/2023 9:58 AM

## 2023-10-29 NOTE — Op Note (Unsigned)
NAME: John [name], John [name] MEDICAL RECORD NO: [id number] ACCOUNT NO: [id number] DATE OF BIRTH: May 22, 1951 FACILITY: [facility] LOCATION: [id number] PHYSICIAN: Kerin Perna III, MD  Operative Report   DATE OF PROCEDURE: 10/29/2023  OPERATION:  Placement of right PleurX catheter and drainage of right pleural effusion.  SURGEON:  Kerin Perna III, MD  PREOPERATIVE DIAGNOSES: 1.  History of heart failure with recurrent right pleural effusion. 2.  Requiring thoracentesis x2.  POSTOPERATIVE DIAGNOSES: 1.  History of heart failure with recurrent right pleural effusion. 2.  Requiring thoracentesis x2.  ANESTHESIA:  MAC with 1% local lidocaine and monitored IV conscious sedation.  CLINICAL NOTE:  The patient presented for right PleurX catheter for refractory recurrent right pleural effusion, which has been symptomatic and has required thoracentesis twice.  I discussed the patient with his cardiologist, Dr. Augustina Mood, and agreed  with the procedure despite his frail status for coordination of care.  The patient was examined and the history and physical updated in the preoperative area and informed consent was documented and the proper site of surgery was marked.  The patient was  escorted back to the OR by anesthesia and placed supine on the operating room table.  He was positioned and then started on IV conscious sedation.  DESCRIPTION OF PROCEDURE:  The right chest and abdomen were prepped and draped as a sterile field.  A proper timeout was performed.  1% lidocaine was infiltrated in the anterior axillary line at the sixth interspace and then 4-5 cm below that for the  PleurX catheter tract.  Two small incisions were made.  Through the upper incision, a 25-gauge needle was used to confirm the presence of the pleural effusion.  A larger needle sheath system in the kit was then placed after assuring there was adequate  local anesthesia and serosanguineous fluid was removed.  Through the sheath, a  guidewire was passed and confirmed to be in the pleural space by C-arm fluoroscopy.  Next, the PleurX catheter was tunneled from the lower incision to the upper incision and  then cut to the appropriate length.  Through the sheath, a wire had been placed and then over the wire, a dilator, and then a dilator with a sheath was inserted into the pleural space.  The tear-away sheath was removed as the PleurX catheter was advanced  in the pleural space.  There was drainage of serosanguineous fluid and after we drained the pleural space, there was almost 2 liters of fluid that had been removed.  The upper incision was closed with interrupted Vicryl for the subcutaneous layer and  interrupted nylon for the skin.  The lower incision where the catheter exited was closed with a nylon, which was used to secure the catheter site as well.  The sterile dressings in the kit were then placed and covered with Tegaderm.  The patient then  returned to the recovery room for further monitoring and for a chest x-ray.   PUS D: 10/29/2023 2:14:16 pm T: 10/29/2023 7:07:00 pm  JOB: [id number]/ [id number]

## 2023-10-29 NOTE — Brief Op Note (Signed)
10/29/2023  1:17 PM  PATIENT:  John [name]  72 y.o. male  PRE-OPERATIVE DIAGNOSIS:  RIGHT PLEURAL EFFUSION  POST-OPERATIVE DIAGNOSIS:  RIGHT PLEURAL EFFUSION  PROCEDURE:  Procedure(s): INSERTION PLEURAL DRAINAGE CATHETER (Right) Drainage of Right Pleural effusion 1.8 Liters  SURGEON:  Surgeons and Role:    Lovett Sox, MD - Primary  PHYSICIAN ASSISTANT: na  ASSISTANTS: RN   ANESTHESIA:   MAC  EBL:  5 ml  BLOOD ADMINISTERED:none  DRAINS:  Right Pleurx drain    LOCAL MEDICATIONS USED:  LIDOCAINE  and Amount: 14 ml   SPECIMEN:  No Specimen  DISPOSITION OF SPECIMEN:  N/A  COUNTS:  YES  TOURNIQUET:  * No tourniquets in log *  DICTATION: .Dragon Dictation  PLAN OF CARE: Admit for overnight observation  PATIENT DISPOSITION:  PACU - hemodynamically stable.   Delay start of Pharmacological VTE agent (>24hrs) due to surgical blood loss or risk of bleeding: not applicable

## 2023-10-29 NOTE — Anesthesia Postprocedure Evaluation (Signed)
Anesthesia Post Note  Patient: John [name]  Procedure(s) Performed: INSERTION PLEURAL DRAINAGE CATHETER (Right: Chest)     Patient location during evaluation: PACU Anesthesia Type: MAC Level of consciousness: awake and alert Pain management: pain level controlled Vital Signs Assessment: post-procedure vital signs reviewed and stable Respiratory status: spontaneous breathing, nonlabored ventilation and respiratory function stable Cardiovascular status: stable and blood pressure returned to baseline Postop Assessment: no apparent nausea or vomiting Anesthetic complications: no  No notable events documented.  Last Vitals:  Vitals:   10/29/23 1318 10/29/23 1330  BP: 114/64 109/69  Pulse:  81  Resp: 15 12  Temp: 36.9 C   SpO2:  94%    Last Pain:  Vitals:   10/29/23 1318  TempSrc:   PainSc: 0-No pain                 Rachelanne Whidby,W. EDMOND

## 2023-10-29 NOTE — Transfer of Care (Signed)
Immediate Anesthesia Transfer of Care Note  Patient: John [name]  Procedure(s) Performed: INSERTION PLEURAL DRAINAGE CATHETER (Right: Chest)  Patient Location: PACU  Anesthesia Type:MAC  Level of Consciousness: awake, alert , and oriented  Airway & Oxygen Therapy: Patient Spontanous Breathing  Post-op Assessment: Report given to RN and Post -op Vital signs reviewed and stable  Post vital signs: Reviewed and stable  Last Vitals:  Vitals Value Taken Time  BP 114/64 10/29/23 1317  Temp 98   Pulse 84 10/29/23 1326  Resp 20 10/29/23 1326  SpO2 94 % 10/29/23 1326  Vitals shown include unfiled device data.  Last Pain:  Vitals:   10/29/23 1014  TempSrc:   PainSc: 0-No pain         Complications: No notable events documented.

## 2023-10-29 NOTE — Consult Note (Addendum)
[facility] Renal Consultation Note    Indication for Consultation:  Management of ESRD/hemodialysis; anemia, hypertension/volume and secondary hyperparathyroidism  WGN:FAOZH, Orie Rout, NP  HPI: John [name] is a 72 y.o. male with ESRD on HD TTS at [facility]. His past medical history significant for recurrent symptomatic right pleural effusions, s/p CABG X 4, CHF with EF 30%, TR, and pulmonary HTN.  Noted patient has been dealing with recurrent R-sided pleural effusions requiring multiple hospitalizations and needing thoracentesis at [facility]. Patient presents here by Cardiothoracic Surgery for ongoing shortness of breath despite receiving full dialysis at his outpatient hemodialysis center. He underwent R-sided pleural drainage catheter by Dr. Maren Beach today. He received full hemodialysis treatment on 10/28/23. Seen and examined patient in the PACU. He appears comfortable and currently denies SOB, CP, and N/V. Current CMP acceptable. Plan for HD tomorrow morning first case to resume his routine schedule. Noted patient is currently a DNR-limited.  Past Medical History:  Diagnosis Date   Allergy, unspecified, initial encounter 08/30/2020   Anaphylactic shock, unspecified, initial encounter 08/30/2020   Anemia in chronic kidney disease 08/30/2020   Anxiety    Arthritis    Back pain 09/07/2017   Carotid artery stenosis    40-59% LICA, 1-39% RICA 03/27/19 [place]   Chest pain 03/24/2019   Chronic infective endocarditis    Chronic kidney disease (CKD), stage IV (severe) (HCC) 12/01/2018   CKD (chronic kidney disease), stage V (HCC) 03/24/2019   Coronary artery disease    Dependence on renal dialysis (HCC) 08/31/2020   Depression    Diabetes mellitus without complication (HCC)    Disorder of phosphorus metabolism, unspecified 08/31/2020   DM (diabetes mellitus), type 2 with renal complications (HCC) 09/07/2017   Dyspnea    Encounter for screening for  respiratory tuberculosis 08/30/2020   End stage renal disease (HCC) 09/07/2017   ESRD (end stage renal disease) (HCC) 08/27/2020   Essential (primary) hypertension 09/07/2017   Fever    History of endocarditis 01/09/2020   Hypercalcemia 10/08/2020   Hyperlipidemia, unspecified 09/07/2017   Hypertension    Hypokalemia 11/13/2021   Iron deficiency anemia, unspecified 08/30/2020   Low back pain 09/07/2017   MI (myocardial infarction) (HCC)    MSSA bacteremia 09/10/2017   Non-ST elevation (NSTEMI) myocardial infarction (HCC) 01/09/2020   NSTEMI (non-ST elevated myocardial infarction) (HCC) 03/24/2019   Other specified arthritis, other site 08/31/2020   Pain, unspecified 08/30/2020   Personal history of other diseases of the circulatory system 08/31/2020   Presence of aortocoronary bypass graft 08/31/2020   Pruritus, unspecified 08/30/2020   Renal disorder    Restless legs syndrome 09/06/2020   S/P CABG x 4 04/03/2019   S/P Video Bronchoscopy, Right VATS with drainage of pleural effusion, decortication, intercostal nerve block 02/20/2020   Secondary hyperparathyroidism of renal origin (HCC) 08/30/2020   Sleep apnea    Thoracic aortic aneurysm (TAA) (HCC)    4.3 cm ascending TAA 02/02/20 CT   Type 2 diabetes mellitus with diabetic nephropathy (HCC) 08/30/2020   Past Surgical History:  Procedure Laterality Date   ABDOMINAL AORTIC ANEURYSM REPAIR     APPENDECTOMY     AV FISTULA PLACEMENT Right 10/18/2018   Procedure: BRACHIO-CEPHALIC ARTERIOVENOUS (AV) FISTULA CREATION;  Surgeon: Chuck Hint, MD;  Location: [facility] OR;  Service: Vascular;  Laterality: Right;   CARDIAC CATHETERIZATION     CORONARY ANGIOPLASTY     CORONARY ARTERY BYPASS GRAFT N/A 03/29/2019   Procedure: CORONARY ARTERY BYPASS GRAFTING (CABG)  x 4, using left internal mammary artery and right leg greater saphenous vein harvested endoscopically;  Surgeon: Loreli Slot, MD;  Location: [facility] OR;  Service: Open Heart  Surgery;  Laterality: N/A;   CORONARY PRESSURE/FFR WITH 3D MAPPING N/A 03/27/2019   Procedure: Coronary Pressure Wire/FFR w/3D Mapping;  Surgeon: Corky Crafts, MD;  Location: [facility] INVASIVE CV LAB;  Service: Cardiovascular;  Laterality: N/A;   DECORTICATION Left 02/19/2020   Procedure: DECORTICATION;  Surgeon: Loreli Slot, MD;  Location: [facility] OR;  Service: Thoracic;  Laterality: Left;   INTERCOSTAL NERVE BLOCK Left 02/19/2020   Procedure: Intercostal Nerve Block;  Surgeon: Loreli Slot, MD;  Location: [facility] OR;  Service: Thoracic;  Laterality: Left;   IR FLUORO GUIDE CV LINE RIGHT  09/13/2017   IR REMOVAL TUN CV CATH W/O FL  10/22/2017   IR US GUIDE VASC ACCESS RIGHT  09/13/2017   LEFT HEART CATH AND CORONARY ANGIOGRAPHY N/A 03/27/2019   Procedure: LEFT HEART CATH AND CORONARY ANGIOGRAPHY;  Surgeon: Corky Crafts, MD;  Location: [facility] INVASIVE CV LAB;  Service: Cardiovascular;  Laterality: N/A;   NECK SURGERY     PLEURAL EFFUSION DRAINAGE Left 02/19/2020   Procedure: DRAINAGE OF PLEURAL EFFUSION;  Surgeon: Loreli Slot, MD;  Location: [facility] OR;  Service: Thoracic;  Laterality: Left;   RIGHT HEART CATH AND CORONARY/GRAFT ANGIOGRAPHY N/A 02/09/2023   Procedure: RIGHT HEART CATH AND CORONARY/GRAFT ANGIOGRAPHY;  Surgeon: Dolores Patty, MD;  Location: [facility] INVASIVE CV LAB;  Service: Cardiovascular;  Laterality: N/A;   SMALL INTESTINE SURGERY     TEE WITHOUT CARDIOVERSION N/A 09/10/2017   Procedure: TRANSESOPHAGEAL ECHOCARDIOGRAM (TEE);  Surgeon: Chilton Si, MD;  Location: [facility] ENDOSCOPY;  Service: Cardiovascular;  Laterality: N/A;   TEE WITHOUT CARDIOVERSION N/A 03/29/2019   Procedure: TRANSESOPHAGEAL ECHOCARDIOGRAM (TEE);  Surgeon: Loreli Slot, MD;  Location: [facility] OR;  Service: Open Heart Surgery;  Laterality: N/A;   TEE WITHOUT CARDIOVERSION N/A 01/12/2020   Procedure: TRANSESOPHAGEAL ECHOCARDIOGRAM (TEE);  Surgeon: Thurmon Fair, MD;  Location: [facility] ENDOSCOPY;   Service: Cardiovascular;  Laterality: N/A;   TEE WITHOUT CARDIOVERSION N/A 02/09/2023   Procedure: TRANSESOPHAGEAL ECHOCARDIOGRAM (TEE);  Surgeon: Dolores Patty, MD;  Location: [facility] ENDOSCOPY;  Service: Cardiovascular;  Laterality: N/A;   VIDEO ASSISTED THORACOSCOPY Left 02/19/2020   Procedure: VIDEO ASSISTED THORACOSCOPY;  Surgeon: Loreli Slot, MD;  Location: [facility] OR;  Service: Thoracic;  Laterality: Left;   VIDEO BRONCHOSCOPY N/A 02/19/2020   Procedure: VIDEO BRONCHOSCOPY;  Surgeon: Loreli Slot, MD;  Location: [facility] OR;  Service: Thoracic;  Laterality: N/A;   Family History  Problem Relation Age of Onset   Cancer Maternal Grandmother    Brain cancer Maternal Grandmother    Colon cancer Neg Hx    Rectal cancer Neg Hx    Stomach cancer Neg Hx    Social History:  reports that he quit smoking about 26 years ago. His smoking use included cigarettes. He started smoking about 56 years ago. He has a 60 pack-year smoking history. He has never used smokeless tobacco. He reports current alcohol use. He reports that he does not use drugs. Allergies  Allergen Reactions   Penicillins Rash and Other (See Comments)    Did it involve swelling of the face/tongue/throat, SOB, or low BP? No Did it involve sudden or severe rash/hives, skin peeling, or any reaction on the inside of your mouth or nose? Yes Did you need to seek medical attention at a hospital or doctor's  office? Yes When did it last happen?      Childhood allergy If all above answers are "NO", may proceed with cephalosporin use.    Prior to Admission medications   Medication Sig Start Date End Date Taking? Authorizing Provider  aspirin EC 81 MG tablet Take 81 mg by mouth daily.   Yes [provider]  atorvastatin (LIPITOR) 40 MG tablet Take 40 mg by mouth at bedtime.   Yes [provider]  b complex-vitamin c-folic acid (NEPHRO-VITE) 0.8 MG TABS tablet Take 1 tablet by mouth at bedtime.   Yes [provider]  escitalopram (LEXAPRO) 10 MG tablet Take 10 mg by mouth daily.   Yes [provider]  gabapentin (NEURONTIN) 100 MG capsule Take 2 capsules (200 mg total) by mouth at bedtime. 04/04/19  Yes Barrett, Erin R, PA-C  ibuprofen (ADVIL) 200 MG tablet Take 200 mg by mouth daily as needed for moderate pain.   Yes [provider]  ketoconazole (NIZORAL) 2 % shampoo Apply 1 Application topically 3 (three) times a week. 02/18/23  Yes [provider]  midodrine (PROAMATINE) 10 MG tablet 3 TIMES A DAY ON TUESDAY, THURSDAY AND SATURDAY 10/06/23  Yes Bensimhon, Bevelyn Buckles, MD  pantoprazole (PROTONIX) 40 MG tablet TAKE 1 TABLET BY MOUTH EVERY DAY 09/07/23  Yes Byrum, Les Pou, MD  RENVELA 800 MG tablet Take 1,600 mg by mouth 3 (three) times daily with meals. 01/07/23  Yes [provider]  SENSIPAR 30 MG tablet Take 30 mg by mouth daily with supper. 09/11/21  Yes [provider]  tamsulosin (FLOMAX) 0.4 MG CAPS capsule Take 0.4 mg by mouth daily. 08/19/20  Yes [provider]  torsemide (DEMADEX) 100 MG tablet Take 150 mg by mouth daily. 12/02/22  Yes [provider]  zolpidem (AMBIEN) 10 MG tablet Take 10 mg by mouth at bedtime.   Yes [provider]  acetaminophen (TYLENOL) 325 MG tablet Take 650 mg by mouth every 6 (six) hours as needed for moderate pain.    [provider]  clobetasol (TEMOVATE) 0.05 % external solution Apply 1 Application topically 2 (two) times daily. 02/18/23   [provider]  fluticasone (FLONASE) 50 MCG/ACT nasal spray Place 1 spray into both nostrils daily as needed for allergies or rhinitis.    [provider]  mupirocin ointment (BACTROBAN) 2 % Apply 1 Application topically 3 (three) times daily. 02/15/23   [provider]  Nutritional Supplements (,FEEDING SUPPLEMENT, PROSOURCE PLUS) liquid Take 30 mLs by mouth 2 (two) times daily between meals. Patient not taking:  Reported on 10/28/2023 02/25/23   Lorin Glass, MD  OVER THE COUNTER MEDICATION Place 1 drop into both eyes 2 (two) times daily as needed (dry eyes). Hyper Tears eye drops/Unknown strength    [provider]  rOPINIRole (REQUIP) 0.5 MG tablet Take 0.5 mg by mouth See admin instructions. Take 0.5 at bedtime, take 0.5 mg before dialysis (Tues, Thurs, and Sat)    [provider]   Current Facility-Administered Medications  Medication Dose Route Frequency Provider Last Rate Last Admin   0.9 %  sodium chloride infusion   Intravenous Continuous Gaynelle Adu, MD 10 mL/hr at 10/29/23 1016 New Bag at 10/29/23 1016   fentaNYL (SUBLIMAZE) injection 25-50 mcg  25-50 mcg Intravenous Q5 min PRN Gaynelle Adu, MD       insulin aspart (novoLOG) injection 0-7 Units  0-7 Units Subcutaneous Q2H PRN Gaynelle Adu, MD  Labs: Basic Metabolic Panel: Recent Labs  Lab 10/29/23 0932  NA 135  K 3.3*  CL 93*  CO2 27  GLUCOSE 94  BUN 12  CREATININE 3.74*  CALCIUM 10.0   Liver Function Tests: Recent Labs  Lab 10/29/23 0932  AST 15  ALT 13  ALKPHOS 114  BILITOT 2.0*  PROT 6.3*  ALBUMIN 2.9*   No results for input(s): "LIPASE", "AMYLASE" in the last 168 hours. No results for input(s): "AMMONIA" in the last 168 hours. CBC: Recent Labs  Lab 10/29/23 0932  WBC 5.0  HGB 12.3*  HCT 39.6  MCV 97.1  PLT 95*   Cardiac Enzymes: No results for input(s): "CKTOTAL", "CKMB", "CKMBINDEX", "TROPONINI" in the last 168 hours. CBG: Recent Labs  Lab 10/29/23 0936 10/29/23 1138 10/29/23 1338  GLUCAP 86 76 82   Iron Studies: No results for input(s): "IRON", "TIBC", "TRANSFERRIN", "FERRITIN" in the last 72 hours. Studies/Results: DG C-Arm 1-60 Min-No Report  Result Date: 10/29/2023 Fluoroscopy was utilized by the requesting physician.  No radiographic interpretation.    ROS: All others negative except those listed in HPI.   Physical Exam: Vitals:   10/29/23 1330  10/29/23 1345 10/29/23 1400 10/29/23 1415  BP: 109/69 105/61 (!) 103/57 (!) 100/59  Pulse: 81 79 77 80  Resp: 12 13 16 16   Temp:      TempSrc:      SpO2: 94% 93% 94% 93%  Weight:      Height:         General: Older male; frail-appearing; NAD; on RA Head: Sclera not icteric  Lungs: Clear anteriorly and diminished laterally. No wheeze, rales or rhonchi. Breathing is unlabored. Heart: RRR. No murmur, rubs or gallops.  Abdomen: Soft and non-tender Lower extremities: No LE edema Neuro: AAOx3. Moves all extremities spontaneously. Dialysis Access: R AVF (+) B/T  Dialysis Orders:  TTS - [facility] 4hrs, BFR 400, DFR Auto 1.5,  EDW 75kg, 2K/ 2Ca Heparin: None Mircera: None Calcitriol 0.82mcg PO qHD -last 12/5  Last Labs: Hgb 12.3, K 3.3, Ca 10, Alb 2.9  Assessment/Plan: Recurrent R-sided pleural effusions - Followed by Cardiothoracic Surgery, s/p R pleural drainage catheter today by Dr. Maren Beach ESRD - on HD TTS, plan for HD tomorrow morning first case to resume his routine schedule. K+ 3.3, will use 3K bath. Hypertension/volume  - Euvolemic on exam and Bps currently soft but stable. Anemia of CKD - Hgb 12.3. No ESA/Fe is indicated at this time. Secondary Hyperparathyroidism -  Will check phos in AM, Corr Ca elevated, hold VDRA for now Nutrition - Renal diet with fluid restriction  Salome Holmes, NP [facility] 10/29/2023, 2:52 PM

## 2023-10-29 NOTE — Progress Notes (Signed)
CT surgery postop check-drainage of right pleural effusion   Blood pressure (!) 100/59, pulse 80, temperature 98.5 F (36.9 C), resp. rate 16, height 6\' 2"  (1.88 m), weight 76.7 kg, SpO2 93%.   Patient breathing comfortably with saturation 93% on room air in the PACU.  Breath sounds on the right side are minimally changed from preoperative exam .  After removing 1.8 L of serosanguineous right pleural effusion the postop portable chest x-ray shows the fluid has been evacuated but the right lower lobe is entrapped from an extended period of collapse leaving a significant subpulmonic space from which the fluid was evacuated.  The Pleurx catheter is in a good, dependent good position and can be used to remove any reaccumulation.  Hopefully over time the right lower lobe can reexpand.  The findings do not correlate with a pneumothorax as the catheter insertion went without difficulty or suction of air.  The plan is to apply suction to the Pleurx catheter at home on a Monday Wednesday Friday schedule.  To help the lung reexpand we will keep the patient on nasal cannula and encourage incentive spirometry.  Follow-up chest x-ray is ordered tomorrow.  The patient was CODE STATUS DO NOT RESUSCITATE preoperatively which was rescinded for the procedure.  The patient and family wish to be DNR CODE STATUS to be reactivated and I feel that is appropriate and the changes been made.  Patient has been seen by renal and is scheduled for his usual Saturday a.m. HD as an inpatient and should be ready for discharge later tomorrow.  The patient has an appointment to return to see me in clinic with chest x-ray in 1 week.

## 2023-10-30 ENCOUNTER — Encounter (HOSPITAL_COMMUNITY): Payer: Self-pay | Admitting: Cardiothoracic Surgery

## 2023-10-30 DIAGNOSIS — J90 Pleural effusion, not elsewhere classified: Secondary | ICD-10-CM | POA: Diagnosis not present

## 2023-10-30 LAB — HEPATITIS B SURFACE ANTIBODY, QUANTITATIVE: Hep B S AB Quant (Post): 27.9 m[IU]/mL

## 2023-10-30 LAB — BASIC METABOLIC PANEL
Anion gap: 13 (ref 5–15)
BUN: 16 mg/dL (ref 8–23)
CO2: 28 mmol/L (ref 22–32)
Calcium: 9.4 mg/dL (ref 8.9–10.3)
Chloride: 92 mmol/L — ABNORMAL LOW (ref 98–111)
Creatinine, Ser: 4.43 mg/dL — ABNORMAL HIGH (ref 0.61–1.24)
GFR, Estimated: 14 mL/min — ABNORMAL LOW (ref >60)
Glucose, Bld: 110 mg/dL — ABNORMAL HIGH (ref 70–99)
Potassium: 3.6 mmol/L (ref 3.5–5.1)
Sodium: 133 mmol/L — ABNORMAL LOW (ref 135–145)

## 2023-10-30 LAB — CBC
HCT: 34.2 % — ABNORMAL LOW (ref 39.0–52.0)
Hemoglobin: 11.1 g/dL — ABNORMAL LOW (ref 13.0–17.0)
MCH: 30.7 pg (ref 26.0–34.0)
MCHC: 32.5 g/dL (ref 30.0–36.0)
MCV: 94.7 fL (ref 80.0–100.0)
Platelets: 91 10*3/uL — ABNORMAL LOW (ref 150–400)
RBC: 3.61 MIL/uL — ABNORMAL LOW (ref 4.22–5.81)
RDW: 16.7 % — ABNORMAL HIGH (ref 11.5–15.5)
WBC: 4.3 10*3/uL (ref 4.0–10.5)
nRBC: 0 % (ref 0.0–0.2)

## 2023-10-30 MED ORDER — LIDOCAINE-PRILOCAINE 2.5-2.5 % EX CREA: 1.0000 | TOPICAL_CREAM | CUTANEOUS | Status: DC | PRN

## 2023-10-30 MED ORDER — PENTAFLUOROPROP-TETRAFLUOROETH EX AERO: 1.0000 | INHALATION_SPRAY | CUTANEOUS | Status: DC | PRN

## 2023-10-30 MED ORDER — OXYCODONE HCL 5 MG PO TABS: 5.0000 mg | ORAL_TABLET | Freq: Four times a day (QID) | ORAL | 0 refills | Status: AC | PRN

## 2023-10-30 MED ORDER — MUPIROCIN 2 % EX OINT
1.0000 | TOPICAL_OINTMENT | Freq: Two times a day (BID) | CUTANEOUS | Status: DC
  Administered 2023-10-30: 1 via NASAL
  Filled 2023-10-30: qty 22

## 2023-10-30 MED ORDER — ALTEPLASE 2 MG IJ SOLR: 2.0000 mg | Freq: Once | INTRAMUSCULAR | Status: DC | PRN

## 2023-10-30 MED ORDER — CHLORHEXIDINE GLUCONATE CLOTH 2 % EX PADS
6.0000 | MEDICATED_PAD | Freq: Every day | CUTANEOUS | Status: DC
  Administered 2023-10-30: 6 via TOPICAL

## 2023-10-30 MED ORDER — HEPARIN SODIUM (PORCINE) 1000 UNIT/ML DIALYSIS: 1000.0000 [IU] | INTRAMUSCULAR | Status: DC | PRN

## 2023-10-30 MED ORDER — LIDOCAINE HCL (PF) 1 % IJ SOLN: 5.0000 mL | INTRAMUSCULAR | Status: DC | PRN

## 2023-10-30 NOTE — Care Management CC44 (Signed)
Condition Code 44 Documentation Completed  Patient Details  Name: John [name] MRN: [id number] Date of Birth: Aug 20, 1951   Condition Code 44 given:  Yes Patient signature on Condition Code 44 notice:  Yes Documentation of 2 MD's agreement:  Yes Code 44 added to claim:  Yes    Ronny Bacon, RN 10/30/2023, 9:52 AM

## 2023-10-30 NOTE — Progress Notes (Signed)
1 Day Post-Op Procedure(s) (LRB): INSERTION PLEURAL DRAINAGE CATHETER (Right) Subjective: Didn't sleep well but no new c/o  Objective: Vital signs in last 24 hours: Temp:  [97.7 F (36.5 C)-98.5 F (36.9 C)] 98 F (36.7 C) (12/07 0250) Pulse Rate:  [70-87] 74 (12/07 0825) Cardiac Rhythm: Normal sinus rhythm;Heart block (12/07 0711) Resp:  [12-20] 16 (12/07 0825) BP: (95-114)/(57-76) 112/61 (12/07 0825) SpO2:  [92 %-98 %] 92 % (12/07 0825) Weight:  [74.7 kg-76.7 kg] 74.7 kg (12/07 0559)  Hemodynamic parameters for last 24 hours:    Intake/Output from previous day: 12/06 0701 - 12/07 0700 In: 580 [P.O.:480; I.V.:100] Out: 1500  Intake/Output this shift: No intake/output data recorded.  General appearance: alert, cooperative, fatigued, and no distress Heart: regular rate and rhythm Lungs: clear to auscultation bilaterally Abdomen: benign Extremities: no edema  Lab Results: Recent Labs    10/29/23 0932 10/30/23 0243  WBC 5.0 4.3  HGB 12.3* 11.1*  HCT 39.6 34.2*  PLT 95* 91*   BMET:  Recent Labs    10/29/23 0932 10/30/23 0243  NA 135 133*  K 3.3* 3.6  CL 93* 92*  CO2 27 28  GLUCOSE 94 110*  BUN 12 16  CREATININE 3.74* 4.43*  CALCIUM 10.0 9.4    PT/INR:  Recent Labs    10/29/23 1045  LABPROT 15.2  INR 1.2   ABG    Component Value Date/Time   PHART 7.465 (H) 02/09/2023 1005   HCO3 30.6 (H) 02/09/2023 1012   TCO2 32 02/09/2023 1012   ACIDBASEDEF 7.2 (H) 02/19/2020 1843   O2SAT 71 02/09/2023 1012   CBG (last 3)  Recent Labs    10/29/23 0936 10/29/23 1138 10/29/23 1338  GLUCAP 86 76 82    Meds Scheduled Meds:  acetaminophen  1,000 mg Oral Q6H   aspirin EC  81 mg Oral Daily   atorvastatin  40 mg Oral Daily   Chlorhexidine Gluconate Cloth  6 each Topical Q0600   cinacalcet  30 mg Oral Q supper   escitalopram  10 mg Oral Daily   gabapentin  100 mg Oral Daily   midodrine  10 mg Oral Daily   multivitamin  1 tablet Oral QHS   mupirocin  ointment  1 Application Nasal BID   pantoprazole  40 mg Oral Q1200   rOPINIRole  0.5 mg Oral QHS   sevelamer carbonate  800 mg Oral TID WC   sodium chloride flush  3 mL Intravenous Q12H   tamsulosin  0.4 mg Oral Daily   torsemide  100 mg Oral Daily   Continuous Infusions:  sodium chloride     PRN Meds:.sodium chloride, acetaminophen **OR** acetaminophen, oxyCODONE, sodium chloride flush, zolpidem  Xrays DG Chest Port 1 View  Result Date: 10/29/2023 CLINICAL DATA:  Pleural drainage catheter placement for pleural effusion. EXAM: PORTABLE CHEST 1 VIEW COMPARISON:  Radiographs 10/25/2023 and 10/13/2023.  CT 09/29/2023. FINDINGS: 1412 hours. PleurX catheter has been placed inferiorly in the right pleural space with near complete evacuation of the previously demonstrated right pleural effusion. Moderate-sized ex vacuo pneumothorax without tension component. There is residual density inferiorly in the incompletely expanded right lung. Mild patchy left basilar atelectasis and left pleural thickening. Stable cardiomegaly and aortic atherosclerosis post median sternotomy and CABG. IMPRESSION: Near complete evacuation of the previously demonstrated right pleural effusion following PleurX catheter placement. Moderate-sized ex vacuo pneumothorax without tension component. Electronically Signed   By: Carey Bullocks M.D.   On: 10/29/2023 15:34   DG C-Arm 1-60  Min-No Report  Result Date: 10/29/2023 Fluoroscopy was utilized by the requesting physician.  No radiographic interpretation.    Assessment/Plan: S/P Procedure(s) (LRB): INSERTION PLEURAL DRAINAGE CATHETER (Right) POD#1  1 afeb, VSS 2 sats ok on RA 3 no UOP- ESRD- dialysis today 4 H/H pretty stable, minimal blood loss for this procedure 5 appears pretty stable, home after dialysis   LOS: 1 day    Rowe Clack PA-C Pager [phone] 10/30/2023

## 2023-10-30 NOTE — Progress Notes (Signed)
   10/30/23 1927  Vitals  Temp 98.4 F (36.9 C)  Pulse Rate 74  Resp 20  BP 109/60  SpO2 95 %  O2 Device Nasal Cannula  Oxygen Therapy  Pulse Oximetry Type Continuous  O2 Flow Rate (L/min) 3 L/min  Post Treatment  Dialyzer Clearance Clear  Hemodialysis Intake (mL) 0 mL  Liters Processed 72  Fluid Removed (mL) 2000 mL  Tolerated HD Treatment Yes  Post-Hemodialysis Comments Pt tolerated HD tx without difficulties. Pt.c/o Back pain of 8 and medicated pt with oxycodone 5mg  oral. Report was called to 4E Bedside RN  AVG/AVF Arterial Site Held (minutes) 5 minutes  AVG/AVF Venous Site Held (minutes) 5 minutes   Received patient in bed to unit.  Alert and oriented.  Informed consent signed and in chart.   TX duration: 3  Patient tolerated well.  Transported back to the room  Alert, without acute distress.  Hand-off given to patient's nurse.   Access used: Yes Access issues: No  Total UF removed: 2000 Medication(s) given: See MAR Post HD VS: See Above Grid Post HD weight: 72.5 kg   [facility]

## 2023-10-30 NOTE — TOC Transition Note (Signed)
Transition of Care [facility]) - CM/SW Discharge Note   Patient Details  Name: John [name] MRN: [id number] Date of Birth: Jul 25, 1951  Transition of Care [facility]) CM/SW Contact:  Ronny Bacon, RN Phone Number: 10/30/2023, 9:53 AM   Clinical Narrative:   Patient is being discharged today. Spoke with patient by phone, explained MOON letter. Patient confirmed that his wife will pick him up when he is discharged.    Final next level of care: Home/Self Care Barriers to Discharge: No Barriers Identified   Patient Goals and CMS Choice      Discharge Placement                         Discharge Plan and Services Additional resources added to the After Visit Summary for                                       Social Determinants of Health (SDOH) Interventions SDOH Screenings   Tobacco Use: Medium Risk (10/29/2023)     Readmission Risk Interventions     No data to display

## 2023-10-30 NOTE — Care Management Obs Status (Signed)
MEDICARE OBSERVATION STATUS NOTIFICATION   Patient Details  Name: ROBT GUNNELL MRN: [id number] Date of Birth: 1951/05/10   Medicare Observation Status Notification Given:  Yes    Ronny Bacon, RN 10/30/2023, 9:52 AM

## 2023-10-30 NOTE — Care Management (Signed)
Completed PleurX order and faxed it along with insurance cards and demographics to [employer].  Provided copy of order, insurance cards, and demographics to CMA to mail to [place] on Monday. Left originals with Donn Pierini RNCM, weekday 4E CM incase any issues arise.  Instructed unit secretary to order box of 10 to be sent home with patient, updated bedside nurse.

## 2023-10-30 NOTE — Progress Notes (Signed)
[facility] Progress Note   Subjective:    Seen and examined patient at bedside. S/p R PleurX 12/6 by Dr. Donata Clay for recurrent R pleural effusions. Plan for HD sometime today. We have several emergency cases and will try to dialyze him as soon as we can.  Objective Vitals:   10/30/23 0250 10/30/23 0559 10/30/23 0825 10/30/23 1135  BP: (!) 106/59  112/61 (!) 99/59  Pulse: 74  74 67  Resp: 13  16 18   Temp: 98 F (36.7 C)  98.4 F (36.9 C)   TempSrc: Oral  Oral Oral  SpO2: 94%  92% 100%  Weight:  74.7 kg    Height:       Physical Exam General: Awake, alert, pleasant, on RA, NAD Heart: S1 and S2; No MRGs Lungs: Diminished throughout Abdomen: Soft and non-tender Extremities: No LE edema Dialysis Access: R AVF (+) B/T   Filed Weights   10/29/23 0944 10/30/23 0559  Weight: 76.7 kg 74.7 kg    Intake/Output Summary (Last 24 hours) at 10/30/2023 1151 Last data filed at 10/30/2023 0735 Gross per 24 hour  Intake 820 ml  Output 1500 ml  Net -680 ml    Additional Objective Labs: Basic Metabolic Panel: Recent Labs  Lab 10/29/23 0932 10/30/23 0243  NA 135 133*  K 3.3* 3.6  CL 93* 92*  CO2 27 28  GLUCOSE 94 110*  BUN 12 16  CREATININE 3.74* 4.43*  CALCIUM 10.0 9.4   Liver Function Tests: Recent Labs  Lab 10/29/23 0932  AST 15  ALT 13  ALKPHOS 114  BILITOT 2.0*  PROT 6.3*  ALBUMIN 2.9*   No results for input(s): "LIPASE", "AMYLASE" in the last 168 hours. CBC: Recent Labs  Lab 10/29/23 0932 10/30/23 0243  WBC 5.0 4.3  HGB 12.3* 11.1*  HCT 39.6 34.2*  MCV 97.1 94.7  PLT 95* 91*   Blood Culture    Component Value Date/Time   SDES EXPECTORATED SPUTUM 02/20/2023 1524   SPECREQUEST NONE 02/20/2023 1524   CULT  02/19/2023 1506    NO GROWTH 5 DAYS Performed at [facility], [address] [place], [place] [address]    REPTSTATUS 02/23/2023 FINAL 02/20/2023 1524    Cardiac Enzymes: No results for input(s): "CKTOTAL", "CKMB",  "CKMBINDEX", "TROPONINI" in the last 168 hours. CBG: Recent Labs  Lab 10/29/23 0936 10/29/23 1138 10/29/23 1338  GLUCAP 86 76 82   Iron Studies: No results for input(s): "IRON", "TIBC", "TRANSFERRIN", "FERRITIN" in the last 72 hours. Lab Results  Component Value Date   INR 1.2 10/29/2023   INR 1.1 02/15/2020   INR 1.4 (H) 03/29/2019   Studies/Results: DG Chest Port 1 View  Result Date: 10/29/2023 CLINICAL DATA:  Pleural drainage catheter placement for pleural effusion. EXAM: PORTABLE CHEST 1 VIEW COMPARISON:  Radiographs 10/25/2023 and 10/13/2023.  CT 09/29/2023. FINDINGS: 1412 hours. PleurX catheter has been placed inferiorly in the right pleural space with near complete evacuation of the previously demonstrated right pleural effusion. Moderate-sized ex vacuo pneumothorax without tension component. There is residual density inferiorly in the incompletely expanded right lung. Mild patchy left basilar atelectasis and left pleural thickening. Stable cardiomegaly and aortic atherosclerosis post median sternotomy and CABG. IMPRESSION: Near complete evacuation of the previously demonstrated right pleural effusion following PleurX catheter placement. Moderate-sized ex vacuo pneumothorax without tension component. Electronically Signed   By: Carey Bullocks M.D.   On: 10/29/2023 15:34   DG C-Arm 1-60 Min-No Report  Result Date: 10/29/2023  Fluoroscopy was utilized by the requesting physician.  No radiographic interpretation.    Medications:  sodium chloride Stopped (10/30/23 1021)    acetaminophen  1,000 mg Oral Q6H   aspirin EC  81 mg Oral Daily   atorvastatin  40 mg Oral Daily   Chlorhexidine Gluconate Cloth  6 each Topical Q0600   cinacalcet  30 mg Oral Q supper   escitalopram  10 mg Oral Daily   gabapentin  100 mg Oral Daily   midodrine  10 mg Oral Daily   multivitamin  1 tablet Oral QHS   mupirocin ointment  1 Application Nasal BID   pantoprazole  40 mg Oral Q1200   rOPINIRole   0.5 mg Oral QHS   sevelamer carbonate  800 mg Oral TID WC   sodium chloride flush  3 mL Intravenous Q12H   tamsulosin  0.4 mg Oral Daily   torsemide  100 mg Oral Daily    Dialysis Orders: TTS - [facility] 4hrs, BFR 400, DFR Auto 1.5,  EDW 75kg, 2K/ 2Ca Heparin: None Mircera: None Calcitriol 0.19mcg PO qHD -last 12/5   Assessment/Plan: Recurrent R-sided pleural effusions - Followed by Cardiothoracic Surgery, s/p R pleural drainage catheter 12/6 by Dr. Maren Beach ESRD - on HD TTS, plan for HD today. We have several emergency cases today so will try to dialyze him as soon as we can. I discussed this with bedside RN to keep patient and wife updated. K+ 3.6, will use 3K bath. Hypertension/volume  - Euvolemic on exam and Bps currently soft but stable. Anemia of CKD - Hgb 11.1. No ESA/Fe is indicated at this time. Secondary Hyperparathyroidism -  Will check phos in AM, Corr Ca elevated, hold VDRA for now Nutrition - Renal diet with fluid restriction  Salome Holmes, NP [facility] 10/30/2023,11:51 AM  LOS: 1 day

## 2023-10-31 NOTE — Discharge Planning (Signed)
[facility] Patient Discharge Orders- [facility] CLINIC: [facility]  Patient's name: John [name] Admit/DC Dates: 10/29/2023 - 10/30/2023  Discharge Diagnoses: Recurrent R pleural effusions/SOB - s/p pleural catheter placement 12/6 by Dr. Maren Beach (Cardiothoracic surgery)     Aranesp: Given: No    Last Hgb: 11.1 PRBC's Given: No  ESA dose for discharge: N/A  Heparin change: N/A  EDW Change: Yes New EDW: 74kg-Leaving under here.   Bath Change: No  Access intervention/Change: No   Calcitriol change: No   Discharge Labs: Calcium 9.4  K+ 3.6  IV Antibiotics: No  On Coumadin?: No   OTHER/APPTS/LAB ORDERS:    D/C Meds to be reconciled by nurse after every discharge.  Completed By: Salome Holmes, NP   Reviewed by: MD:______ RN_______

## 2023-11-01 NOTE — Discharge Summary (Cosign Needed)
Physician Discharge Summary       [address] [place]       [place] [address]             [phone]    Patient ID: John [name] MRN: [id number] DOB/AGE: [birth year] 72 y.o.  Admit date: 10/29/2023 Discharge date: 10/30/2023  Admission Diagnoses: Recurrent right oral effusion  Discharge Diagnoses:  Principal Problem:   Recurrent right pleural effusion HPI: Patient examined, images of most recent echocardiogram and chest x-ray personally reviewed and counseled patient.   72 year old male status post CABG x 4 about 5 years ago presents with end-stage renal failure, heart failure, EF 30% with MR, TR and pulmonary hypertension and recurrent symptomatic right pleural effusion.  The patient has had right thoracentesis for symptomatic pleural effusion requiring hospitalization twice at [facility]  He is starting to have recurrent symptoms of shortness of breath and fullness despite full dialysis.  Chest x-ray today shows reaccumulating right pleural effusion, status post sternotomy and CABG.   The patient has recently been placed on home oxygen 2 to 3 L/min to assist with his shortness of breath from recurrent pleural effusion and heart failure.   The patient is in sinus rhythm and is on no anticoagulation other than aspirin.  He is a type II diabetic and is allergic penicillin.   He has had no previous surgery or trauma to the right chest.   He has right arm AV fistula for HD access  Hospital course:  The patient was admitted electively on 10/29/2023 and taken to the operating room at which time he underwent placement of a right Pleurx catheter as well as drainage of the right pleural effusion.  He tolerated the procedure well and was taken to the postanesthesia care unit in stable condition.  Postoperative hospital course:  The patient has remained stable.  Supplies were obtained for him to do the Pleurx catheter drainage at home and the nursing staff  instructed the patient on the procedure.  On postop day 1 he did have dialysis prior to discharge.  He was seen in consultation by the nephrology service who managed his end-stage renal disease issues while hospitalized.  He otherwise remained stable and was felt to be okay for discharge on 10/30/2023.    Consults: nephrology  Procedure (s):   Operative Report    DATE OF PROCEDURE: 10/29/2023   OPERATION:  Placement of right PleurX catheter and drainage of right pleural effusion.   SURGEON:  Kerin Perna III, MD   PREOPERATIVE DIAGNOSES: 1.  History of heart failure with recurrent right pleural effusion. 2.  Requiring thoracentesis x2.   POSTOPERATIVE DIAGNOSES: 1.  History of heart failure with recurrent right pleural effusion. 2.  Requiring thoracentesis x2.   ANESTHESIA:  MAC with 1% local lidocaine and monitored IV conscious sedation.      Latest Vital Signs: Blood pressure 109/60, pulse 74, temperature 98.4 F (36.9 C), resp. rate 20, height 6\' 2"  (1.88 m), weight (S) 72.5 kg, SpO2 95%.  Physical Exam: General appearance: alert, cooperative, fatigued, and no distress Heart: regular rate and rhythm Lungs: clear to auscultation bilaterally Abdomen: benign Extremities: no edema  Discharge Condition:fair  Recent laboratory studies:  Lab Results  Component Value Date   WBC 4.3 10/30/2023   HGB 11.1 (L) 10/30/2023   HCT 34.2 (L) 10/30/2023   MCV 94.7 10/30/2023   PLT 91 (L) 10/30/2023   Lab Results  Component Value Date   NA 133 (L) 10/30/2023  K 3.6 10/30/2023   CL 92 (L) 10/30/2023   CO2 28 10/30/2023   CREATININE 4.43 (H) 10/30/2023   GLUCOSE 110 (H) 10/30/2023      Diagnostic Studies: DG Chest 2 View  Result Date: 10/29/2023 CLINICAL DATA:  Shortness of breath. Right pleural effusion suspected. EXAM: CHEST - 2 VIEW COMPARISON:  Radiographs 09/29/2023 and 09/14/2023.  CT 09/29/2023. FINDINGS: The heart is enlarged, and there is diffuse coronary and  aortic atherosclerosis post median sternotomy and CABG. A moderate-sized right pleural effusion has enlarged with increasing compressive right basilar atelectasis. Allowing for incomplete visualization of the left costophrenic angle on the frontal examination, no significant left pleural effusion. There is vascular congestion without confluent airspace disease. No pneumothorax. No acute osseous findings are evident. There are degenerative changes in the spine. Previous cervical fusion. IMPRESSION: 1. Enlarging moderate-sized right pleural effusion with increasing compressive right basilar atelectasis. 2. Cardiomegaly with vascular congestion. 3. Aortic atherosclerosis. Electronically Signed   By: Carey Bullocks M.D.   On: 10/29/2023 15:37   DG Chest Port 1 View  Result Date: 10/29/2023 CLINICAL DATA:  Pleural drainage catheter placement for pleural effusion. EXAM: PORTABLE CHEST 1 VIEW COMPARISON:  Radiographs 10/25/2023 and 10/13/2023.  CT 09/29/2023. FINDINGS: 1412 hours. PleurX catheter has been placed inferiorly in the right pleural space with near complete evacuation of the previously demonstrated right pleural effusion. Moderate-sized ex vacuo pneumothorax without tension component. There is residual density inferiorly in the incompletely expanded right lung. Mild patchy left basilar atelectasis and left pleural thickening. Stable cardiomegaly and aortic atherosclerosis post median sternotomy and CABG. IMPRESSION: Near complete evacuation of the previously demonstrated right pleural effusion following PleurX catheter placement. Moderate-sized ex vacuo pneumothorax without tension component. Electronically Signed   By: Carey Bullocks M.D.   On: 10/29/2023 15:34   DG C-Arm 1-60 Min-No Report  Result Date: 10/29/2023 Fluoroscopy was utilized by the requesting physician.  No radiographic interpretation.   DG Chest 2 View  Result Date: 10/25/2023 CLINICAL DATA:  Right pleural effusion, shortness of  breath EXAM: CHEST - 2 VIEW COMPARISON:  10/13/2023 FINDINGS: Similar chest radiograph. Gross cardiomegaly status post median sternotomy and CABG. Large right pleural effusion and associated atelectasis or consolidation. Trace left pleural effusion. Mild diffuse interstitial opacity. No acute osseous findings. IMPRESSION: 1. Large right pleural effusion and associated atelectasis or consolidation. Trace left pleural effusion. 2. Mild diffuse interstitial opacity, consistent with edema. 3. Gross cardiomegaly status post median sternotomy and CABG. Electronically Signed   By: Jearld Lesch M.D.   On: 10/25/2023 14:26       Discharge Instructions     Discharge patient   Complete by: As directed    Discharge disposition: 01-Home or Self Care   Discharge patient date: 10/29/2023   Discharge patient   Complete by: As directed    After dialysis treatment today   Discharge disposition: 01-Home or Self Care   Discharge patient date: 10/30/2023       Discharge Medications: Allergies as of 10/30/2023       Reactions   Penicillins Rash, Other (See Comments)   Did it involve swelling of the face/tongue/throat, SOB, or low BP? No Did it involve sudden or severe rash/hives, skin peeling, or any reaction on the inside of your mouth or nose? Yes Did you need to seek medical attention at a hospital or doctor's office? Yes When did it last happen?      Childhood allergy If all above answers are "NO", may proceed with  cephalosporin use.        Medication List     TAKE these medications    (feeding supplement) PROSource Plus liquid Take 30 mLs by mouth 2 (two) times daily between meals.   acetaminophen 325 MG tablet Commonly known as: TYLENOL Take 650 mg by mouth every 6 (six) hours as needed for moderate pain.   aspirin EC 81 MG tablet Take 81 mg by mouth daily.   atorvastatin 40 MG tablet Commonly known as: LIPITOR Take 40 mg by mouth at bedtime.   b complex-vitamin c-folic acid 0.8 MG  Tabs tablet Take 1 tablet by mouth at bedtime.   escitalopram 10 MG tablet Commonly known as: LEXAPRO Take 10 mg by mouth daily.   fluticasone 50 MCG/ACT nasal spray Commonly known as: FLONASE Place 1 spray into both nostrils daily as needed for allergies or rhinitis.   gabapentin 100 MG capsule Commonly known as: NEURONTIN Take 2 capsules (200 mg total) by mouth at bedtime.   ibuprofen 200 MG tablet Commonly known as: ADVIL Take 200 mg by mouth daily as needed for moderate pain.   ketoconazole 2 % shampoo Commonly known as: NIZORAL Apply 1 Application topically 3 (three) times a week.   midodrine 10 MG tablet Commonly known as: PROAMATINE 3 TIMES A DAY ON TUESDAY, THURSDAY AND SATURDAY   oxyCODONE 5 MG immediate release tablet Commonly known as: Oxy IR/ROXICODONE Take 1 tablet (5 mg total) by mouth every 6 (six) hours as needed for up to 3 days for moderate pain (pain score 4-6).   pantoprazole 40 MG tablet Commonly known as: PROTONIX TAKE 1 TABLET BY MOUTH EVERY DAY   Renvela 800 MG tablet Generic drug: sevelamer carbonate Take 1,600 mg by mouth 3 (three) times daily with meals.   rOPINIRole 0.5 MG tablet Commonly known as: REQUIP Take 1 mg by mouth at bedtime.   Sensipar 30 MG tablet Generic drug: cinacalcet Take 30 mg by mouth daily with supper.   tamsulosin 0.4 MG Caps capsule Commonly known as: FLOMAX Take 0.4 mg by mouth daily.   torsemide 100 MG tablet Commonly known as: DEMADEX Take 150 mg by mouth daily.   zolpidem 10 MG tablet Commonly known as: AMBIEN Take 10 mg by mouth at bedtime.        Follow Up Appointments:  Follow-up Information     Lovett Sox, MD Follow up.   Specialty: Cardiothoracic Surgery Why: see discharge paperwork for detaila of date and time Contact information: [address] [place] [address] [phone]                 Signed: Noel Christmas 11/01/2023, 2:17 PM  patient  examined and medical record reviewed,agree with above note. Lovett Sox 11/04/2023

## 2023-11-02 ENCOUNTER — Telehealth: Payer: Self-pay | Admitting: *Deleted

## 2023-11-02 NOTE — Telephone Encounter (Signed)
Patient's palliative care RN called yesterday inquiring about [facility] services for pleurx management and care. Contacted patient's wife who stated she was told HH would be coming out to assist but she has yet to hear from anyone. Contacted TOC nurse from hospital who stated services were never ordered by provider. Discussed with patient's wife. Wife states she was educated at discharge on how to drain catheter. Wife felt comfortable to try on her own. Called wife this morning to check on patient. States she drained patient's catheter and got out. Advised wife multiple HH agencies were contacted and none were able to take a pleurx patient. Advised wife to drain again on Wednesday and to call if she runs into any issues. Patient will be seen in clinic on Friday by Dr. Donata Clay.

## 2023-11-04 ENCOUNTER — Other Ambulatory Visit: Payer: Self-pay | Admitting: Cardiothoracic Surgery

## 2023-11-04 DIAGNOSIS — J90 Pleural effusion, not elsewhere classified: Secondary | ICD-10-CM

## 2023-11-04 NOTE — Progress Notes (Signed)
cx

## 2023-11-05 ENCOUNTER — Ambulatory Visit: Payer: Medicare HMO | Admitting: Physician Assistant

## 2023-11-05 ENCOUNTER — Ambulatory Visit
Admission: RE | Admit: 2023-11-05 | Discharge: 2023-11-05 | Disposition: A | Discharge status: Home / Self Care | Payer: Medicare HMO | Source: Ambulatory Visit | Attending: Cardiothoracic Surgery | Admitting: Cardiothoracic Surgery

## 2023-11-05 ENCOUNTER — Encounter: Payer: Self-pay | Admitting: Physician Assistant

## 2023-11-05 VITALS — BP 117/57 | HR 82 | Resp 20 | Wt 162.4 lb

## 2023-11-05 DIAGNOSIS — I509 Heart failure, unspecified: Secondary | ICD-10-CM

## 2023-11-05 DIAGNOSIS — J90 Pleural effusion, not elsewhere classified: Secondary | ICD-10-CM

## 2023-11-05 NOTE — Progress Notes (Signed)
HPI: John [name] is a 72 year old gentleman with a past history of type 2 diabetes, end-stage renal disease on hemodialysis 3 days a week, coronary artery disease status post CABG x 4 about 5 years ago, chronic heart failure with reduced ejection fraction this is LVEF 30%) with mitral insufficiency, tricuspid insufficiency, and pulmonary hypertension.  He has had a recurrent symptomatic right pleural effusion.  He had placement of a right Pleurx catheter by Dr. Maren Beach 1 week ago as an outpatient. He returns today for scheduled follow-up with chest x-ray   Since hospital discharge John [name]  said she had drained it twice at home with out on 12/9 and out on 12/11. She has received 2 shipments of drainage supplies and said she was well-stocked with the kits.   John [name] has fallen a couple of times at home since this past week and has some abrasions on his right arm and on the knuckles of his right hand. His breathing is about the same he said.    Current Outpatient Medications  Medication Sig Dispense Refill   acetaminophen (TYLENOL) 325 MG tablet Take 650 mg by mouth every 6 (six) hours as needed for moderate pain.     aspirin EC 81 MG tablet Take 81 mg by mouth daily.     atorvastatin (LIPITOR) 40 MG tablet Take 40 mg by mouth at bedtime.     b complex-vitamin c-folic acid (NEPHRO-VITE) 0.8 MG TABS tablet Take 1 tablet by mouth at bedtime.     escitalopram (LEXAPRO) 10 MG tablet Take 10 mg by mouth daily.     fluticasone (FLONASE) 50 MCG/ACT nasal spray Place 1 spray into both nostrils daily as needed for allergies or rhinitis.     gabapentin (NEURONTIN) 100 MG capsule Take 2 capsules (200 mg total) by mouth at bedtime.     ibuprofen (ADVIL) 200 MG tablet Take 200 mg by mouth daily as needed for moderate pain.     ketoconazole (NIZORAL) 2 % shampoo Apply 1 Application topically 3 (three) times a week.     midodrine (PROAMATINE) 10 MG tablet 3 TIMES A DAY ON  TUESDAY, THURSDAY AND SATURDAY 135 tablet 1   Nutritional Supplements (,FEEDING SUPPLEMENT, PROSOURCE PLUS) liquid Take 30 mLs by mouth 2 (two) times daily between meals.     pantoprazole (PROTONIX) 40 MG tablet TAKE 1 TABLET BY MOUTH EVERY DAY 90 tablet 1   RENVELA 800 MG tablet Take 1,600 mg by mouth 3 (three) times daily with meals.     rOPINIRole (REQUIP) 0.5 MG tablet Take 1 mg by mouth at bedtime.     SENSIPAR 30 MG tablet Take 30 mg by mouth daily with supper.     tamsulosin (FLOMAX) 0.4 MG CAPS capsule Take 0.4 mg by mouth daily.     torsemide (DEMADEX) 100 MG tablet Take 150 mg by mouth daily.     zolpidem (AMBIEN) 10 MG tablet Take 10 mg by mouth at bedtime.     No current facility-administered medications for this visit.    Physical Exam Vital signs BP 117/57 Pulse 82 Respirations 20 SpO2 88% on room air  General: John [name] walked into the office using a cane.  He is gait is slow but reasonably stable.  His wife was assisting him today Heart: Regular rate and rhythm, has murmurs with both systolic and diastolic components. Chest: Well-healed sternotomy incision.  Breath sounds were diminished on the right both before and after drainage of the Pleurx  today.  Breath sounds are full and clear on the left.  The chest x-ray has not been read but the images were reviewed.  He had a moderate-sized hydropneumothorax on the right on the right.  Pleurx catheter appears to be well positioned below in the right hemithorax.  The left lung is clear Extremities: He has no peripheral edema   Diagnostic Tests:  CLINICAL DATA:  Right PleurX catheter. Increasing shortness of breath.   EXAM: CHEST - 2 VIEW   COMPARISON:  Radiographs 10/29/2023 and 10/25/2023.  CT 09/29/2023.   FINDINGS: Right PleurX catheter appears grossly unchanged in position, projecting over the posteroinferior aspect of the right pleural space. Compared with the most recent prior study, there has been some  reaccumulation of pleural fluid on the right. The overall volume of the right-sided hydropneumothorax is unchanged, with persistent incomplete re-expansion of the right lung. No tension component identified.   The heart size and mediastinal contours are stable status post median sternotomy and CABG. There is aortic atherosclerosis. The left lung is clear. No significant left-sided pleural effusion. The bones appear unchanged status post median sternotomy and lower cervical fusion.   IMPRESSION: Slight reaccumulation of pleural fluid on the right compared with the most recent prior study. The overall volume of the right-sided hydropneumothorax is unchanged, with persistent incomplete re-expansion of the right lung. No tension component identified.     Electronically Signed   By: Carey Bullocks M.D.   On: 11/05/2023 11:20  Impression / Plan: John [name] 1 week post placement of right Pleurx catheter for recurrent pleural effusion in the setting of end-stage renal disease on hemodialysis, pulmonary hypertension, tricuspid insufficiency, and mitral insufficiency.  Pleurx catheter was drained in the office today yielding about 450 mL thin serosanguineous fluid.  Nylon sutures were removed from the counterincision.  Placement of catheter.  The exit site appeared healthy and the Pleurx overall function appropriately.  John [name] wife is performing drainage procedure at home with assistance from another family member.  They have adequate supplies.  Plan continue Pleurx drainage every other day and follow-up with Dr. Maren Beach Monday, December 23. I assured Mr. and John [name] we will be happy to assist him if any problems arise in the interim.  John Roca, PA-C [facility] [phone]

## 2023-11-05 NOTE — Patient Instructions (Signed)
Continue drainage of the Pleurx catheter every other day . Follow-up with Dr. Maren Beach on Monday, 11/15/2023

## 2023-11-15 ENCOUNTER — Other Ambulatory Visit: Payer: Self-pay

## 2023-11-15 ENCOUNTER — Ambulatory Visit (INDEPENDENT_AMBULATORY_CARE_PROVIDER_SITE_OTHER): Payer: Medicare HMO | Admitting: Cardiothoracic Surgery

## 2023-11-15 ENCOUNTER — Encounter: Payer: Self-pay | Admitting: Cardiothoracic Surgery

## 2023-11-15 VITALS — BP 115/74 | Ht 74.0 in | Wt 160.0 lb

## 2023-11-15 DIAGNOSIS — J90 Pleural effusion, not elsewhere classified: Secondary | ICD-10-CM

## 2023-11-15 DIAGNOSIS — Z9889 Other specified postprocedural states: Secondary | ICD-10-CM | POA: Diagnosis not present

## 2023-11-15 DIAGNOSIS — R0602 Shortness of breath: Secondary | ICD-10-CM

## 2023-11-15 NOTE — Progress Notes (Signed)
HPI: Frail 72 year old patient on dialysis and with CHF, tricuspid regurgitation, and recurrent right pleural effusion returns for scheduled visit.  He had a right Pleurx catheter placed 1 month ago.  He is on a drainage schedule of every other day Monday Wednesday Friday.  Drainage is his been around 400 cc of serosanguineous fluid.  No problems with the catheter draining.  Surgical incision is healing well.  Sutures have been removed at the insertion site.  Today using sterile technique the right Pleurx was drained of 450 cc of serosanguineous fluid.  Procedure was tolerated well.  Going forward the patient will continue every other day drainage schedule.  The family has enough kits delivered by the home health agency.  Current Outpatient Medications  Medication Sig Dispense Refill   acetaminophen (TYLENOL) 325 MG tablet Take 650 mg by mouth every 6 (six) hours as needed for moderate pain.     aspirin EC 81 MG tablet Take 81 mg by mouth daily.     atorvastatin (LIPITOR) 40 MG tablet Take 40 mg by mouth at bedtime.     b complex-vitamin c-folic acid (NEPHRO-VITE) 0.8 MG TABS tablet Take 1 tablet by mouth at bedtime.     escitalopram (LEXAPRO) 10 MG tablet Take 10 mg by mouth daily.     fluticasone (FLONASE) 50 MCG/ACT nasal spray Place 1 spray into both nostrils daily as needed for allergies or rhinitis.     gabapentin (NEURONTIN) 100 MG capsule Take 2 capsules (200 mg total) by mouth at bedtime.     ibuprofen (ADVIL) 200 MG tablet Take 200 mg by mouth daily as needed for moderate pain.     ketoconazole (NIZORAL) 2 % shampoo Apply 1 Application topically 3 (three) times a week.     midodrine (PROAMATINE) 10 MG tablet 3 TIMES A DAY ON TUESDAY, THURSDAY AND SATURDAY 135 tablet 1   Nutritional Supplements (,FEEDING SUPPLEMENT, PROSOURCE PLUS) liquid Take 30 mLs by mouth 2 (two) times daily between meals.     pantoprazole (PROTONIX) 40 MG tablet TAKE 1 TABLET BY MOUTH EVERY DAY 90 tablet 1    RENVELA 800 MG tablet Take 1,600 mg by mouth 3 (three) times daily with meals.     rOPINIRole (REQUIP) 0.5 MG tablet Take 1 mg by mouth at bedtime.     SENSIPAR 30 MG tablet Take 30 mg by mouth daily with supper.     tamsulosin (FLOMAX) 0.4 MG CAPS capsule Take 0.4 mg by mouth daily.     torsemide (DEMADEX) 100 MG tablet Take 150 mg by mouth daily.     zolpidem (AMBIEN) 10 MG tablet Take 10 mg by mouth at bedtime.     No current facility-administered medications for this visit.     Physical Exam: Blood pressure 115/74, height 6\' 2"  (1.88 m), weight 160 lb (72.6 kg), SpO2 93%.  Alert and responsive but appears weak and frail which is baseline Diminished breath sounds bilaterally Right Pleurx insertion site clean and dry 2+ lower extremity edema Generally weak, needs assistance getting out of wheelchair to the exam table and back.  450 mL serosanguineous fluid drained today from the Pleurx catheter.  Catheter capped and sterile dressing reapplied.  Diagnostic Tests: None  Impression: Patient's symptomatic shortness of breath mild to moderately improved with Pleurx catheter drainage.  However there is underlying entrapment-restriction of the right lower lobe from chronic atelectasis.  The patient is not a candidate for VATS/decortication.  Continue with current Pleurx catheter drainage schedule.  Plan: Patient  to return in 2 weeks to assess the Pleurx catheter issues.  No x-ray will be needed.   Lovett Sox, MD [facility] [phone]

## 2023-11-24 DEATH — deceased

## 2023-11-29 ENCOUNTER — Ambulatory Visit: Payer: Medicare HMO | Admitting: Cardiothoracic Surgery
